# Patient Record
Sex: Male | Born: 1935
Health system: Southern US, Community
[De-identification: ages and names within clinical notes are randomized; demographics above are authoritative.]

## PROBLEM LIST (undated history)

## (undated) DIAGNOSIS — R413 Other amnesia: Secondary | ICD-10-CM

## (undated) DIAGNOSIS — I442 Atrioventricular block, complete: Secondary | ICD-10-CM

## (undated) DIAGNOSIS — J342 Deviated nasal septum: Secondary | ICD-10-CM

## (undated) DIAGNOSIS — Z952 Presence of prosthetic heart valve: Secondary | ICD-10-CM

## (undated) DIAGNOSIS — I350 Nonrheumatic aortic (valve) stenosis: Secondary | ICD-10-CM

## (undated) DIAGNOSIS — Z950 Presence of cardiac pacemaker: Secondary | ICD-10-CM

## (undated) DIAGNOSIS — E049 Nontoxic goiter, unspecified: Secondary | ICD-10-CM

## (undated) DIAGNOSIS — M1990 Unspecified osteoarthritis, unspecified site: Secondary | ICD-10-CM

## (undated) DIAGNOSIS — I4819 Other persistent atrial fibrillation: Secondary | ICD-10-CM

## (undated) DIAGNOSIS — E7800 Pure hypercholesterolemia, unspecified: Secondary | ICD-10-CM

## (undated) DIAGNOSIS — R251 Tremor, unspecified: Secondary | ICD-10-CM

## (undated) HISTORY — PX: TONSILLECTOMY: SUR1361

## (undated) HISTORY — DX: Deviated nasal septum: J34.2

## (undated) HISTORY — DX: Other persistent atrial fibrillation: I48.19

## (undated) HISTORY — DX: Nontoxic goiter, unspecified: E04.9

## (undated) HISTORY — DX: Pure hypercholesterolemia, unspecified: E78.00

## (undated) HISTORY — PX: CATARACT EXTRACTION W/ INTRAOCULAR LENS  IMPLANT, BILATERAL: SHX1307

## (undated) HISTORY — DX: Nonrheumatic aortic (valve) stenosis: I35.0

## (undated) HISTORY — PX: WISDOM TOOTH EXTRACTION: SHX21

## (undated) HISTORY — DX: Tremor, unspecified: R25.1

## (undated) HISTORY — DX: Other amnesia: R41.3

---

## 2004-12-08 ENCOUNTER — Ambulatory Visit (HOSPITAL_COMMUNITY): Admission: RE | Admit: 2004-12-08 | Discharge: 2004-12-08 | Payer: Self-pay | Admitting: Gastroenterology

## 2004-12-10 ENCOUNTER — Ambulatory Visit (HOSPITAL_COMMUNITY): Admission: RE | Admit: 2004-12-10 | Discharge: 2004-12-10 | Payer: Self-pay | Admitting: Internal Medicine

## 2004-12-29 ENCOUNTER — Other Ambulatory Visit: Admission: RE | Admit: 2004-12-29 | Discharge: 2004-12-29 | Payer: Self-pay | Admitting: Interventional Radiology

## 2004-12-29 ENCOUNTER — Encounter: Admission: RE | Admit: 2004-12-29 | Discharge: 2004-12-29 | Payer: Self-pay | Admitting: Internal Medicine

## 2004-12-29 ENCOUNTER — Encounter (INDEPENDENT_AMBULATORY_CARE_PROVIDER_SITE_OTHER): Payer: Self-pay | Admitting: *Deleted

## 2006-01-10 ENCOUNTER — Encounter: Admission: RE | Admit: 2006-01-10 | Discharge: 2006-01-10 | Payer: Self-pay | Admitting: Internal Medicine

## 2007-09-20 ENCOUNTER — Encounter: Admission: RE | Admit: 2007-09-20 | Discharge: 2007-09-20 | Payer: Self-pay | Admitting: Internal Medicine

## 2009-03-12 ENCOUNTER — Encounter: Admission: RE | Admit: 2009-03-12 | Discharge: 2009-03-12 | Payer: Self-pay | Admitting: Internal Medicine

## 2009-10-24 ENCOUNTER — Encounter: Admission: RE | Admit: 2009-10-24 | Discharge: 2009-10-24 | Payer: Self-pay | Admitting: Internal Medicine

## 2009-11-11 ENCOUNTER — Other Ambulatory Visit: Admission: RE | Admit: 2009-11-11 | Discharge: 2009-11-11 | Payer: Self-pay | Admitting: Interventional Radiology

## 2009-11-11 ENCOUNTER — Encounter: Admission: RE | Admit: 2009-11-11 | Discharge: 2009-11-11 | Payer: Self-pay | Admitting: Endocrinology

## 2009-11-24 ENCOUNTER — Encounter: Admission: RE | Admit: 2009-11-24 | Discharge: 2009-11-24 | Payer: Self-pay | Admitting: Internal Medicine

## 2010-10-24 ENCOUNTER — Other Ambulatory Visit: Payer: Self-pay | Admitting: Internal Medicine

## 2010-10-24 DIAGNOSIS — E049 Nontoxic goiter, unspecified: Secondary | ICD-10-CM

## 2010-10-25 ENCOUNTER — Encounter: Payer: Self-pay | Admitting: Internal Medicine

## 2010-11-09 ENCOUNTER — Inpatient Hospital Stay: Admission: RE | Admit: 2010-11-09 | Payer: Self-pay | Source: Ambulatory Visit

## 2010-11-20 ENCOUNTER — Ambulatory Visit
Admission: RE | Admit: 2010-11-20 | Discharge: 2010-11-20 | Disposition: A | Discharge status: Home / Self Care | Payer: Medicare Other | Source: Ambulatory Visit | Attending: Internal Medicine | Admitting: Internal Medicine

## 2010-11-20 DIAGNOSIS — E049 Nontoxic goiter, unspecified: Secondary | ICD-10-CM

## 2011-02-19 NOTE — Op Note (Signed)
NAMEJUNAID, Jeffery Rogers               ACCOUNT NO.:  [id number]   MEDICAL RECORD NO.:  [id number]          PATIENT TYPE:  AMB   LOCATION:  ENDO                         FACILITY:  [facility]   PHYSICIAN:  Petra Kuba, M.D.    DATE OF BIRTH:  [birth year]   DATE OF PROCEDURE:  12/08/2004  DATE OF DISCHARGE:                                 OPERATIVE REPORT   PROCEDURE PERFORMED:  Colonoscopy.   INDICATIONS:  Screening.   Consent was signed after risks, benefits, methods and options were  thoroughly discussed by both my nurse and me prior to any sedation.   MEDICINES USED:  Demerol 60, Versed 6.   PROCEDURE:  Rectal inspection is pertinent for external hemorrhoids, small.  Digital exam was negative.  The video pediatric adjustable colonoscope was  inserted, easily advanced around the colon to the cecum despite a long,  loopy tortuous colon.  We did roll him on his back and some abdominal  pressure was given.  No abnormalities were seen on insertion.  The cecum was  identified by the appendiceal orifice and the ileocecal valve.  No  abnormalities were seen on insertion.  The scope was slowly withdrawn.  The  prep was fairly adequate.  With washing and suctioning, adequate  visualization was obtained. When we felt back around the loop, we did try to  re-advance around the curves to decrease the chance of missing things but on  slow withdrawal back to the rectum, no abnormalities were seen, specifically  no polyps, tumors, masses, or diverticula.  Anorectal pull through on  retroflexion confirmed some small hemorrhoids.  The scope was straightened  and re-advanced a short ways up the left side of the colon.  Air was  suctioned.  The scope was removed.  The patient tolerated the procedure  well.  There was no obvious immediate complications.   ENDOSCOPIC DIAGNOSES:  1.  Internal and external hemorrhoids.  2.  Long, loopy, tortuous colon.  3.  Otherwise within normal limits to the cecum.   PLAN:  Consider repeat screening in five to 10 years.  Might even consider  upper endoscopy or colonoscopy if widely available and approved by insurance  at that time based on the tortuosity.  Happy to see back p.r.n. otherwise  return to the care of Dr. Renne Crigler.  Further recommendations to include yearly  rectal's and guaiac's.      MEM/MEDQ  D:  12/08/2004  T:  12/08/2004  Job:  [id number]   cc:   Soyla Murphy. Renne Crigler, M.D.  [address]  [place]  [place] [address]  Fax: [phone]

## 2011-06-14 ENCOUNTER — Ambulatory Visit: Payer: Medicare Other | Admitting: Family Medicine

## 2011-10-27 DIAGNOSIS — Z79899 Other long term (current) drug therapy: Secondary | ICD-10-CM | POA: Diagnosis not present

## 2011-11-17 DIAGNOSIS — E049 Nontoxic goiter, unspecified: Secondary | ICD-10-CM | POA: Diagnosis not present

## 2011-12-16 DIAGNOSIS — H2510 Age-related nuclear cataract, unspecified eye: Secondary | ICD-10-CM | POA: Diagnosis not present

## 2011-12-16 DIAGNOSIS — Z961 Presence of intraocular lens: Secondary | ICD-10-CM | POA: Diagnosis not present

## 2011-12-16 DIAGNOSIS — H538 Other visual disturbances: Secondary | ICD-10-CM | POA: Diagnosis not present

## 2011-12-20 DIAGNOSIS — H26499 Other secondary cataract, unspecified eye: Secondary | ICD-10-CM | POA: Diagnosis not present

## 2011-12-27 DIAGNOSIS — Z79899 Other long term (current) drug therapy: Secondary | ICD-10-CM | POA: Diagnosis not present

## 2011-12-27 DIAGNOSIS — E039 Hypothyroidism, unspecified: Secondary | ICD-10-CM | POA: Diagnosis not present

## 2011-12-27 DIAGNOSIS — E7800 Pure hypercholesterolemia, unspecified: Secondary | ICD-10-CM | POA: Diagnosis not present

## 2011-12-27 DIAGNOSIS — Z125 Encounter for screening for malignant neoplasm of prostate: Secondary | ICD-10-CM | POA: Diagnosis not present

## 2012-01-11 DIAGNOSIS — E049 Nontoxic goiter, unspecified: Secondary | ICD-10-CM | POA: Diagnosis not present

## 2012-01-11 DIAGNOSIS — I719 Aortic aneurysm of unspecified site, without rupture: Secondary | ICD-10-CM | POA: Diagnosis not present

## 2012-01-11 DIAGNOSIS — Z0000 Encounter for general adult medical examination without abnormal findings: Secondary | ICD-10-CM | POA: Diagnosis not present

## 2012-01-11 DIAGNOSIS — H6120 Impacted cerumen, unspecified ear: Secondary | ICD-10-CM | POA: Diagnosis not present

## 2012-01-18 ENCOUNTER — Other Ambulatory Visit: Payer: Self-pay | Admitting: Internal Medicine

## 2012-01-18 DIAGNOSIS — I719 Aortic aneurysm of unspecified site, without rupture: Secondary | ICD-10-CM

## 2012-01-20 DIAGNOSIS — I359 Nonrheumatic aortic valve disorder, unspecified: Secondary | ICD-10-CM | POA: Diagnosis not present

## 2012-01-25 ENCOUNTER — Ambulatory Visit
Admission: RE | Admit: 2012-01-25 | Discharge: 2012-01-25 | Disposition: A | Discharge status: Home / Self Care | Payer: Medicare Other | Source: Ambulatory Visit | Attending: Internal Medicine | Admitting: Internal Medicine

## 2012-01-25 DIAGNOSIS — N3289 Other specified disorders of bladder: Secondary | ICD-10-CM | POA: Diagnosis not present

## 2012-01-25 DIAGNOSIS — I719 Aortic aneurysm of unspecified site, without rupture: Secondary | ICD-10-CM

## 2012-02-01 DIAGNOSIS — I359 Nonrheumatic aortic valve disorder, unspecified: Secondary | ICD-10-CM | POA: Diagnosis not present

## 2012-05-29 ENCOUNTER — Other Ambulatory Visit: Payer: Self-pay | Admitting: Internal Medicine

## 2012-05-29 DIAGNOSIS — M79609 Pain in unspecified limb: Secondary | ICD-10-CM | POA: Diagnosis not present

## 2012-05-29 DIAGNOSIS — M79669 Pain in unspecified lower leg: Secondary | ICD-10-CM

## 2012-05-30 ENCOUNTER — Ambulatory Visit
Admission: RE | Admit: 2012-05-30 | Discharge: 2012-05-30 | Disposition: A | Discharge status: Home / Self Care | Payer: Medicare Other | Source: Ambulatory Visit | Attending: Internal Medicine | Admitting: Internal Medicine

## 2012-05-30 DIAGNOSIS — M79669 Pain in unspecified lower leg: Secondary | ICD-10-CM

## 2012-05-30 DIAGNOSIS — M79609 Pain in unspecified limb: Secondary | ICD-10-CM | POA: Diagnosis not present

## 2012-05-30 DIAGNOSIS — M7989 Other specified soft tissue disorders: Secondary | ICD-10-CM | POA: Diagnosis not present

## 2012-05-31 DIAGNOSIS — M545 Low back pain: Secondary | ICD-10-CM | POA: Diagnosis not present

## 2012-06-17 DIAGNOSIS — M5137 Other intervertebral disc degeneration, lumbosacral region: Secondary | ICD-10-CM | POA: Diagnosis not present

## 2012-06-19 DIAGNOSIS — M545 Low back pain: Secondary | ICD-10-CM | POA: Diagnosis not present

## 2012-06-19 DIAGNOSIS — S7000XA Contusion of unspecified hip, initial encounter: Secondary | ICD-10-CM | POA: Diagnosis not present

## 2012-06-26 DIAGNOSIS — IMO0002 Reserved for concepts with insufficient information to code with codable children: Secondary | ICD-10-CM | POA: Diagnosis not present

## 2012-06-26 DIAGNOSIS — M5126 Other intervertebral disc displacement, lumbar region: Secondary | ICD-10-CM | POA: Diagnosis not present

## 2012-07-19 DIAGNOSIS — H04129 Dry eye syndrome of unspecified lacrimal gland: Secondary | ICD-10-CM | POA: Diagnosis not present

## 2012-07-19 DIAGNOSIS — H35379 Puckering of macula, unspecified eye: Secondary | ICD-10-CM | POA: Diagnosis not present

## 2012-07-19 DIAGNOSIS — Z961 Presence of intraocular lens: Secondary | ICD-10-CM | POA: Diagnosis not present

## 2012-07-19 DIAGNOSIS — H26499 Other secondary cataract, unspecified eye: Secondary | ICD-10-CM | POA: Diagnosis not present

## 2012-07-31 DIAGNOSIS — M5137 Other intervertebral disc degeneration, lumbosacral region: Secondary | ICD-10-CM | POA: Diagnosis not present

## 2012-07-31 DIAGNOSIS — M545 Low back pain: Secondary | ICD-10-CM | POA: Diagnosis not present

## 2012-08-03 DIAGNOSIS — M545 Low back pain: Secondary | ICD-10-CM | POA: Diagnosis not present

## 2012-08-03 DIAGNOSIS — M5137 Other intervertebral disc degeneration, lumbosacral region: Secondary | ICD-10-CM | POA: Diagnosis not present

## 2012-08-14 DIAGNOSIS — M5137 Other intervertebral disc degeneration, lumbosacral region: Secondary | ICD-10-CM | POA: Diagnosis not present

## 2012-08-14 DIAGNOSIS — M545 Low back pain: Secondary | ICD-10-CM | POA: Diagnosis not present

## 2012-08-17 DIAGNOSIS — M5137 Other intervertebral disc degeneration, lumbosacral region: Secondary | ICD-10-CM | POA: Diagnosis not present

## 2012-08-17 DIAGNOSIS — M545 Low back pain: Secondary | ICD-10-CM | POA: Diagnosis not present

## 2012-08-28 DIAGNOSIS — Z23 Encounter for immunization: Secondary | ICD-10-CM | POA: Diagnosis not present

## 2012-12-21 ENCOUNTER — Other Ambulatory Visit: Payer: Self-pay | Admitting: Endocrinology

## 2012-12-21 DIAGNOSIS — E049 Nontoxic goiter, unspecified: Secondary | ICD-10-CM | POA: Diagnosis not present

## 2012-12-22 ENCOUNTER — Ambulatory Visit
Admission: RE | Admit: 2012-12-22 | Discharge: 2012-12-22 | Disposition: A | Discharge status: Home / Self Care | Payer: Medicare Other | Source: Ambulatory Visit | Attending: Endocrinology | Admitting: Endocrinology

## 2012-12-22 DIAGNOSIS — E042 Nontoxic multinodular goiter: Secondary | ICD-10-CM | POA: Diagnosis not present

## 2012-12-22 DIAGNOSIS — E049 Nontoxic goiter, unspecified: Secondary | ICD-10-CM

## 2013-01-11 DIAGNOSIS — E049 Nontoxic goiter, unspecified: Secondary | ICD-10-CM | POA: Diagnosis not present

## 2013-01-16 DIAGNOSIS — E7800 Pure hypercholesterolemia, unspecified: Secondary | ICD-10-CM | POA: Diagnosis not present

## 2013-01-16 DIAGNOSIS — Z125 Encounter for screening for malignant neoplasm of prostate: Secondary | ICD-10-CM | POA: Diagnosis not present

## 2013-01-16 DIAGNOSIS — Z7982 Long term (current) use of aspirin: Secondary | ICD-10-CM | POA: Diagnosis not present

## 2013-01-16 DIAGNOSIS — I359 Nonrheumatic aortic valve disorder, unspecified: Secondary | ICD-10-CM | POA: Diagnosis not present

## 2013-01-16 DIAGNOSIS — H6120 Impacted cerumen, unspecified ear: Secondary | ICD-10-CM | POA: Diagnosis not present

## 2013-01-16 DIAGNOSIS — Z1212 Encounter for screening for malignant neoplasm of rectum: Secondary | ICD-10-CM | POA: Diagnosis not present

## 2013-01-16 DIAGNOSIS — Z23 Encounter for immunization: Secondary | ICD-10-CM | POA: Diagnosis not present

## 2013-01-16 DIAGNOSIS — Z923 Personal history of irradiation: Secondary | ICD-10-CM | POA: Diagnosis not present

## 2013-01-16 DIAGNOSIS — E049 Nontoxic goiter, unspecified: Secondary | ICD-10-CM | POA: Diagnosis not present

## 2013-02-15 ENCOUNTER — Ambulatory Visit: Payer: Medicare Other | Admitting: Cardiovascular Disease

## 2013-02-22 ENCOUNTER — Ambulatory Visit: Payer: Medicare Other | Admitting: Cardiovascular Disease

## 2013-03-29 ENCOUNTER — Encounter: Payer: Self-pay | Admitting: Cardiovascular Disease

## 2013-03-29 ENCOUNTER — Encounter: Payer: Self-pay | Admitting: *Deleted

## 2013-03-29 ENCOUNTER — Ambulatory Visit (INDEPENDENT_AMBULATORY_CARE_PROVIDER_SITE_OTHER): Payer: Medicare Other | Admitting: Cardiovascular Disease

## 2013-03-29 VITALS — BP 140/80 | HR 55 | Wt 186.0 lb

## 2013-03-29 DIAGNOSIS — J342 Deviated nasal septum: Secondary | ICD-10-CM | POA: Insufficient documentation

## 2013-03-29 DIAGNOSIS — R011 Cardiac murmur, unspecified: Secondary | ICD-10-CM

## 2013-03-29 DIAGNOSIS — I351 Nonrheumatic aortic (valve) insufficiency: Secondary | ICD-10-CM | POA: Insufficient documentation

## 2013-03-29 DIAGNOSIS — E7800 Pure hypercholesterolemia, unspecified: Secondary | ICD-10-CM

## 2013-03-29 DIAGNOSIS — I440 Atrioventricular block, first degree: Secondary | ICD-10-CM | POA: Insufficient documentation

## 2013-03-29 DIAGNOSIS — R0683 Snoring: Secondary | ICD-10-CM | POA: Insufficient documentation

## 2013-03-29 DIAGNOSIS — E049 Nontoxic goiter, unspecified: Secondary | ICD-10-CM | POA: Insufficient documentation

## 2013-03-29 DIAGNOSIS — H6120 Impacted cerumen, unspecified ear: Secondary | ICD-10-CM | POA: Insufficient documentation

## 2013-03-29 NOTE — Assessment & Plan Note (Signed)
Cholesterol is at goal.  Continue current dose of statin and diet Rx.  No myalgias or side effects.  F/U  LFT's in 6 months. No results found for this basename: LDLCALC   Labs with GMA;s

## 2013-03-29 NOTE — Patient Instructions (Addendum)
Your physician recommends that you schedule a follow-up appointment in: 6 MONTHS WITH DR [facility] Your physician recommends that you continue on your current medications as directed. Please refer to the Current Medication list given to you today.  Your physician has requested that you have an echocardiogram. Echocardiography is a painless test that uses sound waves to create images of your heart. It provides your doctor with information about the size and shape of your heart and how well your heart's chambers and valves are working. This procedure takes approximately one hour. There are no restrictions for this procedure.

## 2013-03-29 NOTE — Assessment & Plan Note (Signed)
Appears to have mild to moderate AS on exam F/U echo No need for SBE

## 2013-03-29 NOTE — Progress Notes (Signed)
Patient ID: Jeffery Rogers, male   DOB: [birth year], 79 y.o.   MRN: [id number] 79 yo referred by Dr Renne Crigler for murmur.  Distant history of murmur and GMAls notes indicate history of AR.  He has not had echo in a while. He is active at [employer] [employer].  Runs relatively bradycardic.  He has a heart rate monitor and can get his HR over 130 with exercise. No chest pain dyspnea syncope or palpitations.  No fever or stigmata of SBE.  On exam today he had a mild to moderate AS murmur AR not heard Discussed diagnosis and natural history with him and his son who is visiting from [place].  Needs f/u echo  ROS: Denies fever, malais, weight loss, blurry vision, decreased visual acuity, cough, sputum, SOB, hemoptysis, pleuritic pain, palpitaitons, heartburn, abdominal pain, melena, lower extremity edema, claudication, or rash.  All other systems reviewed and negative   General: Affect appropriate Healthy:  appears stated age HEENT: normal Neck supple with no adenopathy JVP normal no bruits no thyromegaly Lungs clear with no wheezing and good diaphragmatic motion Heart:  S1/S2 preserved AS  murmur,rub, gallop or click PMI normal Abdomen: benighn, BS positve, no tenderness, no AAA no bruit.  No HSM or HJR Distal pulses intact with no bruits No edema Neuro non-focal Skin warm and dry No muscular weakness  Medications Current Outpatient Prescriptions  Medication Sig Dispense Refill  . aspirin 81 MG tablet Take 81 mg by mouth daily.      Marland Kitchen atorvastatin (LIPITOR) 40 MG tablet Take 1 tablet by mouth daily.      . cetirizine (ZYRTEC) 10 MG tablet Take 10 mg by mouth as needed for allergies.       No current facility-administered medications for this visit.    Allergies Review of patient's allergies indicates no known allergies.  Family History: Family History  Problem Relation Age of Onset  . Stroke    . Irregular heart beat      Social History: History   Social History  . Marital Status: Married    Spouse  Name: N/A    Number of Children: N/A  . Years of Education: N/A   Occupational History  . Not on file.   Social History Main Topics  . Smoking status: Not on file  . Smokeless tobacco: Not on file  . Alcohol Use: Not on file  . Drug Use: Not on file  . Sexually Active: Not on file   Other Topics Concern  . Not on file   Social History Narrative  . No narrative on file    Electrocardiogram: SB rate 46 LAD PR 296   Assessment and Plan

## 2013-03-29 NOTE — Assessment & Plan Note (Signed)
Relative bradycardia with long PR Asymptomatic Patient wears HR monitor when he works out/ walks and no chronotropic incompetence  F/U ECG yearly and avoid AV nodal blocking drugs

## 2013-04-10 ENCOUNTER — Ambulatory Visit (HOSPITAL_COMMUNITY): Payer: Medicare Other | Attending: Cardiology | Admitting: Radiology

## 2013-04-10 DIAGNOSIS — I059 Rheumatic mitral valve disease, unspecified: Secondary | ICD-10-CM | POA: Diagnosis not present

## 2013-04-10 DIAGNOSIS — R011 Cardiac murmur, unspecified: Secondary | ICD-10-CM | POA: Diagnosis not present

## 2013-04-10 DIAGNOSIS — I440 Atrioventricular block, first degree: Secondary | ICD-10-CM | POA: Insufficient documentation

## 2013-04-10 DIAGNOSIS — I379 Nonrheumatic pulmonary valve disorder, unspecified: Secondary | ICD-10-CM | POA: Insufficient documentation

## 2013-04-10 DIAGNOSIS — E785 Hyperlipidemia, unspecified: Secondary | ICD-10-CM | POA: Insufficient documentation

## 2013-04-10 DIAGNOSIS — I079 Rheumatic tricuspid valve disease, unspecified: Secondary | ICD-10-CM | POA: Diagnosis not present

## 2013-04-10 NOTE — Progress Notes (Signed)
Echocardiogram performed.  

## 2013-07-16 DIAGNOSIS — E7800 Pure hypercholesterolemia, unspecified: Secondary | ICD-10-CM | POA: Diagnosis not present

## 2013-07-19 DIAGNOSIS — E7800 Pure hypercholesterolemia, unspecified: Secondary | ICD-10-CM | POA: Diagnosis not present

## 2013-07-20 DIAGNOSIS — Z23 Encounter for immunization: Secondary | ICD-10-CM | POA: Diagnosis not present

## 2013-09-06 ENCOUNTER — Ambulatory Visit (INDEPENDENT_AMBULATORY_CARE_PROVIDER_SITE_OTHER): Payer: Medicare Other | Admitting: Physician Assistant

## 2013-09-06 ENCOUNTER — Ambulatory Visit: Payer: Medicare Other | Admitting: Physician Assistant

## 2013-09-06 ENCOUNTER — Encounter: Payer: Self-pay | Admitting: Physician Assistant

## 2013-09-06 VITALS — BP 132/78 | HR 57 | Ht 71.0 in | Wt 185.0 lb

## 2013-09-06 DIAGNOSIS — I059 Rheumatic mitral valve disease, unspecified: Secondary | ICD-10-CM

## 2013-09-06 DIAGNOSIS — I359 Nonrheumatic aortic valve disorder, unspecified: Secondary | ICD-10-CM

## 2013-09-06 DIAGNOSIS — I440 Atrioventricular block, first degree: Secondary | ICD-10-CM | POA: Diagnosis not present

## 2013-09-06 DIAGNOSIS — E7800 Pure hypercholesterolemia, unspecified: Secondary | ICD-10-CM

## 2013-09-06 DIAGNOSIS — I351 Nonrheumatic aortic (valve) insufficiency: Secondary | ICD-10-CM

## 2013-09-06 NOTE — Progress Notes (Signed)
[address] [place], [place]  [address] Phone: [phone] Fax:  [phone]  Date:  09/06/2013   ID:  Jeffery Rogers, DOB 1932-04-26, MRN [id number]  PCP:  Londell Moh, MD  Cardiologist:  Dr. Charlton Haws     History of Present Illness: Jeffery Rogers is a 79 y.o. male with a hx of 1st degree AVB, HL and mild valvular heart disease.  Evaluated by Dr. Charlton Haws 03/2013.  Echo (04/2013):  Mild LVH, EF 55-60%, Gr 1 DD, mild AS (mean 21 mmHg), mild AI, MAC, mild MR, mild LAE, mild RVE, mod RAE, PASP 39.    He continues to do very well.  The patient denies chest pain, shortness of breath, syncope, orthopnea, PND or significant pedal edema. He works out at the [employer] per week.  His energy level is good.   Recent Labs: No results found for requested labs within last 365 days.  Wt Readings from Last 3 Encounters:  09/06/13 185 lb (83.915 kg)  03/29/13 186 lb (84.369 kg)     Past Medical History  Diagnosis Date  . Hypercholesteremia   . Aortic insufficiency     Echo (04/2013):  Mild LVH, EF 55-60%, Gr 1 DD, mild AS (mean 21 mmHg), mild AI, MAC, mild MR, mild LAE, mild RVE, mod RAE, PASP 39  . Goiter   . Nasal septal deviation   . Cerumen impaction   . Snoring   . Aortic stenosis     Current Outpatient Prescriptions  Medication Sig Dispense Refill  . aspirin 81 MG tablet Take 81 mg by mouth daily.      Marland Kitchen atorvastatin (LIPITOR) 40 MG tablet Take 1 tablet by mouth daily.      . cetirizine (ZYRTEC) 10 MG tablet Take 10 mg by mouth as needed for allergies.      Marland Kitchen neomycin-polymyxin-hydrocortisone (CORTISPORIN) 3.5-10000-1 otic suspension Ear drops       No current facility-administered medications for this visit.    Allergies:   Review of patient's allergies indicates no known allergies.   Social History:  The patient  reports that he has quit smoking. He does not have any smokeless tobacco history on file.   Family History:  The patient's family history  includes Irregular heart beat in an other family member; Stroke in an other family member.   ROS:  Please see the history of present illness.      All other systems reviewed and negative.   PHYSICAL EXAM: VS:  BP 132/78  Pulse 57  Ht 5\' 11"  (1.803 m)  Wt 185 lb (83.915 kg)  BMI 25.81 kg/m2 Well nourished, well developed, in no acute distress HEENT: normal Neck: no JVD Cardiac:  normal S1, S2; RRR; 2/6 systolic murmur at RUSB and along LLSB Lungs:  clear to auscultation bilaterally, no wheezing, rhonchi or rales Abd: soft, nontender, no hepatomegaly Ext: no edema Skin: warm and dry Neuro:  CNs 2-12 intact, no focal abnormalities noted  EKG:  Sinus bradycardia, HR 57, LAD, first degree AV block (PR 260 ms), no change from prior tracings     ASSESSMENT AND PLAN:  1. Aortic Valve Disease:  Mild AI and mild AS by recent echo.  He is asymptomatic.  Plan f/u echo in 04/2014. 2. Hyperlipidemia:  Managed by PCP.  3. 1st Degree AV Block:  Stable.  No symptoms to suggest chronotropic incompetence.  4. Disposition:  F/u with Dr. Charlton Haws after echo in 04/2014.  Signed,  Tereso Newcomer, PA-C  09/06/2013 8:46 AM

## 2013-09-06 NOTE — Patient Instructions (Signed)
Your physician has requested that you have an echocardiogram TO BE DONE 04/2014. Echocardiography is a painless test that uses sound waves to create images of your heart. It provides your doctor with information about the size and shape of your heart and how well your heart's chambers and valves are working. This procedure takes approximately one hour. There are no restrictions for this procedure.  Your physician wants you to follow-up in: WITH DR Eden Emms IN 05/2014. You will receive a reminder letter in the mail two months in advance. If you don't receive a letter, please call our office to schedule the follow-up appointment.

## 2013-11-08 DIAGNOSIS — H6120 Impacted cerumen, unspecified ear: Secondary | ICD-10-CM | POA: Diagnosis not present

## 2013-11-08 DIAGNOSIS — H60399 Other infective otitis externa, unspecified ear: Secondary | ICD-10-CM | POA: Diagnosis not present

## 2013-11-10 IMAGING — US US AORTA
1 series · 14 of 25 positions shown · non-contrast
Comparison: None.

CLINICAL DATA: Possible abdominal aortic aneurysm on physical
exam.

ULTRASOUND OF ABDOMINAL AORTA
TECHNIQUE: Ultrasound examination of the abdominal aorta was
performed to evaluate for abdominal aortic aneurysm.

[Series 1: us aorta · 0.33mm/px · 14 of 29 slices shown]
[im 1/29]
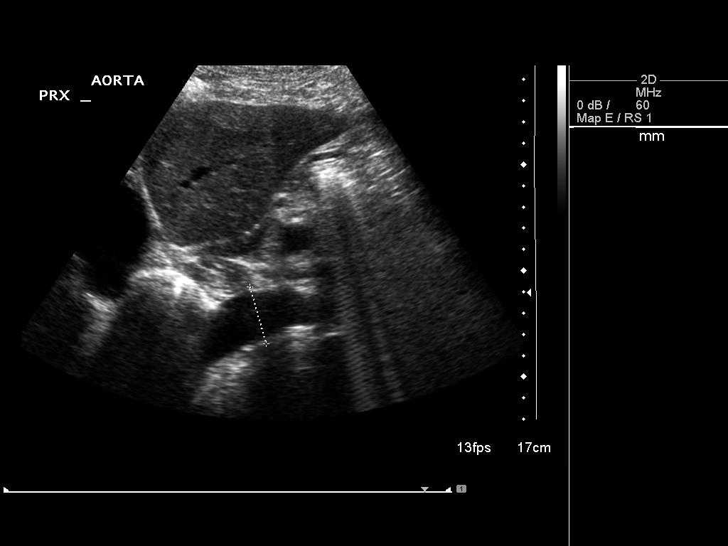
[im 3/29]
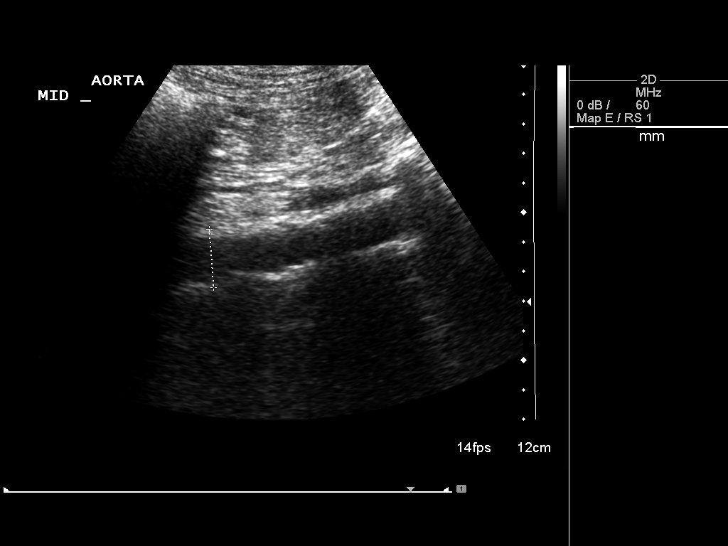
[im 5/29]
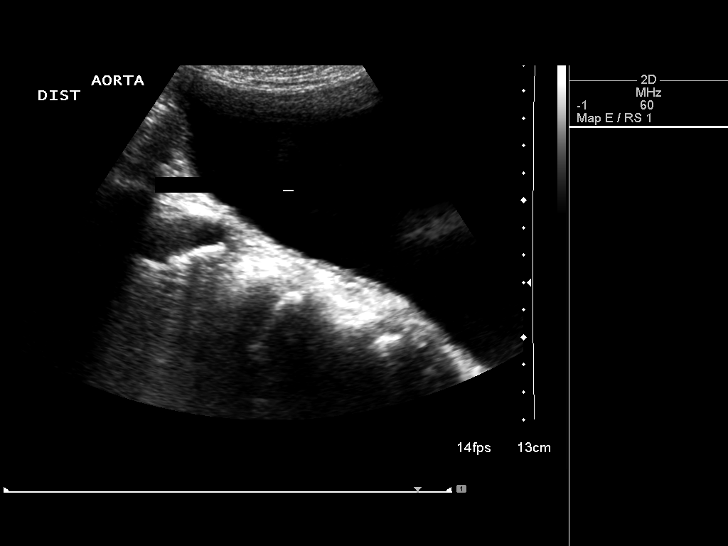
[im 8/29]
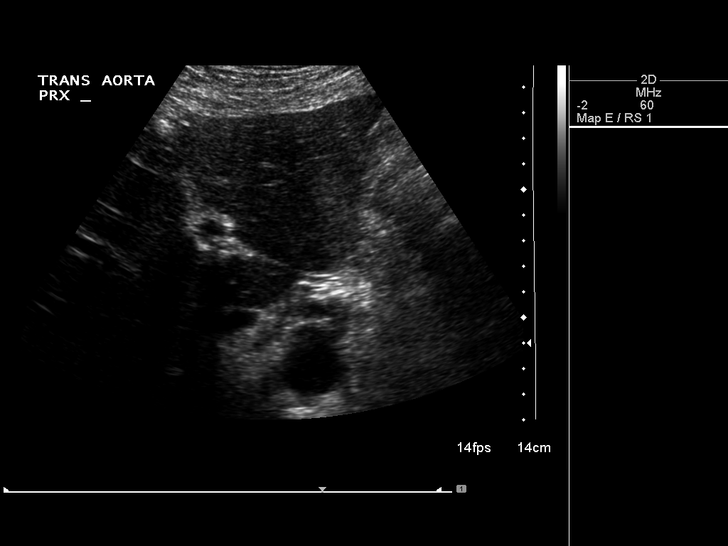
[im 10/29]
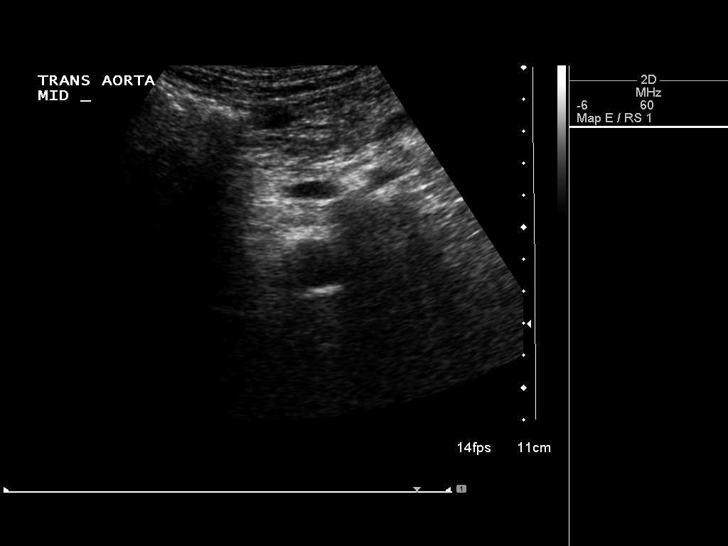
[im 11/29]
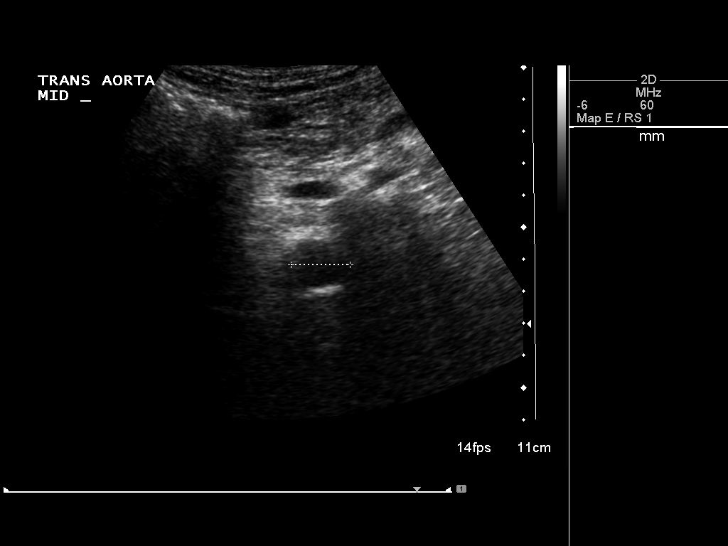
[im 13/29]
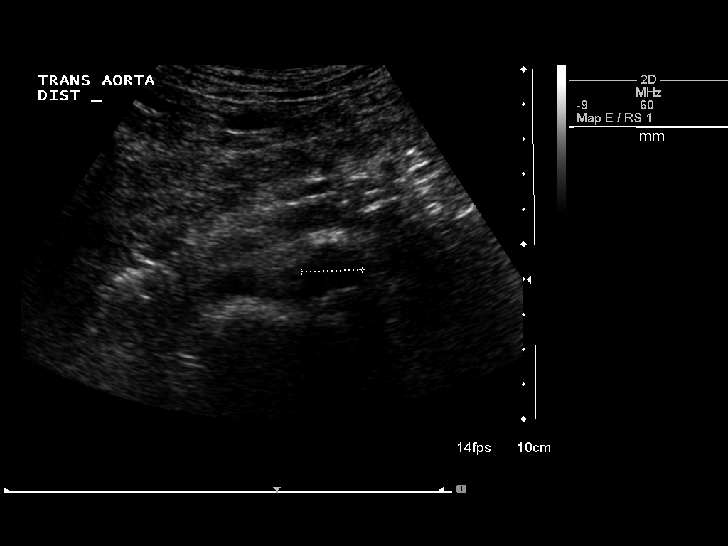
[im 16/29]
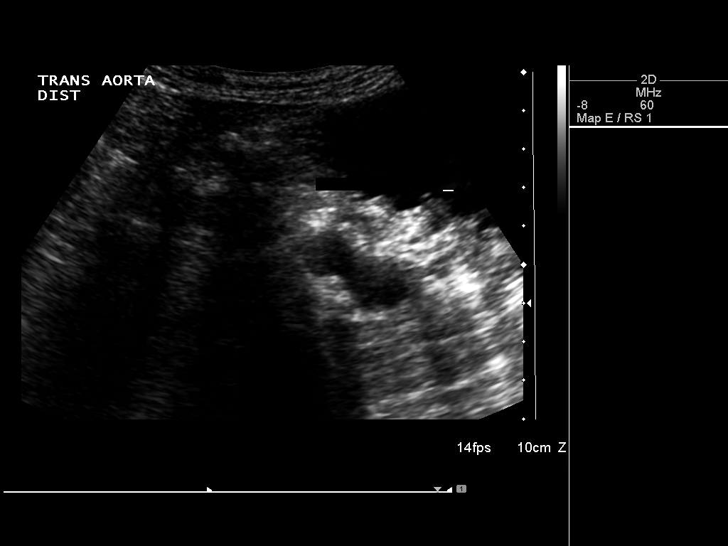
[im 18/29]
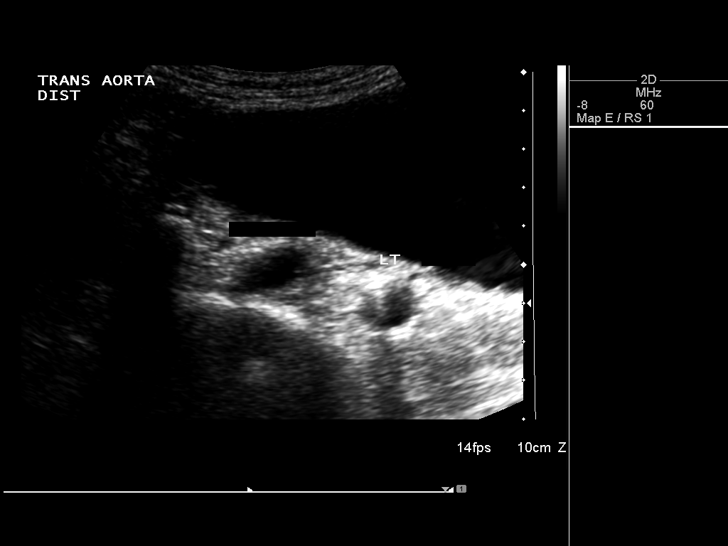
[im 19/29]
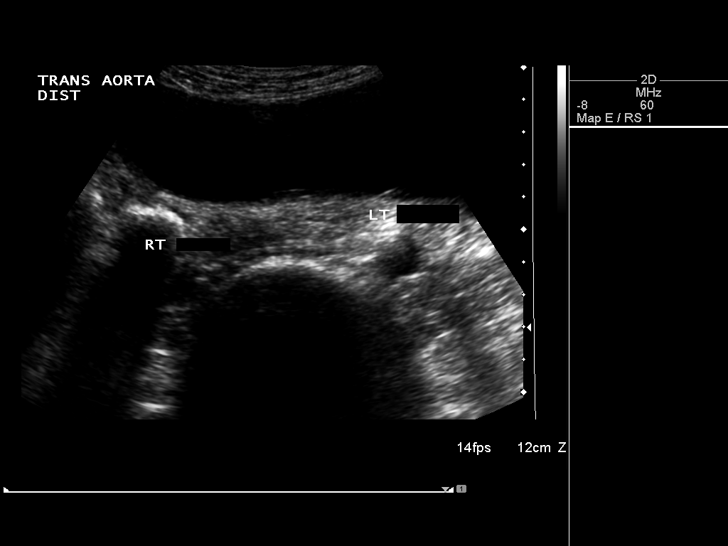
[im 22/29]
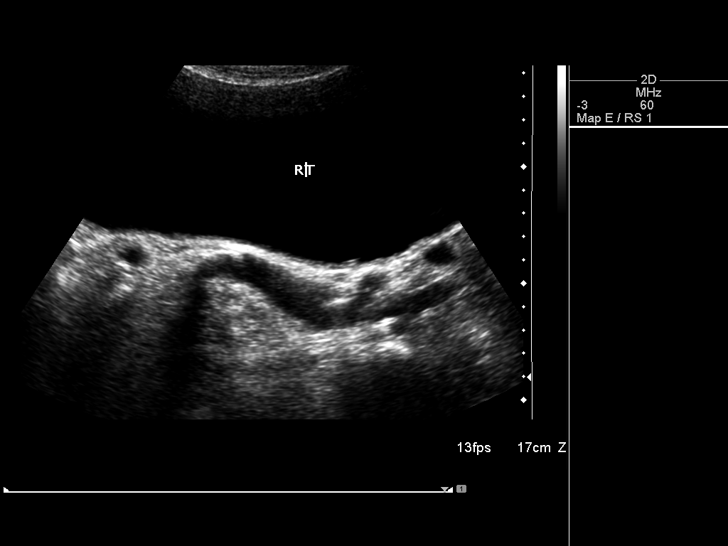
[im 24/29]
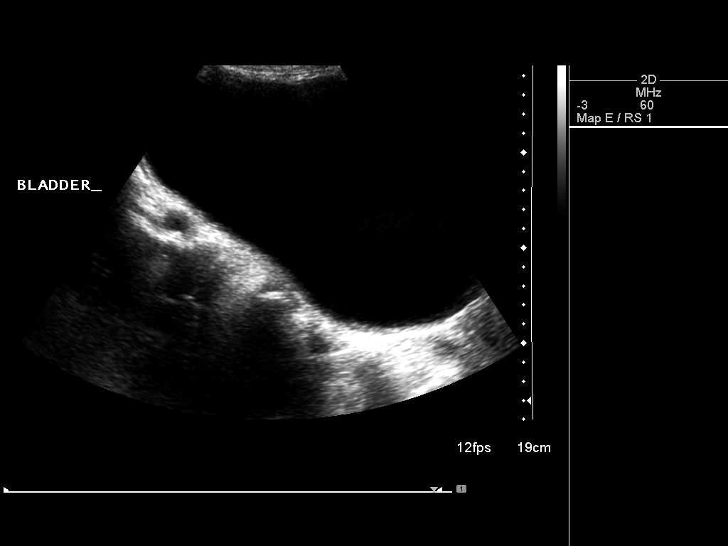
[im 26/29]
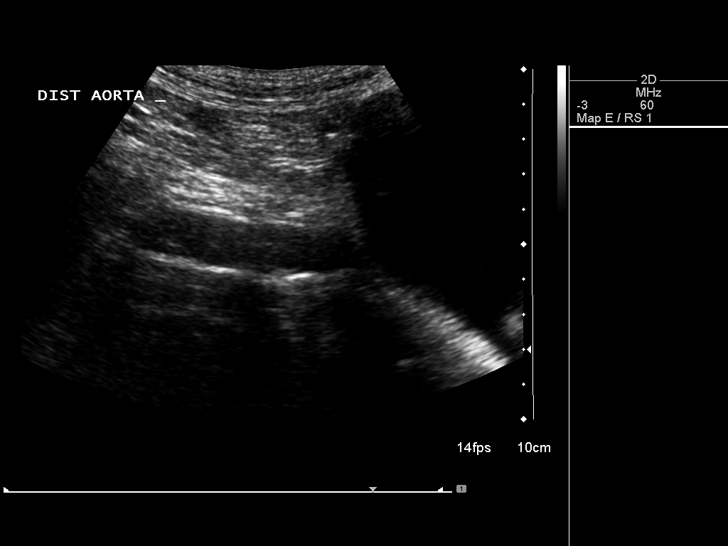
[im 29/29]
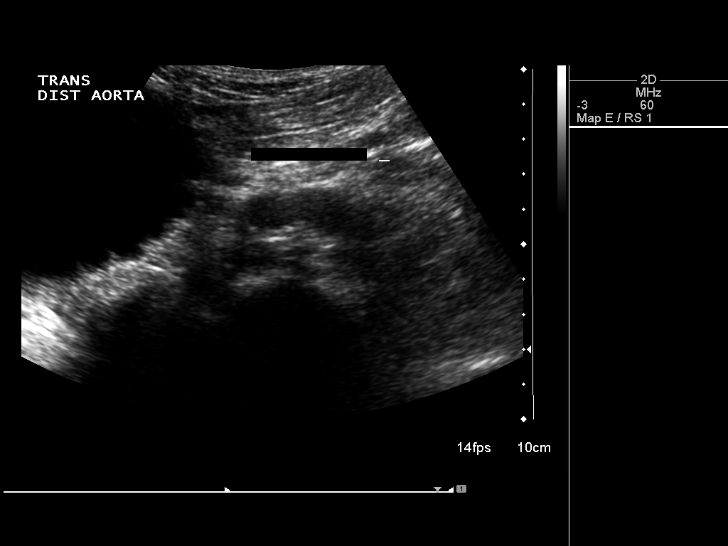

[14 of 25 positions shown; findings below may reference images not displayed]

Abdominal Aorta:  No aneurysm identified.  The proximal aorta
measures 2.8 cm and tapers distally.

      Maximum AP diameter:  2.8 cm.
      Maximum TRV diameter:  2.6 cm.

The common iliac arteries are normal in caliber.

Incidental note is made of bladder distention, moderate to marked
in severity.
IMPRESSION: 1.  No evidence of abdominal aortic aneurysm.
2.  Bladder distention.  The patient denies feeling of fullness.
Correlate with any symptoms of bladder outlet obstruction or
neurogenic bladder.

## 2014-01-30 DIAGNOSIS — I359 Nonrheumatic aortic valve disorder, unspecified: Secondary | ICD-10-CM | POA: Diagnosis not present

## 2014-01-30 DIAGNOSIS — E7800 Pure hypercholesterolemia, unspecified: Secondary | ICD-10-CM | POA: Diagnosis not present

## 2014-01-30 DIAGNOSIS — J309 Allergic rhinitis, unspecified: Secondary | ICD-10-CM | POA: Diagnosis not present

## 2014-01-30 DIAGNOSIS — Z125 Encounter for screening for malignant neoplasm of prostate: Secondary | ICD-10-CM | POA: Diagnosis not present

## 2014-01-30 DIAGNOSIS — E049 Nontoxic goiter, unspecified: Secondary | ICD-10-CM | POA: Diagnosis not present

## 2014-01-30 DIAGNOSIS — Z7982 Long term (current) use of aspirin: Secondary | ICD-10-CM | POA: Diagnosis not present

## 2014-03-14 DIAGNOSIS — H6120 Impacted cerumen, unspecified ear: Secondary | ICD-10-CM | POA: Diagnosis not present

## 2014-03-16 IMAGING — US US EXTREM LOW VENOUS*L*
1 series · 14 of 24 positions shown · non-contrast
Comparison: 11/24/2009.

CLINICAL DATA: Left lower extremity pain and swelling.

LEFT LOWER EXTREMITY VENOUS DUPLEX ULTRASOUND
TECHNIQUE: Gray-scale sonography with graded compression, as well
as color Doppler and duplex ultrasound, were performed to evaluate
the deep venous system of the lower extremity from the level of the
common femoral vein through the popliteal and proximal calf veins.
Spectral Doppler was utilized to evaluate flow at rest and with
distal augmentation maneuvers.

[Series 1: us extrem low venous*left* · 14 of 34 slices shown]
[im 1/34]
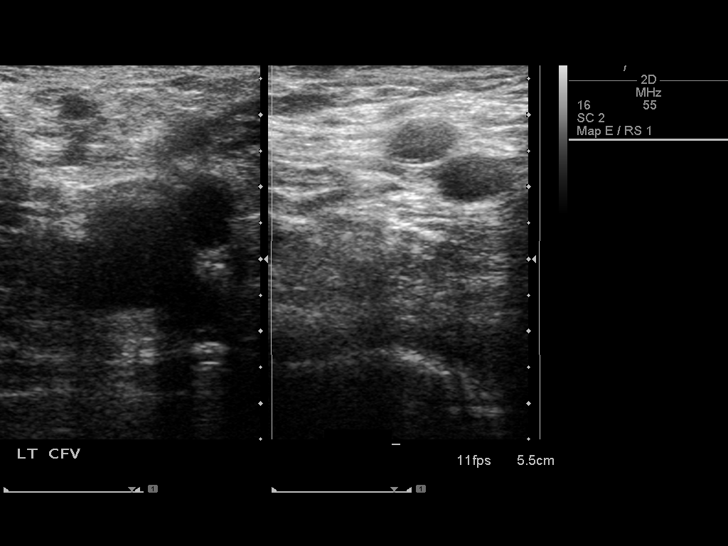
[im 3/34]
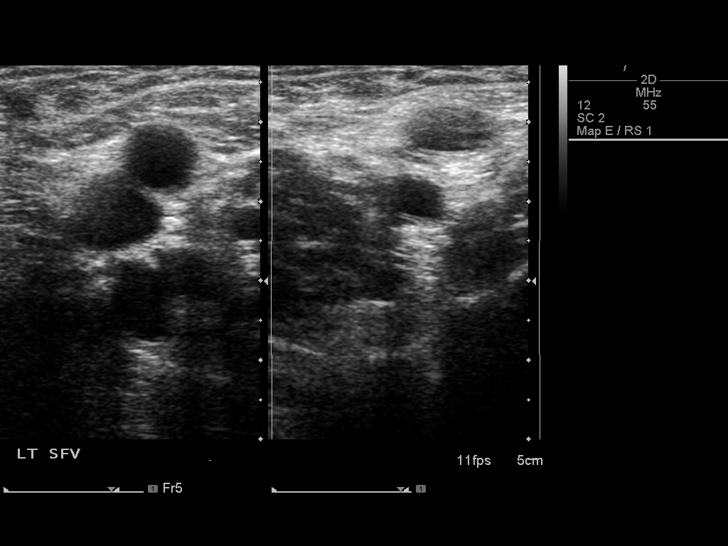
[im 6/34]
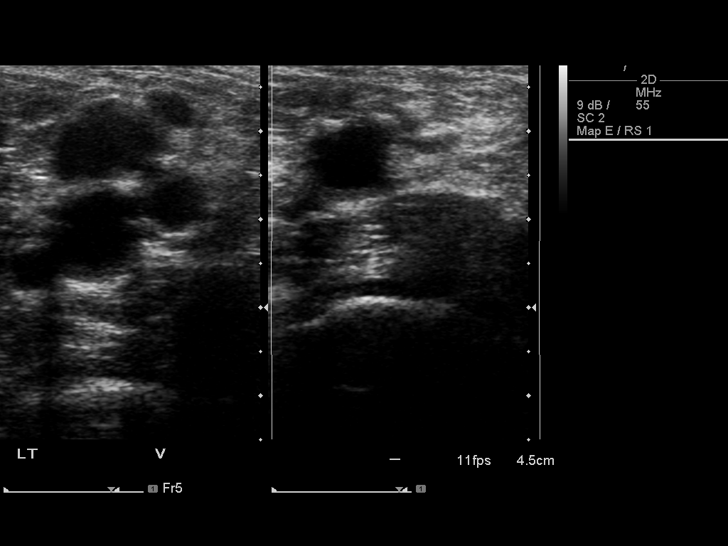
[im 9/34]
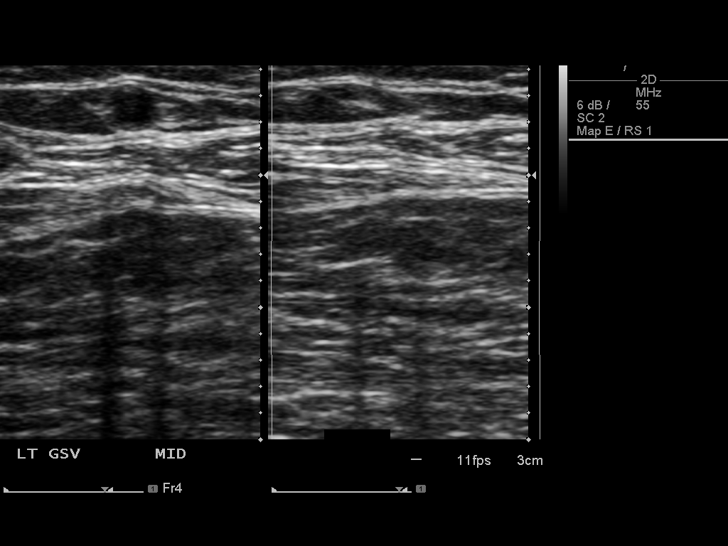
[im 11/34]
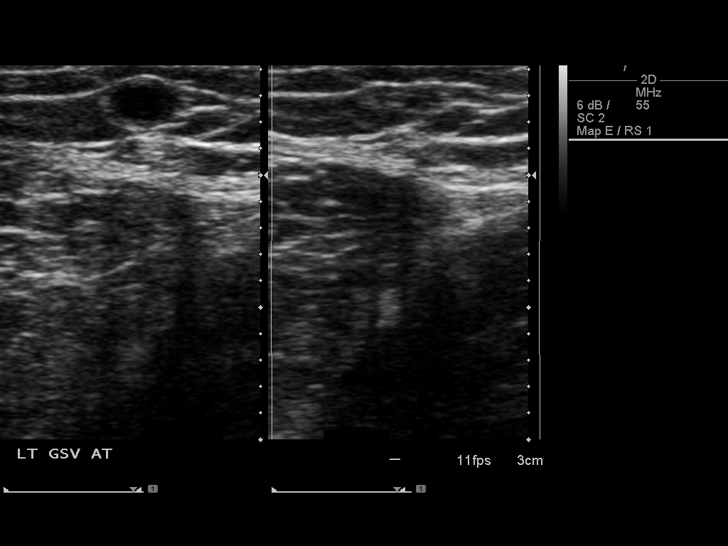
[im 13/34]
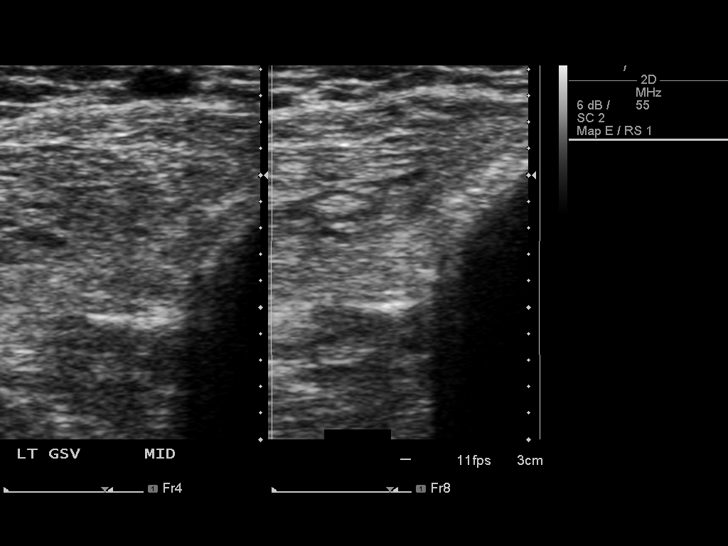
[im 16/34]
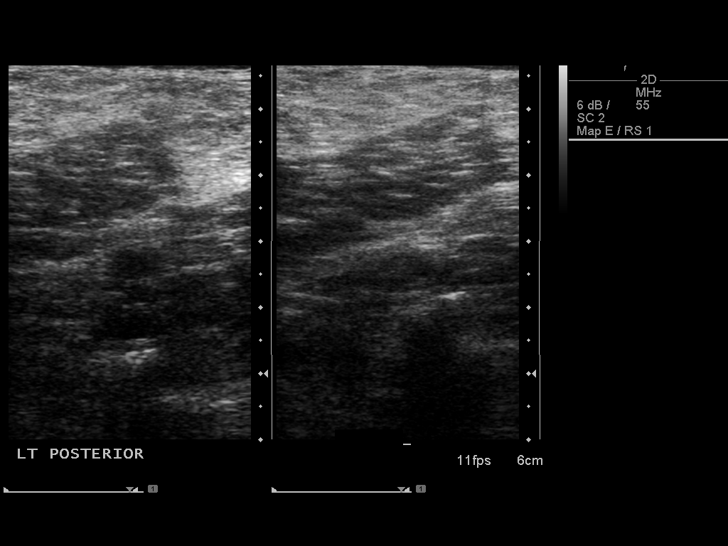
[im 18/34]
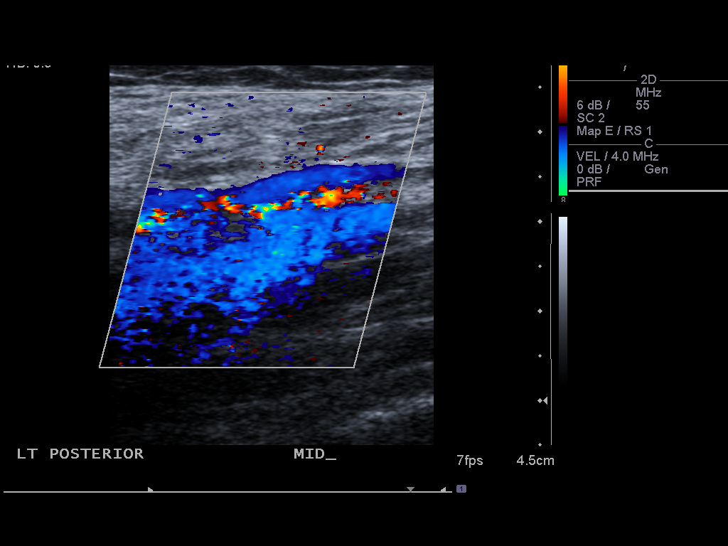
[im 21/34]
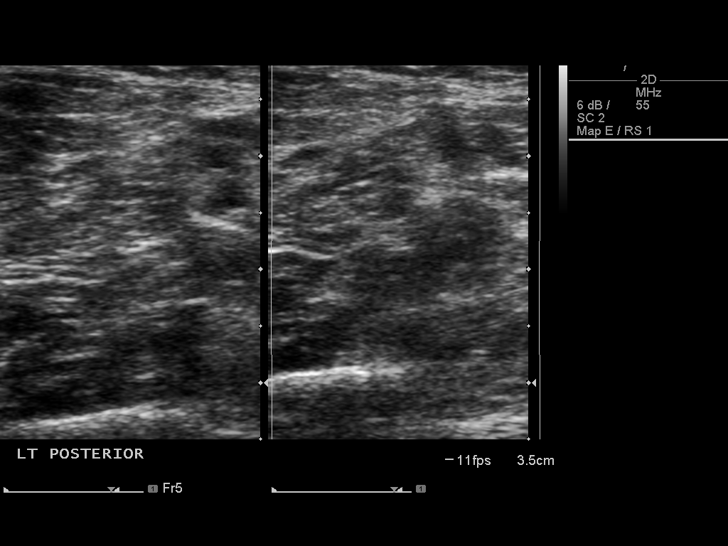
[im 23/34]
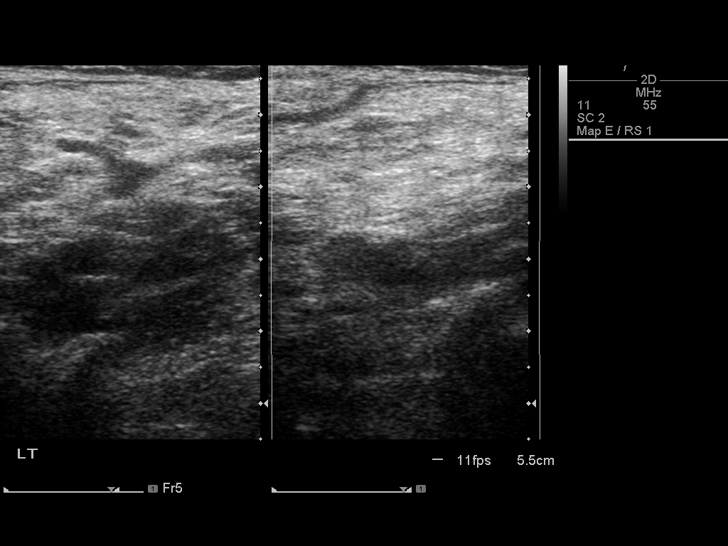
[im 26/34]
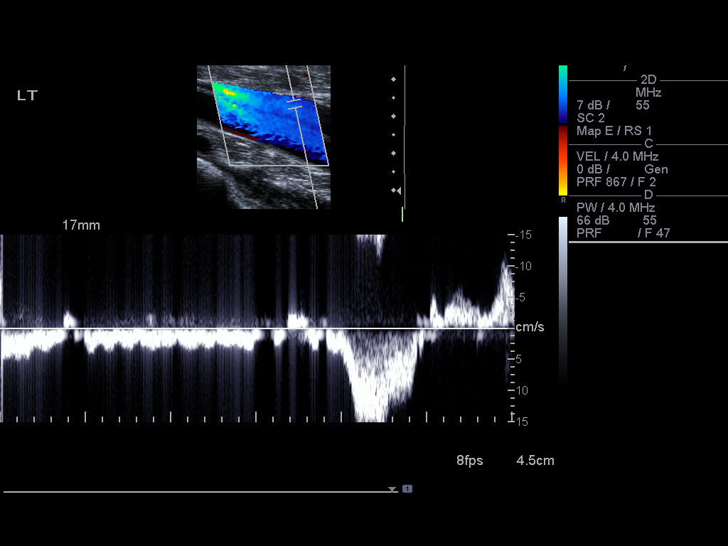
[im 28/34]
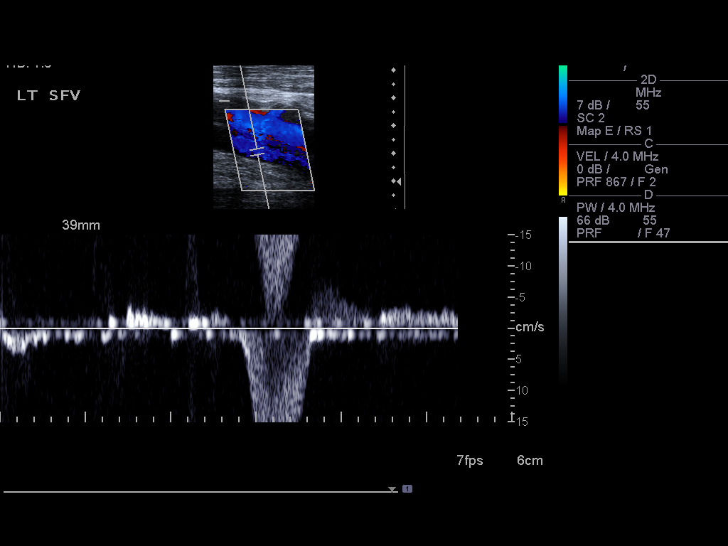
[im 31/34]
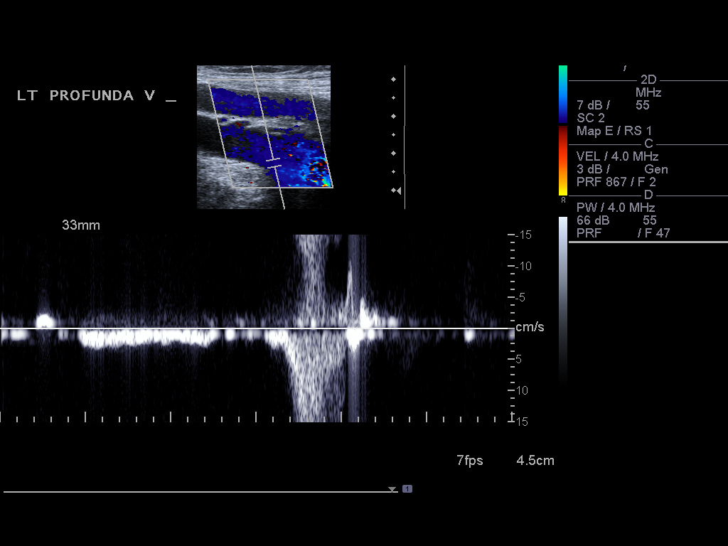
[im 34/34]
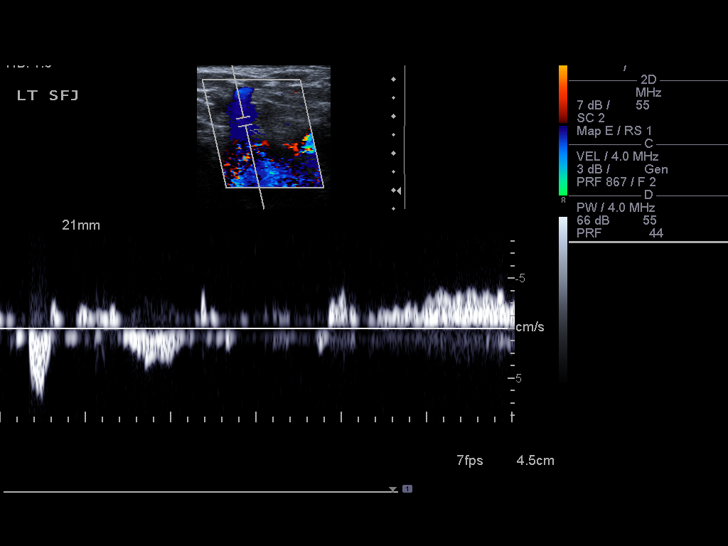

[14 of 24 positions shown; findings below may reference images not displayed]

FINDINGS: There is patent flow in the common femoral, profunda
femoral, superficial femoral and popliteal veins.  These vessels
were completely compressible and demonstrate increased flow with
augmentation.

No findings for superficial thrombophlebitis.
IMPRESSION: Negative venous Doppler examination for deep venous thrombosis.

## 2014-04-01 DIAGNOSIS — D235 Other benign neoplasm of skin of trunk: Secondary | ICD-10-CM | POA: Diagnosis not present

## 2014-04-01 DIAGNOSIS — L820 Inflamed seborrheic keratosis: Secondary | ICD-10-CM | POA: Diagnosis not present

## 2014-04-01 DIAGNOSIS — L570 Actinic keratosis: Secondary | ICD-10-CM | POA: Diagnosis not present

## 2014-04-11 DIAGNOSIS — H04129 Dry eye syndrome of unspecified lacrimal gland: Secondary | ICD-10-CM | POA: Diagnosis not present

## 2014-04-11 DIAGNOSIS — H35379 Puckering of macula, unspecified eye: Secondary | ICD-10-CM | POA: Diagnosis not present

## 2014-04-11 DIAGNOSIS — H35039 Hypertensive retinopathy, unspecified eye: Secondary | ICD-10-CM | POA: Diagnosis not present

## 2014-04-11 DIAGNOSIS — Z961 Presence of intraocular lens: Secondary | ICD-10-CM | POA: Diagnosis not present

## 2014-04-22 ENCOUNTER — Ambulatory Visit (HOSPITAL_COMMUNITY): Payer: Medicare Other | Attending: Cardiovascular Disease | Admitting: Radiology

## 2014-04-22 ENCOUNTER — Telehealth: Payer: Self-pay | Admitting: *Deleted

## 2014-04-22 ENCOUNTER — Encounter: Payer: Self-pay | Admitting: Physician Assistant

## 2014-04-22 DIAGNOSIS — I440 Atrioventricular block, first degree: Secondary | ICD-10-CM | POA: Diagnosis not present

## 2014-04-22 DIAGNOSIS — I379 Nonrheumatic pulmonary valve disorder, unspecified: Secondary | ICD-10-CM | POA: Diagnosis not present

## 2014-04-22 DIAGNOSIS — I079 Rheumatic tricuspid valve disease, unspecified: Secondary | ICD-10-CM | POA: Insufficient documentation

## 2014-04-22 DIAGNOSIS — I517 Cardiomegaly: Secondary | ICD-10-CM | POA: Insufficient documentation

## 2014-04-22 DIAGNOSIS — E785 Hyperlipidemia, unspecified: Secondary | ICD-10-CM | POA: Insufficient documentation

## 2014-04-22 DIAGNOSIS — I359 Nonrheumatic aortic valve disorder, unspecified: Secondary | ICD-10-CM | POA: Diagnosis not present

## 2014-04-22 DIAGNOSIS — I059 Rheumatic mitral valve disease, unspecified: Secondary | ICD-10-CM | POA: Insufficient documentation

## 2014-04-22 DIAGNOSIS — I351 Nonrheumatic aortic (valve) insufficiency: Secondary | ICD-10-CM

## 2014-04-22 NOTE — Progress Notes (Signed)
Echocardiogram performed.  

## 2014-04-22 NOTE — Telephone Encounter (Signed)
lvm with pt's wife who asked if I would cb tomorrow and give the results to the pt, she said he wants to hear them. I said no problem and I w/cb tomorrow.

## 2014-04-23 NOTE — Telephone Encounter (Signed)
lmptcb for echo results 

## 2014-06-03 DIAGNOSIS — H6120 Impacted cerumen, unspecified ear: Secondary | ICD-10-CM | POA: Diagnosis not present

## 2014-06-03 DIAGNOSIS — H9190 Unspecified hearing loss, unspecified ear: Secondary | ICD-10-CM | POA: Diagnosis not present

## 2014-06-06 DIAGNOSIS — H26499 Other secondary cataract, unspecified eye: Secondary | ICD-10-CM | POA: Diagnosis not present

## 2014-07-16 DIAGNOSIS — H6123 Impacted cerumen, bilateral: Secondary | ICD-10-CM | POA: Diagnosis not present

## 2014-07-30 DIAGNOSIS — Z0000 Encounter for general adult medical examination without abnormal findings: Secondary | ICD-10-CM | POA: Diagnosis not present

## 2014-07-30 DIAGNOSIS — E049 Nontoxic goiter, unspecified: Secondary | ICD-10-CM | POA: Diagnosis not present

## 2014-07-30 DIAGNOSIS — E780 Pure hypercholesterolemia: Secondary | ICD-10-CM | POA: Diagnosis not present

## 2014-07-30 DIAGNOSIS — Z125 Encounter for screening for malignant neoplasm of prostate: Secondary | ICD-10-CM | POA: Diagnosis not present

## 2014-08-01 DIAGNOSIS — E780 Pure hypercholesterolemia: Secondary | ICD-10-CM | POA: Diagnosis not present

## 2014-08-01 DIAGNOSIS — J309 Allergic rhinitis, unspecified: Secondary | ICD-10-CM | POA: Diagnosis not present

## 2014-08-01 DIAGNOSIS — Z23 Encounter for immunization: Secondary | ICD-10-CM | POA: Diagnosis not present

## 2014-08-23 DIAGNOSIS — H6123 Impacted cerumen, bilateral: Secondary | ICD-10-CM | POA: Diagnosis not present

## 2014-10-08 IMAGING — US US SOFT TISSUE HEAD/NECK
1 series · 14 of 25 positions shown · non-contrast
Comparison: 11/20/2010

CLINICAL DATA: Multi nodular goiter.

THYROID ULTRASOUND
TECHNIQUE: Ultrasound examination of the thyroid gland and adjacent
soft tissues was performed.

[Series 1: us soft tissue head/neck · 0.09mm/px · 14 of 86 slices shown]
[im 1/86]
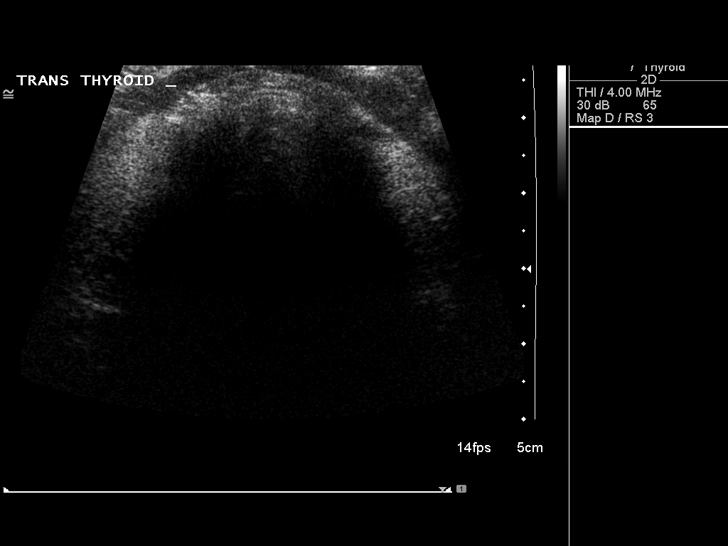
[im 8/86]
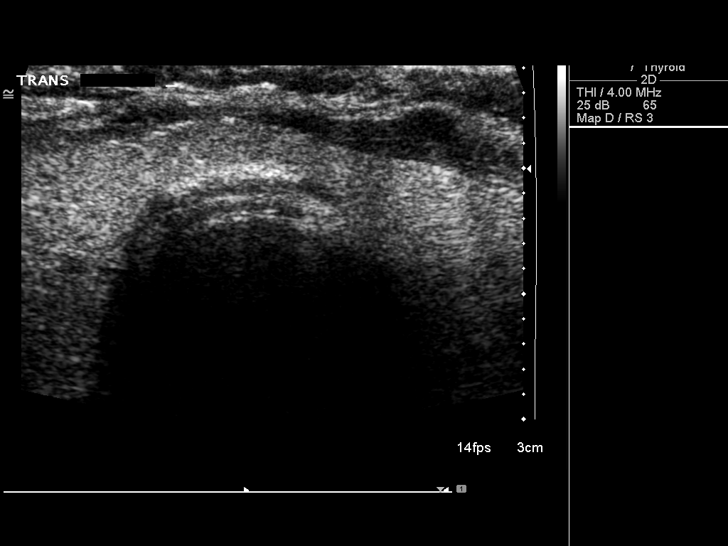
[im 15/86]
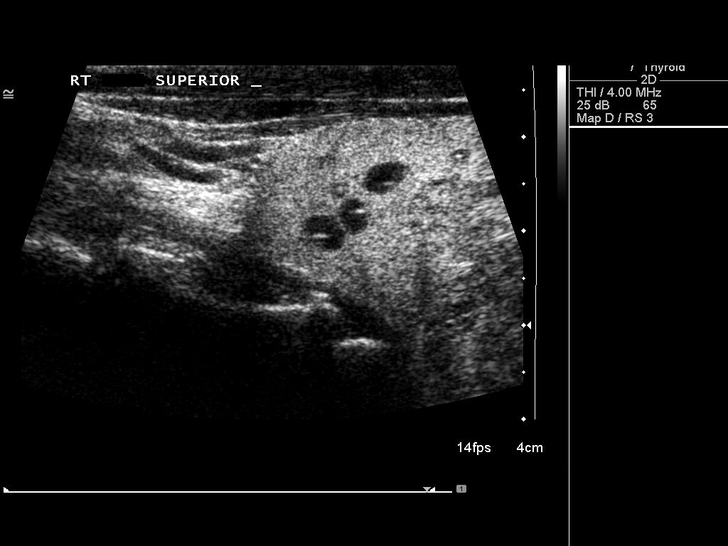
[im 22/86]
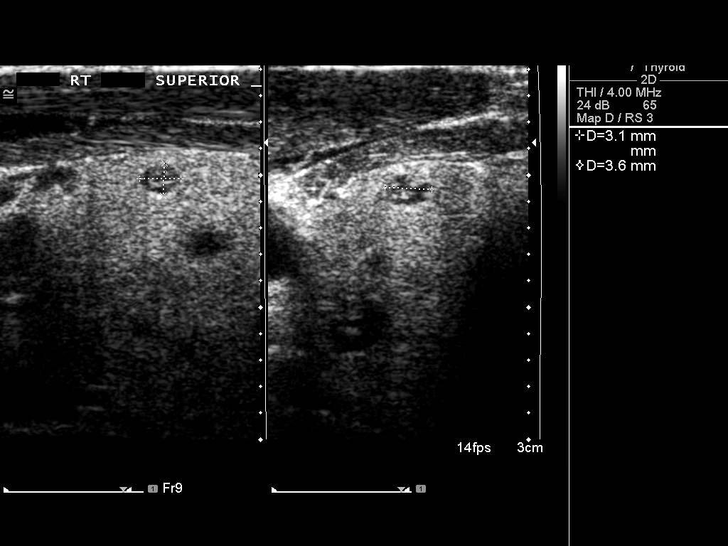
[im 29/86]
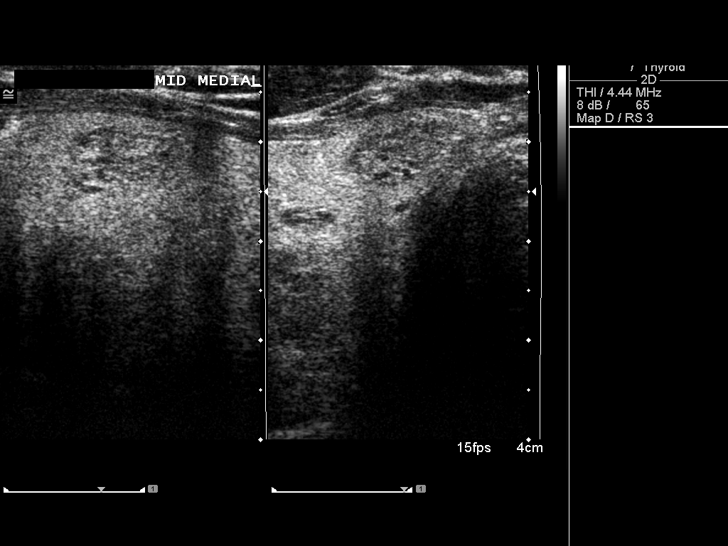
[im 32/86]
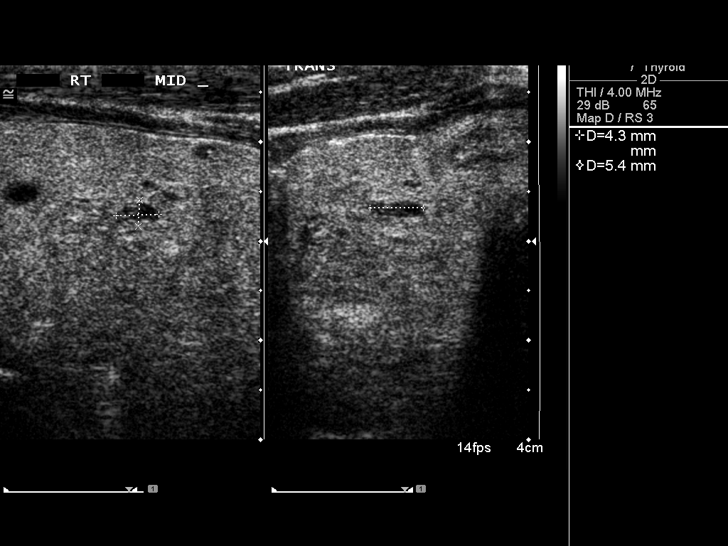
[im 39/86]
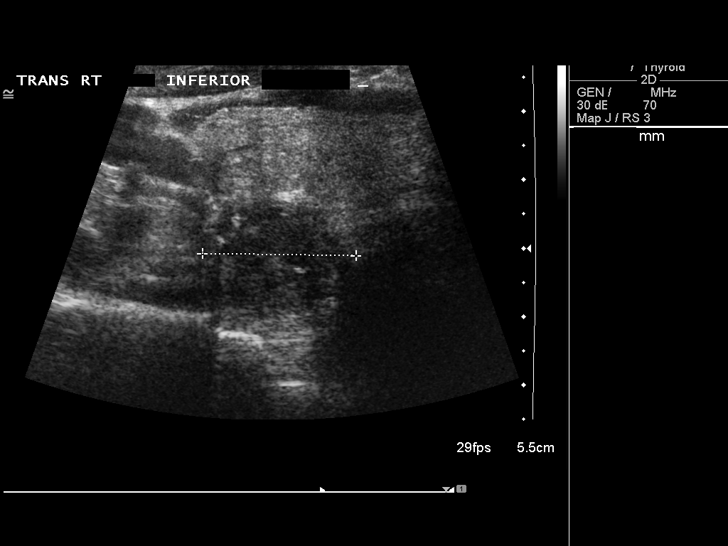
[im 47/86]
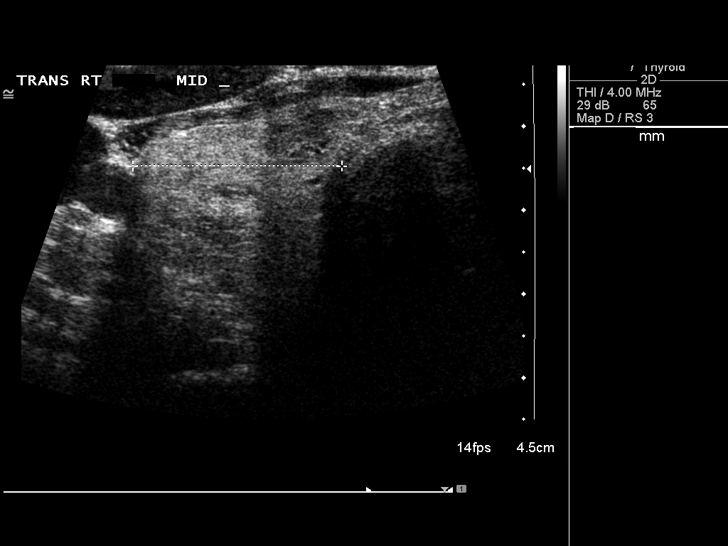
[im 54/86]
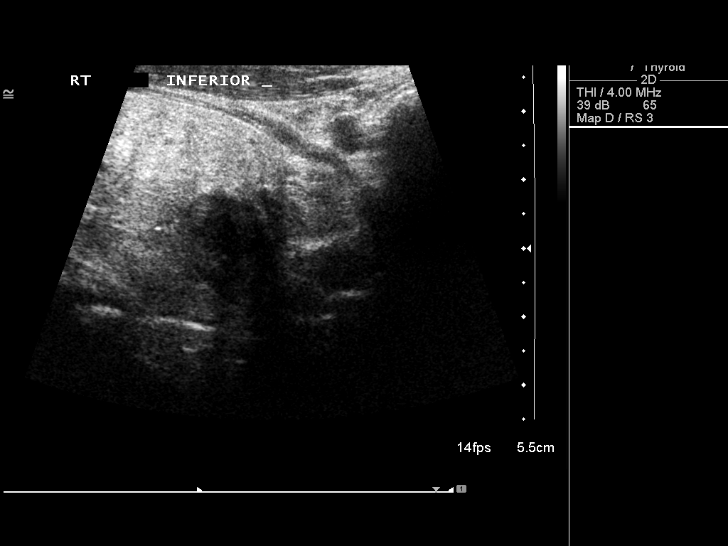
[im 57/86]
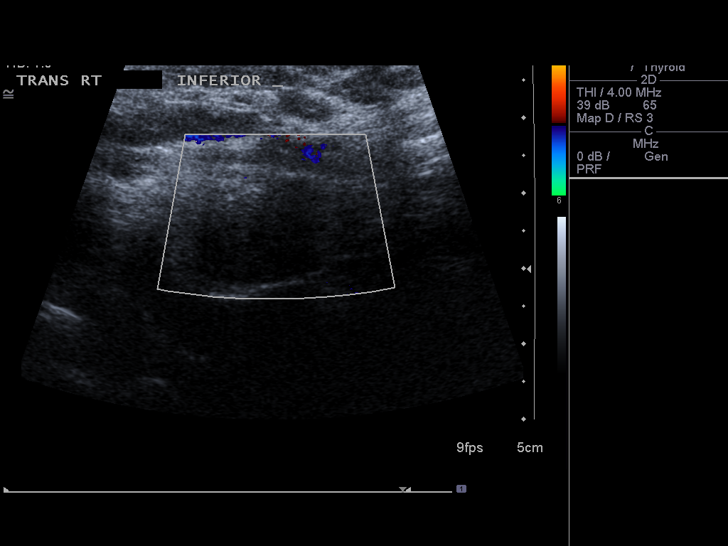
[im 64/86]
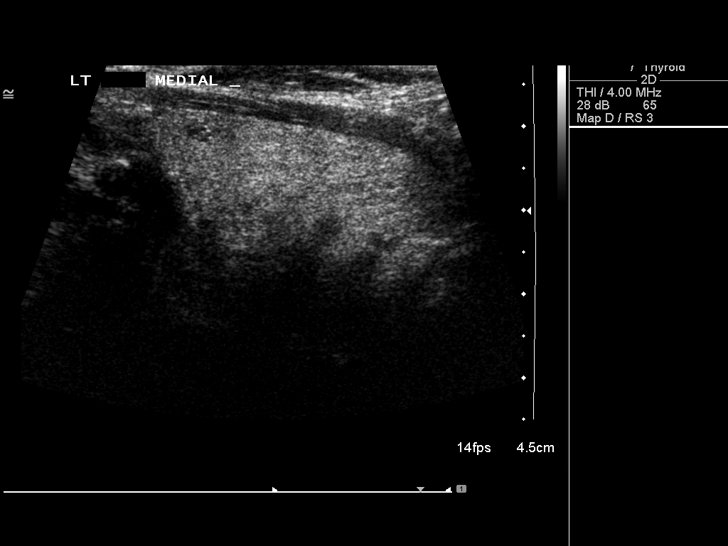
[im 71/86]
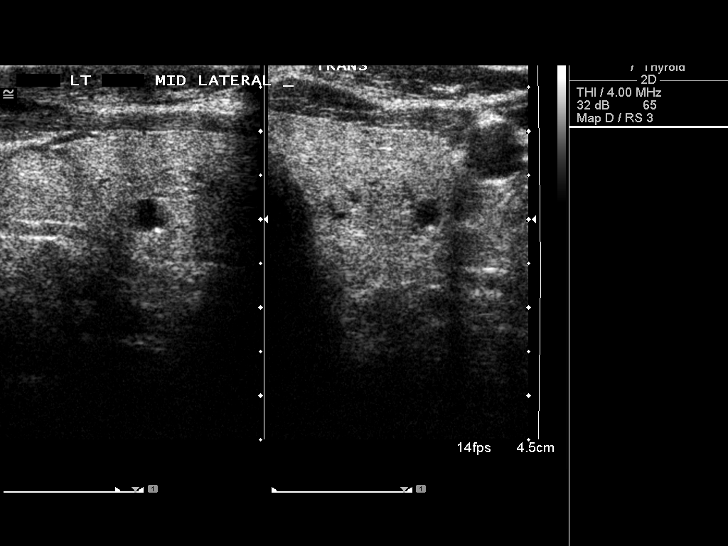
[im 78/86]
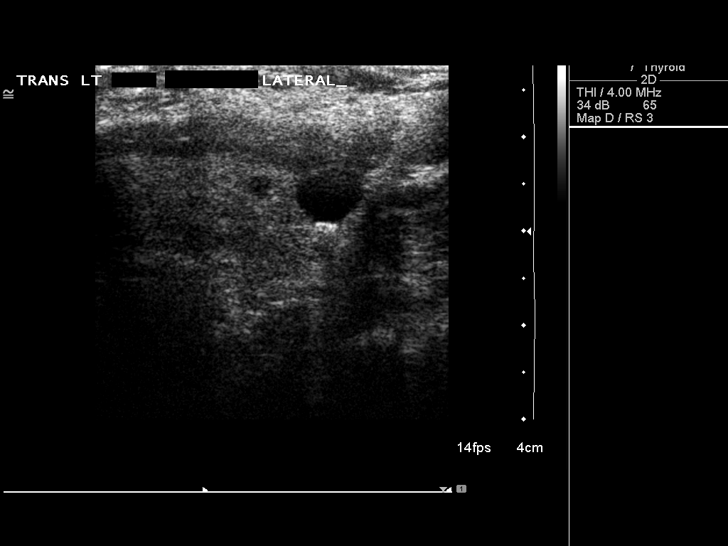
[im 86/86]
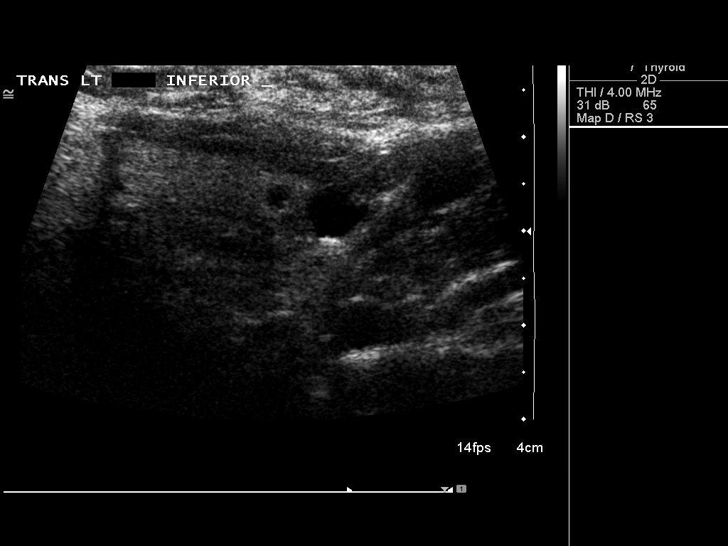

[14 of 25 positions shown; findings below may reference images not displayed]

FINDINGS: Right thyroid lobe:  5.4 x 2.8 x 2.5 cm.
Left thyroid lobe:  4.8 x 2.2 x 1.8 cm.
Isthmus:  0.7 cm.

Focal nodules:  There are numerous cysts throughout the gland
including multiple colloid cysts.  The solid nodules in the right
lobe are unchanged in size and echogenicity.  No new nodules.

Lymphadenopathy:  None visualized.
IMPRESSION: Stable multi nodular goiter.  No new lesions.

## 2014-10-21 DIAGNOSIS — H6123 Impacted cerumen, bilateral: Secondary | ICD-10-CM | POA: Diagnosis not present

## 2014-11-02 DIAGNOSIS — H6121 Impacted cerumen, right ear: Secondary | ICD-10-CM | POA: Diagnosis not present

## 2014-12-05 DIAGNOSIS — H524 Presbyopia: Secondary | ICD-10-CM | POA: Diagnosis not present

## 2014-12-05 DIAGNOSIS — H35372 Puckering of macula, left eye: Secondary | ICD-10-CM | POA: Diagnosis not present

## 2014-12-05 DIAGNOSIS — Z961 Presence of intraocular lens: Secondary | ICD-10-CM | POA: Diagnosis not present

## 2014-12-05 DIAGNOSIS — H35033 Hypertensive retinopathy, bilateral: Secondary | ICD-10-CM | POA: Diagnosis not present

## 2015-01-29 DIAGNOSIS — Z0000 Encounter for general adult medical examination without abnormal findings: Secondary | ICD-10-CM | POA: Diagnosis not present

## 2015-01-29 DIAGNOSIS — E780 Pure hypercholesterolemia: Secondary | ICD-10-CM | POA: Diagnosis not present

## 2015-01-29 DIAGNOSIS — Z7982 Long term (current) use of aspirin: Secondary | ICD-10-CM | POA: Diagnosis not present

## 2015-01-31 DIAGNOSIS — H6123 Impacted cerumen, bilateral: Secondary | ICD-10-CM | POA: Diagnosis not present

## 2015-02-03 DIAGNOSIS — E780 Pure hypercholesterolemia: Secondary | ICD-10-CM | POA: Diagnosis not present

## 2015-02-03 DIAGNOSIS — J309 Allergic rhinitis, unspecified: Secondary | ICD-10-CM | POA: Diagnosis not present

## 2015-02-03 DIAGNOSIS — I351 Nonrheumatic aortic (valve) insufficiency: Secondary | ICD-10-CM | POA: Diagnosis not present

## 2015-02-03 DIAGNOSIS — Z1212 Encounter for screening for malignant neoplasm of rectum: Secondary | ICD-10-CM | POA: Diagnosis not present

## 2015-02-03 DIAGNOSIS — N401 Enlarged prostate with lower urinary tract symptoms: Secondary | ICD-10-CM | POA: Diagnosis not present

## 2015-02-06 DIAGNOSIS — K5900 Constipation, unspecified: Secondary | ICD-10-CM | POA: Diagnosis not present

## 2015-02-06 DIAGNOSIS — R011 Cardiac murmur, unspecified: Secondary | ICD-10-CM | POA: Diagnosis not present

## 2015-02-06 DIAGNOSIS — Z1211 Encounter for screening for malignant neoplasm of colon: Secondary | ICD-10-CM | POA: Diagnosis not present

## 2015-02-06 DIAGNOSIS — E780 Pure hypercholesterolemia: Secondary | ICD-10-CM | POA: Diagnosis not present

## 2015-04-02 DIAGNOSIS — Z1283 Encounter for screening for malignant neoplasm of skin: Secondary | ICD-10-CM | POA: Diagnosis not present

## 2015-04-02 DIAGNOSIS — L570 Actinic keratosis: Secondary | ICD-10-CM | POA: Diagnosis not present

## 2015-04-02 DIAGNOSIS — X32XXXD Exposure to sunlight, subsequent encounter: Secondary | ICD-10-CM | POA: Diagnosis not present

## 2015-05-22 DIAGNOSIS — L570 Actinic keratosis: Secondary | ICD-10-CM | POA: Diagnosis not present

## 2015-05-22 DIAGNOSIS — L820 Inflamed seborrheic keratosis: Secondary | ICD-10-CM | POA: Diagnosis not present

## 2015-05-22 DIAGNOSIS — X32XXXD Exposure to sunlight, subsequent encounter: Secondary | ICD-10-CM | POA: Diagnosis not present

## 2015-06-16 DIAGNOSIS — H6123 Impacted cerumen, bilateral: Secondary | ICD-10-CM | POA: Diagnosis not present

## 2015-06-16 DIAGNOSIS — Z23 Encounter for immunization: Secondary | ICD-10-CM | POA: Diagnosis not present

## 2015-07-09 DIAGNOSIS — H6123 Impacted cerumen, bilateral: Secondary | ICD-10-CM | POA: Diagnosis not present

## 2015-07-09 DIAGNOSIS — H903 Sensorineural hearing loss, bilateral: Secondary | ICD-10-CM | POA: Diagnosis not present

## 2016-01-23 DIAGNOSIS — L570 Actinic keratosis: Secondary | ICD-10-CM | POA: Diagnosis not present

## 2016-01-23 DIAGNOSIS — X32XXXD Exposure to sunlight, subsequent encounter: Secondary | ICD-10-CM | POA: Diagnosis not present

## 2016-02-03 DIAGNOSIS — Z7982 Long term (current) use of aspirin: Secondary | ICD-10-CM | POA: Diagnosis not present

## 2016-02-03 DIAGNOSIS — E7800 Pure hypercholesterolemia, unspecified: Secondary | ICD-10-CM | POA: Diagnosis not present

## 2016-02-03 DIAGNOSIS — Z125 Encounter for screening for malignant neoplasm of prostate: Secondary | ICD-10-CM | POA: Diagnosis not present

## 2016-02-04 DIAGNOSIS — H6123 Impacted cerumen, bilateral: Secondary | ICD-10-CM | POA: Diagnosis not present

## 2016-02-10 ENCOUNTER — Inpatient Hospital Stay (HOSPITAL_COMMUNITY)
Admission: AD | Admit: 2016-02-10 | Discharge: 2016-02-12 | DRG: 244 | Disposition: A | Discharge status: Home / Self Care | Payer: Medicare Other | Source: Ambulatory Visit | Attending: Internal Medicine | Admitting: Internal Medicine

## 2016-02-10 ENCOUNTER — Inpatient Hospital Stay (HOSPITAL_COMMUNITY): Payer: Medicare Other

## 2016-02-10 ENCOUNTER — Encounter (HOSPITAL_COMMUNITY): Payer: Self-pay | Admitting: General Practice

## 2016-02-10 ENCOUNTER — Encounter: Payer: Self-pay | Admitting: Internal Medicine

## 2016-02-10 DIAGNOSIS — R001 Bradycardia, unspecified: Secondary | ICD-10-CM | POA: Diagnosis present

## 2016-02-10 DIAGNOSIS — Z79899 Other long term (current) drug therapy: Secondary | ICD-10-CM | POA: Diagnosis not present

## 2016-02-10 DIAGNOSIS — Z7982 Long term (current) use of aspirin: Secondary | ICD-10-CM | POA: Diagnosis not present

## 2016-02-10 DIAGNOSIS — R0602 Shortness of breath: Secondary | ICD-10-CM | POA: Diagnosis not present

## 2016-02-10 DIAGNOSIS — Z87891 Personal history of nicotine dependence: Secondary | ICD-10-CM | POA: Diagnosis not present

## 2016-02-10 DIAGNOSIS — I442 Atrioventricular block, complete: Secondary | ICD-10-CM | POA: Diagnosis not present

## 2016-02-10 DIAGNOSIS — E785 Hyperlipidemia, unspecified: Secondary | ICD-10-CM | POA: Diagnosis present

## 2016-02-10 DIAGNOSIS — R0600 Dyspnea, unspecified: Secondary | ICD-10-CM

## 2016-02-10 DIAGNOSIS — Z950 Presence of cardiac pacemaker: Secondary | ICD-10-CM | POA: Diagnosis not present

## 2016-02-10 DIAGNOSIS — Z0000 Encounter for general adult medical examination without abnormal findings: Secondary | ICD-10-CM | POA: Diagnosis not present

## 2016-02-10 DIAGNOSIS — E7800 Pure hypercholesterolemia, unspecified: Secondary | ICD-10-CM | POA: Diagnosis not present

## 2016-02-10 DIAGNOSIS — Z8679 Personal history of other diseases of the circulatory system: Secondary | ICD-10-CM | POA: Diagnosis not present

## 2016-02-10 HISTORY — DX: Atrioventricular block, complete: I44.2

## 2016-02-10 HISTORY — DX: Unspecified osteoarthritis, unspecified site: M19.90

## 2016-02-10 LAB — CBC
HCT: 38 % — ABNORMAL LOW (ref 39.0–52.0)
Hemoglobin: 12.1 g/dL — ABNORMAL LOW (ref 13.0–17.0)
MCH: 29.5 pg (ref 26.0–34.0)
MCHC: 31.8 g/dL (ref 30.0–36.0)
MCV: 92.7 fL (ref 78.0–100.0)
PLATELETS: 179 10*3/uL (ref 150–400)
RBC: 4.1 MIL/uL — ABNORMAL LOW (ref 4.22–5.81)
RDW: 13.3 % (ref 11.5–15.5)
WBC: 7.7 10*3/uL (ref 4.0–10.5)

## 2016-02-10 LAB — BASIC METABOLIC PANEL
Anion gap: 10 (ref 5–15)
BUN: 20 mg/dL (ref 6–20)
CALCIUM: 9.3 mg/dL (ref 8.9–10.3)
CO2: 24 mmol/L (ref 22–32)
CREATININE: 1.02 mg/dL (ref 0.61–1.24)
Chloride: 104 mmol/L (ref 101–111)
GFR calc non Af Amer: >60 mL/min (ref >60)
Glucose, Bld: 110 mg/dL — ABNORMAL HIGH (ref 65–99)
Potassium: 4.5 mmol/L (ref 3.5–5.1)
SODIUM: 138 mmol/L (ref 135–145)

## 2016-02-10 LAB — ECHOCARDIOGRAM COMPLETE
Height: 71 in
WEIGHTICAEL: 2806.4 [oz_av]

## 2016-02-10 LAB — TSH: TSH: 1.466 u[IU]/mL (ref 0.350–4.500)

## 2016-02-10 MED ORDER — ATORVASTATIN CALCIUM 40 MG PO TABS
40.0000 mg | ORAL_TABLET | Freq: Every day | ORAL | Status: DC
  Administered 2016-02-10 – 2016-02-12 (×2): 40 mg via ORAL
  Filled 2016-02-10 (×2): qty 1

## 2016-02-10 MED ORDER — SODIUM CHLORIDE 0.9% FLUSH: 3.0000 mL | Freq: Two times a day (BID) | INTRAVENOUS | Status: DC

## 2016-02-10 MED ORDER — CHLORHEXIDINE GLUCONATE 4 % EX LIQD
60.0000 mL | Freq: Once | CUTANEOUS | Status: AC
  Administered 2016-02-10: 4 via TOPICAL
  Filled 2016-02-10: qty 60

## 2016-02-10 MED ORDER — NITROGLYCERIN 0.4 MG SL SUBL: 0.4000 mg | SUBLINGUAL_TABLET | SUBLINGUAL | Status: DC | PRN

## 2016-02-10 MED ORDER — ATORVASTATIN CALCIUM 40 MG PO TABS: 2200.0000 mg | ORAL_TABLET | Freq: Every day | ORAL | Status: DC

## 2016-02-10 MED ORDER — ACETAMINOPHEN 325 MG PO TABS
650.0000 mg | ORAL_TABLET | ORAL | Status: DC | PRN
  Administered 2016-02-12: 650 mg via ORAL
  Filled 2016-02-10: qty 2

## 2016-02-10 MED ORDER — SODIUM CHLORIDE 0.9 % IR SOLN
80.0000 mg | Status: AC
  Administered 2016-02-11: 80 mg

## 2016-02-10 MED ORDER — SODIUM CHLORIDE 0.9% FLUSH: 3.0000 mL | INTRAVENOUS | Status: DC | PRN

## 2016-02-10 MED ORDER — SODIUM CHLORIDE 0.9 % IV SOLN
INTRAVENOUS | Status: DC
  Administered 2016-02-11: 06:00:00 via INTRAVENOUS

## 2016-02-10 MED ORDER — SODIUM CHLORIDE 0.9 % IV SOLN: 250.0000 mL | INTRAVENOUS | Status: DC

## 2016-02-10 MED ORDER — CHLORHEXIDINE GLUCONATE 4 % EX LIQD
60.0000 mL | Freq: Once | CUTANEOUS | Status: AC
  Administered 2016-02-11: 4 via TOPICAL
  Filled 2016-02-10: qty 60

## 2016-02-10 MED ORDER — ONDANSETRON HCL 4 MG/2ML IJ SOLN: 4.0000 mg | Freq: Four times a day (QID) | INTRAMUSCULAR | Status: DC | PRN

## 2016-02-10 MED ORDER — CEFAZOLIN SODIUM-DEXTROSE 2-4 GM/100ML-% IV SOLN
2.0000 g | INTRAVENOUS | Status: AC
  Administered 2016-02-11: 2 g via INTRAVENOUS

## 2016-02-10 NOTE — H&P (Signed)
H&P    Patient ID: Jeffery Rogers MRN: [id number], DOB/AGE: [birth year] 82 y.o.  Admit date: 02/10/2016 Date of Admit: 02/10/2016  Primary Physician: Jeffery Pel, MD Primary Cardiologist: None  Reason for Admit: CHB  HPI: Jeffery Rogers is a 82 y.o. male went to his PMD office today (Dr. Shelia Rogers) for a routine previously scheduled visit.  He was noted to be in CHB without active symptoms.  Dr. Shelia Rogers called [facility] and spoke with Dr. Lovena Rogers (DOD today) who reviewed his EKG and recommended he be directly admitted and planned for PPM implant in the morning.  The patient feels well, states he was going to the doctor for routine eval, having no specific symptoms.  He denies any kind of CP, palpitations, no dizziness, near syncope or syncope.  After further questioning he has noted in the last 2 weeks or so getting unusually winded when going up the stairs or doing his yard work.  He is very active, going to the gym 3 days week on average doing cardio with either stationary bike or cycling outside.  He has been able to do these activities, though again unusually out of breath being his only symptom.  PMHx is very minimal.  Only HLD and seasonal allergies/hayfever, occasional trouble with cerumen impaction.  He takes daily low dose ASA for preventative purposes only, he has no known hx of CAD/MI, or strokes, no known PVD.  Past Medical History  Diagnosis Date  . Hypercholesteremia   . Aortic insufficiency     Echo (04/2013):  Mild LVH, EF 55-60%, Gr 1 DD, mild AS (mean 21 mmHg), mild AI, MAC, mild MR, mild LAE, mild RVE, mod RAE, PASP 39  . Goiter   . Nasal septal deviation   . Cerumen impaction   . Snoring   . Aortic stenosis   . Hx of echocardiogram     Echo (04/2014): Mild LVH, EF 55-60%, no RWMA, mild AS (mean gradient 24 mm Hg), mild AI, mild to mod MS (mean 5 mm Hg), mod to severe LAE, mild RAE, PASP 36 mm Hg     Surgical History: No past surgical history on file.    Prescriptions prior to admission  Medication Sig Dispense Refill Last Dose  . aspirin 81 MG tablet Take 81 mg by mouth daily.   Taking  . atorvastatin (LIPITOR) 40 MG tablet Take 1 tablet by mouth daily.   Taking  . cetirizine (ZYRTEC) 10 MG tablet Take 10 mg by mouth as needed for allergies.   Taking  . neomycin-polymyxin-hydrocortisone (CORTISPORIN) 3.5-10000-1 otic suspension Ear drops   Taking    Inpatient Medications:   Allergies: No Known Allergies  Social History   Social History  . Marital Status: Married    Spouse Name: N/A  . Number of Children: N/A  . Years of Education: N/A   Occupational History  . Not on file.   Social History Main Topics  . Smoking status: Former Research scientist (life sciences)  . Smokeless tobacco: Not on file     Comment: Quit in 1978  . Alcohol Use: Not on file  . Drug Use: Not on file  . Sexual Activity: Not on file   Other Topics Concern  . Not on file   Social History Narrative  . No narrative on file     Family History  Problem Relation Age of Onset  . Stroke    . Irregular heart beat       Review of Systems: All  other systems reviewed and are otherwise negative except as noted above.  Physical Exam: Filed Vitals:   02/10/16 1303  BP: 168/85  Temp: 98.1 F (36.7 C)  TempSrc: Oral  Resp: 16  Height: 5\' 11"  (1.803 m)  Weight: 175 lb 6.4 oz (79.561 kg)  SpO2: 99%    GEN- The patient is well appearing, in NAD, alert and oriented x 3 today.   HEENT: normocephalic, atraumatic; sclera clear, conjunctiva pink; hearing intact; oropharynx clear; neck supple, no JVP Lymph- no cervical lymphadenopathy Lungs- Clear to ausculation bilaterally, normal work of breathing.  No wheezes, rales, rhonchi Heart- Regular rate and rhythm, bradycardic, 2/6 SM, no rubs or gallops, PMI not laterally displaced GI- soft, non-tender, non-distended, bowel sounds present Extremities- no clubbing, cyanosis, or edema MS- no significant deformity or atrophy Skin-  warm and dry, no rash or lesion Psych- euthymic mood, full affect Neuro- no gross deficits observed     EKG: CHB, narrow complex escape rhythm, 45bpm TELEMETRY: CHB, 40's    Assessment and Plan:  1. CHB    Minimally symptomatic with exertion, completely asymptomatic at rest    BP stable    Jeffery Rogers check labs, echo (he has 2/6 SM on exam)    He is not on any rate limiting or nodal blocking agents at home    No recent illness or changes to his routine therapy He is scheduled for PPM implant with Dr. Lovena Rogers tomorrow, pending his labs/echo    Signed, Jeffery Standard, PA-C 02/10/2016 1:29 PM    I have seen and examined this patient with Jeffery Rogers.  Agree with above, note added to reflect my findings.  On exam, regular rhythm, bradycardic, 2/6 systolic murmur at the base.  Presented to his PCP today in heart block.  Has narrow QRS escape rhythm today.  Plan for dual chamber pacemaker tomorrow.  Risks and benefits explained.  Risks include bleeding, infection, tamponade, pneumothorax.  Patient understands these risks and has agreed to the procedure.    Jeffery Rogers M. Jeffery Paino MD 02/10/2016 5:07 PM

## 2016-02-10 NOTE — Progress Notes (Signed)
  Echocardiogram 2D Echocardiogram has been performed.  Jeffery Rogers 02/10/2016, 4:49 PM

## 2016-02-11 ENCOUNTER — Ambulatory Visit (HOSPITAL_COMMUNITY): Admit: 2016-02-11 | Payer: Self-pay | Admitting: Internal Medicine

## 2016-02-11 ENCOUNTER — Encounter (HOSPITAL_COMMUNITY)
Admission: AD | Disposition: A | Discharge status: Home / Self Care | Payer: Self-pay | Source: Ambulatory Visit | Attending: Internal Medicine

## 2016-02-11 ENCOUNTER — Encounter (HOSPITAL_COMMUNITY): Payer: Self-pay | Admitting: Internal Medicine

## 2016-02-11 HISTORY — PX: EP IMPLANTABLE DEVICE: SHX172B

## 2016-02-11 LAB — SURGICAL PCR SCREEN
MRSA, PCR: NEGATIVE
Staphylococcus aureus: NEGATIVE

## 2016-02-11 SURGERY — PACEMAKER IMPLANT

## 2016-02-11 MED ORDER — ONDANSETRON HCL 4 MG/2ML IJ SOLN
4.0000 mg | Freq: Four times a day (QID) | INTRAMUSCULAR | Status: DC | PRN
Start: 2016-02-11 — End: 2016-02-12

## 2016-02-11 MED ORDER — ACETAMINOPHEN 325 MG PO TABS: 325.0000 mg | ORAL_TABLET | ORAL | Status: DC | PRN

## 2016-02-11 MED ORDER — LIDOCAINE HCL (PF) 1 % IJ SOLN
INTRAMUSCULAR | Status: DC | PRN
  Administered 2016-02-11: 31 mL

## 2016-02-11 MED ORDER — CEFAZOLIN SODIUM 1-5 GM-% IV SOLN
1.0000 g | Freq: Four times a day (QID) | INTRAVENOUS | Status: AC
  Administered 2016-02-11 – 2016-02-12 (×3): 1 g via INTRAVENOUS
  Filled 2016-02-11 (×3): qty 50

## 2016-02-11 MED ORDER — FENTANYL CITRATE (PF) 100 MCG/2ML IJ SOLN
INTRAMUSCULAR | Status: DC | PRN
  Administered 2016-02-11 (×3): 12.5 ug via INTRAVENOUS

## 2016-02-11 MED ORDER — MIDAZOLAM HCL 5 MG/5ML IJ SOLN
INTRAMUSCULAR | Status: AC
  Filled 2016-02-11: qty 5

## 2016-02-11 MED ORDER — CEFAZOLIN SODIUM-DEXTROSE 2-4 GM/100ML-% IV SOLN
INTRAVENOUS | Status: AC
  Filled 2016-02-11: qty 100

## 2016-02-11 MED ORDER — LIDOCAINE HCL (PF) 1 % IJ SOLN
INTRAMUSCULAR | Status: AC
  Filled 2016-02-11: qty 30

## 2016-02-11 MED ORDER — SODIUM CHLORIDE 0.9 % IR SOLN
Status: AC
  Filled 2016-02-11: qty 2

## 2016-02-11 MED ORDER — MIDAZOLAM HCL 5 MG/5ML IJ SOLN
INTRAMUSCULAR | Status: DC | PRN
  Administered 2016-02-11 (×3): 1 mg via INTRAVENOUS

## 2016-02-11 MED ORDER — FENTANYL CITRATE (PF) 100 MCG/2ML IJ SOLN
INTRAMUSCULAR | Status: AC
  Filled 2016-02-11: qty 2

## 2016-02-11 SURGICAL SUPPLY — 9 items
CABLE SURGICAL S-101-97-12 (CABLE) ×2 IMPLANT
INGEVITY MRI 7741-52CM (Lead) ×3 IMPLANT
INGEVITY MRI 7742-59CM (Lead) ×3 IMPLANT
LEAD PACING INGEVITY MRI 52CM (Lead) IMPLANT
LEAD PACING INGEVITY MRI 59CM (Lead) IMPLANT
PACEMAKER ACCOLADE DR-EL (Pacemaker) ×2 IMPLANT
PAD DEFIB LIFELINK (PAD) ×2 IMPLANT
SHEATH CLASSIC 7F (SHEATH) ×4 IMPLANT
TRAY PACEMAKER INSERTION (PACKS) ×2 IMPLANT

## 2016-02-11 NOTE — Discharge Instructions (Signed)
° ° °  Supplemental Discharge Instructions for  Pacemaker/Defibrillator Patients  Activity No heavy lifting or vigorous activity with your left/right arm for 6 to 8 weeks.  Do not raise your left/right arm above your head for one week.  Gradually raise your affected arm as drawn below.              02/15/16                    02/16/16                     02/17/16                   02/18/16 __  NO DRIVING for   1 week  ; you may begin driving on  [id number]   .  WOUND CARE - Keep the wound area clean and dry.  Do not get this area wet for one week. No showers for one week; you may shower on  02/18/16   . - The tape/steri-strips on your wound will fall off; do not pull them off.  No bandage is needed on the site.  DO  NOT apply any creams, oils, or ointments to the wound area. - If you notice any drainage or discharge from the wound, any swelling or bruising at the site, or you develop a fever > 101? F after you are discharged home, call the office at once.  Special Instructions - You are still able to use cellular telephones; use the ear opposite the side where you have your pacemaker/defibrillator.  Avoid carrying your cellular phone near your device. - When traveling through airports, show security personnel your identification card to avoid being screened in the metal detectors.  Ask the security personnel to use the hand wand. - Avoid arc welding equipment, MRI testing (magnetic resonance imaging), TENS units (transcutaneous nerve stimulators).  Call the office for questions about other devices. - Avoid electrical appliances that are in poor condition or are not properly grounded. - Microwave ovens are safe to be near or to operate.  Additional information for defibrillator patients should your device go off: - If your device goes off ONCE and you feel fine afterward, notify the device clinic nurses. - If your device goes off ONCE and you do not feel well afterward, call 911. - If your device  goes off TWICE, call 911. - If your device goes off THREE times in one day, call 911.  DO NOT DRIVE YOURSELF OR A FAMILY MEMBER WITH A DEFIBRILLATOR TO THE HOSPITAL--CALL 911.

## 2016-02-11 NOTE — Progress Notes (Signed)
SUBJECTIVE: The patient is doing well today.  At this time, he denies chest pain, shortness of breath, or any new concerns.  Marland Kitchen atorvastatin  40 mg Oral Daily  .  ceFAZolin (ANCEF) IV  2 g Intravenous On Call  . gentamicin irrigation  80 mg Irrigation On Call  . sodium chloride flush  3 mL Intravenous Q12H   . sodium chloride 50 mL/hr at 02/11/16 0544  . sodium chloride      OBJECTIVE: Physical Exam: Filed Vitals:   02/10/16 1303 02/10/16 2200 02/11/16 0450  BP: 168/85 137/86 159/62  Pulse:  53 43  Temp: 98.1 F (36.7 C) 98.2 F (36.8 C) 98.1 F (36.7 C)  TempSrc: Oral Oral Oral  Resp: 16 18 16   Height: 5\' 11"  (1.803 m)    Weight: 175 lb 6.4 oz (79.561 kg)    SpO2: 99% 96% 97%   No intake or output data in the 24 hours ending 02/11/16 0755  Telemetry reveals remains in CHB, 40's V rate  GEN- The patient is well appearing, alert and oriented x 3 today.   Head- normocephalic, atraumatic Eyes-  Sclera clear, conjunctiva pink Ears- hearing intact Oropharynx- clear Neck- supple, no JVP Lungs- Clear to ausculation bilaterally, normal work of breathing Heart- Regular rate and rhythm, bradycardic, 2/6 SM, no rubs or gallops GI- soft, NT, ND Extremities- no clubbing, cyanosis, or edema Skin- no rash or lesion Psych- euthymic mood, full affect Neuro- no gross deficits appreciated  LABS: Basic Metabolic Panel:  Recent Labs  02/10/16 1408  NA 138  K 4.5  CL 104  CO2 24  GLUCOSE 110*  BUN 20  CREATININE 1.02  CALCIUM 9.3   CBC:  Recent Labs  02/10/16 1408  WBC 7.7  HGB 12.1*  HCT 38.0*  MCV 92.7  PLT 179   Thyroid Function Tests:  Recent Labs  02/10/16 1408  TSH 1.466   02/10/16: Echocardiogram Study Conclusions - Left ventricle: The cavity size was normal. There was mild  concentric hypertrophy. Systolic function was vigorous. The  estimated ejection fraction was in the range of 65% to 70%. Wall  motion was normal; there were no regional  wall motion  abnormalities. The study is not technically sufficient to allow  evaluation of LV diastolic function. - Aortic valve: Trileaflet; severely thickened, severely calcified  leaflets. Valve mobility was restricted. There was moderate  stenosis. There was mild regurgitation. Mean gradient (S): 34 mm  Hg. Peak gradient (S): 69 mm Hg. Valve area (VTI): 1.05 cm^2.  Valve area (Vmax): 0.95 cm^2. Valve area (Vmean): 0.91 cm^2. - Aortic root: The aortic root was normal in size. - Mitral valve: Calcified annulus. Moderately thickened, moderately  calcified leaflets . Valve area by pressure half-time: 2.5 cm^2.  Valve area by continuity equation (using LVOT flow): 1.39 cm^2. - Left atrium: The atrium was mildly dilated. - Right ventricle: The cavity size was normal. Wall thickness was  normal. Systolic function was normal. - Tricuspid valve: There was mild regurgitation. - Pulmonic valve: There was trivial regurgitation. - Pulmonary arteries: Systolic pressure was mildly increased. PA  peak pressure: 36 mm Hg (S). - Inferior vena cava: The vessel was normal in size. - Pericardium, extracardiac: There was no pericardial effusion.  ASSESSMENT AND PLAN:  Active Problems:   Complete heart block ([place])  1. CHB  Minimally symptomatic with exertion, completely asymptomatic at rest  BP stable  He is not on any rate limiting or nodal blocking agents at home  Labs stable    No reversible causes identified  No recent illness or changes to his routine therapy    He is scheduled for PPM implant with Dr. Lovena Le this morning.  Dr. Lovena Le has discussed risks/benefits, alternative with the patient, his wife and daughter at bedside, and the patient is agreeable to proceed.  2. VHD     Mod AS        Tommye Standard, PA-C 02/11/2016 7:55 AM  EP Attending  Patient seen and examined. Agree with above. The patient has complete heart block with a slow VR. He has modest aortic valve  disease and appears to have conduction system disease as a consequence. He is on no reversible agents. I have discussed the risks/benefits/goals/expectations of PPM insertion with the patient and his family and they wish to proceed.  Mikle Bosworth.D.

## 2016-02-11 NOTE — Interval H&P Note (Signed)
History and Physical Interval Note:  02/11/2016 9:05 AM  Jeffery Rogers  has presented today for surgery, with the diagnosis of hb  The various methods of treatment have been discussed with the patient and family. After consideration of risks, benefits and other options for treatment, the patient has consented to  Procedure(s): Pacemaker Implant (N/A) as a surgical intervention .  The patient's history has been reviewed, patient examined, no change in status, stable for surgery.  I have reviewed the patient's chart and labs.  Questions were answered to the patient's satisfaction.     Cristopher Peru

## 2016-02-11 NOTE — Discharge Summary (Signed)
ELECTROPHYSIOLOGY PROCEDURE DISCHARGE SUMMARY    Patient ID: Jeffery Rogers,  MRN: [id number], DOB/AGE: 1932-08-09 82 y.o.  Admit date: 02/10/2016 Discharge date: 02/12/16  Primary Care Physician: Horatio Pel, MD Primary Cardiologist/Electrophysiologist: New Dr. Lovena Le  Primary Discharge Diagnosis:  1. CHB     s/p PPM implant  Secondary Discharge Diagnosis:  1. HLD  No Known Allergies   Procedures This Admission:  1.  Implantation of a BSci dual chamber PPM on 02/11/16 by Dr Lovena Le.  The patient received a [employer] Sci MRI compatible (serial number [id number]) PPM with  [employer] Sci MRI compatible (serial number [id number]) right atrial lead and a [employer] Sci MRI compatible(serial number [id number]) right ventricular lead. There were no immediate post procedure complications. 2.  CXR on 02/12/16 demonstrated no pneumothorax status post device implantation.   Brief HPI: Jeffery Rogers is a 82 y.o. male went to his PMD office today (Dr. Shelia Media) for a routine previously scheduled visit. He was noted to be in CHB Dr. Shelia Media called [facility] and spoke with Dr. Lovena Le (DOD today) who reviewed his EKG and recommended he be directly admitted and planned for PPM implant.  Hospital Course:  The patient was admitted stated that feels well, states he was going to the doctor for routine eval, having no specific symptoms. He denies any kind of CP, palpitations, no dizziness, near syncope or syncope. After further questioning he has noted in the last 2 weeks or so getting unusually winded when going up the stairs or doing his yard work. He is very active, going to the gym 3 days week on average doing cardio with either stationary bike or cycling outside. He has been able to do these activities, though again unusually out of breath being his only symptom. He had an echo that showed: Study Conclusions - Left ventricle: The cavity size was normal. There was mild  concentric hypertrophy.  Systolic function was vigorous. The  estimated ejection fraction was in the range of 65% to 70%. Wall  motion was normal; there were no regional wall motion  abnormalities. The study is not technically sufficient to allow  evaluation of LV diastolic function. - Aortic valve: Trileaflet; severely thickened, severely calcified  leaflets. Valve mobility was restricted. There was moderate  stenosis. There was mild regurgitation. Mean gradient (S): 34 mm  Hg. Peak gradient (S): 69 mm Hg. Valve area (VTI): 1.05 cm^2.  Valve area (Vmax): 0.95 cm^2. Valve area (Vmean): 0.91 cm^2. - Aortic root: The aortic root was normal in size. - Mitral valve: Calcified annulus. Moderately thickened, moderately  calcified leaflets . Valve area by pressure half-time: 2.5 cm^2.  Valve area by continuity equation (using LVOT flow): 1.39 cm^2. - Left atrium: The atrium was mildly dilated. - Right ventricle: The cavity size was normal. Wall thickness was  normal. Systolic function was normal. - Tricuspid valve: There was mild regurgitation. - Pulmonic valve: There was trivial regurgitation. - Pulmonary arteries: Systolic pressure was mildly increased. PA  peak pressure: 36 mm Hg (S). - Inferior vena cava: The vessel was normal in size. - Pericardium, extracardiac: There was no pericardial effusion.  No reversible causes were identified, and he underwent implantation of a PPM with details as outlined above. He was monitored on telemetry overnight which demonstrated SR with V pacing, occ AVpacing.  Left chest was without hematoma or ecchymosis.  The device was interrogated and found to be functioning normally.  He was noted by the device to have 2  episodes of very brief (few seconds only) of AFlutter, this was discussed with the patient, will monitor for any further via his pacer, if he has any more significant amounts/duration of AF will need to discuss anticoagulation.  His BP has been higher then what he  reports as his usual.  He has never been found to be hypertensive and is asked to monitor his BP daily at home keeping a log and bring it to his f/u visit.   He assures [place] he has a cuff at home and will be able to do this.  CXR was obtained and demonstrated no pneumothorax status post device implantation.  Wound care, arm mobility, and restrictions were reviewed with the patient.  The patient was examined and considered stable for discharge to home.    Physical Exam: Filed Vitals:   02/11/16 1300 02/11/16 1400 02/11/16 1500 02/11/16 2111  BP: 147/83 144/79 152/83 165/88  Pulse: 60 57 51 63  Temp:  98 F (36.7 C)  98.7 F (37.1 C)  TempSrc:  Oral  Oral  Resp: 15 19 17 18   Height:      Weight:      SpO2: 96% 94% 96% 97%    GEN- The patient is well appearing, in NAD, alert and oriented x 3 today.   HEENT: normocephalic, atraumatic; sclera clear, conjunctiva pink; hearing intact; oropharynx clear; neck supple, no JVP Lungs- Clear to ausculation bilaterally, normal work of breathing.  No wheezes, rales, rhonchi Heart- Regular rate and rhythm,2/6 SM, no rubs or gallops, PMI not laterally displaced GI- soft, non-tender, non-distended, bowel sounds present, no hepatosplenomegaly Extremities- no clubbing, cyanosis, or edema MS- no significant deformity or atrophy Skin- warm and dry, no rash or lesion, left chest without hematoma/ecchymosis Psych- euthymic mood, full affect Neuro- no gross deficits   Labs:   Lab Results  Component Value Date   WBC 7.7 02/10/2016   HGB 12.1* 02/10/2016   HCT 38.0* 02/10/2016   MCV 92.7 02/10/2016   PLT 179 02/10/2016     Recent Labs Lab 02/10/16 1408  NA 138  K 4.5  CL 104  CO2 24  BUN 20  CREATININE 1.02  CALCIUM 9.3  GLUCOSE 110*    Discharge Medications:    Medication List    TAKE these medications        aspirin 81 MG tablet  Take 81 mg by mouth daily.     atorvastatin 40 MG tablet  Commonly known as:  LIPITOR  Take 1 tablet  by mouth daily.     cetirizine 10 MG tablet  Commonly known as:  ZYRTEC  Take 10 mg by mouth as needed for allergies.     diphenhydrAMINE 25 mg capsule  Commonly known as:  BENADRYL  Take 25 mg by mouth every 6 (six) hours as needed for allergies.     oxymetazoline 0.05 % nasal spray  Commonly known as:  AFRIN  Place 1 spray into both nostrils 2 (two) times daily as needed for congestion.        Disposition:  Home Discharge Instructions    Diet - low sodium heart healthy    Complete by:  As directed      Increase activity slowly    Complete by:  As directed           Follow-up Information    Follow up with [facility] On 02/23/2016.   Specialty:  Cardiology   Why:  3:00PM, wound check  Contact information:   [address] [place] [phone]      Follow up with Cristopher Peru, MD On 05/18/2016.   Specialty:  Cardiology   Why:  12:00PM   Contact information:   [address] [address] [phone]       Duration of Discharge Encounter: Greater than 30 minutes including physician time.  Venetia Night, PA-C 02/12/2016 11:54 AM  EP Attending  Patient seen and examined. Agree with above. Ready for discharge home. Usual followup.  Mikle Bosworth.D.

## 2016-02-11 NOTE — H&P (View-Only) (Signed)
SUBJECTIVE: The patient is doing well today.  At this time, he denies chest pain, shortness of breath, or any new concerns.  Marland Kitchen atorvastatin  40 mg Oral Daily  .  ceFAZolin (ANCEF) IV  2 g Intravenous On Call  . gentamicin irrigation  80 mg Irrigation On Call  . sodium chloride flush  3 mL Intravenous Q12H   . sodium chloride 50 mL/hr at 02/11/16 0544  . sodium chloride      OBJECTIVE: Physical Exam: Filed Vitals:   02/10/16 1303 02/10/16 2200 02/11/16 0450  BP: 168/85 137/86 159/62  Pulse:  53 43  Temp: 98.1 F (36.7 C) 98.2 F (36.8 C) 98.1 F (36.7 C)  TempSrc: Oral Oral Oral  Resp: 16 18 16   Height: 5\' 11"  (1.803 m)    Weight: 175 lb 6.4 oz (79.561 kg)    SpO2: 99% 96% 97%   No intake or output data in the 24 hours ending 02/11/16 0755  Telemetry reveals remains in CHB, 40's V rate  GEN- The patient is well appearing, alert and oriented x 3 today.   Head- normocephalic, atraumatic Eyes-  Sclera clear, conjunctiva pink Ears- hearing intact Oropharynx- clear Neck- supple, no JVP Lungs- Clear to ausculation bilaterally, normal work of breathing Heart- Regular rate and rhythm, bradycardic, 2/6 SM, no rubs or gallops GI- soft, NT, ND Extremities- no clubbing, cyanosis, or edema Skin- no rash or lesion Psych- euthymic mood, full affect Neuro- no gross deficits appreciated  LABS: Basic Metabolic Panel:  Recent Labs  02/10/16 1408  NA 138  K 4.5  CL 104  CO2 24  GLUCOSE 110*  BUN 20  CREATININE 1.02  CALCIUM 9.3   CBC:  Recent Labs  02/10/16 1408  WBC 7.7  HGB 12.1*  HCT 38.0*  MCV 92.7  PLT 179   Thyroid Function Tests:  Recent Labs  02/10/16 1408  TSH 1.466   02/10/16: Echocardiogram Study Conclusions - Left ventricle: The cavity size was normal. There was mild  concentric hypertrophy. Systolic function was vigorous. The  estimated ejection fraction was in the range of 65% to 70%. Wall  motion was normal; there were no regional  wall motion  abnormalities. The study is not technically sufficient to allow  evaluation of LV diastolic function. - Aortic valve: Trileaflet; severely thickened, severely calcified  leaflets. Valve mobility was restricted. There was moderate  stenosis. There was mild regurgitation. Mean gradient (S): 34 mm  Hg. Peak gradient (S): 69 mm Hg. Valve area (VTI): 1.05 cm^2.  Valve area (Vmax): 0.95 cm^2. Valve area (Vmean): 0.91 cm^2. - Aortic root: The aortic root was normal in size. - Mitral valve: Calcified annulus. Moderately thickened, moderately  calcified leaflets . Valve area by pressure half-time: 2.5 cm^2.  Valve area by continuity equation (using LVOT flow): 1.39 cm^2. - Left atrium: The atrium was mildly dilated. - Right ventricle: The cavity size was normal. Wall thickness was  normal. Systolic function was normal. - Tricuspid valve: There was mild regurgitation. - Pulmonic valve: There was trivial regurgitation. - Pulmonary arteries: Systolic pressure was mildly increased. PA  peak pressure: 36 mm Hg (S). - Inferior vena cava: The vessel was normal in size. - Pericardium, extracardiac: There was no pericardial effusion.  ASSESSMENT AND PLAN:  Active Problems:   Complete heart block ([place])  1. CHB  Minimally symptomatic with exertion, completely asymptomatic at rest  BP stable  He is not on any rate limiting or nodal blocking agents at home  Labs stable    No reversible causes identified  No recent illness or changes to his routine therapy    He is scheduled for PPM implant with Dr. Lovena Le this morning.  Dr. Lovena Le has discussed risks/benefits, alternative with the patient, his wife and daughter at bedside, and the patient is agreeable to proceed.  2. VHD     Mod AS        Tommye Standard, PA-C 02/11/2016 7:55 AM  EP Attending  Patient seen and examined. Agree with above. The patient has complete heart block with a slow VR. He has modest aortic valve  disease and appears to have conduction system disease as a consequence. He is on no reversible agents. I have discussed the risks/benefits/goals/expectations of PPM insertion with the patient and his family and they wish to proceed.  Mikle Bosworth.D.

## 2016-02-12 ENCOUNTER — Inpatient Hospital Stay (HOSPITAL_COMMUNITY): Payer: Medicare Other

## 2016-02-12 NOTE — Plan of Care (Signed)
Problem: Education: Goal: Knowledge of [facility] Education information/materials will improve Outcome: Completed/Met Date Met:  02/12/16 Pt educated on safety measures, current/new medications, and pain scale. Pt verbalized understanding. Reviewed care plan with pt and family.

## 2016-02-12 NOTE — Progress Notes (Signed)
Report received via Shelba Flake RN in patient's room using SBAR format, reviewed VS, tests, meds, labs and patient's general condition, assumed care of patient.

## 2016-02-14 ENCOUNTER — Encounter: Payer: Self-pay | Admitting: Internal Medicine

## 2016-02-18 ENCOUNTER — Telehealth: Payer: Self-pay | Admitting: *Deleted

## 2016-02-18 NOTE — Telephone Encounter (Signed)
Spoke with patient's wife. Offering to reschedule wound check from 5/22 to 5/24 per GT request (to discuss [place] due to new AT/AF). Rescheduled appt. I gave wife our direct # incase Mr. Baack needs to call back. She is agreeable. Wife not made aware of findings on PPM remote monitoring.

## 2016-02-23 ENCOUNTER — Ambulatory Visit: Payer: Medicare Other

## 2016-02-25 ENCOUNTER — Encounter: Payer: Self-pay | Admitting: Internal Medicine

## 2016-02-25 ENCOUNTER — Ambulatory Visit (INDEPENDENT_AMBULATORY_CARE_PROVIDER_SITE_OTHER): Payer: Medicare Other | Admitting: *Deleted

## 2016-02-25 DIAGNOSIS — Z950 Presence of cardiac pacemaker: Secondary | ICD-10-CM

## 2016-02-25 DIAGNOSIS — I480 Paroxysmal atrial fibrillation: Secondary | ICD-10-CM | POA: Diagnosis not present

## 2016-02-25 LAB — CUP PACEART INCLINIC DEVICE CHECK
Brady Statistic RA Percent Paced: 5 %
Brady Statistic RV Percent Paced: 100 %
Implantable Lead Implant Date: 20170510
Implantable Lead Implant Date: 20170510
Implantable Lead Location: 753859
Lead Channel Impedance Value: 736 Ohm
Lead Channel Sensing Intrinsic Amplitude: 14.5 mV
Lead Channel Setting Pacing Amplitude: 1.2 V
Lead Channel Setting Pacing Amplitude: 3.5 V
Lead Channel Setting Pacing Pulse Width: 0.4 ms
MDC IDC LEAD LOCATION: 753860
MDC IDC LEAD SERIAL: 729262
MDC IDC LEAD SERIAL: 766721
MDC IDC MSMT LEADCHNL RA SENSING INTR AMPL: 1.7 mV
MDC IDC MSMT LEADCHNL RV IMPEDANCE VALUE: 745 Ohm
MDC IDC MSMT LEADCHNL RV PACING THRESHOLD AMPLITUDE: 0.6 V
MDC IDC MSMT LEADCHNL RV PACING THRESHOLD PULSEWIDTH: 0.4 ms
MDC IDC SESS DTM: 20170524040000
MDC IDC SET LEADCHNL RV SENSING SENSITIVITY: 2.5 mV
Pulse Gen Serial Number: 748273

## 2016-02-25 MED ORDER — APIXABAN 5 MG PO TABS: 5.0000 mg | ORAL_TABLET | Freq: Two times a day (BID) | ORAL | Status: DC

## 2016-02-25 NOTE — Progress Notes (Signed)
Wound check appointment. Steri-strips removed. Wound without redness or edema. Incision edges approximated, wound well healed. Normal device function. Thresholds, sensing, and impedances consistent with implant measurements. Device programmed at 3.5V/auto capture programmed on for extra safety margin until 3 month visit. Histogram distribution appropriate for patient and level of activity/ atrial fibrillation. 70% AT/AF since 02/12/16- Dr. Lovena Le previously aware through remote transmission. He discussed atrial fibrillation, risk of stroke and oral anticoagulation options with the patient and his daughter. They have elected to start eliquis 5mg  BID at this time. 4 weeks of samples given, Rx sent to [facility] electronically. Greater than 1 hour was spent with this patient in total. No high ventricular rates noted. Patient educated about wound care, arm mobility, lifting restrictions. Patient and daughter concerned about upcoming travels and monitoring- I have advised them to leave their monitor at home since they will be away < 2 weeks at a time. ROV with GT 05/18/16.

## 2016-05-11 DIAGNOSIS — H35031 Hypertensive retinopathy, right eye: Secondary | ICD-10-CM | POA: Diagnosis not present

## 2016-05-11 DIAGNOSIS — H02833 Dermatochalasis of right eye, unspecified eyelid: Secondary | ICD-10-CM | POA: Diagnosis not present

## 2016-05-11 DIAGNOSIS — Z961 Presence of intraocular lens: Secondary | ICD-10-CM | POA: Diagnosis not present

## 2016-05-11 DIAGNOSIS — H35032 Hypertensive retinopathy, left eye: Secondary | ICD-10-CM | POA: Diagnosis not present

## 2016-05-18 ENCOUNTER — Encounter: Payer: Self-pay | Admitting: Internal Medicine

## 2016-05-18 ENCOUNTER — Ambulatory Visit (INDEPENDENT_AMBULATORY_CARE_PROVIDER_SITE_OTHER): Payer: Medicare Other | Admitting: Internal Medicine

## 2016-05-18 VITALS — BP 136/82 | HR 70 | Ht 71.0 in | Wt 178.0 lb

## 2016-05-18 DIAGNOSIS — I440 Atrioventricular block, first degree: Secondary | ICD-10-CM

## 2016-05-18 DIAGNOSIS — I442 Atrioventricular block, complete: Secondary | ICD-10-CM | POA: Diagnosis not present

## 2016-05-18 DIAGNOSIS — I480 Paroxysmal atrial fibrillation: Secondary | ICD-10-CM | POA: Diagnosis not present

## 2016-05-18 MED ORDER — ATORVASTATIN CALCIUM 40 MG PO TABS: 40.0000 mg | ORAL_TABLET | Freq: Every day | ORAL | 3 refills | Status: DC

## 2016-05-18 MED ORDER — APIXABAN 5 MG PO TABS: 5.0000 mg | ORAL_TABLET | Freq: Two times a day (BID) | ORAL | 5 refills | Status: DC

## 2016-05-18 NOTE — Progress Notes (Signed)
HPI Jeffery Rogers returns today for ongoing PPM followup. He is a very active 82 yo man with complete heart block and atrial fib, discovered on presentation to the hospital 3 months ago. He underwent PPM insertion and initiation of anti-coagulation. He has done well in the interim except that he gets fatigued and dizzy when he exerts himself strenuously. He denies palpitations, chest pain or sob. No syncope.  Not on File   Current Outpatient Prescriptions  Medication Sig Dispense Refill  . acetaminophen (TYLENOL) 325 MG tablet Take 325 mg by mouth every 6 (six) hours as needed for headache.    Marland Kitchen apixaban (ELIQUIS) 5 MG TABS tablet Take 1 tablet (5 mg total) by mouth 2 (two) times daily. 60 tablet 5  . atorvastatin (LIPITOR) 40 MG tablet Take 1 tablet by mouth daily.    . cetirizine (ZYRTEC) 10 MG tablet Take 10 mg by mouth as needed for allergies.    . diphenhydrAMINE (BENADRYL) 25 mg capsule Take 25 mg by mouth every 6 (six) hours as needed for allergies.    Marland Kitchen oxymetazoline (AFRIN) 0.05 % nasal spray Place 1 spray into both nostrils 2 (two) times daily as needed for congestion.     No current facility-administered medications for this visit.      Past Medical History:  Diagnosis Date  . Aortic insufficiency    Echo (04/2013):  Mild LVH, EF 55-60%, Gr 1 DD, mild AS (mean 21 mmHg), mild AI, MAC, mild MR, mild LAE, mild RVE, mod RAE, PASP 39  . Aortic stenosis   . Arthritis    "right arm/shoulder" (02/10/2016)  . Cerumen impaction   . Complete heart block (Home) 02/10/2016  . Goiter   . Hay fever   . Headache    "related to sinus pressure" (02/10/2016)  . Heart murmur   . Hx of echocardiogram    Echo (04/2014): Mild LVH, EF 55-60%, no RWMA, mild AS (mean gradient 24 mm Hg), mild AI, mild to mod MS (mean 5 mm Hg), mod to severe LAE, mild RAE, PASP 36 mm Hg  . Hypercholesteremia   . Nasal septal deviation   . Skin cancer of scalp    "check and have them burned off prn q 6 months"  (02/10/2016)  . Snoring     ROS:   All systems reviewed and negative except as noted in the HPI.   Past Surgical History:  Procedure Laterality Date  . CATARACT EXTRACTION W/ INTRAOCULAR LENS  IMPLANT, BILATERAL Bilateral ~ 2000  . EP IMPLANTABLE DEVICE N/A 02/11/2016   Procedure: Pacemaker Implant;  Surgeon: Sausedo Lance, MD;  Location: [facility];  Service: Cardiovascular;  Laterality: N/A;  . TONSILLECTOMY  1930s  . WISDOM TOOTH EXTRACTION  1990s     Family History  Problem Relation Age of Onset  . Stroke    . Irregular heart beat       Social History   Social History  . Marital status: Married    Spouse name: N/A  . Number of children: N/A  . Years of education: N/A   Occupational History  . Not on file.   Social History Main Topics  . Smoking status: Former Smoker    Packs/day: 1.00    Years: 16.00    Types: Cigarettes  . Smokeless tobacco: Never Used     Comment: Quit smoking cigarettes in 1971  . Alcohol use 1.2 oz/week    1 Cans of beer, 1 Shots  of liquor per week  . Drug use: No  . Sexual activity: Yes   Other Topics Concern  . Not on file   Social History Narrative  . No narrative on file     BP 136/82   Pulse 70   Ht 5\' 11"  (1.803 m)   Wt 178 lb (80.7 kg)   BMI 24.83 kg/m   Physical Exam:  Well appearing elderly man, NAD HEENT: Unremarkable Neck:  6 cm JVD, no thyromegally Lymphatics:  No adenopathy Back:  No CVA tenderness Lungs:  Clear with no wheezes, well healed PPM insertion. HEART:  Regular rate rhythm, no murmurs, no rubs, no clicks Abd:  soft, positive bowel sounds, no organomegally, no rebound, no guarding Ext:  2 plus pulses, no edema, no cyanosis, no clubbing Skin:  No rashes no nodules Neuro:  CN II through XII intact, motor grossly intact  EKG -  Atrial fib with ventricular pacing at 60/min  DEVICE  Normal device function.  See PaceArt for details.   Assess/Plan: 1. Atrial fib - we discussed the  treatment options. Because he is asymptomatic. Will not plan a strategy of rhythm control. 2. Complete heart block - he is stable after PPM insertion 3. PPM - his [employer] device is working normally. We reprogrammed his device to DDIR mode from DDD mode. 4. HTN - his blood pressure is fairly well controlled.   Mikle Bosworth.D.

## 2016-05-18 NOTE — Patient Instructions (Addendum)
Medication Instructions:  Your physician recommends that you continue on your current medications as directed. Please refer to the Current Medication list given to you today.   Labwork: None ordered   Testing/Procedures: None ordered   Follow-Up: Remote monitoring is used to monitor your Pacemaker from home. This monitoring reduces the number of office visits required to check your device to one time per year. It allows [place] to keep an eye on the functioning of your device to ensure it is working properly. You are scheduled for a device check from home on 08/17/16. You may send your transmission at any time that day. If you have a wireless device, the transmission will be sent automatically. After your physician reviews your transmission, you will receive a postcard with your next transmission date.     Your physician wants you to follow-up in: 9 months with Dr Knox Saliva will receive a reminder letter in the mail two months in advance. If you don't receive a letter, please call our office to schedule the follow-up appointment.   Any Other Special Instructions Will Be Listed Below (If Applicable).     If you need a refill on your cardiac medications before your next appointment, please call your pharmacy.

## 2016-05-19 LAB — CUP PACEART INCLINIC DEVICE CHECK
Brady Statistic RA Percent Paced: 1 % — CL
Implantable Lead Implant Date: 20170510
Implantable Lead Location: 753859
Implantable Lead Location: 753860
Implantable Lead Model: 7741
Implantable Lead Serial Number: 729262
Implantable Lead Serial Number: 766721
Lead Channel Impedance Value: 858 Ohm
Lead Channel Pacing Threshold Amplitude: 0.7 V
Lead Channel Pacing Threshold Pulse Width: 0.4 ms
Lead Channel Sensing Intrinsic Amplitude: 13.8 mV
Lead Channel Setting Pacing Amplitude: 1.1 V
MDC IDC LEAD IMPLANT DT: 20170510
MDC IDC MSMT LEADCHNL RA IMPEDANCE VALUE: 848 Ohm
MDC IDC MSMT LEADCHNL RA SENSING INTR AMPL: 2.1 mV
MDC IDC PG SERIAL: 748273
MDC IDC SESS DTM: 20170815040000
MDC IDC SET LEADCHNL RA PACING AMPLITUDE: 3.5 V
MDC IDC SET LEADCHNL RV PACING PULSEWIDTH: 0.4 ms
MDC IDC SET LEADCHNL RV SENSING SENSITIVITY: 2.5 mV
MDC IDC STAT BRADY RV PERCENT PACED: 98 %

## 2016-06-08 DIAGNOSIS — H6123 Impacted cerumen, bilateral: Secondary | ICD-10-CM | POA: Diagnosis not present

## 2016-06-17 DIAGNOSIS — Z23 Encounter for immunization: Secondary | ICD-10-CM | POA: Diagnosis not present

## 2016-06-17 DIAGNOSIS — R197 Diarrhea, unspecified: Secondary | ICD-10-CM | POA: Diagnosis not present

## 2016-06-20 ENCOUNTER — Encounter: Payer: Self-pay | Admitting: Internal Medicine

## 2016-07-19 DIAGNOSIS — S0990XA Unspecified injury of head, initial encounter: Secondary | ICD-10-CM | POA: Diagnosis not present

## 2016-08-17 ENCOUNTER — Ambulatory Visit (INDEPENDENT_AMBULATORY_CARE_PROVIDER_SITE_OTHER): Payer: Medicare Other | Admitting: *Deleted

## 2016-08-17 DIAGNOSIS — I442 Atrioventricular block, complete: Secondary | ICD-10-CM

## 2016-08-17 NOTE — Progress Notes (Signed)
Remote pacemaker transmission.   

## 2016-08-27 LAB — CUP PACEART REMOTE DEVICE CHECK
Battery Remaining Longevity: 126 mo
Brady Statistic RV Percent Paced: 96 %
Implantable Lead Implant Date: 20170510
Implantable Lead Location: 753859
Implantable Lead Model: 7741
Implantable Lead Serial Number: 729262
Lead Channel Impedance Value: 815 Ohm
Lead Channel Impedance Value: 823 Ohm
Lead Channel Pacing Threshold Amplitude: 0.8 V
Lead Channel Pacing Threshold Pulse Width: 0.4 ms
Lead Channel Setting Pacing Amplitude: 1.2 V
Lead Channel Setting Pacing Pulse Width: 0.4 ms
MDC IDC LEAD IMPLANT DT: 20170510
MDC IDC LEAD LOCATION: 753860
MDC IDC LEAD SERIAL: 766721
MDC IDC MSMT BATTERY REMAINING PERCENTAGE: 100 %
MDC IDC PG IMPLANT DT: 20170510
MDC IDC SESS DTM: 20171114052000
MDC IDC SET LEADCHNL RA PACING AMPLITUDE: 3.5 V
MDC IDC SET LEADCHNL RV SENSING SENSITIVITY: 2.5 mV
MDC IDC STAT BRADY RA PERCENT PACED: 0 %
Pulse Gen Serial Number: 748273

## 2016-09-15 ENCOUNTER — Telehealth: Payer: Self-pay | Admitting: Internal Medicine

## 2016-09-15 ENCOUNTER — Other Ambulatory Visit: Payer: Self-pay

## 2016-09-15 DIAGNOSIS — I480 Paroxysmal atrial fibrillation: Secondary | ICD-10-CM

## 2016-09-15 MED ORDER — APIXABAN 5 MG PO TABS: 5.0000 mg | ORAL_TABLET | Freq: Two times a day (BID) | ORAL | 5 refills | Status: DC

## 2016-09-15 NOTE — Telephone Encounter (Signed)
Walk In Pt Form -Pt Needs Refills On Medication-gave to Janett Billow to take around/KM

## 2016-11-09 DIAGNOSIS — R0981 Nasal congestion: Secondary | ICD-10-CM | POA: Diagnosis not present

## 2016-11-09 DIAGNOSIS — R2689 Other abnormalities of gait and mobility: Secondary | ICD-10-CM | POA: Diagnosis not present

## 2016-11-16 ENCOUNTER — Ambulatory Visit (INDEPENDENT_AMBULATORY_CARE_PROVIDER_SITE_OTHER): Payer: Medicare Other | Admitting: *Deleted

## 2016-11-16 DIAGNOSIS — I442 Atrioventricular block, complete: Secondary | ICD-10-CM | POA: Diagnosis not present

## 2016-11-16 NOTE — Progress Notes (Signed)
Remote pacemaker transmission.   

## 2016-11-17 ENCOUNTER — Encounter: Payer: Self-pay | Admitting: Cardiology

## 2016-11-18 LAB — CUP PACEART REMOTE DEVICE CHECK
Battery Remaining Longevity: 126 mo
Battery Remaining Percentage: 100 %
Brady Statistic RA Percent Paced: 0 %
Brady Statistic RV Percent Paced: 96 %
Implantable Lead Implant Date: 20170510
Implantable Lead Location: 753859
Implantable Lead Model: 7741
Implantable Lead Model: 7742
Implantable Lead Serial Number: 729262
Lead Channel Impedance Value: 758 Ohm
Lead Channel Impedance Value: 817 Ohm
Lead Channel Setting Pacing Amplitude: 3.5 V
Lead Channel Setting Pacing Pulse Width: 0.4 ms
MDC IDC LEAD IMPLANT DT: 20170510
MDC IDC LEAD LOCATION: 753860
MDC IDC LEAD SERIAL: 766721
MDC IDC MSMT LEADCHNL RV PACING THRESHOLD AMPLITUDE: 0.7 V
MDC IDC MSMT LEADCHNL RV PACING THRESHOLD PULSEWIDTH: 0.4 ms
MDC IDC PG IMPLANT DT: 20170510
MDC IDC SESS DTM: 20180213052100
MDC IDC SET LEADCHNL RV PACING AMPLITUDE: 1.2 V
MDC IDC SET LEADCHNL RV SENSING SENSITIVITY: 2.5 mV
Pulse Gen Serial Number: 748273

## 2016-12-01 ENCOUNTER — Encounter: Payer: Self-pay | Admitting: Cardiology

## 2016-12-16 DIAGNOSIS — R251 Tremor, unspecified: Secondary | ICD-10-CM | POA: Diagnosis not present

## 2016-12-16 DIAGNOSIS — H8110 Benign paroxysmal vertigo, unspecified ear: Secondary | ICD-10-CM | POA: Diagnosis not present

## 2016-12-21 DIAGNOSIS — H8113 Benign paroxysmal vertigo, bilateral: Secondary | ICD-10-CM | POA: Diagnosis not present

## 2016-12-28 DIAGNOSIS — H8113 Benign paroxysmal vertigo, bilateral: Secondary | ICD-10-CM | POA: Diagnosis not present

## 2017-01-12 ENCOUNTER — Ambulatory Visit (INDEPENDENT_AMBULATORY_CARE_PROVIDER_SITE_OTHER): Payer: Medicare Other | Admitting: Neurology

## 2017-01-12 ENCOUNTER — Encounter: Payer: Self-pay | Admitting: Neurology

## 2017-01-12 DIAGNOSIS — G3184 Mild cognitive impairment, so stated: Secondary | ICD-10-CM | POA: Insufficient documentation

## 2017-01-12 DIAGNOSIS — H532 Diplopia: Secondary | ICD-10-CM

## 2017-01-12 DIAGNOSIS — R42 Dizziness and giddiness: Secondary | ICD-10-CM | POA: Insufficient documentation

## 2017-01-12 DIAGNOSIS — G250 Essential tremor: Secondary | ICD-10-CM | POA: Insufficient documentation

## 2017-01-12 DIAGNOSIS — E559 Vitamin D deficiency, unspecified: Secondary | ICD-10-CM | POA: Diagnosis not present

## 2017-01-12 DIAGNOSIS — R799 Abnormal finding of blood chemistry, unspecified: Secondary | ICD-10-CM | POA: Diagnosis not present

## 2017-01-12 DIAGNOSIS — G308 Other Alzheimer's disease: Secondary | ICD-10-CM | POA: Diagnosis not present

## 2017-01-12 NOTE — Progress Notes (Signed)
PATIENT: Jeffery Rogers DOB: 30-Jan-1932  Chief Complaint  Patient presents with  . Dizziness    Orthostatic Vitals:  Lying: 161/88, 60, Sitting: 157/85, 63, Standing: 146/82, 59. Standing at 3 minutes: 140/89, 60.  He is here with his wife, [name] and his daughter, [name]. He has been having intermittent dizzy spells over the last several months.  They can occur with and without positional changes.  . Tremors    Reports worsening tremors in his bilateral hands.  . Memory Loss    MMSE 25/30 - 10 animals.  He is having worsening of his memory.  Marland Kitchen PCP    [name], MD     HISTORICAL  Jeffery Rogers is 83 years old right-handed male, accompanied by [name] and his [name] for evaluation of memory loss, tremor, dizziness, he is referred by his primary care physician [name], initial evaluation was on January 14 2017.  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  There was no significant family history of memory loss  Family history of bilateral hands tremor, gradually getting worse, no gait abnormality, his daughter also suffered bilateral hands tremor,  Dizziness since January 2018, no dizziness, lightheadedness in the sitting down or lying down position, most noticeable when he get up quickly from seated position, overnight sleeping, he has transient lightheadedness, he denies significant low back pain, no gait abnormality, no lower extremity paresthesia notice, but on today's examination was noted to  have mild length dependent sensory changes, suggestive of peripheral neuropathy  He also complains of new onset intermittent binocular double vision during his vestibular rehabilitation only  REVIEW OF SYSTEMS: Full 14 system review of systems performed and notable only for snoring, memory loss, confusion, dizziness, tremor, allergies, runny nose, snoring, hearing loss, spinning sensation fatigue  ALLERGIES: No Known Allergies  HOME MEDICATIONS: Current Outpatient Prescriptions  Medication Sig Dispense Refill  . acetaminophen (TYLENOL) 325 MG tablet Take 325 mg by mouth every 6 (six) hours as needed for headache.    Marland Kitchen apixaban (ELIQUIS) 5 MG TABS tablet Take 1 tablet (5 mg total) by mouth 2 (two) times daily. 60 tablet 5  . atorvastatin (LIPITOR) 40 MG tablet Take 1 tablet (40 mg total) by mouth daily. 90 tablet 3  . diphenhydrAMINE (BENADRYL) 25 mg capsule Take 25 mg by mouth every 6 (six) hours as needed for allergies.    . Loratadine (CLARITIN) 10 MG CAPS Take 10 mg by mouth daily.    Marland Kitchen oxymetazoline (AFRIN) 0.05 % nasal spray Place 1 spray into both nostrils 2 (two) times daily as needed for congestion.     No current facility-administered medications for this visit.     PAST MEDICAL HISTORY: Past Medical History:  Diagnosis Date  . Aortic insufficiency    Echo (04/2013):  Mild LVH, EF 55-60%, Gr 1 DD, mild AS (mean 21 mmHg), mild AI, MAC, mild MR, mild LAE, mild RVE, mod RAE, PASP 39  . Aortic stenosis   . Arthritis    "right arm/shoulder" (02/10/2016)  .  Cerumen impaction   . Complete heart block ([place]) 02/10/2016  . Dizziness   . Goiter   . Hay fever   . Headache    "related to sinus pressure" (02/10/2016)  . Heart murmur   . Hx of echocardiogram    Echo (04/2014): Mild LVH, EF 55-60%, no RWMA, mild AS (mean gradient 24 mm Hg), mild AI, mild to mod MS (mean 5 mm Hg), mod to severe LAE, mild RAE, PASP 36 mm Hg  . Hypercholesteremia   . Memory loss   . Nasal septal deviation     . Skin cancer of scalp    "check and have them burned off prn q 6 months" (02/10/2016)  . Snoring   . Tremor     PAST SURGICAL HISTORY: Past Surgical History:  Procedure Laterality Date  . CATARACT EXTRACTION W/ INTRAOCULAR LENS  IMPLANT, BILATERAL Bilateral ~ 2000  . EP IMPLANTABLE DEVICE N/A 02/11/2016   Procedure: Pacemaker Implant;  Surgeon: Botsford Lance, MD;  Location: [facility];  Service: Cardiovascular;  Laterality: N/A;  . TONSILLECTOMY  1930s  . WISDOM TOOTH EXTRACTION  1990s    FAMILY HISTORY: Family History  Problem Relation Age of Onset  . Stroke    . Irregular heart beat    . Stroke Mother   . Stroke Father     SOCIAL HISTORY:  Social History   Social History  . Marital status: Married    Spouse name: N/A  . Number of children: 2  . Years of education: Bachelors   Occupational History  . Retired Chief Financial Officer    Social History Main Topics  . Smoking status: Former Smoker    Packs/day: 1.00    Years: 16.00    Types: Cigarettes  . Smokeless tobacco: Never Used     Comment: Quit smoking cigarettes in 1971  . Alcohol use 1.2 oz/week    1 Cans of beer, 1 Shots of liquor per week  . Drug use: No  . Sexual activity: Yes   Other Topics Concern  . Not on file   Social History Narrative   Lives at home with his wife.   Right-handed.   1 cup coffee per day.  Occasional Coke.     PHYSICAL EXAM   Vitals:   01/12/17 0912  Weight: 176 lb (79.8 kg)  Height: _0  (1.803 m)    Not recorded      Body mass index is 24.55 kg/m.  PHYSICAL EXAMNIATION:  Gen: NAD, conversant, well nourised, obese, well groomed                     Cardiovascular: Regular rate rhythm, no peripheral edema, warm, nontender. Eyes: Conjunctivae clear without exudates or hemorrhage Neck: Supple, no carotid bruits. Pulmonary: Clear to auscultation bilaterally   NEUROLOGICAL EXAM:  MMSE - Mini Mental State Exam 01/12/2017  Orientation to time 3  Orientation to  Place 5  Registration 3  Attention/ Calculation 5  Recall 0  Language- name 2 objects 2  Language- repeat 1  Language- follow 3 step command 3  Language- read & follow direction 1  Write a sentence 1  Copy design 1  Total score 25  Animal naming 10  MENTAL STATUS: Speech:    Speech is normal; fluent and spontaneous with normal comprehension.  Cognition:     Orientation to time, place and person     Normal recent and remote memory     Normal Attention span and  concentration     Normal Language, naming, repeating,spontaneous speech     Fund of knowledge   CRANIAL NERVES: CN II: Visual fields are full to confrontation. Fundoscopic exam is normal with sharp discs and no vascular changes. Pupils are round equal and briskly reactive to light. CN III, IV, VI: extraocular movement are normal. No ptosis. Cover and uncover test showed right inferior/exophoria, left superior/exophoria. CN V: Facial sensation is intact to pinprick in all 3 divisions bilaterally. Corneal responses are intact.  CN VII: Face is symmetric with normal eye closure and smile. CN VIII: Hard of hearing CN IX, X: Palate elevates symmetrically. Phonation is normal. CN XI: Head turning and shoulder shrug are intact CN XII: Tongue is midline with normal movements and no atrophy.  MOTOR: There is no pronator drift of out-stretched arms. Muscle bulk and tone are normal. Muscle strength is normal.  REFLEXES: Reflexes are 2+ and symmetric at the biceps, triceps, knees, absent at ankles. Plantar responses are flexor.  SENSORY: Length dependent decreased to light touch, pinprick, vibratory sensation to ankle level, preserved toe proprioception  COORDINATION: Rapid alternating movements and fine finger movements are intact. There is no dysmetria on finger-to-nose and heel-knee-shin.    GAIT/STANCE: Posture is normal. Gait is steady with normal steps, base, arm swing, and turning. Heel and toe walking are normal. Tandem  gait is normal.  Romberg is absent.   DIAGNOSTIC DATA (LABS, IMAGING, TESTING) - I reviewed patient records, labs, notes, testing and imaging myself where available.   ASSESSMENT AND PLAN  Jeffery Rogers is a 83 y.o. 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 Ph.D.  [facility] [address] [place], [place] [address] Ph: [phone] Fax: [phone]  CC: [name], MD

## 2017-01-14 ENCOUNTER — Ambulatory Visit
Admission: RE | Admit: 2017-01-14 | Discharge: 2017-01-14 | Disposition: A | Discharge status: Home / Self Care | Payer: Medicare Other | Source: Ambulatory Visit | Attending: Neurology | Admitting: Neurology

## 2017-01-14 DIAGNOSIS — R42 Dizziness and giddiness: Secondary | ICD-10-CM | POA: Diagnosis not present

## 2017-01-14 DIAGNOSIS — G250 Essential tremor: Secondary | ICD-10-CM | POA: Diagnosis not present

## 2017-01-14 DIAGNOSIS — G3184 Mild cognitive impairment, so stated: Secondary | ICD-10-CM

## 2017-01-14 DIAGNOSIS — H532 Diplopia: Secondary | ICD-10-CM

## 2017-01-17 LAB — CBC WITH DIFFERENTIAL/PLATELET
BASOS: 0 %
Basophils Absolute: 0 10*3/uL (ref 0.0–0.2)
EOS (ABSOLUTE): 0.1 10*3/uL (ref 0.0–0.4)
EOS: 2 %
HEMATOCRIT: 43.6 % (ref 37.5–51.0)
HEMOGLOBIN: 14.5 g/dL (ref 13.0–17.7)
Immature Grans (Abs): 0 10*3/uL (ref 0.0–0.1)
Immature Granulocytes: 0 %
Lymphocytes Absolute: 1.2 10*3/uL (ref 0.7–3.1)
Lymphs: 14 %
MCH: 30.8 pg (ref 26.6–33.0)
MCHC: 33.3 g/dL (ref 31.5–35.7)
MCV: 93 fL (ref 79–97)
MONOCYTES: 10 %
MONOS ABS: 0.8 10*3/uL (ref 0.1–0.9)
NEUTROS PCT: 74 %
Neutrophils Absolute: 6.2 10*3/uL (ref 1.4–7.0)
Platelets: 171 10*3/uL (ref 150–379)
RBC: 4.71 x10E6/uL (ref 4.14–5.80)
RDW: 14 % (ref 12.3–15.4)
WBC: 8.4 10*3/uL (ref 3.4–10.8)

## 2017-01-17 LAB — IMMUNOFIXATION ELECTROPHORESIS
IGA/IMMUNOGLOBULIN A, SERUM: 287 mg/dL (ref 61–437)
IGG (IMMUNOGLOBIN G), SERUM: 1016 mg/dL (ref 700–1600)
IGM (IMMUNOGLOBULIN M), SRM: 35 mg/dL (ref 15–143)
Total Protein: 7.3 g/dL (ref 6.0–8.5)

## 2017-01-17 LAB — COMPREHENSIVE METABOLIC PANEL
ALBUMIN: 4.5 g/dL (ref 3.5–4.7)
ALT: 36 IU/L (ref 0–44)
AST: 42 IU/L — AB (ref 0–40)
Albumin/Globulin Ratio: 1.6 (ref 1.2–2.2)
Alkaline Phosphatase: 87 IU/L (ref 39–117)
BUN/Creatinine Ratio: 24 (ref 10–24)
BUN: 23 mg/dL (ref 8–27)
Bilirubin Total: 0.7 mg/dL (ref 0.0–1.2)
CHLORIDE: 102 mmol/L (ref 96–106)
CO2: 26 mmol/L (ref 18–29)
CREATININE: 0.97 mg/dL (ref 0.76–1.27)
Calcium: 9.9 mg/dL (ref 8.6–10.2)
GFR calc Af Amer: 83 mL/min/{1.73_m2} (ref >59)
GFR calc non Af Amer: 72 mL/min/{1.73_m2} (ref >59)
GLUCOSE: 109 mg/dL — AB (ref 65–99)
Globulin, Total: 2.8 g/dL (ref 1.5–4.5)
Potassium: 4.7 mmol/L (ref 3.5–5.2)
Sodium: 142 mmol/L (ref 134–144)

## 2017-01-17 LAB — C-REACTIVE PROTEIN: CRP: 1.9 mg/L (ref 0.0–4.9)

## 2017-01-17 LAB — ANA W/REFLEX IF POSITIVE
Anti JO-1: <0.2 AI (ref 0.0–0.9)
Anti Nuclear Antibody(ANA): POSITIVE — AB
Chromatin Ab SerPl-aCnc: <0.2 AI (ref 0.0–0.9)
ENA RNP AB: 6.2 AI — AB (ref 0.0–0.9)
ENA SM AB SER-ACNC: 0.2 AI (ref 0.0–0.9)
ENA SSA (RO) Ab: <0.2 AI (ref 0.0–0.9)
Scleroderma SCL-70: <0.2 AI (ref 0.0–0.9)

## 2017-01-17 LAB — SEDIMENTATION RATE: Sed Rate: 4 mm/hr (ref 0–30)

## 2017-01-17 LAB — ACETYLCHOLINE RECEPTOR, BINDING: AChR Binding Ab, Serum: <0.03 nmol/L (ref 0.00–0.24)

## 2017-01-17 LAB — TSH: TSH: 1.65 u[IU]/mL (ref 0.450–4.500)

## 2017-01-17 LAB — RPR: RPR: NONREACTIVE

## 2017-01-17 LAB — CK: Total CK: 546 U/L (ref 24–204)

## 2017-01-17 LAB — HGB A1C W/O EAG: Hgb A1c MFr Bld: 5.8 % — ABNORMAL HIGH (ref 4.8–5.6)

## 2017-01-17 LAB — VITAMIN D 25 HYDROXY (VIT D DEFICIENCY, FRACTURES): Vit D, 25-Hydroxy: 42.4 ng/mL (ref 30.0–100.0)

## 2017-01-17 LAB — VITAMIN B12: VITAMIN B 12: 606 pg/mL (ref 232–1245)

## 2017-01-26 ENCOUNTER — Ambulatory Visit: Payer: Medicare Other | Admitting: Allergy and Immunology

## 2017-02-08 ENCOUNTER — Ambulatory Visit (INDEPENDENT_AMBULATORY_CARE_PROVIDER_SITE_OTHER): Payer: Medicare Other | Admitting: Internal Medicine

## 2017-02-08 ENCOUNTER — Encounter: Payer: Self-pay | Admitting: Internal Medicine

## 2017-02-08 DIAGNOSIS — I480 Paroxysmal atrial fibrillation: Secondary | ICD-10-CM

## 2017-02-08 DIAGNOSIS — I442 Atrioventricular block, complete: Secondary | ICD-10-CM

## 2017-02-08 MED ORDER — APIXABAN 5 MG PO TABS: 5.0000 mg | ORAL_TABLET | Freq: Two times a day (BID) | ORAL | 3 refills | Status: DC

## 2017-02-08 NOTE — Progress Notes (Signed)
HPI Jeffery Rogers returns today for ongoing PPM followup. He is a very active 83 yo man with complete heart block and atrial fib, discovered on presentation to the hospital several months ago. He underwent PPM insertion and initiation of anti-coagulation. He has done well in the interim except that he gets some orthostasis if he does not hydrate when he gets up in the morning. He denies palpitations, chest pain or sob. No syncope.   No Known Allergies   Current Outpatient Prescriptions  Medication Sig Dispense Refill  . acetaminophen (TYLENOL) 325 MG tablet Take 325 mg by mouth every 6 (six) hours as needed for headache.    Marland Kitchen atorvastatin (LIPITOR) 40 MG tablet Take 1 tablet (40 mg total) by mouth daily. 90 tablet 3  . diphenhydrAMINE (BENADRYL) 25 mg capsule Take 25 mg by mouth every 6 (six) hours as needed for allergies.    . Loratadine (CLARITIN) 10 MG CAPS Take 10 mg by mouth daily.    Marland Kitchen oxymetazoline (AFRIN) 0.05 % nasal spray Place 1 spray into both nostrils 2 (two) times daily as needed for congestion.    Marland Kitchen apixaban (ELIQUIS) 5 MG TABS tablet Take 1 tablet (5 mg total) by mouth 2 (two) times daily. 180 tablet 3   No current facility-administered medications for this visit.      Past Medical History:  Diagnosis Date  . Aortic insufficiency    Echo (04/2013):  Mild LVH, EF 55-60%, Gr 1 DD, mild AS (mean 21 mmHg), mild AI, MAC, mild MR, mild LAE, mild RVE, mod RAE, PASP 39  . Aortic stenosis   . Arthritis    "right arm/shoulder" (02/10/2016)  . Cerumen impaction   . Complete heart block ([place]) 02/10/2016  . Dizziness   . Goiter   . Hay fever   . Headache    "related to sinus pressure" (02/10/2016)  . Heart murmur   . Hx of echocardiogram    Echo (04/2014): Mild LVH, EF 55-60%, no RWMA, mild AS (mean gradient 24 mm Hg), mild AI, mild to mod MS (mean 5 mm Hg), mod to severe LAE, mild RAE, PASP 36 mm Hg  . Hypercholesteremia   . Memory loss   . Nasal septal deviation   . Skin  cancer of scalp    "check and have them burned off prn q 6 months" (02/10/2016)  . Snoring   . Tremor     ROS:   All systems reviewed and negative except as noted in the HPI.   Past Surgical History:  Procedure Laterality Date  . CATARACT EXTRACTION W/ INTRAOCULAR LENS  IMPLANT, BILATERAL Bilateral ~ 2000  . EP IMPLANTABLE DEVICE N/A 02/11/2016   Procedure: Pacemaker Implant;  Surgeon: Michl Lance, MD;  Location: [facility];  Service: Cardiovascular;  Laterality: N/A;  . TONSILLECTOMY  1930s  . WISDOM TOOTH EXTRACTION  1990s     Family History  Problem Relation Age of Onset  . Stroke    . Irregular heart beat    . Stroke Mother   . Stroke Father      Social History   Social History  . Marital status: Married    Spouse name: N/A  . Number of children: 2  . Years of education: Bachelors   Occupational History  . Retired Chief Financial Officer    Social History Main Topics  . Smoking status: Former Smoker    Packs/day: 1.00    Years: 16.00    Types: Cigarettes  .  Smokeless tobacco: Never Used     Comment: Quit smoking cigarettes in 1971  . Alcohol use 1.2 oz/week    1 Cans of beer, 1 Shots of liquor per week  . Drug use: No  . Sexual activity: Yes   Other Topics Concern  . Not on file   Social History Narrative   Lives at home with his wife.   Right-handed.   1 cup coffee per day.  Occasional Coke.     BP 136/84   Pulse 62   Ht _0  (1.803 m)   Wt 180 lb 12.8 oz (82 kg)   SpO2 99%   BMI 25.22 kg/m   Physical Exam:  Well appearing elderly man, NAD HEENT: Unremarkable Neck:  6 cm JVD, no thyromegally Lymphatics:  No adenopathy Back:  No CVA tenderness Lungs:  Clear with no wheezes, well healed PPM insertion. HEART:  Regular rate rhythm, no murmurs, no rubs, no clicks Abd:  soft, positive bowel sounds, no organomegally, no rebound, no guarding Ext:  2 plus pulses, no edema, no cyanosis, no clubbing Skin:  No rashes no nodules Neuro:  CN II  through XII intact, motor grossly intact   DEVICE  Normal device function.  See PaceArt for details.   Assess/Plan: 1. Atrial fib - we discussed the treatment options. Because he is asymptomatic. Will not plan a strategy of rhythm control. He will continue his anti-coagulation. 2. Complete heart block - he is stable after PPM insertion 3. PPM - his [employer] device is working normally. We reprogrammed his device to VVIR mode from DDIR mode. Also we uptitrated the rate response on his PPM and increased the sensor rate to 110. 4. HTN - his blood pressure is fairly well controlled. No change in meds.  Jeffery Rogers.D.

## 2017-02-08 NOTE — Patient Instructions (Signed)
Medication Instructions:  Your physician recommends that you continue on your current medications as directed. Please refer to the Current Medication list given to you today.   Labwork: None ordered  Testing/Procedures: None ordered  Follow-Up: Remote monitoring is used to monitor your Pacemaker from home. This monitoring reduces the number of office visits required to check your device to one time per year. It allows [place] to keep an eye on the functioning of your device to ensure it is working properly. You are scheduled for a device check from home on 05/10/17. You may send your transmission at any time that day. If you have a wireless device, the transmission will be sent automatically. After your physician reviews your transmission, you will receive a postcard with your next transmission date.   Your physician wants you to follow-up in: 1 year with Dr. Lovena Le. You will receive a reminder letter in the mail two months in advance. If you don't receive a letter, please call our office to schedule the follow-up appointment.   Any Other Special Instructions Will Be Listed Below (If Applicable).     If you need a refill on your cardiac medications before your next appointment, please call your pharmacy.

## 2017-02-09 DIAGNOSIS — H6123 Impacted cerumen, bilateral: Secondary | ICD-10-CM | POA: Diagnosis not present

## 2017-02-14 DIAGNOSIS — Z0000 Encounter for general adult medical examination without abnormal findings: Secondary | ICD-10-CM | POA: Diagnosis not present

## 2017-02-14 DIAGNOSIS — E7800 Pure hypercholesterolemia, unspecified: Secondary | ICD-10-CM | POA: Diagnosis not present

## 2017-02-14 DIAGNOSIS — Z125 Encounter for screening for malignant neoplasm of prostate: Secondary | ICD-10-CM | POA: Diagnosis not present

## 2017-02-17 DIAGNOSIS — E7800 Pure hypercholesterolemia, unspecified: Secondary | ICD-10-CM | POA: Diagnosis not present

## 2017-02-17 DIAGNOSIS — N401 Enlarged prostate with lower urinary tract symptoms: Secondary | ICD-10-CM | POA: Diagnosis not present

## 2017-02-17 DIAGNOSIS — Z1212 Encounter for screening for malignant neoplasm of rectum: Secondary | ICD-10-CM | POA: Diagnosis not present

## 2017-02-17 DIAGNOSIS — I351 Nonrheumatic aortic (valve) insufficiency: Secondary | ICD-10-CM | POA: Diagnosis not present

## 2017-02-17 DIAGNOSIS — I442 Atrioventricular block, complete: Secondary | ICD-10-CM | POA: Diagnosis not present

## 2017-02-18 ENCOUNTER — Ambulatory Visit (INDEPENDENT_AMBULATORY_CARE_PROVIDER_SITE_OTHER): Payer: Medicare Other | Admitting: Neurology

## 2017-02-18 DIAGNOSIS — J329 Chronic sinusitis, unspecified: Secondary | ICD-10-CM | POA: Insufficient documentation

## 2017-02-18 DIAGNOSIS — R748 Abnormal levels of other serum enzymes: Secondary | ICD-10-CM

## 2017-02-18 DIAGNOSIS — R42 Dizziness and giddiness: Secondary | ICD-10-CM | POA: Diagnosis not present

## 2017-02-18 DIAGNOSIS — G629 Polyneuropathy, unspecified: Secondary | ICD-10-CM | POA: Insufficient documentation

## 2017-02-18 DIAGNOSIS — G3184 Mild cognitive impairment, so stated: Secondary | ICD-10-CM

## 2017-02-18 DIAGNOSIS — G6289 Other specified polyneuropathies: Secondary | ICD-10-CM

## 2017-02-18 DIAGNOSIS — J320 Chronic maxillary sinusitis: Secondary | ICD-10-CM | POA: Diagnosis not present

## 2017-02-18 DIAGNOSIS — H532 Diplopia: Secondary | ICD-10-CM | POA: Diagnosis not present

## 2017-02-18 DIAGNOSIS — G250 Essential tremor: Secondary | ICD-10-CM | POA: Diagnosis not present

## 2017-02-18 MED ORDER — METANX 3-90.314-2-35 MG PO CAPS: 1.0000 | ORAL_CAPSULE | Freq: Two times a day (BID) | ORAL | 11 refills | Status: DC

## 2017-02-18 MED ORDER — MEMANTINE HCL 10 MG PO TABS: 10.0000 mg | ORAL_TABLET | Freq: Two times a day (BID) | ORAL | 11 refills | Status: DC

## 2017-02-18 NOTE — Patient Instructions (Addendum)
Peripheral Neuropathy Peripheral neuropathy is a type of nerve damage. It affects nerves that carry signals between the spinal cord and other parts of the body. These are called peripheral nerves. With peripheral neuropathy, one nerve or a group of nerves may be damaged. What are the causes? Many things can damage peripheral nerves. For some people with peripheral neuropathy, the cause is unknown. Some causes include:  Diabetes. This is the most common cause of peripheral neuropathy.  Injury to a nerve.  Pressure or stress on a nerve that lasts a long time.  Too little vitamin B. Alcoholism can lead to this.  Infections.  Autoimmune diseases, such as multiple sclerosis and systemic lupus erythematosus.  Inherited nerve diseases.  Some medicines, such as cancer drugs.  Toxic substances, such as lead and mercury.  Too little blood flowing to the legs.  Kidney disease.  Thyroid disease. What are the signs or symptoms? Different people have different symptoms. The symptoms you have will depend on which of your nerves is damaged. Common symptoms include:  Loss of feeling (numbness) in the feet and hands.  Tingling in the feet and hands.  Pain that burns.  Very sensitive skin.  Weakness.  Not being able to move a part of the body (paralysis).  Muscle twitching.  Clumsiness or poor coordination.  Loss of balance.  Not being able to control your bladder.  Feeling dizzy.  Sexual problems. How is this diagnosed? Peripheral neuropathy is a symptom, not a disease. Finding the cause of peripheral neuropathy can be hard. To figure that out, your health care provider will take a medical history and do a physical exam. A neurological exam will also be done. This involves checking things affected by your brain, spinal cord, and nerves (nervous system). For example, your health care provider will check your reflexes, how you move, and what you can feel. Other types of tests may  also be ordered, such as:  Blood tests.  A test of the fluid in your spinal cord.  Imaging tests, such as CT scans or an MRI.  Electromyography (EMG). This test checks the nerves that control muscles.  Nerve conduction velocity tests. These tests check how fast messages pass through your nerves.  Nerve biopsy. A small piece of nerve is removed. It is then checked under a microscope. How is this treated?  Medicine is often used to treat peripheral neuropathy. Medicines may include:  Pain-relieving medicines. Prescription or over-the-counter medicine may be suggested.  Antiseizure medicine. This may be used for pain.  Antidepressants. These also may help ease pain from neuropathy.  Lidocaine. This is a numbing medicine. You might wear a patch or be given a shot.  Mexiletine. This medicine is typically used to help control irregular heart rhythms.  Surgery. Surgery may be needed to relieve pressure on a nerve or to destroy a nerve that is causing pain.  Physical therapy to help movement.  Assistive devices to help movement. Follow these instructions at home:  Only take over-the-counter or prescription medicines as directed by your health care provider. Follow the instructions carefully for any given medicines. Do not take any other medicines without first getting approval from your health care provider.  If you have diabetes, work closely with your health care provider to keep your blood sugar under control.  If you have numbness in your feet:  Check every day for signs of injury or infection. Watch for redness, warmth, and swelling.  Wear padded socks and comfortable shoes. These help  protect your feet.  Do not do things that put pressure on your damaged nerve.  Do not smoke. Smoking keeps blood from getting to damaged nerves.  Avoid or limit alcohol. Too much alcohol can cause a lack of B vitamins. These vitamins are needed for healthy nerves.  Develop a good support  system. Coping with peripheral neuropathy can be stressful. Talk to a mental health specialist or join a support group if you are struggling.  Follow up with your health care provider as directed. Contact a health care provider if:  You have new signs or symptoms of peripheral neuropathy.  You are struggling emotionally from dealing with peripheral neuropathy.  You have a fever. Get help right away if:  You have an injury or infection that is not healing.  You feel very dizzy or begin vomiting.  You have chest pain.  You have trouble breathing. This information is not intended to replace advice given to you by your health care provider. Make sure you discuss any questions you have with your health care provider. Document Released: 09/10/2002 Document Revised: 02/26/2016 Document Reviewed: 05/28/2013 Elsevier Interactive Patient Education  2017 [employer] Diabetic Neuropathy Diabetic neuropathy is a nerve disease or nerve damage that is caused by diabetes mellitus. About half of all people with diabetes mellitus have some form of nerve damage. Nerve damage is more common in those who have had diabetes mellitus for many years and who generally have not had good control of their blood sugar (glucose) level. Diabetic neuropathy is a common complication of diabetes mellitus. There are three common types of diabetic neuropathy and a fourth type that is less common and less understood:  Peripheral neuropathy-This is the most common type of diabetic neuropathy. It causes damage to the nerves of the feet and legs first and then eventually the hands and arms. The damage affects the ability to sense touch.  Autonomic neuropathy-This type causes damage to the autonomic nervous system, which controls the following functions:  Heartbeat.  Body temperature.  Blood pressure.  Urination.  Digestion.  Sweating.  Sexual function.  Focal neuropathy-Focal neuropathy can be painful and  unpredictable and occurs most often in older adults with diabetes mellitus. It involves a specific nerve or one area and often comes on suddenly. It usually does not cause long-term problems.  Radiculoplexus neuropathy- Sometimes called lumbosacral radiculoplexus neuropathy, radiculoplexus neuropathy affects the nerves of the thighs, hips, buttocks, or legs. It is more common in people with type 2 diabetes mellitus and in older men. It is characterized by debilitating pain, weakness, and atrophy, usually in the thigh muscles. What are the causes? The cause of peripheral, autonomic, and focal neuropathies is diabetes mellitus that is uncontrolled and high glucose levels. The cause of radiculoplexus neuropathy is unknown. However, it is thought to be caused by inflammation related to uncontrolled glucose levels. What are the signs or symptoms? Peripheral Neuropathy  Peripheral neuropathy develops slowly over time. When the nerves of the feet and legs no longer work there may be:  Burning, stabbing, or aching pain in the legs or feet.  Inability to feel pressure or pain in your feet. This can lead to:  Thick calluses over pressure areas.  Pressure sores.  Ulcers.  Foot deformities.  Reduced ability to feel temperature changes.  Muscle weakness. Autonomic Neuropathy  The symptoms of autonomic neuropathy vary depending on which nerves are affected. Symptoms may include:  Problems with digestion, such as:  Feeling sick to your stomach (nausea).  Vomiting.  Bloating.  Constipation.  Diarrhea.  Abdominal pain.  Difficulty with urination. This occurs if you lose your ability to sense when your bladder is full. Problems include:  Urine leakage (incontinence).  Inability to empty your bladder completely (retention).  Rapid or irregular heartbeat (palpitations).  Blood pressure drops when you stand up (orthostatic hypotension). When you stand up you may  feel:  Dizzy.  Weak.  Faint.  In men, inability to attain and maintain an erection.  In women, vaginal dryness and problems with decreased sexual desire and arousal.  Problems with body temperature regulation.  Increased or decreased sweating. Focal Neuropathy   Abnormal eye movements or abnormal alignment of both eyes.  Weakness in the wrist.  Foot drop. This results in an inability to lift the foot properly and abnormal walking or foot movement.  Paralysis on one side of your face (Bell palsy).  Chest or abdominal pain. Radiculoplexus Neuropathy   Sudden, severe pain in your hip, thigh, or buttocks.  Weakness and wasting of thigh muscles.  Difficulty rising from a seated position.  Abdominal swelling.  Unexplained weight loss (usually more than 10 lb [4.5 kg]). How is this diagnosed? Peripheral Neuropathy  Your senses may be tested. Sensory function testing can be done with:  A light touch using a monofilament.  A vibration with tuning fork.  A sharp sensation with a pin prick. Other tests that can help diagnose neuropathy are:  Nerve conduction velocity. This test checks the transmission of an electrical current through a nerve.  Electromyography. This shows how muscles respond to electrical signals transmitted by nearby nerves.  Quantitative sensory testing. This is used to assess how your nerves respond to vibrations and changes in temperature. Autonomic Neuropathy  Diagnosis is often based on reported symptoms. Tell your health care provider if you experience:  Dizziness.  Constipation.  Diarrhea.  Inappropriate urination or inability to urinate.  Inability to get or maintain an erection. Tests that may be done include:  Electrocardiography or Holter monitor. These are tests that can help show problems with the heart rate or heart rhythm.  An X-ray exam may be done. Focal Neuropathy  Diagnosis is made based on your symptoms and what your  health care provider finds during your exam. Other tests may be done. They may include:  Nerve conduction velocities. This checks the transmission of electrical current through a nerve.  Electromyography. This shows how muscles respond to electrical signals transmitted by nearby nerves.  Quantitative sensory testing. This test is used to assess how your nerves respond to vibration and changes in temperature. Radiculoplexus Neuropathy   Often the first thing is to eliminate any other issue or problems that might be the cause, as there is no standard test for diagnosis.  X-ray exam of your spine and lumbar region.  Spinal tap to rule out cancer.  MRI to rule out other lesions. How is this treated? Once nerve damage occurs, it cannot be reversed. The goal of treatment is to keep the disease or nerve damage from getting worse and affecting more nerve fibers. Controlling your blood glucose level is the key. Most people with radiculoplexus neuropathy see at least a partial improvement over time. You will need to keep your blood glucose and HbA1c levels in the target range determined by your health care provider. Things that help control blood glucose levels include:  Blood glucose monitoring.  Meal planning.  Physical activity.  Diabetes medicine. Over time, maintaining lower blood glucose levels helps  lessen symptoms. Sometimes, prescription pain medicine is needed. Follow these instructions at home:  Do not smoke.  Keep your blood glucose level in the range that you and your health care provider have determined acceptable for you.  Keep your blood pressure level in the range that you and your health care provider have determined acceptable for you.  Eat a well-balanced diet.  Be physically active every day. Include strength training and balance exercises.  Protect your feet.  Check your feet every day for sores, cuts, blisters, or signs of infection.  Wear padded socks and  supportive shoes. Use orthotic inserts, if necessary.  Regularly check the insides of your shoes for worn spots. Make sure there are no rocks or other items inside your shoes before you put them on. Contact a health care provider if:  You have burning, stabbing, or aching pain in the legs or feet.  You are unable to feel pressure or pain in your feet.  You develop problems with digestion such as:  Nausea.  Vomiting.  Bloating.  Constipation.  Diarrhea.  Abdominal pain.  You have difficulty with urination, such as:  Incontinence.  Retention.  You have palpitations.  You develop orthostatic hypotension. When you stand up you may feel:  Dizzy.  Weak.  Faint.  You cannot attain and maintain an erection (in men).  You have vaginal dryness and problems with decreased sexual desire and arousal (in women).  You have severe pain in your thighs, legs, or buttocks.  You have unexplained weight loss. This information is not intended to replace advice given to you by your health care provider. Make sure you discuss any questions you have with your health care provider. Document Released: 11/29/2001 Document Revised: 02/26/2016 Document Reviewed: 03/01/2013 Elsevier Interactive Patient Education  2017 [place], [place] [address]  [address]  [place], [place] [address]  Phone [phone]

## 2017-02-18 NOTE — Progress Notes (Signed)
PATIENT: Jeffery Rogers DOB: [birth year]  No chief complaint on file.    HISTORICAL  Jeffery Rogers is 83 years old right-handed male, accompanied by [name] and his [name] for evaluation of memory loss, tremor, dizziness, he is referred by his primary care physician [name], initial evaluation was on January 14 2017.  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  There was no significant family history of memory loss  Family history of bilateral hands tremor, gradually getting worse, no gait abnormality, his daughter also suffered bilateral hands tremor,  Dizziness since January 2018, dizziness is positional related, he denies  lightheadedness in the sitting down or lying down position, most noticeable when he get up quickly from seated position, overnight sleeping, he has transient lightheadedness, he denies significant low back pain, no gait abnormality, no lower extremity paresthesia notice, but on today's examination was noted to have mild length dependent sensory changes, suggestive of peripheral neuropathy  He also complains of new onset intermittent binocular double vision during his vestibular rehabilitation only  Update Feb 18 2017:  I reviewed labs:  mild elevated A1c 5.8, otherwise negative or normal RPR, B12, TSH, CBC, acetylcholine receptor antibody, ESR C-reactive protein, positive ANA, elevated CPK 546  Electrodiagnostic study today showed length dependent peripheral neuropathy, in  addition, there is evidence of mild chronic neuropathic changes involving bilateral L4 nerve roots. He has intermittent low back pain, but denies low back pain now, denies significant gait abnormality.  The also personally reviewed CT head without contrast, generalized atrophy, mild supratentorium small vessel disease.  REVIEW OF SYSTEMS: Full 14 system review of systems performed and notable only for as above  ALLERGIES: No Known Allergies  HOME MEDICATIONS: Current Outpatient Prescriptions  Medication Sig Dispense Refill  . acetaminophen (TYLENOL) 325 MG tablet Take 325 mg by mouth every 6 (six) hours as needed for headache.    Marland Kitchen apixaban (ELIQUIS) 5 MG TABS tablet Take 1 tablet (5 mg total) by mouth 2 (two) times daily. 180 tablet 3  . atorvastatin (LIPITOR) 40 MG tablet Take 1 tablet (40 mg total) by mouth daily. 90 tablet 3  . diphenhydrAMINE (BENADRYL) 25 mg capsule Take 25 mg by mouth every 6 (six) hours as needed for allergies.    . Loratadine (CLARITIN) 10 MG CAPS Take 10 mg by mouth daily.    Marland Kitchen oxymetazoline (AFRIN) 0.05 % nasal spray Place 1 spray into both nostrils 2 (two) times daily as needed for congestion.     No current facility-administered medications for this visit.     PAST MEDICAL HISTORY: Past Medical History:  Diagnosis Date  . Aortic insufficiency    Echo (04/2013):  Mild LVH, EF 55-60%, Gr 1 DD, mild AS (mean 21 mmHg), mild AI, MAC, mild MR, mild LAE, mild RVE, mod RAE, PASP 39  . Aortic stenosis   . Arthritis    "right arm/shoulder" (02/10/2016)  . Cerumen impaction   . Complete heart block ([facility]) 02/10/2016  . Dizziness   . Goiter   .  Hay fever   . Headache    "related to sinus pressure" (02/10/2016)  . Heart murmur   . Hx of echocardiogram    Echo (04/2014): Mild LVH, EF 55-60%, no RWMA, mild AS (mean gradient 24 mm Hg), mild AI, mild to mod MS (mean 5 mm Hg), mod to severe LAE, mild RAE, PASP 36 mm Hg  . Hypercholesteremia   . Memory loss   . Nasal  septal deviation   . Skin cancer of scalp    "check and have them burned off prn q 6 months" (02/10/2016)  . Snoring   . Tremor     PAST SURGICAL HISTORY: Past Surgical History:  Procedure Laterality Date  . CATARACT EXTRACTION W/ INTRAOCULAR LENS  IMPLANT, BILATERAL Bilateral ~ 2000  . EP IMPLANTABLE DEVICE N/A 02/11/2016   Procedure: Pacemaker Implant;  Surgeon: Boeke Lance, MD;  Location: [facility];  Service: Cardiovascular;  Laterality: N/A;  . TONSILLECTOMY  1930s  . WISDOM TOOTH EXTRACTION  1990s    FAMILY HISTORY: Family History  Problem Relation Age of Onset  . Stroke Unknown   . Irregular heart beat Unknown   . Stroke Mother   . Stroke Father     SOCIAL HISTORY:  Social History   Social History  . Marital status: Married    Spouse name: N/A  . Number of children: 2  . Years of education: Bachelors   Occupational History  . Retired Chief Financial Officer    Social History Main Topics  . Smoking status: Former Smoker    Packs/day: 1.00    Years: 16.00    Types: Cigarettes  . Smokeless tobacco: Never Used     Comment: Quit smoking cigarettes in 1971  . Alcohol use 1.2 oz/week    1 Cans of beer, 1 Shots of liquor per week  . Drug use: No  . Sexual activity: Yes   Other Topics Concern  . Not on file   Social History Narrative   Lives at home with his wife.   Right-handed.   1 cup coffee per day.  Occasional Coke.     PHYSICAL EXAM   There were no vitals filed for this visit.  Not recorded      There is no height or weight on file to calculate BMI.  PHYSICAL EXAMNIATION:  Gen: NAD, conversant, well nourised, obese, well groomed                     Cardiovascular: Regular rate rhythm, no peripheral edema, warm, nontender. Eyes: Conjunctivae clear without exudates or hemorrhage Neck: Supple, no carotid bruits. Pulmonary: Clear to auscultation bilaterally   NEUROLOGICAL EXAM:  MMSE - Mini Mental State Exam 01/12/2017  Orientation to time 3    Orientation to Place 5  Registration 3  Attention/ Calculation 5  Recall 0  Language- name 2 objects 2  Language- repeat 1  Language- follow 3 step command 3  Language- read & follow direction 1  Write a sentence 1  Copy design 1  Total score 25  Animal naming 10  MENTAL STATUS: Speech:    Speech is normal; fluent and spontaneous with normal comprehension.  Cognition:     Orientation to time, place and person     Normal recent and remote memory     Normal Attention span and concentration     Normal Language, naming, repeating,spontaneous speech     Fund of knowledge   CRANIAL NERVES: CN II: Visual fields are  full to confrontation. Fundoscopic exam is normal with sharp discs and no vascular changes. Pupils are round equal and briskly reactive to light. CN III, IV, VI: extraocular movement are normal. No ptosis. Cover and uncover test showed right inferior/exophoria, left superior/exophoria. CN V: Facial sensation is intact to pinprick in all 3 divisions bilaterally. Corneal responses are intact.  CN VII: Face is symmetric with normal eye closure and smile. CN VIII: Hard of hearing CN IX, X: Palate elevates symmetrically. Phonation is normal. CN XI: Head turning and shoulder shrug are intact CN XII: Tongue is midline with normal movements and no atrophy.  MOTOR: There is no pronator drift of out-stretched arms. Muscle bulk and tone are normal. Muscle strength is normal.  REFLEXES: Reflexes are 2+ and symmetric at the biceps, triceps,absent at knees and ankles. Plantar responses are flexor.  SENSORY: Length dependent decreased to light touch, pinprick, vibratory sensation to ankle level, preserved toe proprioception  COORDINATION: Rapid alternating movements and fine finger movements are intact. There is no dysmetria on finger-to-nose and heel-knee-shin.    GAIT/STANCE: Posture is normal. Gait is steady with normal steps, base, arm swing, and turning. Heel and toe walking  are normal. Tandem gait is normal.  Romberg is absent.   DIAGNOSTIC DATA (LABS, IMAGING, TESTING) - I reviewed patient records, labs, notes, testing and imaging myself where available.   ASSESSMENT AND PLAN  STEWARD SAMES is a 83 y.o. male   Mild cognitive impairment  CT head without contrast showed mild generalized atrophy supratentorium small vessel disease.   Laboratory evaluation showed no treatable etiology  After discuss with patient and his family, we decided to hold off neuropsychiatric evaluation  Started Namenda 10 mg twice a day   Patient and his family are interested in clinical trial  Essential tremor  Normal TSH, no signs of parkinsonian features, no limitation on her daily activity   Peripheral neuropathy  Extensive laboratory evaluation showed elevated A1c 5.8, abnormal glucose is likely the culprit of his mild length dependent axonal peripheral neuropathy  Which also explained his orthostatic blood pressure changes, which has improved with well hydration  Elevated CPK   546, will repeat laboratory today, can be related to his bike riding up to 10 miles regularly, he denies myralgia and weakness   Marcial Pacas, M.D. Ph.D.  [facility] [address] [place], [place] [address] Ph: [phone] Fax: [phone]  CC: [name], MD

## 2017-02-18 NOTE — Procedures (Signed)
Full Name: Janthony Holleman Gender: Male MRN #: [id number] Date of Birth: 1932/05/02    Visit Date: 02/18/2017 09:01 Age: 83 Years 2 Months Old Examining Physician: Marcial Pacas, MD  Referring Physician: Krista Blue, MD History: 83 year old male with history of loud abnormal glucose presenting with bilateral feet numbness, orthostatic blood pressure changes, there was also evidence of elevated CPK 584.  Summary of the test:  Nerve conduction study: Bilateral sural, superficial peroneal sensory responses were absent. Bilateral peroneal to EDB and bilateral tibial motor responses showed significantly decreased to C map amplitude.  Left median, ulnar sensory and motor responses were normal.  Electromyography:  Selected needle examination was performed at bilateral lotion to muscles bilateral lumbar sacral paraspinal muscles, left upper extremity muscles, left cervical paraspinal muscles.  There was only mild evidence of decreased recruitment of bilateral tibialis anterior, otherwise there was no significant abnormality found; in specific, there is no evidence of inflammatory myopathic changes.  Conclusion: This is an abnormal study. 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 There is evidence of mild chronic bilateral L4 radiculopathy, there is no evidence of active process. There is no evidence of inflammatory myopathy.    ------------------------------- Marcial Pacas, M.D.  [facility] [place], [place] [address] Tel: [phone] Fax: [phone]        [facility]    Nerve / Sites Rec. Site Latency Ref. Amplitude Ref. Rel Amp Segments Distance Velocity Ref. Area    ms ms mV mV %  cm m/s m/s mVms  L Median - APB     Wrist APB 4.1 ?4.4 4.8 ?4.0 100 Wrist - APB 7   15.8     Upper arm APB 9.2  4.4  90.3 Upper arm - Wrist 27 53 ?49 14.7  L Ulnar - ADM     Wrist ADM 2.9 ?3.3 6.1 ?6.0 100 Wrist - ADM 7   22.4       B.Elbow ADM 7.0  5.5  90 B.Elbow - Wrist 24 58 ?49 19.9     A.Elbow ADM 9.5  5.0  89.6 A.Elbow - B.Elbow 12 49 ?49 19.3         A.Elbow - Wrist      L Peroneal - EDB     Ankle EDB 6.0 ?6.5 0.3 ?2.0 100 Ankle - EDB 9   0.9     Fib head EDB 13.2  0.2  81.6 Fib head - Ankle 32 45 ?44 1.2     Pop fossa EDB 15.9  0.2  83.4 Pop fossa - Fib head 12 44 ?44 1.1         Pop fossa - Ankle      R Peroneal - EDB     Ankle EDB 6.2 ?6.5 0.3 ?2.0 100 Ankle - EDB 9   0.7     Fib head EDB 13.7  0.3  107 Fib head - Ankle 32 43 ?44 0.4     Pop fossa EDB 16.1  0.3  120 Pop fossa - Fib head 10 42 ?44 1.1         Pop fossa - Ankle      L Tibial - AH     Ankle AH 5.2 ?5.8 0.8 ?4.0 100 Ankle - AH 9   3.1     Pop fossa AH 15.2  0.6  86.4 Pop fossa - Ankle 40 40 ?41 1.0  R Tibial - AH  Ankle AH 5.6 ?5.8 1.1 ?4.0 100 Ankle - AH 9   3.0     Pop fossa AH 15.2  0.7  63.5 Pop fossa - Ankle 40 42 ?41 2.0                 SNC    Nerve / Sites Rec. Site Peak Lat Ref.  Amp Ref. Segments Distance    ms ms V V  cm  L Sural - Ankle (Calf)     Calf Ankle NR ?4.40 NR ?6 Calf - Ankle 14  R Sural - Ankle (Calf)     Calf Ankle NR ?4.40 NR ?6 Calf - Ankle 14  L Superficial peroneal - Ankle     Lat leg Ankle NR ?4.40 NR ?6 Lat leg - Ankle 14  R Superficial peroneal - Ankle     Lat leg Ankle NR ?4.40 NR ?6 Lat leg - Ankle 14  L Median - Orthodromic (Dig II, Mid palm)     Dig II Wrist 3.07 ?3.40 9 ?10 Dig II - Wrist 13  L Ulnar - Orthodromic, (Dig V, Mid palm)     Dig V Wrist 2.92 ?3.10 4 ?5 Dig V - Wrist 9                 F  Wave    Nerve F Lat Ref.   ms ms  L Tibial - AH 59.6 ?56.0  R Tibial - AH NR ?56.0  L Ulnar - ADM 27.0 ?32.0           EMG full       EMG Summary Table    Spontaneous MUAP Recruitment  Muscle IA Fib PSW Fasc Other Amp Dur. Poly Pattern  R. Tibialis anterior Normal None None None _______ Normal Normal Normal Normal  R. Gastrocnemius (Medial head) Normal None None None _______ Normal  Normal Normal Normal  R. Vastus lateralis Normal None None None _______ Normal Normal Normal Normal  L. Tibialis anterior Normal None None None _______ Normal Normal Normal Normal  L. Gastrocnemius (Medial head) Normal None None None _______ Normal Normal Normal Normal  L. Vastus lateralis Normal None None None _______ Normal Normal Normal Normal  L. Biceps brachii Normal None None None _______ Normal Normal Normal Normal  L. Deltoid Normal None None None _______ Normal Normal Normal Normal  L. Triceps brachii Normal None None None _______ Normal Normal Normal Normal  L. Cervical paraspinals Normal None None None _______ Normal Normal Normal Normal  L. Lumbar paraspinals (low) Normal None None None _______ Normal Normal Normal Normal  L. Lumbar paraspinals (mid) Normal None None None _______ Normal Normal Normal Normal  R. Lumbar paraspinals (low) Normal None None None _______ Normal Normal Normal Normal  R. Lumbar paraspinals (mid) Normal None None None _______ Normal Normal Normal Normal

## 2017-02-19 ENCOUNTER — Telehealth: Payer: Self-pay | Admitting: Neurology

## 2017-02-19 NOTE — Telephone Encounter (Signed)
I was called about an elevated CK-MB level of 14. The patient has had a known elevation of CK enzymes since 4/11, The troponin I level is still pending. The patient is quite physically active, riding a bike frequently, I will check the labs later today to see what the troponin I level is.

## 2017-02-20 LAB — ALDOLASE: ALDOLASE: 7.7 U/L (ref 3.3–10.3)

## 2017-02-20 LAB — CK TOTAL AND CKMB (NOT AT ARMC)
CK MB INDEX: 14.5 ng/mL — AB (ref 0.0–10.4)
CK TOTAL: 406 U/L — AB (ref 24–204)

## 2017-02-20 LAB — TROPONIN I: TROPONIN I: 0.03 ng/mL (ref 0.00–0.04)

## 2017-02-20 LAB — LACTATE DEHYDROGENASE: LDH: 253 IU/L — ABNORMAL HIGH (ref 121–224)

## 2017-02-22 ENCOUNTER — Telehealth: Payer: Self-pay | Admitting: Neurology

## 2017-02-22 NOTE — Telephone Encounter (Signed)
Pt wife said that she reached out to [facility]: Johna Sheriff MD [phone] and if the concern re: pt eye could be faxed over they will get him in. Please fax to [phone]. Please call pt wife on cell# if there are questions for her [phone]

## 2017-02-22 NOTE — Telephone Encounter (Signed)
Records faxed and confirmed to Dr. Payton Emerald office.  Returned call to patient's wife to let her know the request is complete.

## 2017-02-25 ENCOUNTER — Telehealth: Payer: Self-pay | Admitting: Neurology

## 2017-02-25 DIAGNOSIS — S5002XA Contusion of left elbow, initial encounter: Secondary | ICD-10-CM | POA: Diagnosis not present

## 2017-02-25 NOTE — Telephone Encounter (Signed)
I have called his wife, laboratory evaluation continue showed mild elevated CK,  There was also mild elevated CK-MB, but negative troponin, patient denies chest pain, likely he had a baseline hyperckemia, does not indicate underlying muscle disease.

## 2017-03-30 DIAGNOSIS — J310 Chronic rhinitis: Secondary | ICD-10-CM | POA: Insufficient documentation

## 2017-03-30 DIAGNOSIS — R42 Dizziness and giddiness: Secondary | ICD-10-CM | POA: Diagnosis not present

## 2017-03-30 DIAGNOSIS — J320 Chronic maxillary sinusitis: Secondary | ICD-10-CM | POA: Diagnosis not present

## 2017-04-18 ENCOUNTER — Encounter: Payer: Self-pay | Admitting: Neurology

## 2017-04-19 NOTE — Telephone Encounter (Signed)
Pt was first seen in April for cognitive impairment, dizziness and neuropathy. MMSE: 25/30. Pt's NCV/EMG in May showed mild polyneuropathy and chronic bilateral L4 radiculopathy. Pt was started on Namenda. Labs w/ mild elevated CK-MB, but negative troponin. He has a follow-up appt scheduled 05/11/17.

## 2017-05-10 ENCOUNTER — Telehealth: Payer: Self-pay | Admitting: Cardiology

## 2017-05-10 ENCOUNTER — Ambulatory Visit (INDEPENDENT_AMBULATORY_CARE_PROVIDER_SITE_OTHER): Payer: Medicare Other | Admitting: *Deleted

## 2017-05-10 DIAGNOSIS — I442 Atrioventricular block, complete: Secondary | ICD-10-CM | POA: Diagnosis not present

## 2017-05-10 NOTE — Telephone Encounter (Signed)
Confirmed remote transmission w/ pt wife.   

## 2017-05-10 NOTE — Progress Notes (Signed)
Remote pacemaker transmission.   

## 2017-05-11 ENCOUNTER — Telehealth: Payer: Self-pay | Admitting: Neurology

## 2017-05-11 ENCOUNTER — Encounter: Payer: Self-pay | Admitting: Cardiology

## 2017-05-11 ENCOUNTER — Encounter: Payer: Self-pay | Admitting: Neurology

## 2017-05-11 ENCOUNTER — Ambulatory Visit (INDEPENDENT_AMBULATORY_CARE_PROVIDER_SITE_OTHER): Payer: Medicare Other | Admitting: Neurology

## 2017-05-11 VITALS — BP 151/91 | HR 65 | Ht 71.0 in | Wt 178.5 lb

## 2017-05-11 DIAGNOSIS — R42 Dizziness and giddiness: Secondary | ICD-10-CM

## 2017-05-11 DIAGNOSIS — G250 Essential tremor: Secondary | ICD-10-CM

## 2017-05-11 DIAGNOSIS — G3184 Mild cognitive impairment, so stated: Secondary | ICD-10-CM | POA: Diagnosis not present

## 2017-05-11 MED ORDER — DONEPEZIL HCL 10 MG PO TABS: 10.0000 mg | ORAL_TABLET | Freq: Every day | ORAL | 11 refills | Status: DC

## 2017-05-11 MED ORDER — METANX 3-90.314-2-35 MG PO CAPS: 1.0000 | ORAL_CAPSULE | Freq: Two times a day (BID) | ORAL | 4 refills | Status: DC

## 2017-05-11 MED ORDER — MEMANTINE HCL 10 MG PO TABS: 10.0000 mg | ORAL_TABLET | Freq: Two times a day (BID) | ORAL | 4 refills | Status: DC

## 2017-05-11 NOTE — Progress Notes (Signed)
PATIENT: Jeffery Rogers DOB: 06/28/1932  Chief Complaint  Patient presents with  . Follow-up  . MCI  . Tremors    continuiing sx, asking about ST vs LT memory     HISTORICAL  Jeffery Rogers is 83 years old right-handed male, accompanied by [name] and [name] for evaluation of memory loss, tremor, dizziness, he is referred by his primary care physician Jeffery Rogers, initial evaluation was on January 14 2017.  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  There was no significant family history of memory loss  Family history of bilateral hands tremor, gradually getting worse, no gait abnormality, his daughter also suffered bilateral hands tremor,  Dizziness since January 2018, no dizziness, lightheadedness in the sitting down or lying down position, most noticeable when he get up quickly from seated position, overnight sleeping, he has transient lightheadedness, he denies significant low back pain, no gait abnormality, no lower extremity paresthesia notice, but on today's examination was noted to have mild length dependent sensory changes, suggestive of peripheral neuropathy  He also complains of new onset intermittent binocular double vision during his vestibular rehabilitation only  UPDATE May 11 2017: Laboratory evaluations showed mild elevated A1c 5.8, otherwise negative or normal RPR, B12, TSH, CBC, acetylcholine receptor antibody, ESR C-reactive protein, positive ANA, elevated CPK  546  Electrodiagnostic study today showed length dependent peripheral neuropathy, in addition, there is evidence of mild chronic neuropathic changes involving bilateral L4 nerve roots. He has intermittent low back pain, but denies low back pain now, denies significant gait abnormality.  The also personally reviewed CT head without contrast, generalized atrophy, mild supratentorium small vessel disease.  He continues to complain short term memroy loss, wife reported agitation occasionally.     REVIEW OF SYSTEMS: Full 14 system review of systems performed and notable only for as above ALLERGIES: No Known Allergies  HOME MEDICATIONS: Current Outpatient Prescriptions  Medication Sig Dispense Refill  . acetaminophen (TYLENOL) 325 MG tablet Take 325 mg by mouth every 6 (six) hours as needed for headache.    Marland Kitchen apixaban (ELIQUIS) 5 MG TABS tablet Take 1 tablet (5 mg total) by mouth 2 (two) times daily. 180 tablet 3  . atorvastatin (LIPITOR) 40 MG tablet Take 1 tablet (40 mg total) by mouth daily. 90 tablet 3  . diphenhydrAMINE (BENADRYL) 25 mg capsule Take 25 mg by mouth every 6 (six) hours as needed for allergies.    Marland Kitchen L-Methylfolate-Algae-B12-B6 (METANX) 3-90.314-2-35 MG CAPS Take 1 capsule by mouth 2 (two) times daily. 60 capsule 11  . Loratadine (CLARITIN) 10 MG CAPS Take 10 mg by mouth daily.    . memantine (NAMENDA) 10 MG tablet Take 1 tablet (10 mg total) by mouth 2 (two) times daily. 60 tablet 11  . oxymetazoline (AFRIN) 0.05 % nasal spray Place 1 spray into both nostrils 2 (two) times daily as needed for congestion.     No current facility-administered medications for this visit.     PAST MEDICAL HISTORY: Past Medical History:  Diagnosis Date  .  Aortic insufficiency    Echo (04/2013):  Mild LVH, EF 55-60%, Gr 1 DD, mild AS (mean 21 mmHg), mild AI, MAC, mild MR, mild LAE, mild RVE, mod RAE, PASP 39  . Aortic stenosis   . Arthritis    "right arm/shoulder" (02/10/2016)  . Cerumen  impaction   . Complete heart block ([place]) 02/10/2016  . Dizziness   . Goiter   . Hay fever   . Headache    "related to sinus pressure" (02/10/2016)  . Heart murmur   . Hx of echocardiogram    Echo (04/2014): Mild LVH, EF 55-60%, no RWMA, mild AS (mean gradient 24 mm Hg), mild AI, mild to mod MS (mean 5 mm Hg), mod to severe LAE, mild RAE, PASP 36 mm Hg  . Hypercholesteremia   . Memory loss   . Nasal septal deviation   . Skin cancer of scalp    "check and have them burned off prn q 6 months" (02/10/2016)  . Snoring   . Tremor     PAST SURGICAL HISTORY: Past Surgical History:  Procedure Laterality Date  . CATARACT EXTRACTION W/ INTRAOCULAR LENS  IMPLANT, BILATERAL Bilateral ~ 2000  . EP IMPLANTABLE DEVICE N/A 02/11/2016   Procedure: Pacemaker Implant;  Surgeon: Lafave Lance, MD;  Location: [place] [facility];  Service: Cardiovascular;  Laterality: N/A;  . TONSILLECTOMY  1930s  . WISDOM TOOTH EXTRACTION  1990s    FAMILY HISTORY: Family History  Problem Relation Age of Onset  . Stroke Unknown   . Irregular heart beat Unknown   . Stroke Mother   . Stroke Father     SOCIAL HISTORY:  Social History   Social History  . Marital status: Married    Spouse name: N/A  . Number of children: 2  . Years of education: Bachelors   Occupational History  . Retired Chief Financial Officer    Social History Main Topics  . Smoking status: Former Smoker    Packs/day: 1.00    Years: 16.00    Types: Cigarettes  . Smokeless tobacco: Never Used     Comment: Quit smoking cigarettes in 1971  . Alcohol use 1.2 oz/week    1 Cans of beer, 1 Shots of liquor per week  . Drug use: No  . Sexual activity: Yes   Other Topics Concern  . Not on file   Social History Narrative   Lives at home with his wife.   Right-handed.   1 cup coffee per day.  Occasional Coke.     PHYSICAL EXAM   Vitals:   05/11/17 0819  BP: (!) 151/91  Pulse: 65  Weight: 178 lb 8 oz (81 kg)  Height: _0  (1.803 m)    Not  recorded      Body mass index is 24.9 kg/m.  PHYSICAL EXAMNIATION:  Gen: NAD, conversant, well nourised, obese, well groomed                     Cardiovascular: Regular rate rhythm, no peripheral edema, warm, nontender. Eyes: Conjunctivae clear without exudates or hemorrhage Neck: Supple, no carotid bruits. Pulmonary: Clear to auscultation bilaterally   NEUROLOGICAL EXAM:  MMSE - Mini Mental State Exam 05/11/2017 01/12/2017  Orientation to time 4 3  Orientation to Place 5 5  Registration 3 3  Attention/ Calculation 5 5  Recall 0 0  Language- name 2 objects 2 2  Language- repeat 1 1  Language- follow 3 step command 3 3  Language- read &  follow direction 1 1  Write a sentence 1 1  Copy design 1 1  Total score 26 25  Animal naming 5, clock drawing is normal  MENTAL STATUS: Speech:    Speech is normal; fluent and spontaneous with normal comprehension.  Cognition:     Orientation to time, place and person     Normal recent and remote memory     Normal Attention span and concentration     Normal Language, naming, repeating,spontaneous speech     Fund of knowledge   CRANIAL NERVES: CN II: Visual fields are full to confrontation. Fundoscopic exam is normal with sharp discs and no vascular changes. Pupils are round equal and briskly reactive to light. CN III, IV, VI: extraocular movement are normal. No ptosis. Cover and uncover test showed right inferior/exophoria, left superior/exophoria. CN V: Facial sensation is intact to pinprick in all 3 divisions bilaterally. Corneal responses are intact.  CN VII: Face is symmetric with normal eye closure and smile. CN VIII: Hard of hearing CN IX, X: Palate elevates symmetrically. Phonation is normal. CN XI: Head turning and shoulder shrug are intact CN XII: Tongue is midline with normal movements and no atrophy.  MOTOR: There is no pronator drift of out-stretched arms. Muscle bulk and tone are normal. Muscle strength is  normal.  REFLEXES: Reflexes are 2+ and symmetric at the biceps, triceps, knees, absent at ankles. Plantar responses are flexor.  SENSORY: Length dependent decreased to light touch, pinprick, vibratory sensation to ankle level, preserved toe proprioception  COORDINATION: Rapid alternating movements and fine finger movements are intact. There is no dysmetria on finger-to-nose and heel-knee-shin.    GAIT/STANCE: Posture is normal. Gait is steady with normal steps, base, arm swing, and turning. Heel and toe walking are normal. Tandem gait is normal.  Romberg is absent.   DIAGNOSTIC DATA (LABS, IMAGING, TESTING) - I reviewed patient records, labs, notes, testing and imaging myself where available.   ASSESSMENT AND PLAN  Jeffery Rogers is a 82 y.o. male    Mild cognitive impairment  Consistent with central nervous system degenerative disorder, most likely Alzheimer's disease.  Laboratory evaluation showed no treatable etiology  Namenda 10 mg twice a day, Aricept 10 mg daily Essential tremor Orthostatic dizziness  Improved with hydration  EMG nerve conduction study showed mild axonal peripheral neuropathy, mild elevated A1c 5.8       Marcial Pacas, M.D. Ph.D.  [facility] [address] [place], [place] [address] Ph: [phone] Fax: [phone]  CC: Jeffery Pretty, MD

## 2017-05-11 NOTE — Telephone Encounter (Signed)
Called and spoke with wife. She stated she noticed the provider section was not filled out on form for Jeffery Rogers. I advised she would need to bring form back in to have Dr Krista Blue sign. She will bring form back next week to have Dr Krista Blue. She states it is not urgent for patient to start medication.

## 2017-05-11 NOTE — Telephone Encounter (Signed)
Pt wife calling re: the form she was given this morning for pt and she states on there it is requesting the DEA# and NPI # Pt wife is asking for a call back with this information to complete the form

## 2017-05-15 DIAGNOSIS — R42 Dizziness and giddiness: Secondary | ICD-10-CM | POA: Insufficient documentation

## 2017-05-17 ENCOUNTER — Ambulatory Visit: Payer: Medicare Other | Admitting: Neurology

## 2017-05-17 LAB — CUP PACEART REMOTE DEVICE CHECK
Battery Remaining Longevity: 174 mo
Brady Statistic RV Percent Paced: 99 %
Date Time Interrogation Session: 20180807155400
Implantable Lead Implant Date: 20170510
Implantable Lead Location: 753859
Implantable Lead Location: 753860
Implantable Lead Model: 7742
Implantable Lead Serial Number: 729262
Implantable Pulse Generator Implant Date: 20170510
Lead Channel Pacing Threshold Amplitude: 0.8 V
Lead Channel Setting Pacing Amplitude: 1.3 V
Lead Channel Setting Pacing Pulse Width: 0.4 ms
MDC IDC LEAD IMPLANT DT: 20170510
MDC IDC LEAD SERIAL: 766721
MDC IDC MSMT BATTERY REMAINING PERCENTAGE: 100 %
MDC IDC MSMT LEADCHNL RA IMPEDANCE VALUE: 837 Ohm
MDC IDC MSMT LEADCHNL RV IMPEDANCE VALUE: 736 Ohm
MDC IDC MSMT LEADCHNL RV PACING THRESHOLD PULSEWIDTH: 0.4 ms
MDC IDC SET LEADCHNL RV SENSING SENSITIVITY: 2.5 mV
MDC IDC STAT BRADY RA PERCENT PACED: 0 %
Pulse Gen Serial Number: 748273

## 2017-05-19 DIAGNOSIS — Z961 Presence of intraocular lens: Secondary | ICD-10-CM | POA: Diagnosis not present

## 2017-05-19 DIAGNOSIS — D3132 Benign neoplasm of left choroid: Secondary | ICD-10-CM | POA: Diagnosis not present

## 2017-05-19 DIAGNOSIS — H4913 Fourth [trochlear] nerve palsy, bilateral: Secondary | ICD-10-CM | POA: Diagnosis not present

## 2017-05-19 DIAGNOSIS — H35033 Hypertensive retinopathy, bilateral: Secondary | ICD-10-CM | POA: Diagnosis not present

## 2017-05-24 ENCOUNTER — Other Ambulatory Visit: Payer: Self-pay | Admitting: Internal Medicine

## 2017-05-24 MED ORDER — ATORVASTATIN CALCIUM 40 MG PO TABS: 40.0000 mg | ORAL_TABLET | Freq: Every day | ORAL | 2 refills | Status: DC

## 2017-05-27 ENCOUNTER — Telehealth: Payer: Self-pay | Admitting: Neurology

## 2017-05-27 NOTE — Telephone Encounter (Signed)
Patients wife called office to notify patient will be using a new pharmacy as [facility].  FYI

## 2017-05-30 NOTE — Telephone Encounter (Signed)
Pharmacy updated in chart

## 2017-05-31 ENCOUNTER — Telehealth: Payer: Self-pay | Admitting: Neurology

## 2017-05-31 MED ORDER — METANX 3-90.314-2-35 MG PO CAPS: 1.0000 | ORAL_CAPSULE | Freq: Two times a day (BID) | ORAL | 4 refills | Status: DC

## 2017-05-31 MED ORDER — MEMANTINE HCL 10 MG PO TABS
10.0000 mg | ORAL_TABLET | Freq: Two times a day (BID) | ORAL | 4 refills | Status: DC
Start: 2017-05-31 — End: 2018-05-15

## 2017-05-31 MED ORDER — DONEPEZIL HCL 10 MG PO TABS: 10.0000 mg | ORAL_TABLET | Freq: Every day | ORAL | 4 refills | Status: DC

## 2017-05-31 NOTE — Telephone Encounter (Signed)
Prescriptions sent to the requested pharmacy

## 2017-05-31 NOTE — Telephone Encounter (Signed)
[name] request refills for donepezil (ARICEPT) 10 MG tablet, memantine (NAMENDA) 10 MG tablet and L-Methylfolate-Algae-B12-B6 (METANX) 3-90.314-2-35 MG CAPS sent to [place].

## 2017-06-29 ENCOUNTER — Telehealth: Payer: Self-pay | Admitting: Neurology

## 2017-06-29 ENCOUNTER — Other Ambulatory Visit: Payer: Self-pay | Admitting: *Deleted

## 2017-06-29 MED ORDER — QUETIAPINE FUMARATE 25 MG PO TABS: 25.0000 mg | ORAL_TABLET | Freq: Every day | ORAL | 5 refills | Status: DC

## 2017-06-29 MED ORDER — ALPRAZOLAM 0.5 MG PO TABS: ORAL_TABLET | ORAL | 5 refills | Status: DC

## 2017-06-29 NOTE — Telephone Encounter (Signed)
Dr. Krista Blue spoke to patient's wife, Jeffery Rogers.  After discussion, she has provided prescription for Xanax 0.5 mg, 1-2 daily prn and Seroquel 25mg , one tablet qhs.  Prescriptions have been sent to [facility] per patient's request.

## 2017-06-29 NOTE — Addendum Note (Signed)
Addended by: Desmond Lope on: 06/29/2017 05:43 PM   Modules accepted: Orders

## 2017-06-29 NOTE — Telephone Encounter (Signed)
Patient's wife calling stating the patient has had a change in his behavior for a couple days. He is very angry and aggressive. Is there something he can take to calm him down? Please call and advise. The patient uses [facility].

## 2017-06-30 ENCOUNTER — Other Ambulatory Visit: Payer: Self-pay | Admitting: *Deleted

## 2017-06-30 MED ORDER — QUETIAPINE FUMARATE 25 MG PO TABS
25.0000 mg | ORAL_TABLET | Freq: Every day | ORAL | 5 refills | Status: DC
Start: 2017-06-30 — End: 2017-10-10

## 2017-06-30 NOTE — Telephone Encounter (Signed)
Patients wife called office stating she is in process of picking up RX's from [facility], but would like these 2 medications to be sent to [place].  FYI

## 2017-06-30 NOTE — Telephone Encounter (Signed)
Spoke to patient's wife - she would like Seroquel sent to [facility].  She would like to keep Xanax at [facility] until she sees how often he uses it.

## 2017-07-11 ENCOUNTER — Telehealth: Payer: Self-pay | Admitting: Neurology

## 2017-07-11 NOTE — Telephone Encounter (Signed)
Information from chart: Mild cognitive impairment - Consistent with central nervous system degenerative disorder, most likely Alzheimer's disease.  His wife feels he is starting to be unsafe when driving.  She plans to speak with him concerning his diagnosis.  She feels he will be open and understanding of her suggestions not to drive.  She will call us back with any concerns or difficulty with the situation.

## 2017-07-11 NOTE — Telephone Encounter (Signed)
Patient's wife is calling to discuss patient's diagnosis and also the patient's driving.

## 2017-07-26 DIAGNOSIS — Z23 Encounter for immunization: Secondary | ICD-10-CM | POA: Diagnosis not present

## 2017-08-09 ENCOUNTER — Ambulatory Visit (INDEPENDENT_AMBULATORY_CARE_PROVIDER_SITE_OTHER): Payer: Medicare Other | Admitting: *Deleted

## 2017-08-09 DIAGNOSIS — I442 Atrioventricular block, complete: Secondary | ICD-10-CM | POA: Diagnosis not present

## 2017-08-09 NOTE — Progress Notes (Signed)
Remote pacemaker transmission.   

## 2017-08-10 DIAGNOSIS — H6123 Impacted cerumen, bilateral: Secondary | ICD-10-CM | POA: Diagnosis not present

## 2017-08-11 ENCOUNTER — Encounter: Payer: Self-pay | Admitting: Cardiology

## 2017-08-19 LAB — CUP PACEART REMOTE DEVICE CHECK
Battery Remaining Percentage: 100 %
Implantable Lead Implant Date: 20170510
Implantable Lead Location: 753859
Implantable Lead Model: 7742
Implantable Lead Serial Number: 729262
Implantable Pulse Generator Implant Date: 20170510
Lead Channel Impedance Value: 731 Ohm
Lead Channel Impedance Value: 818 Ohm
Lead Channel Pacing Threshold Pulse Width: 0.4 ms
Lead Channel Setting Pacing Amplitude: 1.2 V
Lead Channel Setting Sensing Sensitivity: 2.5 mV
MDC IDC LEAD IMPLANT DT: 20170510
MDC IDC LEAD LOCATION: 753860
MDC IDC LEAD SERIAL: 766721
MDC IDC MSMT BATTERY REMAINING LONGEVITY: 180 mo
MDC IDC MSMT LEADCHNL RV PACING THRESHOLD AMPLITUDE: 0.8 V
MDC IDC SESS DTM: 20181106053400
MDC IDC SET LEADCHNL RV PACING PULSEWIDTH: 0.4 ms
MDC IDC STAT BRADY RA PERCENT PACED: 0 %
MDC IDC STAT BRADY RV PERCENT PACED: 99 %
Pulse Gen Serial Number: 748273

## 2017-10-07 ENCOUNTER — Telehealth: Payer: Self-pay | Admitting: Neurology

## 2017-10-07 NOTE — Telephone Encounter (Signed)
[name] called said he is going thru periods of dispondency, angry, little interest beyond himself, pity for himself. She is wanting to know if there is a medication that will help with the dispondency. She is aware the clinic closes at noon today. Pharmacy: [facility] (insurance has not changed)

## 2017-10-07 NOTE — Telephone Encounter (Signed)
I was able to talk with his wife, patient traveled to [place] over the holiday season, there was 2 episode of emotional outburst, he felt useless, upset,  But overall, he is doing well, sleeps well with Seroquel, taking Xanax as needed,  We decided to keep him on current medications, Sharyn Lull, please check on him again on October 10, 2017, go over the medication list with his wife,  If he still has a lot of emotional outburst, the options are higher dose of Seroquel, 25 mg 1 at night, half tablet in the morning.  If needed, may move up his appointment

## 2017-10-10 MED ORDER — QUETIAPINE FUMARATE 25 MG PO TABS: ORAL_TABLET | ORAL | 0 refills | Status: DC

## 2017-10-10 NOTE — Addendum Note (Signed)
Addended by: Noberto Retort C on: 10/10/2017 04:59 PM   Modules accepted: Orders

## 2017-10-10 NOTE — Telephone Encounter (Signed)
Spoke to patient's wife - he is still having the same symptoms reported below.  His medication list was verified and is correct in Epic.  Per Dr. Rhea Belton instruction, she is agreeable to increase his Seroquel 25mg  to 0.5 tablet in am and 1 tablet at bedtime.  She would like a one month supply sent to [facility] until his maintenance dose is determined.  Upon his next refill, she may want this prescription sent to [place].  She will call and let us know.

## 2017-11-08 ENCOUNTER — Ambulatory Visit (INDEPENDENT_AMBULATORY_CARE_PROVIDER_SITE_OTHER): Payer: Medicare Other | Admitting: *Deleted

## 2017-11-08 DIAGNOSIS — I442 Atrioventricular block, complete: Secondary | ICD-10-CM

## 2017-11-08 NOTE — Progress Notes (Signed)
Remote pacemaker transmission.   

## 2017-11-09 ENCOUNTER — Encounter: Payer: Self-pay | Admitting: Cardiology

## 2017-11-14 ENCOUNTER — Encounter: Payer: Self-pay | Admitting: Neurology

## 2017-11-14 ENCOUNTER — Ambulatory Visit (INDEPENDENT_AMBULATORY_CARE_PROVIDER_SITE_OTHER): Payer: Medicare Other | Admitting: Neurology

## 2017-11-14 VITALS — BP 169/88 | HR 60 | Ht 71.0 in | Wt 179.5 lb

## 2017-11-14 DIAGNOSIS — G3184 Mild cognitive impairment, so stated: Secondary | ICD-10-CM | POA: Diagnosis not present

## 2017-11-14 MED ORDER — QUETIAPINE FUMARATE 25 MG PO TABS: ORAL_TABLET | ORAL | 4 refills | Status: DC

## 2017-11-14 NOTE — Progress Notes (Signed)
PATIENT: Jeffery Rogers DOB: 06-07-1932  Chief Complaint  Patient presents with  . Mild Cognitive Impairment    MMSE 25/30 - 9 animals.  He is here with his wife, Jeffery Rogers and son, Jeffery, Rogers. His memory has continued to be on a gradual decline.  Symptoms are more pronounced in unfamiliar environments.    . Tremors    His bilateral hand tremors are worse.  He is having more difficulty writing and drinking a cup of coffee.    . Gait Anormality    His wife says he has continued with a shuffling gait, some days better than others.  He is still able to ride his bike weekly.     HISTORICAL  Jeffery Rogers is 84 years old right-handed male, accompanied by [name] and [name] for evaluation of memory loss, tremor, dizziness, he is referred by his primary care physician Jeffery Rogers, initial evaluation was on January 14 2017.  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  There was no significant family history of memory loss  Family history of bilateral hands tremor, gradually getting worse, no gait abnormality, his daughter also suffered bilateral hands tremor,  Dizziness since January 2018, no dizziness, lightheadedness in the sitting down or lying down position, most noticeable when he get up quickly from seated position, overnight sleeping, he has transient lightheadedness, he denies significant low back pain, no gait abnormality, no lower extremity paresthesia notice, but on today's examination was noted to  have mild length dependent sensory changes, suggestive of peripheral neuropathy  He also complains of new onset intermittent binocular double vision during his vestibular rehabilitation only  UPDATE May 11 2017: Laboratory evaluations showed mild elevated A1c 5.8, otherwise negative or normal RPR, B12, TSH, CBC, acetylcholine receptor antibody, ESR C-reactive protein, positive ANA, elevated CPK 546  Electrodiagnostic study today showed length dependent peripheral neuropathy, in addition, there is evidence of mild chronic neuropathic changes involving bilateral L4 nerve roots. He has intermittent low back pain, but denies low back pain now, denies significant gait abnormality.  The also personally reviewed CT head without contrast, generalized atrophy, mild supratentorium small vessel disease.  He continues to complain short term memroy loss, wife reported agitation occasionally.   UPDATE Nov 14 2017: He is accompanied by his family at today's clinic visit, he is overall doing very well, status 25 out of 30, continue to exercise regularly, mild increased bilateral hands tremor.  REVIEW OF SYSTEMS: Full 14 system review of systems performed and notable only for as above ALLERGIES: No Known Allergies  HOME MEDICATIONS: Current Outpatient Medications  Medication Sig Dispense Refill  . acetaminophen (TYLENOL) 325 MG tablet Take 325 mg by mouth every 6 (six) hours as needed for headache.    . ALPRAZolam (XANAX) 0.5 MG tablet Take 1-2 tablets daily as needed for agitation. 60 tablet 5  . apixaban (ELIQUIS) 5 MG TABS tablet Take 1 tablet (5 mg total) by mouth 2 (two) times daily. 180 tablet 3  . atorvastatin (LIPITOR) 40 MG tablet Take  1 tablet (40 mg total) by mouth daily. 90 tablet 2  . diphenhydrAMINE (BENADRYL) 25 mg capsule Take 25 mg by mouth every 6 (six) hours as needed for allergies.    Marland Kitchen donepezil (ARICEPT) 10 MG tablet Take 1 tablet (10 mg total) by mouth at bedtime. 90 tablet 4  .  L-Methylfolate-Algae-B12-B6 (METANX) 3-90.314-2-35 MG CAPS Take 1 capsule by mouth 2 (two) times daily. 180 capsule 4  . Loratadine (CLARITIN) 10 MG CAPS Take 10 mg by mouth daily.    . memantine (NAMENDA) 10 MG tablet Take 1 tablet (10 mg total) by mouth 2 (two) times daily. 180 tablet 4  . oxymetazoline (AFRIN) 0.05 % nasal spray Place 1 spray into both nostrils 2 (two) times daily as needed for congestion.    . QUEtiapine (SEROQUEL) 25 MG tablet Take 0.5 tablet in morning and 1 tablet at bedtime. 45 tablet 0   No current facility-administered medications for this visit.     PAST MEDICAL HISTORY: Past Medical History:  Diagnosis Date  . Aortic insufficiency    Echo (04/2013):  Mild LVH, EF 55-60%, Gr 1 DD, mild AS (mean 21 mmHg), mild AI, MAC, mild MR, mild LAE, mild RVE, mod RAE, PASP 39  . Aortic stenosis   . Arthritis    "right arm/shoulder" (02/10/2016)  . Cerumen impaction   . Complete heart block ([place]) 02/10/2016  . Dizziness   . Goiter   . Hay fever   . Headache    "related to sinus pressure" (02/10/2016)  . Heart murmur   . Hx of echocardiogram    Echo (04/2014): Mild LVH, EF 55-60%, no RWMA, mild AS (mean gradient 24 mm Hg), mild AI, mild to mod MS (mean 5 mm Hg), mod to severe LAE, mild RAE, PASP 36 mm Hg  . Hypercholesteremia   . Memory loss   . Nasal septal deviation   . Skin cancer of scalp    "check and have them burned off prn q 6 months" (02/10/2016)  . Snoring   . Tremor     PAST SURGICAL HISTORY: Past Surgical History:  Procedure Laterality Date  . CATARACT EXTRACTION W/ INTRAOCULAR LENS  IMPLANT, BILATERAL Bilateral ~ 2000  . EP IMPLANTABLE DEVICE N/A 02/11/2016   Procedure: Pacemaker Implant;  Surgeon: Hoffmeister Lance, MD;  Location: [place] [facility];  Service: Cardiovascular;  Laterality: N/A;  . TONSILLECTOMY  1930s  . WISDOM TOOTH EXTRACTION  1990s    FAMILY HISTORY: Family History  Problem Relation Age of Onset  . Stroke Unknown   . Irregular heart  beat Unknown   . Stroke Mother   . Stroke Father     SOCIAL HISTORY:  Social History   Socioeconomic History  . Marital status: Married    Spouse name: Not on file  . Number of children: 2  . Years of education: Bachelors  . Highest education level: Not on file  Social Needs  . Financial resource strain: Not on file  . Food insecurity - worry: Not on file  . Food insecurity - inability: Not on file  . Transportation needs - medical: Not on file  . Transportation needs - non-medical: Not on file  Occupational History  . Occupation: Retired Chief Financial Officer  Tobacco Use  . Smoking status: Former Smoker    Packs/day: 1.00    Years: 16.00    Pack years: 16.00    Types: Cigarettes  . Smokeless tobacco: Never Used  . Tobacco comment: Quit smoking cigarettes in  1971  Substance and Sexual Activity  . Alcohol use: Yes    Alcohol/week: 1.2 oz    Types: 1 Cans of beer, 1 Shots of liquor per week  . Drug use: No  . Sexual activity: Yes  Other Topics Concern  . Not on file  Social History Narrative   Lives at home with his wife.   Right-handed.   1 cup coffee per day.  Occasional Coke.     PHYSICAL EXAM   Vitals:   11/14/17 1503  BP: (!) 169/88  Pulse: 60  Weight: 179 lb 8 oz (81.4 kg)  Height: _0  (1.803 m)    Not recorded      Body mass index is 25.04 kg/m.  PHYSICAL EXAMNIATION:  Gen: NAD, conversant, well nourised, obese, well groomed                     Cardiovascular: Regular rate rhythm, no peripheral edema, warm, nontender. Eyes: Conjunctivae clear without exudates or hemorrhage Neck: Supple, no carotid bruits. Pulmonary: Clear to auscultation bilaterally   NEUROLOGICAL EXAM:  MMSE - Mini Mental State Exam 11/14/2017 05/11/2017 01/12/2017  Orientation to time _1 Orientation to Place _2 Registration _3 Attention/ Calculation _4 Recall 0 0 0  Language- name 2 objects _5 Language- repeat _6 Language- follow 3 step command _7 Language- read & follow direction _8 Write a sentence _9 Copy design _10 Total score _11 Animal naming 5, clock drawing is normal  MENTAL STATUS: Speech:    Speech is normal; fluent and spontaneous with normal comprehension.  Cognition:     Orientation to time, place and person     Normal recent and remote memory     Normal Attention span and concentration     Normal Language, naming, repeating,spontaneous speech     Fund of knowledge   CRANIAL NERVES: CN II: Visual fields are full to confrontation. Fundoscopic exam is normal with sharp discs and no vascular changes. Pupils are round equal and briskly reactive to light. CN III, IV, VI: extraocular movement are normal. No ptosis. Cover and uncover test showed right inferior/exophoria, left superior/exophoria. CN V: Facial sensation is intact to pinprick in all 3 divisions bilaterally. Corneal responses are intact.  CN VII: Face is symmetric with normal eye closure and smile. CN VIII: Hard of hearing CN IX, X: Palate elevates symmetrically. Phonation is normal. CN XI: Head turning and shoulder shrug are intact CN XII: Tongue is midline with normal movements and no atrophy.  MOTOR: There is no pronator drift of out-stretched arms. Muscle bulk and tone are normal. Muscle strength is normal.  REFLEXES: Reflexes are 2+ and symmetric at the biceps, triceps, knees, absent at ankles. Plantar responses are flexor.  SENSORY: Length dependent decreased to light touch, pinprick, vibratory sensation to ankle level, preserved toe proprioception  COORDINATION: Rapid alternating movements and fine finger movements are intact. There is no dysmetria on finger-to-nose and heel-knee-shin.    GAIT/STANCE: Posture is normal. Gait is steady with normal steps, base, arm swing, and turning. Heel and toe walking are normal. Tandem gait is normal.  Romberg is absent.   DIAGNOSTIC DATA (LABS, IMAGING, TESTING) - I reviewed patient  records, labs, notes, testing and imaging myself where available.   ASSESSMENT AND PLAN  Avrohom T  Hack is a 84 y.o. male    Mild cognitive impairment  Consistent with central nervous system degenerative disorder, most likely Alzheimer's disease.  Laboratory evaluation showed no treatable etiology  Namenda 10 mg twice a day, Aricept 10 mg daily Essential tremor Orthostatic dizziness  Improved with hydration  EMG nerve conduction study showed mild axonal peripheral neuropathy, mild elevated A1c 5.8 Sinus symptoms:  Evidence of chronic Benadryl use, sinus symptoms, CT showed retention of right maxillary sinus, I have advised him continue tapering off Benadryl use, to avoid the long-term side effects, and central nervous system side effects.  If needed, may be reevaluated by ENT physician        Marcial Pacas, M.D. Ph.D.  [facility] [address], [place], [place] [address] Ph: [phone] Fax: [phone]  CC: Jeffery Pretty, MD

## 2017-11-17 ENCOUNTER — Encounter: Payer: Self-pay | Admitting: *Deleted

## 2017-11-17 ENCOUNTER — Telehealth: Payer: Self-pay | Admitting: Neurology

## 2017-11-17 MED ORDER — QUETIAPINE FUMARATE 25 MG PO TABS: 25.0000 mg | ORAL_TABLET | Freq: Every day | ORAL | 3 refills | Status: DC

## 2017-11-17 NOTE — Telephone Encounter (Signed)
Pt wife(on DPR) calling to inform that pt does not need QUEtiapine (SEROQUEL) 25 MG tablet in the morning.  Pt only needs to take this at night according to pt wife.  Please call

## 2017-11-17 NOTE — Telephone Encounter (Addendum)
Spoke to patient's wife - states her husband is only taking quetiapine 25mg , one tablet at bedtime.  He does not need the morning half dose.  They use the [facility] for all his prescriptions and this company pre-packages his daily medications for the month.  She is requesting a new prescription be sent to them for quetiapine 25mg , one tablet at bedtime so she does not have extra pills leftover.  New rx sent.

## 2017-11-17 NOTE — Addendum Note (Signed)
Addended by: Noberto Retort C on: 11/17/2017 02:19 PM   Modules accepted: Orders

## 2017-11-22 NOTE — Telephone Encounter (Signed)
Carrie from [facility] has called re: pt's:QUEtiapine (SEROQUEL) 25 MG tablet she is asking for a call back at [phone] option 3

## 2017-11-22 NOTE — Telephone Encounter (Signed)
[facility] wanted to confirm his Seroquel dosage of 25mg  at bedtime.

## 2017-11-25 DIAGNOSIS — J320 Chronic maxillary sinusitis: Secondary | ICD-10-CM | POA: Diagnosis not present

## 2017-11-25 DIAGNOSIS — Z974 Presence of external hearing-aid: Secondary | ICD-10-CM | POA: Diagnosis not present

## 2017-11-25 DIAGNOSIS — H6123 Impacted cerumen, bilateral: Secondary | ICD-10-CM | POA: Insufficient documentation

## 2017-11-25 DIAGNOSIS — Z87891 Personal history of nicotine dependence: Secondary | ICD-10-CM | POA: Diagnosis not present

## 2017-11-25 DIAGNOSIS — H9113 Presbycusis, bilateral: Secondary | ICD-10-CM | POA: Diagnosis not present

## 2017-11-25 DIAGNOSIS — Z7289 Other problems related to lifestyle: Secondary | ICD-10-CM | POA: Diagnosis not present

## 2017-11-25 DIAGNOSIS — J300 Vasomotor rhinitis: Secondary | ICD-10-CM | POA: Insufficient documentation

## 2017-12-05 LAB — CUP PACEART REMOTE DEVICE CHECK
Battery Remaining Percentage: 100 %
Brady Statistic RA Percent Paced: 0 %
Implantable Lead Implant Date: 20170510
Implantable Lead Location: 753859
Implantable Lead Model: 7742
Implantable Lead Serial Number: 766721
Implantable Pulse Generator Implant Date: 20170510
Lead Channel Impedance Value: 723 Ohm
Lead Channel Impedance Value: 785 Ohm
Lead Channel Pacing Threshold Pulse Width: 0.4 ms
Lead Channel Setting Pacing Amplitude: 1.3 V
Lead Channel Setting Sensing Sensitivity: 2.5 mV
MDC IDC LEAD IMPLANT DT: 20170510
MDC IDC LEAD LOCATION: 753860
MDC IDC LEAD SERIAL: 729262
MDC IDC MSMT BATTERY REMAINING LONGEVITY: 174 mo
MDC IDC MSMT LEADCHNL RV PACING THRESHOLD AMPLITUDE: 0.7 V
MDC IDC SESS DTM: 20190205052000
MDC IDC SET LEADCHNL RV PACING PULSEWIDTH: 0.4 ms
MDC IDC STAT BRADY RV PERCENT PACED: 100 %
Pulse Gen Serial Number: 748273

## 2017-12-19 ENCOUNTER — Ambulatory Visit (INDEPENDENT_AMBULATORY_CARE_PROVIDER_SITE_OTHER): Payer: Medicare Other | Admitting: Sports Medicine

## 2017-12-19 ENCOUNTER — Encounter: Payer: Self-pay | Admitting: Sports Medicine

## 2017-12-19 VITALS — BP 130/90 | Ht 71.0 in | Wt 178.0 lb

## 2017-12-19 DIAGNOSIS — M67912 Unspecified disorder of synovium and tendon, left shoulder: Secondary | ICD-10-CM | POA: Diagnosis not present

## 2017-12-19 MED ORDER — METHYLPREDNISOLONE ACETATE 40 MG/ML IJ SUSP
40.0000 mg | Freq: Once | INTRAMUSCULAR | Status: AC
  Administered 2017-12-19: 40 mg via INTRA_ARTICULAR

## 2017-12-19 NOTE — Progress Notes (Signed)
complication  Subjective   Patient ID: Jeffery Rogers    DOB: 05-Sep-1932, 84 y.o. male   MRN: [id number]  CC: "Left shoulder pain"  HPI: Jeffery Rogers is a 84 y.o. male who presents to clinic today for the following:  Left shoulder pain: Progressive left shoulder pain for the last 4 months.  Patient denies any history of trauma or falls.  He has no prior history of similar pain.  He states his right shoulder is unaffected.  Pain is mainly on lateral surface of upper arm and worse with movement especially lifting objects and while sleeping on his left side.  He has not tried any medications to alleviate his symptoms.  Denies any prior surgeries to his left shoulder.  He denies fevers or chills, loss of motor function, sensory loss.  ROS: see HPI for pertinent.  [place]: Essential tremor, heart block, HTN.  Surgical history pacemaker.  Family history stroke.  Smoking status reviewed. Medications reviewed.  Objective   BP 130/90   Ht 5\' 11"  (1.803 m)   Wt 178 lb (80.7 kg)   BMI 24.83 kg/m  Vitals and nursing note reviewed.  General: well nourished, well developed, NAD with non-toxic appearance HEENT: normocephalic, atraumatic, moist mucous membranes Neck: supple, non-tender without lymphadenopathy Cardiovascular: regular rate and rhythm without murmurs, rubs, or gallops Lungs: clear to auscultation bilaterally with normal work of breathing Abdomen: soft, non-tender, non-distended, normoactive bowel sounds Skin: warm, dry, no rashes or lesions, cap refill < 2 seconds MSK: No deformities, mild atrophy of left shoulder particularly with supra and infraspinatus, active and passive range of motion intact, mild weakness with external weakness, neurovascular intact, positive empty can test  Procedure Note: Right Subacromial Injection Patient was given informed consent, signed copy in the chart. Appropriate time out was taken. Rogers prepped and draped in usual sterile fashion. 1 cc of  methylprednisolone 40 mg/ml plus  2 cc of 1% Xylocaine without epinephrine was injected into the subacromial space using a posterior approach. The patient tolerated the procedure well. There were no complications. Post procedure instructions were given.  Assessment & Plan   Tendinopathy of left rotator cuff Chronic.  Likely rotator cuff tendinopathy given weakness to infraspinatus and pain with abduction.  There is some notable atrophy on exam.  No prior history of steroid injections. - Subacromial injection of left shoulder with methylprednisolone - Given prescription for PT and at home exercises - RTC on an as-needed basis  No orders of the defined types were placed in this encounter.  Meds ordered this encounter  Medications  . methylPREDNISolone acetate (DEPO-MEDROL) injection 40 mg    Harriet Butte, DO [place], PGY-2 12/19/2017, 12:17 PM   Patient seen and evaluated with the resident. I agree with the above plan of care.Subacromial cortisone injection was administered without complication. Patient will follow-up for ongoing or recalcitrant issues.

## 2017-12-19 NOTE — Assessment & Plan Note (Signed)
Chronic.  Likely rotator cuff tendinopathy given weakness to infraspinatus and pain with abduction.  There is some notable atrophy on exam.  No prior history of steroid injections. - Subacromial injection of left shoulder with methylprednisolone - Given prescription for PT and at home exercises - RTC on an as-needed basis

## 2018-01-02 ENCOUNTER — Telehealth: Payer: Self-pay | Admitting: *Deleted

## 2018-01-02 NOTE — Telephone Encounter (Signed)
PA for Seroquel approved by [facility] plan [phone]).  Pt [id number].

## 2018-02-07 ENCOUNTER — Encounter: Payer: Medicare Other | Admitting: *Deleted

## 2018-02-07 ENCOUNTER — Telehealth: Payer: Self-pay | Admitting: Cardiology

## 2018-02-07 NOTE — Telephone Encounter (Signed)
LMOVM reminding pt to send remote transmission.   

## 2018-02-08 DIAGNOSIS — H6123 Impacted cerumen, bilateral: Secondary | ICD-10-CM | POA: Diagnosis not present

## 2018-02-09 ENCOUNTER — Encounter: Payer: Self-pay | Admitting: Cardiology

## 2018-02-16 DIAGNOSIS — Z125 Encounter for screening for malignant neoplasm of prostate: Secondary | ICD-10-CM | POA: Diagnosis not present

## 2018-02-16 DIAGNOSIS — E7800 Pure hypercholesterolemia, unspecified: Secondary | ICD-10-CM | POA: Diagnosis not present

## 2018-02-16 DIAGNOSIS — J342 Deviated nasal septum: Secondary | ICD-10-CM | POA: Diagnosis not present

## 2018-02-21 DIAGNOSIS — E049 Nontoxic goiter, unspecified: Secondary | ICD-10-CM | POA: Diagnosis not present

## 2018-02-21 DIAGNOSIS — Z0000 Encounter for general adult medical examination without abnormal findings: Secondary | ICD-10-CM | POA: Diagnosis not present

## 2018-02-21 DIAGNOSIS — R454 Irritability and anger: Secondary | ICD-10-CM | POA: Diagnosis not present

## 2018-02-21 DIAGNOSIS — I351 Nonrheumatic aortic (valve) insufficiency: Secondary | ICD-10-CM | POA: Diagnosis not present

## 2018-02-21 DIAGNOSIS — I440 Atrioventricular block, first degree: Secondary | ICD-10-CM | POA: Diagnosis not present

## 2018-02-21 DIAGNOSIS — I480 Paroxysmal atrial fibrillation: Secondary | ICD-10-CM | POA: Diagnosis not present

## 2018-02-21 DIAGNOSIS — E7800 Pure hypercholesterolemia, unspecified: Secondary | ICD-10-CM | POA: Diagnosis not present

## 2018-02-21 DIAGNOSIS — Z7901 Long term (current) use of anticoagulants: Secondary | ICD-10-CM | POA: Diagnosis not present

## 2018-02-21 DIAGNOSIS — G629 Polyneuropathy, unspecified: Secondary | ICD-10-CM | POA: Diagnosis not present

## 2018-02-21 DIAGNOSIS — Z7982 Long term (current) use of aspirin: Secondary | ICD-10-CM | POA: Diagnosis not present

## 2018-02-21 DIAGNOSIS — I442 Atrioventricular block, complete: Secondary | ICD-10-CM | POA: Diagnosis not present

## 2018-02-21 DIAGNOSIS — R251 Tremor, unspecified: Secondary | ICD-10-CM | POA: Diagnosis not present

## 2018-03-15 ENCOUNTER — Ambulatory Visit (INDEPENDENT_AMBULATORY_CARE_PROVIDER_SITE_OTHER): Payer: Medicare Other | Admitting: Internal Medicine

## 2018-03-15 ENCOUNTER — Encounter: Payer: Self-pay | Admitting: Internal Medicine

## 2018-03-15 VITALS — BP 158/80 | HR 64 | Ht 71.0 in | Wt 180.0 lb

## 2018-03-15 DIAGNOSIS — I442 Atrioventricular block, complete: Secondary | ICD-10-CM | POA: Diagnosis not present

## 2018-03-15 DIAGNOSIS — I480 Paroxysmal atrial fibrillation: Secondary | ICD-10-CM | POA: Diagnosis not present

## 2018-03-15 DIAGNOSIS — I350 Nonrheumatic aortic (valve) stenosis: Secondary | ICD-10-CM | POA: Diagnosis not present

## 2018-03-15 DIAGNOSIS — Z950 Presence of cardiac pacemaker: Secondary | ICD-10-CM

## 2018-03-15 NOTE — Progress Notes (Addendum)
HPI Mr. Schulenburg returns today for followup of his atrial fib and CHB, s/p PPM, and aortic stenosis. He is a very active and vigorous elderly man who developed symptomatic heart block and underwent PPM insertion a couple of years ago. No syncope, sob or chest pain. He exercises regularly. No limit. He feels well.  No Known Allergies   Current Outpatient Medications  Medication Sig Dispense Refill  . acetaminophen (TYLENOL) 325 MG tablet Take 325 mg by mouth every 6 (six) hours as needed for headache.    . ALPRAZolam (XANAX) 0.5 MG tablet Take 1-2 tablets daily as needed for agitation. 60 tablet 5  . apixaban (ELIQUIS) 5 MG TABS tablet Take 1 tablet (5 mg total) by mouth 2 (two) times daily. 180 tablet 3  . atorvastatin (LIPITOR) 40 MG tablet Take 1 tablet (40 mg total) by mouth daily. 90 tablet 2  . Cholecalciferol (VITAMIN D-3) 1000 units CAPS Take 1,000 Units by mouth daily. Take once at 7 am, once again at 7 pm    . donepezil (ARICEPT) 10 MG tablet Take 1 tablet (10 mg total) by mouth at bedtime. 90 tablet 4  . ipratropium (ATROVENT) 0.06 % nasal spray 1-2 puffs ea side up to 4 times daily for chronic runny nose.    Marland Kitchen L-Methylfolate-Algae-B12-B6 (METANX) 3-90.314-2-35 MG CAPS Take 1 capsule by mouth 2 (two) times daily. 180 capsule 4  . Loratadine (CLARITIN) 10 MG CAPS Take 10 mg by mouth daily.    . memantine (NAMENDA) 10 MG tablet Take 1 tablet (10 mg total) by mouth 2 (two) times daily. 180 tablet 4  . QUEtiapine (SEROQUEL) 25 MG tablet Take 1 tablet (25 mg total) by mouth at bedtime. 90 tablet 3  . vitamin C (ASCORBIC ACID) 500 MG tablet Take 500 mg by mouth daily.     No current facility-administered medications for this visit.      Past Medical History:  Diagnosis Date  . Aortic insufficiency    Echo (04/2013):  Mild LVH, EF 55-60%, Gr 1 DD, mild AS (mean 21 mmHg), mild AI, MAC, mild MR, mild LAE, mild RVE, mod RAE, PASP 39  . Aortic stenosis   . Arthritis    "right  arm/shoulder" (02/10/2016)  . Cerumen impaction   . Complete heart block ([place]) 02/10/2016  . Dizziness   . Goiter   . Hay fever   . Headache    "related to sinus pressure" (02/10/2016)  . Heart murmur   . Hx of echocardiogram    Echo (04/2014): Mild LVH, EF 55-60%, no RWMA, mild AS (mean gradient 24 mm Hg), mild AI, mild to mod MS (mean 5 mm Hg), mod to severe LAE, mild RAE, PASP 36 mm Hg  . Hypercholesteremia   . Memory loss   . Nasal septal deviation   . Skin cancer of scalp    "check and have them burned off prn q 6 months" (02/10/2016)  . Snoring   . Tremor     ROS:   All systems reviewed and negative except as noted in the HPI.   Past Surgical History:  Procedure Laterality Date  . CATARACT EXTRACTION W/ INTRAOCULAR LENS  IMPLANT, BILATERAL Bilateral ~ 2000  . EP IMPLANTABLE DEVICE N/A 02/11/2016   Procedure: Pacemaker Implant;  Surgeon: More Lance, MD;  Location: [place] [facility];  Service: Cardiovascular;  Laterality: N/A;  . TONSILLECTOMY  1930s  . [place]  History  Problem Relation Age of Onset  . Stroke Unknown   . Irregular heart beat Unknown   . Stroke Mother   . Stroke Father      Social History   Socioeconomic History  . Marital status: Married    Spouse name: Not on file  . Number of children: 2  . Years of education: Bachelors  . Highest education level: Not on file  Occupational History  . Occupation: Retired Glass blower/designer  . Financial resource strain: Not on file  . Food insecurity:    Worry: Not on file    Inability: Not on file  . Transportation needs:    Medical: Not on file    Non-medical: Not on file  Tobacco Use  . Smoking status: Former Smoker    Packs/day: 1.00    Years: 16.00    Pack years: 16.00    Types: Cigarettes  . Smokeless tobacco: Never Used  . Tobacco comment: Quit smoking cigarettes in 1971  Substance and Sexual Activity  . Alcohol use: Yes    Alcohol/week: 1.2 oz     Types: 1 Cans of beer, 1 Shots of liquor per week  . Drug use: No  . Sexual activity: Yes  Lifestyle  . Physical activity:    Days per week: Not on file    Minutes per session: Not on file  . Stress: Not on file  Relationships  . Social connections:    Talks on phone: Not on file    Gets together: Not on file    Attends religious service: Not on file    Active member of club or organization: Not on file    Attends meetings of clubs or organizations: Not on file    Relationship status: Not on file  . Intimate partner violence:    Fear of current or ex partner: Not on file    Emotionally abused: Not on file    Physically abused: Not on file    Forced sexual activity: Not on file  Other Topics Concern  . Not on file  Social History Narrative   Lives at home with his wife.   Right-handed.   1 cup coffee per day.  Occasional Coke.     BP (!) 158/80   Pulse 64   Ht 5' 11"  (1.803 m)   Wt 180 lb (81.6 kg)   BMI 25.10 kg/m   Physical Exam:  Well appearing NAD HEENT: Unremarkable Neck:  No JVD, no thyromegally Lymphatics:  No adenopathy Back:  No CVA tenderness Lungs:  Clear with no wheezes HEART:  Regular rate rhythm, 2/6 high pitched murmur with reduced A2 Abd:  soft, positive bowel sounds, no organomegally, no rebound, no guarding Ext:  2 plus pulses, no edema, no cyanosis, no clubbing Skin:  No rashes no nodules Neuro:  CN II through XII intact, motor grossly intact  EKG - atrial fib with ventricular pacing  DEVICE  Normal device function.  See PaceArt for details.   Assess/Plan: 1. CHB - he is asymptomatic s/p PPM insertion. 2. Atrial fib - his rate is well controlled and he does not have any palpitations.  3. Aortic stenosis - 2 years ago his mean gradient was 34 and I am concerned that it has worsened. We will repeat his 2D echo. He would be a candidate for TAVR but he currently does not have enough symptoms to warrant replacement. 4. PPM - his [employer] DDD  PM is working normally. Will  recheck in several months.  Mikle Bosworth.D.

## 2018-03-15 NOTE — Patient Instructions (Addendum)
Medication Instructions:  Your physician recommends that you continue on your current medications as directed. Please refer to the Current Medication list given to you today.  Labwork: None ordered.  Testing/Procedures: Your physician has requested that you have an echocardiogram. Echocardiography is a painless test that uses sound waves to create images of your heart. It provides your doctor with information about the size and shape of your heart and how well your heart's chambers and valves are working. This procedure takes approximately one hour. There are no restrictions for this procedure.  Please schedule for ECHO  Follow-Up: Your physician wants you to follow-up in: 3-4 months with Dr.Taylor.      Remote monitoring is used to monitor your Pacemaker from home. This monitoring reduces the number of office visits required to check your device to one time per year. It allows [place] to keep an eye on the functioning of your device to ensure it is working properly. You are scheduled for a device check from home on 06/14/2018. You may send your transmission at any time that day. If you have a wireless device, the transmission will be sent automatically. After your physician reviews your transmission, you will receive a postcard with your next transmission date.  Any Other Special Instructions Will Be Listed Below (If Applicable).  If you need a refill on your cardiac medications before your next appointment, please call your pharmacy.

## 2018-03-24 ENCOUNTER — Ambulatory Visit (HOSPITAL_COMMUNITY): Payer: Medicare Other | Attending: Cardiovascular Disease

## 2018-03-24 ENCOUNTER — Other Ambulatory Visit: Payer: Self-pay

## 2018-03-24 DIAGNOSIS — I350 Nonrheumatic aortic (valve) stenosis: Secondary | ICD-10-CM

## 2018-03-24 DIAGNOSIS — Z950 Presence of cardiac pacemaker: Secondary | ICD-10-CM | POA: Diagnosis not present

## 2018-03-24 DIAGNOSIS — E7800 Pure hypercholesterolemia, unspecified: Secondary | ICD-10-CM | POA: Insufficient documentation

## 2018-03-24 DIAGNOSIS — I77810 Thoracic aortic ectasia: Secondary | ICD-10-CM | POA: Insufficient documentation

## 2018-03-24 DIAGNOSIS — I083 Combined rheumatic disorders of mitral, aortic and tricuspid valves: Secondary | ICD-10-CM | POA: Diagnosis not present

## 2018-03-27 ENCOUNTER — Telehealth: Payer: Self-pay

## 2018-03-27 ENCOUNTER — Encounter: Payer: Self-pay | Admitting: Internal Medicine

## 2018-03-27 NOTE — Telephone Encounter (Signed)
Left message with details of echo-requested family notify this nurse if they would like an appt with Dr. Lovena Le to discuss.

## 2018-04-04 DIAGNOSIS — I351 Nonrheumatic aortic (valve) insufficiency: Secondary | ICD-10-CM | POA: Diagnosis not present

## 2018-04-04 DIAGNOSIS — R454 Irritability and anger: Secondary | ICD-10-CM | POA: Diagnosis not present

## 2018-04-10 LAB — CUP PACEART INCLINIC DEVICE CHECK
Date Time Interrogation Session: 20190612040000
Implantable Lead Implant Date: 20170510
Implantable Lead Location: 753859
Implantable Lead Location: 753860
Implantable Lead Model: 7741
Implantable Lead Serial Number: 729262
Implantable Lead Serial Number: 766721
Implantable Pulse Generator Implant Date: 20170510
Lead Channel Impedance Value: 804 Ohm
Lead Channel Setting Pacing Amplitude: 1.3 V
Lead Channel Setting Pacing Pulse Width: 0.4 ms
Lead Channel Setting Sensing Sensitivity: 2.5 mV
MDC IDC LEAD IMPLANT DT: 20170510
MDC IDC MSMT LEADCHNL RV IMPEDANCE VALUE: 711 Ohm
MDC IDC PG SERIAL: 748273

## 2018-04-12 NOTE — Progress Notes (Signed)
 Valve Clinic Consult Note   Chief Complaint  Patient presents with  . New Patient (Initial Visit)    Severe AS, TAVR   History of Present Illness: 84 yo male with history of atrial fibrillation, aortic stenosis, complete heart block s/p PPM, hyperlipidemia who is referred today as a new patient for further evaluation of his aortic stenosis. His aortic stenosis has been moderate and followed closely by Dr. Taylor over the past several years. He had complete heart block and a permanent pacemaker was implanted. Echo June 2019 with normal LV systolic function, LVEF=60-65%. There was severe aortic stenosis with thickened and calcified leaflets, mean gradient 45 mmHg, peak gradient 68 mmHg, AVA 1.08 cm2, CVI 0.31. There was mild aortic valve insufficiency.   He has no chest pain, dyspnea, LE edema. He has some dizziness but no syncope. He sees a dentist on a six month basis. He lives with his wife. He is a retired engineer. He had cycled several days per week up until the last year. His son Yamato is a large animal vet and is here along with the patient's wife today.   Primary Care Physician: Pharr, Walter, MD Primary Cardiologist: Gregg Taylor Referring Cardiologist: Gregg Taylor  Past Medical History:  Diagnosis Date  . Aortic insufficiency    Echo (04/2013):  Mild LVH, EF 55-60%, Gr 1 DD, mild AS (mean 21 mmHg), mild AI, MAC, mild MR, mild LAE, mild RVE, mod RAE, PASP 39  . Aortic stenosis   . Arthritis    "right arm/shoulder" (02/10/2016)  . Cerumen impaction   . Complete heart block (HCC) 02/10/2016  . Dizziness   . Goiter   . Hay fever   . Headache    "related to sinus pressure" (02/10/2016)  . Heart murmur   . Hx of echocardiogram    Echo (04/2014): Mild LVH, EF 55-60%, no RWMA, mild AS (mean gradient 24 mm Hg), mild AI, mild to mod MS (mean 5 mm Hg), mod to severe LAE, mild RAE, PASP 36 mm Hg  . Hypercholesteremia   . Memory loss   . Nasal septal deviation   . Persistent atrial  fibrillation (HCC)   . Skin cancer of scalp    "check and have them burned off prn q 6 months" (02/10/2016)  . Snoring   . Tremor     Past Surgical History:  Procedure Laterality Date  . CATARACT EXTRACTION W/ INTRAOCULAR LENS  IMPLANT, BILATERAL Bilateral ~ 2000  . EP IMPLANTABLE DEVICE N/A 02/11/2016   Procedure: Pacemaker Implant;  Surgeon: Gregg W Taylor, MD;  Location: [facility];  Service: Cardiovascular;  Laterality: N/A;  . TONSILLECTOMY  1930s  . WISDOM TOOTH EXTRACTION  1990s    Current Outpatient Medications  Medication Sig Dispense Refill  . acetaminophen (TYLENOL) 325 MG tablet Take 325 mg by mouth every 6 (six) hours as needed for headache.    . apixaban (ELIQUIS) 5 MG TABS tablet Take 1 tablet (5 mg total) by mouth 2 (two) times daily. 180 tablet 3  . atorvastatin (LIPITOR) 40 MG tablet Take 1 tablet (40 mg total) by mouth daily. 90 tablet 2  . Cholecalciferol (VITAMIN D-3) 1000 units CAPS Take 1,000 Units by mouth daily. Take once at 7 am, once again at 7 pm    . donepezil (ARICEPT) 10 MG tablet Take 1 tablet (10 mg total) by mouth at bedtime. 90 tablet 4  . ipratropium (ATROVENT) 0.06 % nasal spray 1-2 puffs ea side up to   4 times daily for chronic runny nose.    . L-Methylfolate-Algae-B12-B6 (METANX) 3-90.314-2-35 MG CAPS Take 1 capsule by mouth 2 (two) times daily. 180 capsule 4  . memantine (NAMENDA) 10 MG tablet Take 1 tablet (10 mg total) by mouth 2 (two) times daily. 180 tablet 4  . QUEtiapine (SEROQUEL) 25 MG tablet Take 1 tablet (25 mg total) by mouth at bedtime. 90 tablet 3  . sertraline (ZOLOFT) 50 MG tablet Take 50 mg by mouth as directed.    . vitamin C (ASCORBIC ACID) 500 MG tablet Take 500 mg by mouth daily.     No current facility-administered medications for this visit.     No Known Allergies  Social History   Socioeconomic History  . Marital status: Married    Spouse name: Not on file  . Number of children: 2  . Years of education:  Bachelors  . Highest education level: Not on file  Occupational History  . Occupation: Retired Engineer  Social Needs  . Financial resource strain: Not on file  . Food insecurity:    Worry: Not on file    Inability: Not on file  . Transportation needs:    Medical: Not on file    Non-medical: Not on file  Tobacco Use  . Smoking status: Former Smoker    Packs/day: 1.00    Years: 16.00    Pack years: 16.00    Types: Cigarettes  . Smokeless tobacco: Never Used  . Tobacco comment: Quit smoking cigarettes in 1971  Substance and Sexual Activity  . Alcohol use: Never    Alcohol/week: 1.2 oz    Types: 1 Cans of beer, 1 Shots of liquor per week    Frequency: Never  . Drug use: No  . Sexual activity: Yes  Lifestyle  . Physical activity:    Days per week: Not on file    Minutes per session: Not on file  . Stress: Not on file  Relationships  . Social connections:    Talks on phone: Not on file    Gets together: Not on file    Attends religious service: Not on file    Active member of club or organization: Not on file    Attends meetings of clubs or organizations: Not on file    Relationship status: Not on file  . Intimate partner violence:    Fear of current or ex partner: Not on file    Emotionally abused: Not on file    Physically abused: Not on file    Forced sexual activity: Not on file  Other Topics Concern  . Not on file  Social History Narrative   Lives at home with his wife.   Right-handed.   1 cup coffee per day.  Occasional Coke.    Family History  Problem Relation Age of Onset  . Stroke Unknown   . Irregular heart beat Unknown   . Stroke Mother   . Stroke Father   . CAD Father        MI    Review of Systems:  As stated in the HPI and otherwise negative.   BP (!) 166/88   Pulse 72   Ht 5' 11" (1.803 m)   Wt 179 lb 4 oz (81.3 kg)   BMI 25.00 kg/m   Physical Examination: General: Well developed, well nourished, NAD  HEENT: OP clear, mucus membranes  moist  SKIN: warm, dry. No rashes. Neuro: No focal deficits  Musculoskeletal: Muscle strength 5/5 all ext    Psychiatric: Mood and affect normal  Neck: No JVD, no carotid bruits, no thyromegaly, no lymphadenopathy.  Lungs:Clear bilaterally, no wheezes, rhonci, crackles Cardiovascular: Regular rate and rhythm. Loud, harsh late peaking systolic murmur.  Abdomen:Soft. Bowel sounds present. Non-tender.  Extremities: No lower extremity edema. Pulses are 2 + in the bilateral DP/PT.  Echo 03/24/18: - Left ventricle: The cavity size was normal. Wall thickness was   increased in a pattern of mild LVH. Systolic function was normal.   The estimated ejection fraction was in the range of 60% to 65%.   Wall motion was normal; there were no regional wall motion   abnormalities. The study is not technically sufficient to allow   evaluation of LV diastolic function. Doppler parameters are   consistent with high ventricular filling pressure. - Aortic valve: Valve mobility was restricted. There was severe   stenosis. There was mild to moderate regurgitation. Peak velocity   (S): 416 cm/s. Mean gradient (S): 45 mm Hg. Regurgitation   pressure half-time: 585 ms. - Aorta: Ascending aortic diameter: 42 mm (S). - Ascending aorta: The ascending aorta was mildly dilated. - Mitral valve: Severely calcified annulus. Transvalvular velocity   was within the normal range. There was no evidence for stenosis.   There was mild regurgitation. - Left atrium: The atrium was severely dilated. - Right ventricle: The cavity size was normal. Wall thickness was   normal. Systolic function was normal. - Right atrium: The atrium was moderately dilated. - Atrial septum: No defect or patent foramen ovale was identified. - Tricuspid valve: There was moderate regurgitation. - Pulmonary arteries: Systolic pressure was moderately increased.   PA peak pressure: 51 mm Hg  (S).  ------------------------------------------------------------------- Labs, prior tests, procedures, and surgery: Pacemaker.  ------------------------------------------------------------------- Study data:   Study status:  Routine.  Procedure:  The patient reported no pain pre or post test. Transthoracic echocardiography. Image quality was adequate.  Study completion:  There were no complications.          Echocardiography.  M-mode, complete 2D, spectral Doppler, and color Doppler.  Birthdate:  Patient birthdate: 06/20/1932.  Age:  Patient is 84 yr old.  Sex:  Gender: male.    BMI: 25.1 kg/m^2.  Blood pressure:     158/80  Patient status:  Outpatient.  Study date:  Study date: 03/24/2018. Study time: 11:49 AM.  Location:  [facility]  -------------------------------------------------------------------  ------------------------------------------------------------------- Left ventricle:  The cavity size was normal. Wall thickness was increased in a pattern of mild LVH. Systolic function was normal. The estimated ejection fraction was in the range of 60% to 65%. Wall motion was normal; there were no regional wall motion abnormalities. The study is not technically sufficient to allow evaluation of LV diastolic function. Doppler parameters are consistent with high ventricular filling pressure.  ------------------------------------------------------------------- Aortic valve:   Trileaflet; severely thickened, severely calcified leaflets. Valve mobility was restricted.  Doppler:   There was severe stenosis.   There was mild to moderate regurgitation.    VTI ratio of LVOT to aortic valve: 0.34. Valve area (VTI): 1.08 cm^2. Indexed valve area (VTI): 0.53 cm^2/m^2. Peak velocity ratio of LVOT to aortic valve: 0.36. Valve area (Vmax): 1.14 cm^2. Indexed valve area (Vmax): 0.56 cm^2/m^2. Mean velocity ratio of LVOT to aortic valve: 0.31. Valve area (Vmean): 0.97 cm^2. Indexed  valve area (Vmean): 0.48 cm^2/m^2.    Mean gradient (S): 45 mm Hg. Peak gradient (S): 68 mm Hg.  ------------------------------------------------------------------- Aorta:  Aortic root: The aortic root was   normal in size. Ascending aorta: The ascending aorta was mildly dilated.  ------------------------------------------------------------------- Mitral valve:   Severely calcified annulus. Mobility was not restricted.  Doppler:  Transvalvular velocity was within the normal range. There was no evidence for stenosis. There was mild regurgitation.    Peak gradient (D): 13 mm Hg.  ------------------------------------------------------------------- Left atrium:  The atrium was severely dilated.  ------------------------------------------------------------------- Atrial septum:  No defect or patent foramen ovale was identified.   ------------------------------------------------------------------- Right ventricle:  The cavity size was normal. Wall thickness was normal. Systolic function was normal.  ------------------------------------------------------------------- Pulmonic valve:    Doppler:  Transvalvular velocity was within the normal range. There was no evidence for stenosis.  ------------------------------------------------------------------- Tricuspid valve:   Structurally normal valve.    Doppler: Transvalvular velocity was within the normal range. There was moderate regurgitation.  ------------------------------------------------------------------- Pulmonary artery:   The main pulmonary artery was normal-sized. Systolic pressure was moderately increased.  ------------------------------------------------------------------- Right atrium:  The atrium was moderately dilated.  ------------------------------------------------------------------- Pericardium:  There was no pericardial  effusion.  ------------------------------------------------------------------- Systemic veins: Inferior vena cava: The vessel was dilated. The respirophasic diameter changes were blunted (< 50%), consistent with elevated central venous pressure.  ------------------------------------------------------------------- Measurements   Left ventricle                            Value          Reference  LV ID, ED, PLAX chordal                   45.8  mm       43 - 52  LV ID, ES, PLAX chordal                   26    mm       23 - 38  LV fx shortening, PLAX chordal            43    %        >=29  LV PW thickness, ED                       11.4  mm       ---------  IVS/LV PW ratio, ED                       0.96           <=1.3  Stroke volume, 2D                         96    ml       ---------  Stroke volume/bsa, 2D                     47    ml/m^2   ---------  LV e&', lateral                            7.79  cm/s     ---------  LV E/e&', lateral                          23.11          ---------  LV e&', medial                               6.47  cm/s     ---------  LV E/e&', medial                           27.82          ---------  LV e&', average                            7.13  cm/s     ---------  LV E/e&', average                          25.25          ---------    Ventricular septum                        Value          Reference  IVS thickness, ED                         11    mm       ---------    LVOT                                      Value          Reference  LVOT ID, S                                20    mm       ---------  LVOT area                                 3.14  cm^2     ---------  LVOT ID                                   20    mm       ---------  LVOT peak velocity, S                     151   cm/s     ---------  LVOT mean velocity, S                     99.4  cm/s     ---------  LVOT VTI, S                               30.6  cm       ---------  LVOT peak gradient,  S                     9     mm Hg    ---------  Stroke volume (SV), LVOT DP               96.1  ml       ---------  Stroke index (SV/bsa), LVOT DP            47.4  ml/m^2   ---------      Aortic valve                              Value          Reference  Aortic valve peak velocity, S             416   cm/s     ---------  Aortic valve mean velocity, S             322   cm/s     ---------  Aortic valve VTI, S                       88.7  cm       ---------  Aortic mean gradient, S                   45    mm Hg    ---------  Aortic peak gradient, S                   68    mm Hg    ---------  VTI ratio, LVOT/AV                        0.34           ---------  Aortic valve area, VTI                    1.08  cm^2     ---------  Aortic valve area/bsa, VTI                0.53  cm^2/m^2 ---------  Velocity ratio, peak, LVOT/AV             0.36           ---------  Aortic valve area, peak velocity          1.14  cm^2     ---------  Aortic valve area/bsa, peak               0.56  cm^2/m^2 ---------  velocity  Velocity ratio, mean, LVOT/AV             0.31           ---------  Aortic valve area, mean velocity          0.97  cm^2     ---------  Aortic valve area/bsa, mean               0.48  cm^2/m^2 ---------  velocity  Aortic regurg pressure half-time          585   ms       ---------    Aorta                                     Value          Reference  Aortic root ID, ED                        35    mm       ---------  Ascending aorta ID, A-P, S                42    mm       ---------      Left atrium                               Value          Reference  LA ID, A-P, ES                            49    mm       ---------  LA ID/bsa, A-P                    (H)     2.41  cm/m^2   <=2.2  LA volume, S                              106   ml       ---------  LA volume/bsa, S                          52.2  ml/m^2   ---------  LA volume, ES, 1-p A4C                    81    ml       ---------  LA  volume/bsa, ES, 1-p A4C                39.9  ml/m^2   ---------  LA volume, ES, 1-p A2C                    117   ml       ---------  LA volume/bsa, ES, 1-p A2C                57.6  ml/m^2   ---------    Mitral valve                              Value          Reference  Mitral E-wave peak velocity               180   cm/s     ---------  Mitral deceleration time          (H)     278   ms       150 - 230  Mitral peak gradient, D                   13    mm Hg    ---------    Pulmonary arteries                        Value          Reference  PA pressure, S, DP                (H)     51    mm Hg    <=30    Tricuspid valve                           Value          Reference  Tricuspid regurg peak velocity            300     cm/s     ---------  Tricuspid peak RV-RA gradient             36    mm Hg    ---------    Systemic veins                            Value          Reference  Estimated CVP                             15    mm Hg    ---------    Right ventricle                           Value          Reference  RV pressure, S, DP                (H)     51    mm Hg    <=30  RV s&', lateral, S                         16.7  cm/s     ---------   EKG:  EKG is ordered today. The ekg ordered today demonstrates Atrial fibrillation, vent pacing  Recent Labs: No results found for requested labs within last 8760 hours.   Lipid Panel No results found for: CHOL, TRIG, HDL, CHOLHDL, VLDL, LDLCALC, LDLDIRECT   Wt Readings from Last 3 Encounters:  04/13/18 179 lb 4 oz (81.3 kg)  03/15/18 180 lb (81.6 kg)  12/19/17 178 lb (80.7 kg)     Other studies Reviewed: Additional studies/ records that were reviewed today include: Echo images, office notes, EKG.  Review of the above records demonstrates: Severe AS   Assessment and Plan:   1. Severe aortic valve stenosis: He has severe aortic valve stenosis. He is having dizziness but no syncope. I have personally reviewed the echo images. The aortic  valve is thickened and calcified with limited leaflet mobility. I think he would benefit from AVR. Given advanced age, he is not a good candidate for conventional AVR by surgical approach. I think he may be a good candidate for TAVR.   STS Risk Score:  Procedure: Isolated AVR  Risk of Mortality: 2.943%  Renal Failure: 2.687%  Permanent Stroke: 2.135%  Prolonged Ventilation: 7.937%  DSW Infection: 0.076%  Reoperation: 4.954%  Morbidity or Mortality: 13.575%  Short Length of Stay: 30.328%  Long Length of Stay: 6.158%   I have reviewed the natural history of aortic stenosis with the patient and their family members  who are present today. We have discussed the limitations of medical therapy and the poor prognosis associated with symptomatic aortic stenosis. We have reviewed potential treatment options, including palliative medical therapy, conventional surgical aortic valve replacement, and transcatheter aortic valve replacement. We discussed treatment options in the context of the patient's specific comorbid medical conditions.   He would like to proceed with planning for TAVR. I will arrange a right and left heart catheterization at [facility] 05/03/18 at 8:30am. Risks and benefits of the cath procedure and the TAVR procedure are reviewed with the patient. After the cath, he will have a cardiac CT, CTA of the chest/abdomen and pelvis, PFTs, carotid dopplers, PT assessment and will then be referred to   see one of the CT surgeons on our TAVR team. Pre-cath labs today.    Current medicines are reviewed at length with the patient today.  The patient does not have concerns regarding medicines.  The following changes have been made:  no change  Labs/ tests ordered today include:   Orders Placed This Encounter  Procedures  . CBC  . Basic Metabolic Panel (BMET)  . EKG 12-Lead  . EKG 12-Lead     Disposition:   FU with the TAVR team as planned.    Signed, Morayo Leven, MD 04/13/2018 10:47  AM    [facility] [address], [place], [place]  [address] Phone: [phone]; Fax: [phone]   

## 2018-04-12 NOTE — H&P (View-Only) (Signed)
[facility] Clinic Consult Note   Chief Complaint  Patient presents with  . New Patient (Initial Visit)    Severe AS, TAVR   History of Present Illness: 84 yo male with history of atrial fibrillation, aortic stenosis, complete heart block s/p PPM, hyperlipidemia who is referred today as a new patient for further evaluation of his aortic stenosis. His aortic stenosis has been moderate and followed closely by Dr. Lovena Le over the past several years. He had complete heart block and a permanent pacemaker was implanted. Echo June 2019 with normal LV systolic function, RDEY=81-44%. There was severe aortic stenosis with thickened and calcified leaflets, mean gradient 45 mmHg, peak gradient 68 mmHg, AVA 1.08 cm2, CVI 0.31. There was mild aortic valve insufficiency.   He has no chest pain, dyspnea, LE edema. He has some dizziness but no syncope. He sees a Pharmacist, community on a six month basis. He lives with his wife. He is a retired Chief Financial Officer. He had cycled several days per week up until the last year. His son Jurell is a Retail banker and is here along with the patient's wife today.   Primary Care Physician: [name], MD Primary Cardiologist: Cristopher Peru Referring Cardiologist: Cristopher Peru  Past Medical History:  Diagnosis Date  . Aortic insufficiency    Echo (04/2013):  Mild LVH, EF 55-60%, Gr 1 DD, mild AS (mean 21 mmHg), mild AI, MAC, mild MR, mild LAE, mild RVE, mod RAE, PASP 39  . Aortic stenosis   . Arthritis    "right arm/shoulder" (02/10/2016)  . Cerumen impaction   . Complete heart block ([place]) 02/10/2016  . Dizziness   . Goiter   . Hay fever   . Headache    "related to sinus pressure" (02/10/2016)  . Heart murmur   . Hx of echocardiogram    Echo (04/2014): Mild LVH, EF 55-60%, no RWMA, mild AS (mean gradient 24 mm Hg), mild AI, mild to mod MS (mean 5 mm Hg), mod to severe LAE, mild RAE, PASP 36 mm Hg  . Hypercholesteremia   . Memory loss   . Nasal septal deviation   . Persistent atrial  fibrillation ([place])   . Skin cancer of scalp    "check and have them burned off prn q 6 months" (02/10/2016)  . Snoring   . Tremor     Past Surgical History:  Procedure Laterality Date  . CATARACT EXTRACTION W/ INTRAOCULAR LENS  IMPLANT, BILATERAL Bilateral ~ 2000  . EP IMPLANTABLE DEVICE N/A 02/11/2016   Procedure: Pacemaker Implant;  Surgeon: Rackley Lance, MD;  Location: [facility];  Service: Cardiovascular;  Laterality: N/A;  . TONSILLECTOMY  1930s  . WISDOM TOOTH EXTRACTION  1990s    Current Outpatient Medications  Medication Sig Dispense Refill  . acetaminophen (TYLENOL) 325 MG tablet Take 325 mg by mouth every 6 (six) hours as needed for headache.    Marland Kitchen apixaban (ELIQUIS) 5 MG TABS tablet Take 1 tablet (5 mg total) by mouth 2 (two) times daily. 180 tablet 3  . atorvastatin (LIPITOR) 40 MG tablet Take 1 tablet (40 mg total) by mouth daily. 90 tablet 2  . Cholecalciferol (VITAMIN D-3) 1000 units CAPS Take 1,000 Units by mouth daily. Take once at 7 am, once again at 7 pm    . donepezil (ARICEPT) 10 MG tablet Take 1 tablet (10 mg total) by mouth at bedtime. 90 tablet 4  . ipratropium (ATROVENT) 0.06 % nasal spray 1-2 puffs ea side up to  4 times daily for chronic runny nose.    Marland Kitchen L-Methylfolate-Algae-B12-B6 (METANX) 3-90.314-2-35 MG CAPS Take 1 capsule by mouth 2 (two) times daily. 180 capsule 4  . memantine (NAMENDA) 10 MG tablet Take 1 tablet (10 mg total) by mouth 2 (two) times daily. 180 tablet 4  . QUEtiapine (SEROQUEL) 25 MG tablet Take 1 tablet (25 mg total) by mouth at bedtime. 90 tablet 3  . sertraline (ZOLOFT) 50 MG tablet Take 50 mg by mouth as directed.    . vitamin C (ASCORBIC ACID) 500 MG tablet Take 500 mg by mouth daily.     No current facility-administered medications for this visit.     No Known Allergies  Social History   Socioeconomic History  . Marital status: Married    Spouse name: Not on file  . Number of children: 2  . Years of education:  Bachelors  . Highest education level: Not on file  Occupational History  . Occupation: Retired Glass blower/designer  . Financial resource strain: Not on file  . Food insecurity:    Worry: Not on file    Inability: Not on file  . Transportation needs:    Medical: Not on file    Non-medical: Not on file  Tobacco Use  . Smoking status: Former Smoker    Packs/day: 1.00    Years: 16.00    Pack years: 16.00    Types: Cigarettes  . Smokeless tobacco: Never Used  . Tobacco comment: Quit smoking cigarettes in 1971  Substance and Sexual Activity  . Alcohol use: Never    Alcohol/week: 1.2 oz    Types: 1 Cans of beer, 1 Shots of liquor per week    Frequency: Never  . Drug use: No  . Sexual activity: Yes  Lifestyle  . Physical activity:    Days per week: Not on file    Minutes per session: Not on file  . Stress: Not on file  Relationships  . Social connections:    Talks on phone: Not on file    Gets together: Not on file    Attends religious service: Not on file    Active member of club or organization: Not on file    Attends meetings of clubs or organizations: Not on file    Relationship status: Not on file  . Intimate partner violence:    Fear of current or ex partner: Not on file    Emotionally abused: Not on file    Physically abused: Not on file    Forced sexual activity: Not on file  Other Topics Concern  . Not on file  Social History Narrative   Lives at home with his wife.   Right-handed.   1 cup coffee per day.  Occasional Coke.    Family History  Problem Relation Age of Onset  . Stroke Unknown   . Irregular heart beat Unknown   . Stroke Mother   . Stroke Father   . CAD Father        MI    Review of Systems:  As stated in the HPI and otherwise negative.   BP (!) 166/88   Pulse 72   Ht _0  (1.803 m)   Wt 179 lb 4 oz (81.3 kg)   BMI 25.00 kg/m   Physical Examination: General: Well developed, well nourished, NAD  HEENT: OP clear, mucus membranes  moist  SKIN: warm, dry. No rashes. Neuro: No focal deficits  Musculoskeletal: Muscle strength 5/5 all ext  Psychiatric: Mood and affect normal  Neck: No JVD, no carotid bruits, no thyromegaly, no lymphadenopathy.  Lungs:Clear bilaterally, no wheezes, rhonci, crackles Cardiovascular: Regular rate and rhythm. Loud, harsh late peaking systolic murmur.  Abdomen:Soft. Bowel sounds present. Non-tender.  Extremities: No lower extremity edema. Pulses are 2 + in the bilateral DP/PT.  Echo 03/24/18: - Left ventricle: The cavity size was normal. Wall thickness was   increased in a pattern of mild LVH. Systolic function was normal.   The estimated ejection fraction was in the range of 60% to 65%.   Wall motion was normal; there were no regional wall motion   abnormalities. The study is not technically sufficient to allow   evaluation of LV diastolic function. Doppler parameters are   consistent with high ventricular filling pressure. - Aortic valve: Valve mobility was restricted. There was severe   stenosis. There was mild to moderate regurgitation. Peak velocity   (S): 416 cm/s. Mean gradient (S): 45 mm Hg. Regurgitation   pressure half-time: 585 ms. - Aorta: Ascending aortic diameter: 42 mm (S). - Ascending aorta: The ascending aorta was mildly dilated. - Mitral valve: Severely calcified annulus. Transvalvular velocity   was within the normal range. There was no evidence for stenosis.   There was mild regurgitation. - Left atrium: The atrium was severely dilated. - Right ventricle: The cavity size was normal. Wall thickness was   normal. Systolic function was normal. - Right atrium: The atrium was moderately dilated. - Atrial septum: No defect or patent foramen ovale was identified. - Tricuspid valve: There was moderate regurgitation. - Pulmonary arteries: Systolic pressure was moderately increased.   PA peak pressure: 51 mm Hg  (S).  ------------------------------------------------------------------- Labs, prior tests, procedures, and surgery: Pacemaker.  ------------------------------------------------------------------- Study data:   Study status:  Routine.  Procedure:  The patient reported no pain pre or post test. Transthoracic echocardiography. Image quality was adequate.  Study completion:  There were no complications.          Echocardiography.  M-mode, complete 2D, spectral Doppler, and color Doppler.  Birthdate:  Patient birthdate: 1932/05/11.  Age:  Patient is 84 yr old.  Sex:  Gender: male.    BMI: 25.1 kg/m^2.  Blood pressure:     158/80  Patient status:  Outpatient.  Study date:  Study date: 03/24/2018. Study time: 11:49 AM.  Location:  [facility]  -------------------------------------------------------------------  ------------------------------------------------------------------- Left ventricle:  The cavity size was normal. Wall thickness was increased in a pattern of mild LVH. Systolic function was normal. The estimated ejection fraction was in the range of 60% to 65%. Wall motion was normal; there were no regional wall motion abnormalities. The study is not technically sufficient to allow evaluation of LV diastolic function. Doppler parameters are consistent with high ventricular filling pressure.  ------------------------------------------------------------------- Aortic valve:   Trileaflet; severely thickened, severely calcified leaflets. Valve mobility was restricted.  Doppler:   There was severe stenosis.   There was mild to moderate regurgitation.    VTI ratio of LVOT to aortic valve: 0.34. Valve area (VTI): 1.08 cm^2. Indexed valve area (VTI): 0.53 cm^2/m^2. Peak velocity ratio of LVOT to aortic valve: 0.36. Valve area (Vmax): 1.14 cm^2. Indexed valve area (Vmax): 0.56 cm^2/m^2. Mean velocity ratio of LVOT to aortic valve: 0.31. Valve area (Vmean): 0.97 cm^2. Indexed  valve area (Vmean): 0.48 cm^2/m^2.    Mean gradient (S): 45 mm Hg. Peak gradient (S): 68 mm Hg.  ------------------------------------------------------------------- Aorta:  Aortic root: The aortic root was  normal in size. Ascending aorta: The ascending aorta was mildly dilated.  ------------------------------------------------------------------- Mitral valve:   Severely calcified annulus. Mobility was not restricted.  Doppler:  Transvalvular velocity was within the normal range. There was no evidence for stenosis. There was mild regurgitation.    Peak gradient (D): 13 mm Hg.  ------------------------------------------------------------------- Left atrium:  The atrium was severely dilated.  ------------------------------------------------------------------- Atrial septum:  No defect or patent foramen ovale was identified.   ------------------------------------------------------------------- Right ventricle:  The cavity size was normal. Wall thickness was normal. Systolic function was normal.  ------------------------------------------------------------------- Pulmonic valve:    Doppler:  Transvalvular velocity was within the normal range. There was no evidence for stenosis.  ------------------------------------------------------------------- Tricuspid valve:   Structurally normal valve.    Doppler: Transvalvular velocity was within the normal range. There was moderate regurgitation.  ------------------------------------------------------------------- Pulmonary artery:   The main pulmonary artery was normal-sized. Systolic pressure was moderately increased.  ------------------------------------------------------------------- Right atrium:  The atrium was moderately dilated.  ------------------------------------------------------------------- Pericardium:  There was no pericardial  effusion.  ------------------------------------------------------------------- Systemic veins: Inferior vena cava: The vessel was dilated. The respirophasic diameter changes were blunted (< 50%), consistent with elevated central venous pressure.  ------------------------------------------------------------------- Measurements   Left ventricle                            Value          Reference  LV ID, ED, PLAX chordal                   45.8  mm       43 - 52  LV ID, ES, PLAX chordal                   26    mm       23 - 38  LV fx shortening, PLAX chordal            43    %        >=29  LV PW thickness, ED                       11.4  mm       ---------  IVS/LV PW ratio, ED                       0.96           <=1.3  Stroke volume, 2D                         96    ml       ---------  Stroke volume/bsa, 2D                     47    ml/m^2   ---------  LV e&', lateral                            7.79  cm/s     ---------  LV E/e&', lateral                          23.11          ---------  LV e&', medial  6.47  cm/s     ---------  LV E/e&', medial                           27.82          ---------  LV e&', average                            7.13  cm/s     ---------  LV E/e&', average                          25.25          ---------    Ventricular septum                        Value          Reference  IVS thickness, ED                         11    mm       ---------    LVOT                                      Value          Reference  LVOT ID, S                                20    mm       ---------  LVOT area                                 3.14  cm^2     ---------  LVOT ID                                   20    mm       ---------  LVOT peak velocity, S                     151   cm/s     ---------  LVOT mean velocity, S                     99.4  cm/s     ---------  LVOT VTI, S                               30.6  cm       ---------  LVOT peak gradient,  S                     9     mm Hg    ---------  Stroke volume (SV), LVOT DP               96.1  ml       ---------  Stroke index (SV/bsa), LVOT DP            47.4  ml/m^2   ---------  Aortic valve                              Value          Reference  Aortic valve peak velocity, S             416   cm/s     ---------  Aortic valve mean velocity, S             322   cm/s     ---------  Aortic valve VTI, S                       88.7  cm       ---------  Aortic mean gradient, S                   45    mm Hg    ---------  Aortic peak gradient, S                   68    mm Hg    ---------  VTI ratio, LVOT/AV                        0.34           ---------  Aortic valve area, VTI                    1.08  cm^2     ---------  Aortic valve area/bsa, VTI                0.53  cm^2/m^2 ---------  Velocity ratio, peak, LVOT/AV             0.36           ---------  Aortic valve area, peak velocity          1.14  cm^2     ---------  Aortic valve area/bsa, peak               0.56  cm^2/m^2 ---------  velocity  Velocity ratio, mean, LVOT/AV             0.31           ---------  Aortic valve area, mean velocity          0.97  cm^2     ---------  Aortic valve area/bsa, mean               0.48  cm^2/m^2 ---------  velocity  Aortic regurg pressure half-time          585   ms       ---------    Aorta                                     Value          Reference  Aortic root ID, ED                        35    mm       ---------  Ascending aorta ID, A-P, S                42    mm       ---------  Left atrium                               Value          Reference  LA ID, A-P, ES                            49    mm       ---------  LA ID/bsa, A-P                    (H)     2.41  cm/m^2   <=2.2  LA volume, S                              106   ml       ---------  LA volume/bsa, S                          52.2  ml/m^2   ---------  LA volume, ES, 1-p A4C                    81    ml       ---------  LA  volume/bsa, ES, 1-p A4C                39.9  ml/m^2   ---------  LA volume, ES, 1-p A2C                    117   ml       ---------  LA volume/bsa, ES, 1-p A2C                57.6  ml/m^2   ---------    Mitral valve                              Value          Reference  Mitral E-wave peak velocity               180   cm/s     ---------  Mitral deceleration time          (H)     278   ms       150 - 230  Mitral peak gradient, D                   13    mm Hg    ---------    Pulmonary arteries                        Value          Reference  PA pressure, S, DP                (H)     51    mm Hg    <=30    Tricuspid valve                           Value          Reference  Tricuspid regurg peak velocity            300  cm/s     ---------  Tricuspid peak RV-RA gradient             36    mm Hg    ---------    Systemic veins                            Value          Reference  Estimated CVP                             15    mm Hg    ---------    Right ventricle                           Value          Reference  RV pressure, S, DP                (H)     51    mm Hg    <=30  RV s&', lateral, S                         16.7  cm/s     ---------   EKG:  EKG is ordered today. The ekg ordered today demonstrates Atrial fibrillation, vent pacing  Recent Labs: No results found for requested labs within last 8760 hours.   Lipid Panel No results found for: CHOL, TRIG, HDL, CHOLHDL, VLDL, LDLCALC, LDLDIRECT   Wt Readings from Last 3 Encounters:  04/13/18 179 lb 4 oz (81.3 kg)  03/15/18 180 lb (81.6 kg)  12/19/17 178 lb (80.7 kg)     Other studies Reviewed: Additional studies/ records that were reviewed today include: Echo images, office notes, EKG.  Review of the above records demonstrates: Severe AS   Assessment and Plan:   1. Severe aortic valve stenosis: He has severe aortic valve stenosis. He is having dizziness but no syncope. I have personally reviewed the echo images. The aortic  valve is thickened and calcified with limited leaflet mobility. I think he would benefit from AVR. Given advanced age, he is not a good candidate for conventional AVR by surgical approach. I think he may be a good candidate for TAVR.   STS Risk Score:  Procedure: Isolated AVR  Risk of Mortality: 2.943%  Renal Failure: 2.687%  Permanent Stroke: 2.135%  Prolonged Ventilation: 7.937%  DSW Infection: 0.076%  Reoperation: 4.954%  Morbidity or Mortality: 13.575%  Short Length of Stay: 30.328%  Long Length of Stay: 6.158%   I have reviewed the natural history of aortic stenosis with the patient and their family members  who are present today. We have discussed the limitations of medical therapy and the poor prognosis associated with symptomatic aortic stenosis. We have reviewed potential treatment options, including palliative medical therapy, conventional surgical aortic valve replacement, and transcatheter aortic valve replacement. We discussed treatment options in the context of the patient's specific comorbid medical conditions.   He would like to proceed with planning for TAVR. I will arrange a right and left heart catheterization at [facility] 05/03/18 at 8:30am. Risks and benefits of the cath procedure and the TAVR procedure are reviewed with the patient. After the cath, he will have a cardiac CT, CTA of the chest/abdomen and pelvis, PFTs, carotid dopplers, PT assessment and will then be referred to  see one of the CT surgeons on our TAVR team. Pre-cath labs today.    Current medicines are reviewed at length with the patient today.  The patient does not have concerns regarding medicines.  The following changes have been made:  no change  Labs/ tests ordered today include:   Orders Placed This Encounter  Procedures  . CBC  . Basic Metabolic Panel (BMET)  . EKG 12-Lead  . EKG 12-Lead     Disposition:   FU with the TAVR team as planned.    Signed, [name], MD 04/13/2018 10:47  AM    [facility] [place], [place], [place]  [address] Phone: [phone]; Fax: [phone]

## 2018-04-13 ENCOUNTER — Ambulatory Visit (INDEPENDENT_AMBULATORY_CARE_PROVIDER_SITE_OTHER): Payer: Medicare Other | Admitting: Cardiovascular Disease

## 2018-04-13 ENCOUNTER — Encounter: Payer: Self-pay | Admitting: Cardiovascular Disease

## 2018-04-13 VITALS — BP 166/88 | HR 72 | Ht 71.0 in | Wt 179.2 lb

## 2018-04-13 DIAGNOSIS — I350 Nonrheumatic aortic (valve) stenosis: Secondary | ICD-10-CM | POA: Diagnosis not present

## 2018-04-13 LAB — CBC
HEMOGLOBIN: 14.9 g/dL (ref 13.0–17.7)
Hematocrit: 44 % (ref 37.5–51.0)
MCH: 31.6 pg (ref 26.6–33.0)
MCHC: 33.9 g/dL (ref 31.5–35.7)
MCV: 93 fL (ref 79–97)
PLATELETS: 178 10*3/uL (ref 150–450)
RBC: 4.72 x10E6/uL (ref 4.14–5.80)
RDW: 13.5 % (ref 12.3–15.4)
WBC: 8 10*3/uL (ref 3.4–10.8)

## 2018-04-13 LAB — BASIC METABOLIC PANEL
BUN / CREAT RATIO: 13 (ref 10–24)
BUN: 14 mg/dL (ref 8–27)
CO2: 25 mmol/L (ref 20–29)
CREATININE: 1.07 mg/dL (ref 0.76–1.27)
Calcium: 9.8 mg/dL (ref 8.6–10.2)
Chloride: 99 mmol/L (ref 96–106)
GFR calc Af Amer: 73 mL/min/{1.73_m2} (ref >59)
GFR calc non Af Amer: 63 mL/min/{1.73_m2} (ref >59)
GLUCOSE: 73 mg/dL (ref 65–99)
Potassium: 4.4 mmol/L (ref 3.5–5.2)
SODIUM: 140 mmol/L (ref 134–144)

## 2018-04-13 NOTE — Patient Instructions (Addendum)
Medication Instructions:  Your physician recommends that you continue on your current medications as directed. Please refer to the Current Medication list given to you today. See instructions below regarding catheterization.  Labwork: Lab work to be done today--CBC and BMP  Testing/Procedures: Your physician has requested that you have a cardiac catheterization. Cardiac catheterization is used to diagnose and/or treat various heart conditions. Doctors may recommend this procedure for a number of different reasons. The most common reason is to evaluate chest pain. Chest pain can be a symptom of coronary artery disease (CAD), and cardiac catheterization can show whether plaque is narrowing or blocking your heart's arteries. This procedure is also used to evaluate the valves, as well as measure the blood flow and oxygen levels in different parts of your heart. For further information please visit [email or web address]. Please follow instruction sheet, as given.  Scheduled for July 31,2019  Follow-Up: To be arranged after catheterization.   Any Other Special Instructions Will Be Listed Below (If Applicable).     [facility] [address], [address] [place] [address] Dept: [id number] Loc: [phone]  MISCHA POLLARD  04/13/2018  You are scheduled for a Cardiac Catheterization on Wednesday, July 31 with Dr. [name].  1. Please arrive at the [facility] (Main Entrance A) at [facility]: [address] [place], [place] [address] at 6:30 AM (two hours before your procedure to ensure your preparation). Free valet parking service is available.   Special note: Every effort is made to have your procedure done on time. Please understand that emergencies sometimes delay scheduled procedures.  2. Diet:  No solid food after midnight prior to procedure. May have clear liquids until 5 AM day  of procedure.   3. Labs: Lab work done in office on July 11  4. Medication instructions in preparation for your procedure: Stop Eliquis after evening dose on Sunday July 28.     On the morning of your procedure, take Aspirin 81 mg and any morning medicines NOT listed above.  You may use sips of water.  5. Plan for one night stay--bring personal belongings. 6. Bring a current list of your medications and current insurance cards. 7. You MUST have a responsible person to drive you home. 8. Someone MUST be with you the first 24 hours after you arrive home or your discharge will be delayed. 9. Please wear clothes that are easy to get on and off and wear slip-on shoes.  Thank you for allowing [place] to care for you!   -- [facility]   If you need a refill on your cardiac medications before your next appointment, please call your pharmacy.

## 2018-04-30 ENCOUNTER — Other Ambulatory Visit: Payer: Self-pay | Admitting: Neurology

## 2018-05-01 ENCOUNTER — Other Ambulatory Visit: Payer: Self-pay

## 2018-05-01 ENCOUNTER — Telehealth: Payer: Self-pay | Admitting: *Deleted

## 2018-05-01 DIAGNOSIS — R42 Dizziness and giddiness: Secondary | ICD-10-CM

## 2018-05-01 DIAGNOSIS — I350 Nonrheumatic aortic (valve) stenosis: Secondary | ICD-10-CM

## 2018-05-01 NOTE — Telephone Encounter (Addendum)
Catheterization scheduled at [facility] for: Wednesday May 03, 2018 8:30 AM Verified arrival time and place: [place] Entrance A at: 6:30 AM  No solid food after midnight prior to cath, clear liquids until 5 AM day of procedure. Verified allergies in Epic Verified no diabetes medications.  Hold: Apixaban--last dose PM 04/30/18 until post procedure.  Except hold medications AM meds can be  taken pre-cath with sip of water including: ASA 81 mg  Confirmed  patient has responsible person to drive home post procedure and for 24 hours after you arrive home: yes  Discussed instructions with patient's wife, she verbalized understanding, thanked me for call.

## 2018-05-03 ENCOUNTER — Encounter (HOSPITAL_COMMUNITY)
Admission: RE | Disposition: A | Discharge status: Home / Self Care | Payer: Self-pay | Source: Ambulatory Visit | Attending: Cardiovascular Disease

## 2018-05-03 ENCOUNTER — Encounter (HOSPITAL_COMMUNITY): Payer: Self-pay | Admitting: *Deleted

## 2018-05-03 ENCOUNTER — Ambulatory Visit (HOSPITAL_COMMUNITY)
Admission: RE | Admit: 2018-05-03 | Discharge: 2018-05-03 | Disposition: A | Discharge status: Home / Self Care | Payer: Medicare Other | Source: Ambulatory Visit | Attending: Cardiovascular Disease | Admitting: Cardiovascular Disease

## 2018-05-03 ENCOUNTER — Other Ambulatory Visit: Payer: Self-pay

## 2018-05-03 DIAGNOSIS — Z9889 Other specified postprocedural states: Secondary | ICD-10-CM | POA: Diagnosis not present

## 2018-05-03 DIAGNOSIS — I2510 Atherosclerotic heart disease of native coronary artery without angina pectoris: Secondary | ICD-10-CM | POA: Diagnosis not present

## 2018-05-03 DIAGNOSIS — Z823 Family history of stroke: Secondary | ICD-10-CM | POA: Insufficient documentation

## 2018-05-03 DIAGNOSIS — Z87891 Personal history of nicotine dependence: Secondary | ICD-10-CM | POA: Diagnosis not present

## 2018-05-03 DIAGNOSIS — Z85828 Personal history of other malignant neoplasm of skin: Secondary | ICD-10-CM | POA: Insufficient documentation

## 2018-05-03 DIAGNOSIS — I442 Atrioventricular block, complete: Secondary | ICD-10-CM | POA: Insufficient documentation

## 2018-05-03 DIAGNOSIS — I509 Heart failure, unspecified: Secondary | ICD-10-CM | POA: Diagnosis not present

## 2018-05-03 DIAGNOSIS — I481 Persistent atrial fibrillation: Secondary | ICD-10-CM | POA: Diagnosis not present

## 2018-05-03 DIAGNOSIS — Z8249 Family history of ischemic heart disease and other diseases of the circulatory system: Secondary | ICD-10-CM | POA: Insufficient documentation

## 2018-05-03 DIAGNOSIS — Z79899 Other long term (current) drug therapy: Secondary | ICD-10-CM | POA: Diagnosis not present

## 2018-05-03 DIAGNOSIS — I2584 Coronary atherosclerosis due to calcified coronary lesion: Secondary | ICD-10-CM | POA: Diagnosis not present

## 2018-05-03 DIAGNOSIS — Z9842 Cataract extraction status, left eye: Secondary | ICD-10-CM | POA: Insufficient documentation

## 2018-05-03 DIAGNOSIS — M1990 Unspecified osteoarthritis, unspecified site: Secondary | ICD-10-CM | POA: Diagnosis not present

## 2018-05-03 DIAGNOSIS — Z7901 Long term (current) use of anticoagulants: Secondary | ICD-10-CM | POA: Diagnosis not present

## 2018-05-03 DIAGNOSIS — Z9841 Cataract extraction status, right eye: Secondary | ICD-10-CM | POA: Insufficient documentation

## 2018-05-03 DIAGNOSIS — E7800 Pure hypercholesterolemia, unspecified: Secondary | ICD-10-CM | POA: Diagnosis not present

## 2018-05-03 DIAGNOSIS — I350 Nonrheumatic aortic (valve) stenosis: Secondary | ICD-10-CM | POA: Diagnosis not present

## 2018-05-03 HISTORY — PX: RIGHT/LEFT HEART CATH AND CORONARY ANGIOGRAPHY: CATH118266

## 2018-05-03 LAB — POCT I-STAT 3, VENOUS BLOOD GAS (G3P V)
ACID-BASE DEFICIT: 1 mmol/L (ref 0.0–2.0)
Bicarbonate: 24.9 mmol/L (ref 20.0–28.0)
O2 SAT: 60 %
PH VEN: 7.347 (ref 7.250–7.430)
TCO2: 26 mmol/L (ref 22–32)
pCO2, Ven: 45.4 mmHg (ref 44.0–60.0)
pO2, Ven: 33 mmHg (ref 32.0–45.0)

## 2018-05-03 SURGERY — RIGHT/LEFT HEART CATH AND CORONARY ANGIOGRAPHY
Anesthesia: LOCAL

## 2018-05-03 MED ORDER — ACETAMINOPHEN 325 MG PO TABS: 650.0000 mg | ORAL_TABLET | ORAL | Status: DC | PRN

## 2018-05-03 MED ORDER — FENTANYL CITRATE (PF) 100 MCG/2ML IJ SOLN
INTRAMUSCULAR | Status: DC | PRN
  Administered 2018-05-03: 25 ug via INTRAVENOUS

## 2018-05-03 MED ORDER — SODIUM CHLORIDE 0.9 % IV SOLN: 250.0000 mL | INTRAVENOUS | Status: DC | PRN

## 2018-05-03 MED ORDER — HEPARIN (PORCINE) IN NACL 1000-0.9 UT/500ML-% IV SOLN
INTRAVENOUS | Status: AC
  Filled 2018-05-03: qty 1000

## 2018-05-03 MED ORDER — SODIUM CHLORIDE 0.9% FLUSH: 3.0000 mL | Freq: Two times a day (BID) | INTRAVENOUS | Status: DC

## 2018-05-03 MED ORDER — FENTANYL CITRATE (PF) 100 MCG/2ML IJ SOLN
INTRAMUSCULAR | Status: AC
  Filled 2018-05-03: qty 2

## 2018-05-03 MED ORDER — HEPARIN (PORCINE) IN NACL 2-0.9 UNITS/ML
INTRAMUSCULAR | Status: DC | PRN
  Administered 2018-05-03: 10 mL via INTRA_ARTERIAL

## 2018-05-03 MED ORDER — SODIUM CHLORIDE 0.9% FLUSH: 3.0000 mL | INTRAVENOUS | Status: DC | PRN

## 2018-05-03 MED ORDER — ASPIRIN 81 MG PO CHEW: 81.0000 mg | CHEWABLE_TABLET | ORAL | Status: DC

## 2018-05-03 MED ORDER — HEPARIN (PORCINE) IN NACL 1000-0.9 UT/500ML-% IV SOLN
INTRAVENOUS | Status: DC | PRN
  Administered 2018-05-03 (×2): 500 mL

## 2018-05-03 MED ORDER — SODIUM CHLORIDE 0.9 % IV SOLN
INTRAVENOUS | Status: AC
  Administered 2018-05-03: 08:00:00 via INTRAVENOUS

## 2018-05-03 MED ORDER — MIDAZOLAM HCL 2 MG/2ML IJ SOLN
INTRAMUSCULAR | Status: AC
  Filled 2018-05-03: qty 2

## 2018-05-03 MED ORDER — ONDANSETRON HCL 4 MG/2ML IJ SOLN: 4.0000 mg | Freq: Four times a day (QID) | INTRAMUSCULAR | Status: DC | PRN

## 2018-05-03 MED ORDER — LIDOCAINE HCL (PF) 1 % IJ SOLN
INTRAMUSCULAR | Status: DC | PRN
  Administered 2018-05-03 (×2): 2 mL

## 2018-05-03 MED ORDER — VERAPAMIL HCL 2.5 MG/ML IV SOLN
INTRAVENOUS | Status: AC
  Filled 2018-05-03: qty 2

## 2018-05-03 MED ORDER — MIDAZOLAM HCL 2 MG/2ML IJ SOLN
INTRAMUSCULAR | Status: DC | PRN
  Administered 2018-05-03: 1 mg via INTRAVENOUS

## 2018-05-03 MED ORDER — HEPARIN SODIUM (PORCINE) 1000 UNIT/ML IJ SOLN
INTRAMUSCULAR | Status: AC
  Filled 2018-05-03: qty 1

## 2018-05-03 MED ORDER — IOHEXOL 350 MG/ML SOLN
INTRAVENOUS | Status: DC | PRN
  Administered 2018-05-03: 65 mL via INTRA_ARTERIAL

## 2018-05-03 MED ORDER — SODIUM CHLORIDE 0.9 % IV SOLN: INTRAVENOUS | Status: AC

## 2018-05-03 MED ORDER — LIDOCAINE HCL (PF) 1 % IJ SOLN
INTRAMUSCULAR | Status: AC
  Filled 2018-05-03: qty 30

## 2018-05-03 MED ORDER — HEPARIN SODIUM (PORCINE) 1000 UNIT/ML IJ SOLN
INTRAMUSCULAR | Status: DC | PRN
  Administered 2018-05-03: 4000 [IU] via INTRAVENOUS

## 2018-05-03 SURGICAL SUPPLY — 16 items
CATH 5FR JL3.5 JR4 ANG PIG MP (CATHETERS) ×1 IMPLANT
CATH BALLN WEDGE 5F 110CM (CATHETERS) ×1 IMPLANT
CATH EXPO 5F MPA-1 (CATHETERS) ×1 IMPLANT
CATH INFINITI 5FR AL1 (CATHETERS) ×1 IMPLANT
DEVICE RAD COMP TR BAND LRG (VASCULAR PRODUCTS) ×1 IMPLANT
GLIDESHEATH SLEND SS 6F .021 (SHEATH) ×1 IMPLANT
GUIDEWIRE .025 260CM (WIRE) ×1 IMPLANT
GUIDEWIRE INQWIRE 1.5J.035X260 (WIRE) IMPLANT
INQWIRE 1.5J .035X260CM (WIRE) ×2
KIT HEART LEFT (KITS) ×2 IMPLANT
PACK CARDIAC CATHETERIZATION (CUSTOM PROCEDURE TRAY) ×2 IMPLANT
SHEATH GLIDE SLENDER 4/5FR (SHEATH) ×1 IMPLANT
TRANSDUCER W/STOPCOCK (MISCELLANEOUS) ×2 IMPLANT
TUBING CIL FLEX 10 FLL-RA (TUBING) ×2 IMPLANT
WIRE EMERALD 3MM-J .025X260CM (WIRE) ×1 IMPLANT
WIRE EMERALD ST .035X150CM (WIRE) ×1 IMPLANT

## 2018-05-03 NOTE — Discharge Instructions (Signed)
Restart Eliquis on Thursday 05/04/18 if no bleeding from right radial cath site.   Radial Site Care Refer to this sheet in the next few weeks. These instructions provide you with information about caring for yourself after your procedure. Your health care provider may also give you more specific instructions. Your treatment has been planned according to current medical practices, but problems sometimes occur. Call your health care provider if you have any problems or questions after your procedure. What can I expect after the procedure? After your procedure, it is typical to have the following:  Bruising at the radial site that usually fades within 1-2 weeks.  Blood collecting in the tissue (hematoma) that may be painful to the touch. It should usually decrease in size and tenderness within 1-2 weeks.  Follow these instructions at home:  Take medicines only as directed by your health care provider.  You may shower 24-48 hours after the procedure or as directed by your health care provider. Remove the bandage (dressing) and gently wash the site with plain soap and water. Pat the area dry with a clean towel. Do not rub the site, because this may cause bleeding.  Do not take baths, swim, or use a hot tub until your health care provider approves.  Check your insertion site every day for redness, swelling, or drainage.  Do not apply powder or lotion to the site.  Do not flex or bend the affected arm for 24 hours or as directed by your health care provider.  Do not push or pull heavy objects with the affected arm for 24 hours or as directed by your health care provider.  Do not lift over 10 lb (4.5 kg) for 5 days after your procedure or as directed by your health care provider.  Ask your health care provider when it is okay to: ? Return to work or school. ? Resume usual physical activities or sports. ? Resume sexual activity.  Do not drive home if you are discharged the same day as the  procedure. Have someone else drive you.  You may drive 24 hours after the procedure unless otherwise instructed by your health care provider.  Do not operate machinery or power tools for 24 hours after the procedure.  If your procedure was done as an outpatient procedure, which means that you went home the same day as your procedure, a responsible adult should be with you for the first 24 hours after you arrive home.  Keep all follow-up visits as directed by your health care provider. This is important. Contact a health care provider if:  You have a fever.  You have chills.  You have increased bleeding from the radial site. Hold pressure on the site. CALL 911 Get help right away if:  You have unusual pain at the radial site.  You have redness, warmth, or swelling at the radial site.  You have drainage (other than a small amount of blood on the dressing) from the radial site.  The radial site is bleeding, and the bleeding does not stop after 30 minutes of holding steady pressure on the site.  Your arm or hand becomes pale, cool, tingly, or numb. This information is not intended to replace advice given to you by your health care provider. Make sure you discuss any questions you have with your health care provider. Document Released: 10/23/2010 Document Revised: 02/26/2016 Document Reviewed: 04/08/2014 Elsevier Interactive Patient Education  2018 [employer].

## 2018-05-03 NOTE — Interval H&P Note (Signed)
History and Physical Interval Note:  05/03/2018 8:16 AM  Jeffery Rogers  has presented today for cardiac cath with the diagnosis of severe aortic stenosis  The various methods of treatment have been discussed with the patient and family. After consideration of risks, benefits and other options for treatment, the patient has consented to  Procedure(s): RIGHT/LEFT HEART CATH AND CORONARY ANGIOGRAPHY (N/A) as a surgical intervention .  The patient's history has been reviewed, patient examined, no change in status, stable for surgery.  I have reviewed the patient's chart and labs.  Questions were answered to the patient's satisfaction.    Cath Lab Visit (complete for each Cath Lab visit)  Clinical Evaluation Leading to the Procedure:   ACS: No.  Non-ACS:    Anginal Classification: CCS II  Anti-ischemic medical therapy: No Therapy  Non-Invasive Test Results: No non-invasive testing performed  Prior CABG: No previous CABG         [name]

## 2018-05-12 ENCOUNTER — Encounter: Payer: Self-pay | Admitting: Physician Assistant

## 2018-05-15 ENCOUNTER — Telehealth: Payer: Self-pay | Admitting: Neurology

## 2018-05-15 ENCOUNTER — Other Ambulatory Visit: Payer: Self-pay | Admitting: *Deleted

## 2018-05-15 MED ORDER — MEMANTINE HCL 10 MG PO TABS: 10.0000 mg | ORAL_TABLET | Freq: Two times a day (BID) | ORAL | 4 refills | Status: DC

## 2018-05-15 MED ORDER — DONEPEZIL HCL 10 MG PO TABS: 10.0000 mg | ORAL_TABLET | Freq: Every day | ORAL | 4 refills | Status: DC

## 2018-05-15 MED ORDER — METANX 3-90.314-2-35 MG PO CAPS: 1.0000 | ORAL_CAPSULE | Freq: Two times a day (BID) | ORAL | 4 refills | Status: DC

## 2018-05-15 NOTE — Telephone Encounter (Signed)
[name] [name]/DPR called to advise [facility] is awaiting approval from Dr Krista Blue for donepezil (ARICEPT) 10 MG tablet, L-Methylfolate-Algae-B12-B6 (METANX) 3-90.314-2-35 MG CAPS and memantine (NAMENDA) 10 MG tablet. She is also wanting a 90day supply on each.  Please call to advise

## 2018-05-15 NOTE — Telephone Encounter (Signed)
Requested refills sent to [facility] for 90-day prescriptions.

## 2018-05-16 ENCOUNTER — Ambulatory Visit (HOSPITAL_COMMUNITY)
Admission: RE | Admit: 2018-05-16 | Discharge: 2018-05-16 | Disposition: A | Discharge status: Home / Self Care | Payer: Medicare Other | Source: Ambulatory Visit | Attending: Cardiovascular Disease | Admitting: Cardiovascular Disease

## 2018-05-16 ENCOUNTER — Ambulatory Visit (HOSPITAL_BASED_OUTPATIENT_CLINIC_OR_DEPARTMENT_OTHER)
Admission: RE | Admit: 2018-05-16 | Discharge: 2018-05-16 | Disposition: A | Discharge status: Home / Self Care | Payer: Medicare Other | Source: Ambulatory Visit | Attending: Cardiovascular Disease | Admitting: Cardiovascular Disease

## 2018-05-16 ENCOUNTER — Ambulatory Visit: Payer: Medicare Other | Attending: Cardiovascular Disease | Admitting: Physical Therapy

## 2018-05-16 ENCOUNTER — Encounter: Payer: Self-pay | Admitting: Physical Therapy

## 2018-05-16 DIAGNOSIS — R42 Dizziness and giddiness: Secondary | ICD-10-CM

## 2018-05-16 DIAGNOSIS — I700 Atherosclerosis of aorta: Secondary | ICD-10-CM | POA: Diagnosis not present

## 2018-05-16 DIAGNOSIS — I517 Cardiomegaly: Secondary | ICD-10-CM | POA: Diagnosis not present

## 2018-05-16 DIAGNOSIS — I350 Nonrheumatic aortic (valve) stenosis: Secondary | ICD-10-CM

## 2018-05-16 DIAGNOSIS — N3289 Other specified disorders of bladder: Secondary | ICD-10-CM | POA: Diagnosis not present

## 2018-05-16 DIAGNOSIS — K8020 Calculus of gallbladder without cholecystitis without obstruction: Secondary | ICD-10-CM | POA: Insufficient documentation

## 2018-05-16 DIAGNOSIS — K769 Liver disease, unspecified: Secondary | ICD-10-CM | POA: Diagnosis not present

## 2018-05-16 DIAGNOSIS — R2689 Other abnormalities of gait and mobility: Secondary | ICD-10-CM

## 2018-05-16 DIAGNOSIS — I2510 Atherosclerotic heart disease of native coronary artery without angina pectoris: Secondary | ICD-10-CM | POA: Insufficient documentation

## 2018-05-16 DIAGNOSIS — R911 Solitary pulmonary nodule: Secondary | ICD-10-CM | POA: Insufficient documentation

## 2018-05-16 DIAGNOSIS — E041 Nontoxic single thyroid nodule: Secondary | ICD-10-CM | POA: Diagnosis not present

## 2018-05-16 LAB — PULMONARY FUNCTION TEST
DL/VA % PRED: 84 %
DL/VA: 3.93 ml/min/mmHg/L
DLCO unc % pred: 72 %
DLCO unc: 24.48 ml/min/mmHg
FEF 25-75 POST: 2.43 L/s
FEF 25-75 Pre: 1.74 L/sec
FEF2575-%Change-Post: 39 %
FEF2575-%PRED-POST: 131 %
FEF2575-%PRED-PRE: 94 %
FEV1-%Change-Post: 8 %
FEV1-%Pred-Post: 102 %
FEV1-%Pred-Pre: 95 %
FEV1-PRE: 2.67 L
FEV1-Post: 2.89 L
FEV1FVC-%CHANGE-POST: 3 %
FEV1FVC-%PRED-PRE: 100 %
FEV6-%Change-Post: 3 %
FEV6-%Pred-Post: 104 %
FEV6-%Pred-Pre: 100 %
FEV6-Post: 3.88 L
FEV6-Pre: 3.73 L
FEV6FVC-%Change-Post: 0 %
FEV6FVC-%Pred-Post: 106 %
FEV6FVC-%Pred-Pre: 107 %
FVC-%Change-Post: 4 %
FVC-%PRED-POST: 97 %
FVC-%PRED-PRE: 93 %
FVC-POST: 3.91 L
FVC-PRE: 3.74 L
POST FEV1/FVC RATIO: 74 %
PRE FEV1/FVC RATIO: 71 %
Post FEV6/FVC ratio: 99 %
Pre FEV6/FVC Ratio: 100 %
RV % pred: 119 %
RV: 3.34 L
TLC % PRED: 100 %
TLC: 7.28 L

## 2018-05-16 MED ORDER — IOPAMIDOL (ISOVUE-370) INJECTION 76%
100.0000 mL | Freq: Once | INTRAVENOUS | Status: AC | PRN
  Administered 2018-05-16: 100 mL via INTRAVENOUS

## 2018-05-16 MED ORDER — ALBUTEROL SULFATE (2.5 MG/3ML) 0.083% IN NEBU
2.5000 mg | INHALATION_SOLUTION | Freq: Once | RESPIRATORY_TRACT | Status: AC
  Administered 2018-05-16: 2.5 mg via RESPIRATORY_TRACT

## 2018-05-16 MED ORDER — IOPAMIDOL (ISOVUE-370) INJECTION 76%
INTRAVENOUS | Status: AC
  Filled 2018-05-16: qty 100

## 2018-05-16 NOTE — Progress Notes (Signed)
Bilateral carotid duplex completed - Preliminary results - 1% to 39% ICA stenosis bilaterally. Vertebral artery flow is antegrade bilaterally. [facility], RVS 05/16/2018 1:45 PM

## 2018-05-16 NOTE — Therapy (Signed)
[place], [place], [address] Phone: [phone]   Fax:  [phone]  Physical Therapy Evaluation  Patient Details  Name: Jeffery Rogers MRN: [id number] Date of Birth: 1932-04-23 Referring Provider: Burnell Blanks, MD   Encounter Date: 05/16/2018  PT End of Session - 05/16/18 1102    Visit Number  1    Authorization Type  TAVR    PT Start Time  1020    PT Stop Time  1050    PT Time Calculation (min)  30 min    Activity Tolerance  Patient tolerated treatment well    Behavior During Therapy  [facility] for tasks assessed/performed       Past Medical History:  Diagnosis Date  . Arthritis    "right arm/shoulder" (02/10/2016)  . Complete heart block ([place]) 02/10/2016   a. s/p Emergency planning/management officer  . Goiter   . Hypercholesteremia   . Memory loss   . Nasal septal deviation   . Persistent atrial fibrillation ([place])   . Severe aortic stenosis   . Tremor     Past Surgical History:  Procedure Laterality Date  . CATARACT EXTRACTION W/ INTRAOCULAR LENS  IMPLANT, BILATERAL Bilateral ~ 2000  . EP IMPLANTABLE DEVICE N/A 02/11/2016   Procedure: Pacemaker Implant;  Surgeon: Kroner Lance, MD;  Location: [place] [facility];  Service: Cardiovascular;  Laterality: N/A;  . RIGHT/LEFT HEART CATH AND CORONARY ANGIOGRAPHY N/A 05/03/2018   Procedure: RIGHT/LEFT HEART CATH AND CORONARY ANGIOGRAPHY;  Surgeon: Burnell Blanks, MD;  Location: [facility];  Service: Cardiovascular;  Laterality: N/A;  . TONSILLECTOMY  1930s  . WISDOM TOOTH EXTRACTION  1990s    There were no vitals filed for this visit.   Subjective Assessment - 05/16/18 1024    Subjective  I do not feel limited in my walking around the community. Lately feeling a little more winded on hills. I was always very active which I feel helped.     Currently in Pain?  No/denies         [facility] PT Assessment - 05/16/18 0001      Assessment    Medical Diagnosis  severe aortic stenosis    Referring Provider  Burnell Blanks, MD    Onset Date/Surgical Date  --   in last 2-3 mo feeling a little more winded with walking   Hand Dominance  Right      Precautions   Precautions  None      Restrictions   Weight Bearing Restrictions  No      Balance Screen   Has the patient fallen in the past 6 months  No      [place] residence    Living Arrangements  Spouse/significant other    Additional Comments  2 story home      Prior Function   Vocation  Retired      Associate Professor   Overall Cognitive Status  Within Functional Limits for tasks assessed      Sensation   Additional Comments  neuropathy bil feet/ankles      Posture/Postural Control   Posture Comments  increased kyphosis with forward head      ROM / Strength   AROM / PROM / Strength  AROM;Strength      AROM   Overall AROM Comments  WFL      Strength   Overall Strength Comments  gross 5/5  Strength Assessment Site  Hand    Right/Left hand  Right;Left    Right Hand Grip (lbs)  100    Left Hand Grip (lbs)  80      Ambulation/Gait   Gait Comments  WFL for step length, width, heel-toe and trunk rotation       [facility] Pre-Surgical Assessment - 05/16/18 0001    5 Meter Walk Test- trial 1  4 sec    5 Meter Walk Test- trial 2  5 sec.     5 Meter Walk Test- trial 3  4 sec.    5 meter walk test average  4.33 sec    Chair to Stand # of Repetitions  10    comment  modified tandem independently    6 Minute Walk- Baseline  yes    BP (mmHg)  141/84    HR (bpm)  60    02 Sat (%RA)  99 %    6 Minute Walk Post Test  yes    BP (mmHg)  184/90    HR (bpm)  75    02 Sat (%RA)  86 %    Modified Borg Scale for Dyspnea  1- Very mild shortness of breath    Perceived Rate of Exertion (Borg)  11- Fairly light    Aerobic Endurance Distance Walked  1195    Endurance additional comments  no rest breaks requested, no LOB, 12.65%  disability            Clinical Impression Statement: Pt is a 84 yo Male presenting to OP PT for evaluation prior to possible TAVR surgery due to severe aortic stenosis. Pt reports onset of mild SOB approximately 2-3 months ago. Pt presents with WFL ROM and strength, good balance and is at low fall risk 4 stage balance test. Pt ambulated a total of 1195 feet in 6 minute walk which is 12.65% disability compared to age-appropriate norm.  Based on the Short Physical Performance Battery, patient has a frailty rating of 7/12 with </= 5/12 considered frail.      Plan - 05/16/18 1424    PT Frequency  --   one-time TAVR evaluation   Consulted and Agree with Plan of Care  Patient;Family member/caregiver    Family Member Consulted  Spouse       Patient will benefit from skilled therapeutic intervention in order to improve the following deficits and impairments:     Visit Diagnosis: Other abnormalities of gait and mobility     Problem List Patient Active Problem List   Diagnosis Date Noted  . Severe aortic stenosis   . Tendinopathy of left rotator cuff 12/19/2017  . Dizziness 05/15/2017  . Elevated CPK 02/18/2017  . Sinusitis 02/18/2017  . Peripheral neuropathy 02/18/2017  . Chronic maxillary sinusitis 02/18/2017  . Mild cognitive impairment 01/12/2017  . Essential tremor 01/12/2017  . Orthostatic dizziness 01/12/2017  . Diplopia 01/12/2017  . Complete heart block ([place]) 02/10/2016  . First degree atrioventricular block 03/29/2013  . Murmur   . Hypercholesteremia   . Aortic insufficiency   . Goiter   . Nasal septal deviation   . Cerumen impaction   . Snoring     Johan Creveling C. Jessicca Stitzer PT, DPT 05/16/18 2:33 PM   [facility] [facility] [address] [place], [place], [address] Phone: [phone]   Fax:  [phone]  Name: Jeffery Rogers MRN: [id number] Date of Birth: 1932/03/24

## 2018-05-17 ENCOUNTER — Telehealth: Payer: Self-pay | Admitting: Cardiovascular Disease

## 2018-05-17 ENCOUNTER — Other Ambulatory Visit: Payer: Self-pay

## 2018-05-17 ENCOUNTER — Institutional Professional Consult (permissible substitution) (INDEPENDENT_AMBULATORY_CARE_PROVIDER_SITE_OTHER): Payer: Medicare Other | Admitting: Surgery

## 2018-05-17 ENCOUNTER — Encounter: Payer: Self-pay | Admitting: Surgery

## 2018-05-17 VITALS — BP 147/75 | HR 60 | Resp 16 | Ht 71.0 in | Wt 175.0 lb

## 2018-05-17 DIAGNOSIS — I350 Nonrheumatic aortic (valve) stenosis: Secondary | ICD-10-CM

## 2018-05-17 NOTE — Telephone Encounter (Signed)
New Message:     Diane from [facility] is calling to give a critical report on the patient.

## 2018-05-17 NOTE — Telephone Encounter (Signed)
I contacted [facility] in regards to called report on pt's CT results.  Per the tech the Radiologist wanted to make [place] aware of findings:  Markedly distended urinary bladder, presumably due to chronic bladder outlet obstruction by the enlarged prostate. No hydronephrosis. Please ensure clinically that there is no acute urinary retention.  The pt is scheduled for follow-up today with Dr Cyndia Bent for further TAVR evaluation. We will review scans at that time with patient.

## 2018-05-17 NOTE — Progress Notes (Signed)
Patient ID: Jeffery Rogers, male   DOB: 1932/05/04, 84 y.o.   MRN: [id number]  [facility] REPORT  Referring Provider is Susan Lance, MD PCP is [name], MD  Chief Complaint  Patient presents with  . Aortic Stenosis    TAVR eval..after ECHO 6/21, CATH 7/31, PFT/CTA C/A/P/HEART, PT EVAL, CAROTID DUPLEX 05/16/18    HPI:  The patient is an 84 year old gentleman with history of hyperlipidemia, persistent atrial fibrillation on Eliquis, complete heart block status post permanent pacemaker in 2017, and aortic stenosis that has been followed by Dr. Lovena Le.  He had an echo in May 2017 which showed a trileaflet aortic valve with severe thickening and calcification and restricted leaflet mobility with a mean gradient of 34 mmHg consistent with moderate aortic stenosis.  He had a follow-up echocardiogram on 03/24/2018 which showed progression to severe aortic stenosis with a mean gradient of 45 mmHg and a peak gradient of 68 mmHg.  Left ventricular ejection fraction was 60 to 65%.  He is here with his wife today.  He says that he is always been very active and used to ride his road bike 60 to 75 miles per week but quit about 3 years ago at his family's insistent due to some memory loss.  He says that he is not as active as he used to be a year ago but denies any shortness of breath or fatigue with his usual daily activity.  His wife said that he does complain of some shortness of breath when he exerts himself and has been tired towards the end of the day.  He denies any chest pain or pressure.  He has had no lower extremity edema.  He said that he has had some dizziness when standing but no syncope.  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 He has a son who is a Animal nutritionist.   Past Medical History:  Diagnosis Date  . Arthritis    "right arm/shoulder" (02/10/2016)  . Complete heart  block ([place]) 02/10/2016   a. s/p Emergency planning/management officer  . Goiter   . Hypercholesteremia   . Memory loss   . Nasal septal deviation   . Persistent atrial fibrillation ([place])   . Severe aortic stenosis   . Tremor     Past Surgical History:  Procedure Laterality Date  . CATARACT EXTRACTION W/ INTRAOCULAR LENS  IMPLANT, BILATERAL Bilateral ~ 2000  . EP IMPLANTABLE DEVICE N/A 02/11/2016   Procedure: Pacemaker Implant;  Surgeon: Cantera Lance, MD;  Location: [facility];  Service: Cardiovascular;  Laterality: N/A;  . RIGHT/LEFT HEART CATH AND CORONARY ANGIOGRAPHY N/A 05/03/2018   Procedure: RIGHT/LEFT HEART CATH AND CORONARY ANGIOGRAPHY;  Surgeon: Burnell Blanks, MD;  Location: [facility];  Service: Cardiovascular;  Laterality: N/A;  . TONSILLECTOMY  1930s  . WISDOM TOOTH EXTRACTION  1990s    Family History  Problem Relation Age of Onset  . Stroke Unknown   . Irregular heart beat Unknown   . Stroke Mother   . Stroke Father   . CAD Father        MI    Social History   Socioeconomic History  . Marital status: Married    Spouse name: Not on file  . Number of children: 2  . Years of education: Bachelors  . Highest education level: Not on file  Occupational History  . Occupation: Retired  Engineer  Social Needs  . Financial resource strain: Not on file  . Food insecurity:    Worry: Not on file    Inability: Not on file  . Transportation needs:    Medical: Not on file    Non-medical: Not on file  Tobacco Use  . Smoking status: Former Smoker    Packs/day: 1.00    Years: 16.00    Pack years: 16.00    Types: Cigarettes  . Smokeless tobacco: Never Used  . Tobacco comment: Quit smoking cigarettes in 1971  Substance and Sexual Activity  . Alcohol use: Never    Alcohol/week: 2.0 standard drinks    Types: 1 Cans of beer, 1 Shots of liquor per week    Frequency: Never  . Drug use: No  . Sexual activity: Yes  Lifestyle  . Physical activity:    Days per  week: Not on file    Minutes per session: Not on file  . Stress: Not on file  Relationships  . Social connections:    Talks on phone: Not on file    Gets together: Not on file    Attends religious service: Not on file    Active member of club or organization: Not on file    Attends meetings of clubs or organizations: Not on file    Relationship status: Not on file  . Intimate partner violence:    Fear of current or ex partner: Not on file    Emotionally abused: Not on file    Physically abused: Not on file    Forced sexual activity: Not on file  Other Topics Concern  . Not on file  Social History Narrative   Lives at home with his wife.   Right-handed.   1 cup coffee per day.  Occasional Coke.    Current Outpatient Medications  Medication Sig Dispense Refill  . apixaban (ELIQUIS) 5 MG TABS tablet Take 1 tablet (5 mg total) by mouth 2 (two) times daily. 180 tablet 3  . atorvastatin (LIPITOR) 40 MG tablet Take 1 tablet (40 mg total) by mouth daily. 90 tablet 2  . Cholecalciferol (VITAMIN D-3) 1000 units CAPS Take 1,000 Units by mouth daily.     Marland Kitchen donepezil (ARICEPT) 10 MG tablet Take 1 tablet (10 mg total) by mouth at bedtime. 90 tablet 4  . ipratropium (ATROVENT) 0.06 % nasal spray Place 1-2 sprays into both nostrils 4 (four) times daily as needed for rhinitis.    Marland Kitchen L-Methylfolate-Algae-B12-B6 (METANX) 3-90.314-2-35 MG CAPS Take 1 capsule by mouth 2 (two) times daily. 180 capsule 4  . memantine (NAMENDA) 10 MG tablet Take 1 tablet (10 mg total) by mouth 2 (two) times daily. 180 tablet 4  . QUEtiapine (SEROQUEL) 25 MG tablet Take 1 tablet (25 mg total) by mouth at bedtime. 90 tablet 3  . sertraline (ZOLOFT) 50 MG tablet Take 50 mg by mouth daily.     . vitamin C (ASCORBIC ACID) 500 MG tablet Take 500 mg by mouth daily.     No current facility-administered medications for this visit.     No Known Allergies    Review of Systems:   General:  normal appetite, decreased energy,  no weight gain, no weight loss, no fever  Cardiac:  no chest pain with exertion, no chest pain at rest, + SOB with exertion according to wife, no resting SOB, no PND, no orthopnea, no palpitations, + arrhythmia, + atrial fibrillation, no LE edema, + dizzy spells, no syncope  Respiratory:  Some exertional shortness of breath, no home oxygen, no productive cough, no dry cough, no bronchitis, no wheezing, no hemoptysis, no asthma, no pain with inspiration or cough, no sleep apnea, no CPAP at night  GI:   no difficulty swallowing, no reflux, no frequent heartburn, no hiatal hernia, no abdominal pain, no constipation, no diarrhea, no hematochezia, no hematemesis, no melena  GU:   no dysuria,  no frequency, no urinary tract infection, no hematuria, + enlarged prostate, no kidney stones, no kidney disease  Vascular:  no pain suggestive of claudication, no pain in feet, no leg cramps, no varicose veins, no DVT, no non-healing foot ulcer  Neuro:   no stroke, no TIA's, no seizures, no headaches, no temporary blindness one eye,  no slurred speech, + peripheral neuropathy, no chronic pain, no instability of gait, + memory/cognitive dysfunction  Musculoskeletal: no arthritis, no joint swelling, no myalgias, no difficulty walking, normal mobility   Skin:   no rash, no itching, no skin infections, no pressure sores or ulcerations  Psych:   no anxiety, no depression, no nervousness, no unusual recent stress  Eyes:   no blurry vision, no floaters, no recent vision changes, does not wear glasses or contacts  ENT:   + hearing loss, mp loose or painful teeth, mp dentures, last saw dentist every six months.  Hematologic:  no easy bruising, no abnormal bleeding, no clotting disorder, no frequent epistaxis  Endocrine:  no diabetes, does not check CBG's at home       Physical Exam:   Ht 5\' 11"  (1.803 m)   BMI 24.41 kg/m   General:  Elderly,  well-appearing  HEENT:  Unremarkable, NCAT, PERLA, EOMI, oropharynx clear,  teeth in good condition  Neck:   no JVD, no bruits, no adenopathy or thyromegaly  Chest:   clear to auscultation, symmetrical breath sounds, no wheezes, no rhonchi   CV:   RRR, grade III/VI crescendo/decrescendo murmur heard best at RSB,  no diastolic murmur  Abdomen:  soft, non-tender, no masses or organomegaly  Extremities:  warm, well-perfused, pulses palpable in feet, no LE edema  Rectal/GU  Deferred  Neuro:   Grossly non-focal and symmetrical throughout  Skin:   Clean and dry, no rashes, no breakdown   Diagnostic Tests:       [facility]                        [address], [place] [address]                            [phone]  ------------------------------------------------------------------- Echocardiography  Patient:    Franco, Duley MR #:       [id number] Study Date: 03/24/2018 Gender:     M Age:        88 Height:     180.3 cm Weight:     81.6 kg BSA:        2.03 m^2 Pt. Status: Room:   ATTENDING    Cristopher Peru, MD  ORDERING     Cristopher Peru, MD  [place], MD  SONOGRAPHER  Cindy Hazy, RDCS  PERFORMING   [facility], Outpatient  cc:  ------------------------------------------------------------------- LV EF: 60% -   65%  ------------------------------------------------------------------- Indications:  I35.0 Aortic Valve Stenosis.  ------------------------------------------------------------------- History:   PMH:  Acquired from the patient and from the patient&'s chart.  PMH:  Aortic insufficiency. Murmur.  Risk factors: Hypercholesterolemia.  ------------------------------------------------------------------- Study Conclusions  - Left ventricle: The cavity size was normal. Wall thickness was   increased in a pattern of mild LVH. Systolic function was normal.   The estimated ejection fraction was in the range of 60% to 65%.   Wall motion was normal; there were no regional  wall motion   abnormalities. The study is not technically sufficient to allow   evaluation of LV diastolic function. Doppler parameters are   consistent with high ventricular filling pressure. - Aortic valve: Valve mobility was restricted. There was severe   stenosis. There was mild to moderate regurgitation. Peak velocity   (S): 416 cm/s. Mean gradient (S): 45 mm Hg. Regurgitation   pressure half-time: 585 ms. - Aorta: Ascending aortic diameter: 42 mm (S). - Ascending aorta: The ascending aorta was mildly dilated. - Mitral valve: Severely calcified annulus. Transvalvular velocity   was within the normal range. There was no evidence for stenosis.   There was mild regurgitation. - Left atrium: The atrium was severely dilated. - Right ventricle: The cavity size was normal. Wall thickness was   normal. Systolic function was normal. - Right atrium: The atrium was moderately dilated. - Atrial septum: No defect or patent foramen ovale was identified. - Tricuspid valve: There was moderate regurgitation. - Pulmonary arteries: Systolic pressure was moderately increased.   PA peak pressure: 51 mm Hg (S).  ------------------------------------------------------------------- Labs, prior tests, procedures, and surgery: Pacemaker.  ------------------------------------------------------------------- Study data:   Study status:  Routine.  Procedure:  The patient reported no pain pre or post test. Transthoracic echocardiography. Image quality was adequate.  Study completion:  There were no complications.          Echocardiography.  M-mode, complete 2D, spectral Doppler, and color Doppler.  Birthdate:  Patient birthdate: 1932/05/24.  Age:  Patient is 84 yr old.  Sex:  Gender: male.    BMI: 25.1 kg/m^2.  Blood pressure:     158/80  Patient status:  Outpatient.  Study date:  Study date: 03/24/2018. Study time: 11:49 AM.  Location:  [facility]  -------------------------------------------------------------------  ------------------------------------------------------------------- Left ventricle:  The cavity size was normal. Wall thickness was increased in a pattern of mild LVH. Systolic function was normal. The estimated ejection fraction was in the range of 60% to 65%. Wall motion was normal; there were no regional wall motion abnormalities. The study is not technically sufficient to allow evaluation of LV diastolic function. Doppler parameters are consistent with high ventricular filling pressure.  ------------------------------------------------------------------- Aortic valve:   Trileaflet; severely thickened, severely calcified leaflets. Valve mobility was restricted.  Doppler:   There was severe stenosis.   There was mild to moderate regurgitation.    VTI ratio of LVOT to aortic valve: 0.34. Valve Rogers (VTI): 1.08 cm^2. Indexed valve Rogers (VTI): 0.53 cm^2/m^2. Peak velocity ratio of LVOT to aortic valve: 0.36. Valve Rogers (Vmax): 1.14 cm^2. Indexed valve Rogers (Vmax): 0.56 cm^2/m^2. Mean velocity ratio of LVOT to aortic valve: 0.31. Valve Rogers (Vmean): 0.97 cm^2. Indexed valve Rogers (Vmean): 0.48 cm^2/m^2.    Mean gradient (S): 45 mm Hg. Peak gradient (S): 68 mm Hg.  ------------------------------------------------------------------- Aorta:  Aortic root: The aortic root was normal in size. Ascending aorta: The ascending aorta was mildly dilated.  ------------------------------------------------------------------- Mitral valve:   Severely calcified annulus. Mobility was  not restricted.  Doppler:  Transvalvular velocity was within the normal range. There was no evidence for stenosis. There was mild regurgitation.    Peak gradient (D): 13 mm Hg.  ------------------------------------------------------------------- Left atrium:  The atrium was severely  dilated.  ------------------------------------------------------------------- Atrial septum:  No defect or patent foramen ovale was identified.   ------------------------------------------------------------------- Right ventricle:  The cavity size was normal. Wall thickness was normal. Systolic function was normal.  ------------------------------------------------------------------- Pulmonic valve:    Doppler:  Transvalvular velocity was within the normal range. There was no evidence for stenosis.  ------------------------------------------------------------------- Tricuspid valve:   Structurally normal valve.    Doppler: Transvalvular velocity was within the normal range. There was moderate regurgitation.  ------------------------------------------------------------------- Pulmonary artery:   The main pulmonary artery was normal-sized. Systolic pressure was moderately increased.  ------------------------------------------------------------------- Right atrium:  The atrium was moderately dilated.  ------------------------------------------------------------------- Pericardium:  There was no pericardial effusion.  ------------------------------------------------------------------- Systemic veins: Inferior vena cava: The vessel was dilated. The respirophasic diameter changes were blunted (< 50%), consistent with elevated central venous pressure.  ------------------------------------------------------------------- Measurements   Left ventricle                            Value          Reference  LV ID, ED, PLAX chordal                   45.8  mm       43 - 52  LV ID, ES, PLAX chordal                   26    mm       23 - 38  LV fx shortening, PLAX chordal            43    %        >=29  LV PW thickness, ED                       11.4  mm       ---------  IVS/LV PW ratio, ED                       0.96           <=1.3  Stroke volume, 2D                         96    ml        ---------  Stroke volume/bsa, 2D                     47    ml/m^2   ---------  LV e&', lateral                            7.79  cm/s     ---------  LV E/e&', lateral                          23.11          ---------  LV e&', medial                             6.47  cm/s     ---------  LV  E/e&', medial                           27.82          ---------  LV e&', average                            7.13  cm/s     ---------  LV E/e&', average                          25.25          ---------    Ventricular septum                        Value          Reference  IVS thickness, ED                         11    mm       ---------    LVOT                                      Value          Reference  LVOT ID, S                                20    mm       ---------  LVOT Rogers                                 3.14  cm^2     ---------  LVOT ID                                   20    mm       ---------  LVOT peak velocity, S                     151   cm/s     ---------  LVOT mean velocity, S                     99.4  cm/s     ---------  LVOT VTI, S                               30.6  cm       ---------  LVOT peak gradient, S                     9     mm Hg    ---------  Stroke volume (SV), LVOT DP               96.1  ml       ---------  Stroke index (SV/bsa), LVOT DP            47.4  ml/m^2   ---------    Aortic valve  Value          Reference  Aortic valve peak velocity, S             416   cm/s     ---------  Aortic valve mean velocity, S             322   cm/s     ---------  Aortic valve VTI, S                       88.7  cm       ---------  Aortic mean gradient, S                   45    mm Hg    ---------  Aortic peak gradient, S                   68    mm Hg    ---------  VTI ratio, LVOT/AV                        0.34           ---------  Aortic valve Rogers, VTI                    1.08  cm^2     ---------  Aortic valve Rogers/bsa, VTI                0.53   cm^2/m^2 ---------  Velocity ratio, peak, LVOT/AV             0.36           ---------  Aortic valve Rogers, peak velocity          1.14  cm^2     ---------  Aortic valve Rogers/bsa, peak               0.56  cm^2/m^2 ---------  velocity  Velocity ratio, mean, LVOT/AV             0.31           ---------  Aortic valve Rogers, mean velocity          0.97  cm^2     ---------  Aortic valve Rogers/bsa, mean               0.48  cm^2/m^2 ---------  velocity  Aortic regurg pressure half-time          585   ms       ---------    Aorta                                     Value          Reference  Aortic root ID, ED                        35    mm       ---------  Ascending aorta ID, A-P, S                42    mm       ---------    Left atrium  Value          Reference  LA ID, A-P, ES                            49    mm       ---------  LA ID/bsa, A-P                    (H)     2.41  cm/m^2   <=2.2  LA volume, S                              106   ml       ---------  LA volume/bsa, S                          52.2  ml/m^2   ---------  LA volume, ES, 1-p A4C                    81    ml       ---------  LA volume/bsa, ES, 1-p A4C                39.9  ml/m^2   ---------  LA volume, ES, 1-p A2C                    117   ml       ---------  LA volume/bsa, ES, 1-p A2C                57.6  ml/m^2   ---------    Mitral valve                              Value          Reference  Mitral E-wave peak velocity               180   cm/s     ---------  Mitral deceleration time          (H)     278   ms       150 - 230  Mitral peak gradient, D                   13    mm Hg    ---------    Pulmonary arteries                        Value          Reference  PA pressure, S, DP                (H)     51    mm Hg    <=30    Tricuspid valve                           Value          Reference  Tricuspid regurg peak velocity            300   cm/s     ---------  Tricuspid peak RV-RA gradient              36    mm Hg    ---------  Systemic veins                            Value          Reference  Estimated CVP                             15    mm Hg    ---------    Right ventricle                           Value          Reference  RV pressure, S, DP                (H)     51    mm Hg    <=30  RV s&', lateral, S                         16.7  cm/s     ---------  Legend: (L)  and  (H)  mark values outside specified reference range.  ------------------------------------------------------------------- Prepared and Electronically Authenticated by  Skeet Latch, MD 2019-06-21T17:03:44   Physicians   Panel Physicians Referring Physician Case Authorizing Physician  Burnell Blanks, MD (Primary)    Procedures   RIGHT/LEFT HEART CATH AND CORONARY ANGIOGRAPHY  Conclusion     Ost Ramus to Ramus lesion is 30% stenosed.  Prox Cx to Mid Cx lesion is 30% stenosed.  Ost LAD to Prox LAD lesion is 30% stenosed.  Prox LAD to Dist LAD lesion is 20% stenosed.  LV end diastolic pressure is normal.   1. Mild non-obstructive CAD 2. Severe aortic stenosis by echo criteria. By cath the peak to peak gradient across the valve is 7 mmHg and the mean gradient is 9.1 mmHg.   Will continue workup for TAVR.    Indications   Severe aortic stenosis [I35.0 (ICD-10-CM)]  Procedural Details/Technique   Technical Details Indication: 84 yo male with CHB s/p PPM, severe aortic stenosis here today for cardiac cath, workup for TAVR  Procedure: The risks, benefits, complications, treatment options, and expected outcomes were discussed with the patient. The patient and/or family concurred with the proposed plan, giving informed consent. The patient was brought to the cath lab after IV hydration was given. The patient was further sedated with Versed and Fentanyl. I changed out the IV catheter in the right antecubital vein for a 5 French sheath. Right heart cath performed with a balloon  tipped catheter and a multi-purpose catheter. Considerable difficulty manipulating the catheters through the venous system and out into the PA. No wedge pressure obtained. The right wrist was prepped and draped in a sterile fashion. 1% lidocaine was used for local anesthesia. Using the modified Seldinger access technique, a 5 French sheath was placed in the right radial artery. 3 mg Verapamil was given through the sheath. 4000 units IV heparin was given. Standard diagnostic catheters were used to perform selective coronary angiography. I crossed the aortic valve with an AL-1 catheter and a straight wire. LV pressures measured. The sheath was removed from the right radial artery and a Terumo hemostasis band was applied at the arteriotomy site on the right wrist.     Estimated blood loss <50 mL.  During this procedure the patient was administered the following to achieve and maintain moderate  conscious sedation: Versed 1 mg, Fentanyl 25 mcg, while the patient's heart rate, blood pressure, and oxygen saturation were continuously monitored. The period of conscious sedation was 60 minutes, of which I was present face-to-face 100% of this time.  Complications   Complications documented before study signed (05/03/2018 9:47 AM EDT)    RIGHT/LEFT HEART CATH AND CORONARY ANGIOGRAPHY   None Documented by Burnell Blanks, MD 05/03/2018 9:42 AM EDT  Time Range: Intraprocedure      Coronary Findings   Diagnostic  Dominance: Left  Left Anterior Descending  Vessel is large.  Ost LAD to Prox LAD lesion 30% stenosed  Ost LAD to Prox LAD lesion is 30% stenosed. The lesion is calcified.  Prox LAD to Dist LAD lesion 20% stenosed  Prox LAD to Dist LAD lesion is 20% stenosed.  Ramus Intermedius  Vessel is moderate in size.  Ost Ramus to Ramus lesion 30% stenosed  Ost Ramus to Ramus lesion is 30% stenosed.  Left Circumflex  Prox Cx to Mid Cx lesion 30% stenosed  Prox Cx to Mid Cx lesion is 30%  stenosed.  First Obtuse Marginal Branch  Vessel is large in size.  Right Coronary Artery  Vessel is moderate in size.  Intervention   No interventions have been documented.  Right Heart   Right Heart Pressures LV EDP is normal.  Coronary Diagrams   Diagnostic Diagram       Implants    No implant documentation for this case.  MERGE Images   Show images for CARDIAC CATHETERIZATION   Link to Procedure Log   Procedure Log    Hemo Data    Most Recent Value  Fick Cardiac Output 3.88 L/min  Fick Cardiac Output Index 1.93 (L/min)/BSA  Aortic Mean Gradient 9.05 mmHg  Aortic Peak Gradient 7 mmHg  Aortic Valve Rogers 2.04  Aortic Value Rogers Index 1.01 cm2/BSA  RA A Wave 4 mmHg  RA V Wave 6 mmHg  RA Mean 4 mmHg  RV Systolic Pressure 19 mmHg  RV Diastolic Pressure 2 mmHg  RV EDP 3 mmHg  PA Systolic Pressure 30 mmHg  PA Diastolic Pressure 11 mmHg  PA Mean 20 mmHg  AO Systolic Pressure 950 mmHg  AO Diastolic Pressure 62 mmHg  AO Mean 81 mmHg  LV Systolic Pressure 932 mmHg  LV Diastolic Pressure 5 mmHg  LV EDP 10 mmHg  AOp Systolic Pressure 671 mmHg  AOp Diastolic Pressure 70 mmHg  AOp Mean Pressure 92 mmHg  LVp Systolic Pressure 245 mmHg  LVp Diastolic Pressure 3 mmHg  LVp EDP Pressure 9 mmHg  QP/QS 1  TPVR Index 9.32 HRUI  TSVR Index 41.94 HRUI  TPVR/TSVR Ratio 0.22    CLINICAL DATA:  Severe symptomatic aortic stenosis. Pre-TAVR evaluation. Atrial fibrillation. Complete heart block status post permanent pacemaker placement.  EXAM: CT ANGIOGRAPHY CHEST, ABDOMEN AND PELVIS  TECHNIQUE: Multidetector CT imaging through the chest, abdomen and pelvis was performed using the standard protocol during bolus administration of intravenous contrast. Multiplanar reconstructed images and MIPs were obtained and reviewed to evaluate the vascular anatomy.  CONTRAST:  1108mL ISOVUE-370 IOPAMIDOL (ISOVUE-370) INJECTION 76%  COMPARISON:  02/12/2016 chest  radiograph.  FINDINGS: CTA CHEST FINDINGS  Cardiovascular: Mild cardiomegaly. No significant pericardial effusion/thickening. Left anterior descending and left circumflex coronary atherosclerosis. Severely thickened and coarsely calcified aortic valve. Two lead left subclavian pacemaker is noted with lead tips in the right atrium and right ventricular apex. Atherosclerotic nonaneurysmal thoracic aorta. Normal caliber pulmonary arteries. No central  pulmonary emboli.  Mediastinum/Nodes: Heterogeneous partially calcified 3.5 cm posterior right thyroid lobe nodule. Unremarkable esophagus. No pathologically enlarged axillary, mediastinal or hilar lymph nodes.  Lungs/Pleura: No pneumothorax. No pleural effusion. Subpleural right middle lobe solid pulmonary nodule with average diameter 5 mm associated with the minor fissure (series 15/image 54). No acute consolidative airspace disease, lung masses or additional significant pulmonary nodules.  Musculoskeletal: No aggressive appearing focal osseous lesions. Moderate thoracic spondylosis.  CTA ABDOMEN AND PELVIS FINDINGS  Hepatobiliary: Normal liver size. Hypodense 0.6 cm right liver dome lesion (series 14/image 81), for which no follow-up is required unless the patient has risk factors for liver malignancy. No additional liver lesions. Cholelithiasis. No biliary ductal dilatation.  Pancreas: Normal, with no mass or duct dilation.  Spleen: Normal size. No mass.  Adrenals/Urinary Tract: Normal adrenals. Simple 4.5 cm posterior lower right renal cyst. Simple 4.4 cm anterior interpolar left renal cyst. No hydronephrosis. Prominently distended urinary bladder (bladder dimensions 22.2 x 11.5 x 17.5 cm (volume = 2340 cm^3)).  Stomach/Bowel: Normal non-distended stomach. Normal caliber small bowel with no small bowel wall thickening. Normal appendix. Normal large bowel with no diverticulosis, large bowel wall thickening  or pericolonic fat stranding.  Vascular/Lymphatic: Atherosclerotic nonaneurysmal abdominal aorta. Patent renal veins. No pathologically enlarged lymph nodes in the abdomen or pelvis.  Reproductive: Mildly to moderately enlarged prostate.  Other: No pneumoperitoneum, ascites or focal fluid collection.  Musculoskeletal: No aggressive appearing focal osseous lesions. Marked lumbar spondylosis.  VASCULAR MEASUREMENTS PERTINENT TO TAVR:  AORTA:  Minimal Aortic Diameter-13.0 x 12.2 mm (infrarenal abdominal aorta on series 14/image 137)  Severity of Aortic Calcification-moderate to severe  RIGHT PELVIS:  Right Common Iliac Artery -  Minimal Diameter-10.5 x 9.9 mm  Tortuosity-moderate  Calcification-moderate  Right External Iliac Artery -  Minimal Diameter-7.9 x 7.5 mm  Tortuosity-moderate  Calcification-none  Right Common Femoral Artery -  Minimal Diameter-8.6 x 5.8 mm  Tortuosity-mild  Calcification-moderate  LEFT PELVIS:  Left Common Iliac Artery -  Minimal Diameter-11.2 x 8.8 mm  Tortuosity-moderate  Calcification-moderate  Left External Iliac Artery -  Minimal Diameter-8.1 x 7.7 mm  Tortuosity-mild  Calcification-none  Left Common Femoral Artery -  Minimal Diameter-9.1 x 7.4 mm  Tortuosity-mild  Calcification-moderate  Review of the MIP images confirms the above findings.  IMPRESSION: 1. Markedly distended urinary bladder, presumably due to chronic bladder outlet obstruction by the enlarged prostate. No hydronephrosis. Please ensure clinically that there is no acute urinary retention. 2. Vascular findings and measurements pertinent to potential TAVR procedure, as detailed above. 3. Severe thickening and calcification of the aortic valve, compatible with the reported clinical history of severe aortic stenosis. 4. Mild cardiomegaly. 5. Two-vessel coronary atherosclerosis. 6. Heterogeneous partially  calcified 3.5 cm posterior right thyroid lobe nodule. Thyroid ultrasound is indicated for further evaluation. This follows ACR consensus guidelines: Managing Incidental Thyroid Nodules Detected on Imaging: White Paper of the ACR Incidental Thyroid Findings Committee. J Am Coll Radiol 2015; 12:143-150. 7. Subpleural 5 mm right middle lobe solid pulmonary nodule. No follow-up needed if patient is low-risk. Non-contrast chest CT can be considered in 12 months if patient is high-risk. This recommendation follows the consensus statement: Guidelines for Management of Incidental Pulmonary Nodules Detected on CT Images:From the Fleischner Society 2017; published online before print (10.1148/radiol.[id number]). 8. Cholelithiasis. 9. Aortic Atherosclerosis (ICD10-I70.0).  These results will be called to the ordering clinician or representative by the Radiologist Assistant, and communication documented in the PACS or zVision Dashboard.   Electronically Signed  By: Ilona Sorrel M.D.   On: 05/16/2018 17:28   Impression:  The patient is an 84 year old gentleman with stage D, severe, symptomatic aortic stenosis with New York Heart Association class II symptoms of mild exertional shortness of breath and fatigue consistent with chronic diastolic congestive heart failure as well as occasional episodes of dizziness with standing.  The patient has mild dementia and memory loss and says that he feels fine but his wife reports that he has slowed down over the past year and is not near as active.  She said that he does complain of some exertional shortness of breath decreasing his activity level due to symptoms.  I have personally reviewed his echocardiogram, cardiac catheterization, and CTA studies. His echocardiogram shows a trileaflet aortic valve with severe thickening and calcification of the leaflets and restricted mobility.  The mean gradient has progressed to 45 mmHg consistent with severe aortic  stenosis.  There is mild to moderate aortic insufficiency.  Left ventricular ejection fraction is normal.  Cardiac catheterization shows mild nonobstructive coronary disease.  Right heart pressures were normal.  I agree that aortic valve replacement is indicated in this patient with severe aortic stenosis and mild to moderate aortic insufficiency who is becoming symptomatic.  I think transcatheter aortic valve replacement would be the best option given his advanced age and mild cognitive dysfunction.  His gated cardiac CTA has been done but not read.  His abdominal and pelvic CTA shows adequate pelvic vasculature to allow transfemoral insertion.  His chest CT scan also shows a multinodular thyroid goiter with some calcification.  He has a long history of this and has had multiple thyroid ultrasounds in the past without significant change.  There is also a 5 mm right middle lobe subpleural nodule.  He is a non-smoker and is at low risk for lung cancer and I do not think any further follow-up of this is indicated.  The patient and his wife were counseled at length regarding treatment alternatives for management of severe symptomatic aortic stenosis. The risks and benefits of surgical intervention has been discussed in detail. Long-term prognosis with medical therapy was discussed. Alternative approaches such as conventional surgical aortic valve replacement, transcatheter aortic valve replacement, and palliative medical therapy were compared and contrasted at length. This discussion was placed in the context of the patient's own specific clinical presentation and past medical history. All of their questions have been addressed.   Following the decision to proceed with transcatheter aortic valve replacement, a discussion was held regarding what types of management strategies would be attempted intraoperatively in the event of life-threatening complications, including whether or not the patient would be considered a  candidate for the use of cardiopulmonary bypass and/or conversion to open sternotomy for attempted surgical intervention.   The patient and his wife have been advised of a variety of complications that might develop including but not limited to risks of death, stroke, paravalvular leak, aortic dissection or other major vascular complications, aortic annulus rupture, device embolization, cardiac rupture or perforation, mitral regurgitation, acute myocardial infarction, arrhythmia, heart block or bradycardia requiring permanent pacemaker placement, congestive heart failure, respiratory failure, renal failure, pneumonia, infection, other late complications related to structural valve deterioration or migration, or other complications that might ultimately cause a temporary or permanent loss of functional independence or other long term morbidity. The patient and his wife provide full informed consent for the procedure as described and all questions were answered.      Plan:  I  will follow-up on his gated cardiac CTA as soon as it has been officially read.  We will plan to proceed with transfemoral transcatheter aortic valve replacement on 05/23/2018.   I spent 60 minutes performing this consultation and > 50% of this time was spent face to face counseling and coordinating the care of this patient's severe symptomatic aortic stenosis.  Gaye Pollack, MD 05/17/2018

## 2018-05-17 NOTE — H&P (View-Only) (Signed)
Patient ID: Jeffery Rogers, male   DOB: 1932-07-15, 84 y.o.   MRN: [id number]  [facility] REPORT  Referring Provider is Byington Lance, MD PCP is [name], MD  Chief Complaint  Patient presents with  . Aortic Stenosis    TAVR eval..after ECHO 6/21, CATH 7/31, PFT/CTA C/A/P/HEART, PT EVAL, CAROTID DUPLEX 05/16/18    HPI:  The patient is an 84 year old gentleman with history of hyperlipidemia, persistent atrial fibrillation on Eliquis, complete heart block status post permanent pacemaker in 2017, and aortic stenosis that has been followed by Dr. Lovena Le.  He had an echo in May 2017 which showed a trileaflet aortic valve with severe thickening and calcification and restricted leaflet mobility with a mean gradient of 34 mmHg consistent with moderate aortic stenosis.  He had a follow-up echocardiogram on 03/24/2018 which showed progression to severe aortic stenosis with a mean gradient of 45 mmHg and a peak gradient of 68 mmHg.  Left ventricular ejection fraction was 60 to 65%.  He is here with his wife today.  He says that he is always been very active and used to ride his road bike 60 to 75 miles per week but quit about 3 years ago at his family's insistent due to some memory loss.  He says that he is not as active as he used to be a year ago but denies any shortness of breath or fatigue with his usual daily activity.  His wife said that he does complain of some shortness of breath when he exerts himself and has been tired towards the end of the day.  He denies any chest pain or pressure.  He has had no lower extremity edema.  He said that he has had some dizziness when standing but no syncope.  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 He has a son who is a Animal nutritionist.   Past Medical History:  Diagnosis Date  . Arthritis    "right arm/shoulder" (02/10/2016)  . Complete heart  block ([place]) 02/10/2016   a. s/p Emergency planning/management officer  . Goiter   . Hypercholesteremia   . Memory loss   . Nasal septal deviation   . Persistent atrial fibrillation ([place])   . Severe aortic stenosis   . Tremor     Past Surgical History:  Procedure Laterality Date  . CATARACT EXTRACTION W/ INTRAOCULAR LENS  IMPLANT, BILATERAL Bilateral ~ 2000  . EP IMPLANTABLE DEVICE N/A 02/11/2016   Procedure: Pacemaker Implant;  Surgeon: Delpriore Lance, MD;  Location: [facility];  Service: Cardiovascular;  Laterality: N/A;  . RIGHT/LEFT HEART CATH AND CORONARY ANGIOGRAPHY N/A 05/03/2018   Procedure: RIGHT/LEFT HEART CATH AND CORONARY ANGIOGRAPHY;  Surgeon: Burnell Blanks, MD;  Location: [facility];  Service: Cardiovascular;  Laterality: N/A;  . TONSILLECTOMY  1930s  . WISDOM TOOTH EXTRACTION  1990s    Family History  Problem Relation Age of Onset  . Stroke Unknown   . Irregular heart beat Unknown   . Stroke Mother   . Stroke Father   . CAD Father        MI    Social History   Socioeconomic History  . Marital status: Married    Spouse name: Not on file  . Number of children: 2  . Years of education: Bachelors  . Highest education level: Not on file  Occupational History  . Occupation: Retired  Engineer  Social Needs  . Financial resource strain: Not on file  . Food insecurity:    Worry: Not on file    Inability: Not on file  . Transportation needs:    Medical: Not on file    Non-medical: Not on file  Tobacco Use  . Smoking status: Former Smoker    Packs/day: 1.00    Years: 16.00    Pack years: 16.00    Types: Cigarettes  . Smokeless tobacco: Never Used  . Tobacco comment: Quit smoking cigarettes in 1971  Substance and Sexual Activity  . Alcohol use: Never    Alcohol/week: 2.0 standard drinks    Types: 1 Cans of beer, 1 Shots of liquor per week    Frequency: Never  . Drug use: No  . Sexual activity: Yes  Lifestyle  . Physical activity:    Days per  week: Not on file    Minutes per session: Not on file  . Stress: Not on file  Relationships  . Social connections:    Talks on phone: Not on file    Gets together: Not on file    Attends religious service: Not on file    Active member of club or organization: Not on file    Attends meetings of clubs or organizations: Not on file    Relationship status: Not on file  . Intimate partner violence:    Fear of current or ex partner: Not on file    Emotionally abused: Not on file    Physically abused: Not on file    Forced sexual activity: Not on file  Other Topics Concern  . Not on file  Social History Narrative   Lives at home with his wife.   Right-handed.   1 cup coffee per day.  Occasional Coke.    Current Outpatient Medications  Medication Sig Dispense Refill  . apixaban (ELIQUIS) 5 MG TABS tablet Take 1 tablet (5 mg total) by mouth 2 (two) times daily. 180 tablet 3  . atorvastatin (LIPITOR) 40 MG tablet Take 1 tablet (40 mg total) by mouth daily. 90 tablet 2  . Cholecalciferol (VITAMIN D-3) 1000 units CAPS Take 1,000 Units by mouth daily.     Marland Kitchen donepezil (ARICEPT) 10 MG tablet Take 1 tablet (10 mg total) by mouth at bedtime. 90 tablet 4  . ipratropium (ATROVENT) 0.06 % nasal spray Place 1-2 sprays into both nostrils 4 (four) times daily as needed for rhinitis.    Marland Kitchen L-Methylfolate-Algae-B12-B6 (METANX) 3-90.314-2-35 MG CAPS Take 1 capsule by mouth 2 (two) times daily. 180 capsule 4  . memantine (NAMENDA) 10 MG tablet Take 1 tablet (10 mg total) by mouth 2 (two) times daily. 180 tablet 4  . QUEtiapine (SEROQUEL) 25 MG tablet Take 1 tablet (25 mg total) by mouth at bedtime. 90 tablet 3  . sertraline (ZOLOFT) 50 MG tablet Take 50 mg by mouth daily.     . vitamin C (ASCORBIC ACID) 500 MG tablet Take 500 mg by mouth daily.     No current facility-administered medications for this visit.     No Known Allergies    Review of Systems:   General:  normal appetite, decreased energy,  no weight gain, no weight loss, no fever  Cardiac:  no chest pain with exertion, no chest pain at rest, + SOB with exertion according to wife, no resting SOB, no PND, no orthopnea, no palpitations, + arrhythmia, + atrial fibrillation, no LE edema, + dizzy spells, no syncope  Respiratory:  Some exertional shortness of breath, no home oxygen, no productive cough, no dry cough, no bronchitis, no wheezing, no hemoptysis, no asthma, no pain with inspiration or cough, no sleep apnea, no CPAP at night  GI:   no difficulty swallowing, no reflux, no frequent heartburn, no hiatal hernia, no abdominal pain, no constipation, no diarrhea, no hematochezia, no hematemesis, no melena  GU:   no dysuria,  no frequency, no urinary tract infection, no hematuria, + enlarged prostate, no kidney stones, no kidney disease  Vascular:  no pain suggestive of claudication, no pain in feet, no leg cramps, no varicose veins, no DVT, no non-healing foot ulcer  Neuro:   no stroke, no TIA's, no seizures, no headaches, no temporary blindness one eye,  no slurred speech, + peripheral neuropathy, no chronic pain, no instability of gait, + memory/cognitive dysfunction  Musculoskeletal: no arthritis, no joint swelling, no myalgias, no difficulty walking, normal mobility   Skin:   no rash, no itching, no skin infections, no pressure sores or ulcerations  Psych:   no anxiety, no depression, no nervousness, no unusual recent stress  Eyes:   no blurry vision, no floaters, no recent vision changes, does not wear glasses or contacts  ENT:   + hearing loss, mp loose or painful teeth, mp dentures, last saw dentist every six months.  Hematologic:  no easy bruising, no abnormal bleeding, no clotting disorder, no frequent epistaxis  Endocrine:  no diabetes, does not check CBG's at home       Physical Exam:   Ht 5\' 11"  (1.803 m)   BMI 24.41 kg/m   General:  Elderly,  well-appearing  HEENT:  Unremarkable, NCAT, PERLA, EOMI, oropharynx clear,  teeth in good condition  Neck:   no JVD, no bruits, no adenopathy or thyromegaly  Chest:   clear to auscultation, symmetrical breath sounds, no wheezes, no rhonchi   CV:   RRR, grade III/VI crescendo/decrescendo murmur heard best at RSB,  no diastolic murmur  Abdomen:  soft, non-tender, no masses or organomegaly  Extremities:  warm, well-perfused, pulses palpable in feet, no LE edema  Rectal/GU  Deferred  Neuro:   Grossly non-focal and symmetrical throughout  Skin:   Clean and dry, no rashes, no breakdown   Diagnostic Tests:       [facility]                        [address], [place] [address]                            [phone]  ------------------------------------------------------------------- Echocardiography  Patient:    Cashel, Bellina MR #:       [id number] Study Date: 03/24/2018 Gender:     M Age:        64 Height:     180.3 cm Weight:     81.6 kg BSA:        2.03 m^2 Pt. Status: Room:   ATTENDING    Cristopher Peru, MD  ORDERING     Cristopher Peru, MD  Anton Ruiz, MD  SONOGRAPHER  Cindy Hazy, RDCS  PERFORMING   [facility], Outpatient  cc:  ------------------------------------------------------------------- LV EF: 60% -   65%  ------------------------------------------------------------------- Indications:  I35.0 Aortic Valve Stenosis.  ------------------------------------------------------------------- History:   PMH:  Acquired from the patient and from the patient&'s chart.  PMH:  Aortic insufficiency. Murmur.  Risk factors: Hypercholesterolemia.  ------------------------------------------------------------------- Study Conclusions  - Left ventricle: The cavity size was normal. Wall thickness was   increased in a pattern of mild LVH. Systolic function was normal.   The estimated ejection fraction was in the range of 60% to 65%.   Wall motion was normal; there were no regional  wall motion   abnormalities. The study is not technically sufficient to allow   evaluation of LV diastolic function. Doppler parameters are   consistent with high ventricular filling pressure. - Aortic valve: Valve mobility was restricted. There was severe   stenosis. There was mild to moderate regurgitation. Peak velocity   (S): 416 cm/s. Mean gradient (S): 45 mm Hg. Regurgitation   pressure half-time: 585 ms. - Aorta: Ascending aortic diameter: 42 mm (S). - Ascending aorta: The ascending aorta was mildly dilated. - Mitral valve: Severely calcified annulus. Transvalvular velocity   was within the normal range. There was no evidence for stenosis.   There was mild regurgitation. - Left atrium: The atrium was severely dilated. - Right ventricle: The cavity size was normal. Wall thickness was   normal. Systolic function was normal. - Right atrium: The atrium was moderately dilated. - Atrial septum: No defect or patent foramen ovale was identified. - Tricuspid valve: There was moderate regurgitation. - Pulmonary arteries: Systolic pressure was moderately increased.   PA peak pressure: 51 mm Hg (S).  ------------------------------------------------------------------- Labs, prior tests, procedures, and surgery: Pacemaker.  ------------------------------------------------------------------- Study data:   Study status:  Routine.  Procedure:  The patient reported no pain pre or post test. Transthoracic echocardiography. Image quality was adequate.  Study completion:  There were no complications.          Echocardiography.  M-mode, complete 2D, spectral Doppler, and color Doppler.  Birthdate:  Patient birthdate: April 11, 1932.  Age:  Patient is 84 yr old.  Sex:  Gender: male.    BMI: 25.1 kg/m^2.  Blood pressure:     158/80  Patient status:  Outpatient.  Study date:  Study date: 03/24/2018. Study time: 11:49 AM.  Location:  [facility]  -------------------------------------------------------------------  ------------------------------------------------------------------- Left ventricle:  The cavity size was normal. Wall thickness was increased in a pattern of mild LVH. Systolic function was normal. The estimated ejection fraction was in the range of 60% to 65%. Wall motion was normal; there were no regional wall motion abnormalities. The study is not technically sufficient to allow evaluation of LV diastolic function. Doppler parameters are consistent with high ventricular filling pressure.  ------------------------------------------------------------------- Aortic valve:   Trileaflet; severely thickened, severely calcified leaflets. Valve mobility was restricted.  Doppler:   There was severe stenosis.   There was mild to moderate regurgitation.    VTI ratio of LVOT to aortic valve: 0.34. Valve Rogers (VTI): 1.08 cm^2. Indexed valve Rogers (VTI): 0.53 cm^2/m^2. Peak velocity ratio of LVOT to aortic valve: 0.36. Valve Rogers (Vmax): 1.14 cm^2. Indexed valve Rogers (Vmax): 0.56 cm^2/m^2. Mean velocity ratio of LVOT to aortic valve: 0.31. Valve Rogers (Vmean): 0.97 cm^2. Indexed valve Rogers (Vmean): 0.48 cm^2/m^2.    Mean gradient (S): 45 mm Hg. Peak gradient (S): 68 mm Hg.  ------------------------------------------------------------------- Aorta:  Aortic root: The aortic root was normal in size. Ascending aorta: The ascending aorta was mildly dilated.  ------------------------------------------------------------------- Mitral valve:   Severely calcified annulus. Mobility was  not restricted.  Doppler:  Transvalvular velocity was within the normal range. There was no evidence for stenosis. There was mild regurgitation.    Peak gradient (D): 13 mm Hg.  ------------------------------------------------------------------- Left atrium:  The atrium was severely  dilated.  ------------------------------------------------------------------- Atrial septum:  No defect or patent foramen ovale was identified.   ------------------------------------------------------------------- Right ventricle:  The cavity size was normal. Wall thickness was normal. Systolic function was normal.  ------------------------------------------------------------------- Pulmonic valve:    Doppler:  Transvalvular velocity was within the normal range. There was no evidence for stenosis.  ------------------------------------------------------------------- Tricuspid valve:   Structurally normal valve.    Doppler: Transvalvular velocity was within the normal range. There was moderate regurgitation.  ------------------------------------------------------------------- Pulmonary artery:   The main pulmonary artery was normal-sized. Systolic pressure was moderately increased.  ------------------------------------------------------------------- Right atrium:  The atrium was moderately dilated.  ------------------------------------------------------------------- Pericardium:  There was no pericardial effusion.  ------------------------------------------------------------------- Systemic veins: Inferior vena cava: The vessel was dilated. The respirophasic diameter changes were blunted (< 50%), consistent with elevated central venous pressure.  ------------------------------------------------------------------- Measurements   Left ventricle                            Value          Reference  LV ID, ED, PLAX chordal                   45.8  mm       43 - 52  LV ID, ES, PLAX chordal                   26    mm       23 - 38  LV fx shortening, PLAX chordal            43    %        >=29  LV PW thickness, ED                       11.4  mm       ---------  IVS/LV PW ratio, ED                       0.96           <=1.3  Stroke volume, 2D                         96    ml        ---------  Stroke volume/bsa, 2D                     47    ml/m^2   ---------  LV e&', lateral                            7.79  cm/s     ---------  LV E/e&', lateral                          23.11          ---------  LV e&', medial                             6.47  cm/s     ---------  LV  E/e&', medial                           27.82          ---------  LV e&', average                            7.13  cm/s     ---------  LV E/e&', average                          25.25          ---------    Ventricular septum                        Value          Reference  IVS thickness, ED                         11    mm       ---------    LVOT                                      Value          Reference  LVOT ID, S                                20    mm       ---------  LVOT Rogers                                 3.14  cm^2     ---------  LVOT ID                                   20    mm       ---------  LVOT peak velocity, S                     151   cm/s     ---------  LVOT mean velocity, S                     99.4  cm/s     ---------  LVOT VTI, S                               30.6  cm       ---------  LVOT peak gradient, S                     9     mm Hg    ---------  Stroke volume (SV), LVOT DP               96.1  ml       ---------  Stroke index (SV/bsa), LVOT DP            47.4  ml/m^2   ---------    Aortic valve  Value          Reference  Aortic valve peak velocity, S             416   cm/s     ---------  Aortic valve mean velocity, S             322   cm/s     ---------  Aortic valve VTI, S                       88.7  cm       ---------  Aortic mean gradient, S                   45    mm Hg    ---------  Aortic peak gradient, S                   68    mm Hg    ---------  VTI ratio, LVOT/AV                        0.34           ---------  Aortic valve Rogers, VTI                    1.08  cm^2     ---------  Aortic valve Rogers/bsa, VTI                0.53   cm^2/m^2 ---------  Velocity ratio, peak, LVOT/AV             0.36           ---------  Aortic valve Rogers, peak velocity          1.14  cm^2     ---------  Aortic valve Rogers/bsa, peak               0.56  cm^2/m^2 ---------  velocity  Velocity ratio, mean, LVOT/AV             0.31           ---------  Aortic valve Rogers, mean velocity          0.97  cm^2     ---------  Aortic valve Rogers/bsa, mean               0.48  cm^2/m^2 ---------  velocity  Aortic regurg pressure half-time          585   ms       ---------    Aorta                                     Value          Reference  Aortic root ID, ED                        35    mm       ---------  Ascending aorta ID, A-P, S                42    mm       ---------    Left atrium  Value          Reference  LA ID, A-P, ES                            49    mm       ---------  LA ID/bsa, A-P                    (H)     2.41  cm/m^2   <=2.2  LA volume, S                              106   ml       ---------  LA volume/bsa, S                          52.2  ml/m^2   ---------  LA volume, ES, 1-p A4C                    81    ml       ---------  LA volume/bsa, ES, 1-p A4C                39.9  ml/m^2   ---------  LA volume, ES, 1-p A2C                    117   ml       ---------  LA volume/bsa, ES, 1-p A2C                57.6  ml/m^2   ---------    Mitral valve                              Value          Reference  Mitral E-wave peak velocity               180   cm/s     ---------  Mitral deceleration time          (H)     278   ms       150 - 230  Mitral peak gradient, D                   13    mm Hg    ---------    Pulmonary arteries                        Value          Reference  PA pressure, S, DP                (H)     51    mm Hg    <=30    Tricuspid valve                           Value          Reference  Tricuspid regurg peak velocity            300   cm/s     ---------  Tricuspid peak RV-RA gradient              36    mm Hg    ---------  Systemic veins                            Value          Reference  Estimated CVP                             15    mm Hg    ---------    Right ventricle                           Value          Reference  RV pressure, S, DP                (H)     51    mm Hg    <=30  RV s&', lateral, S                         16.7  cm/s     ---------  Legend: (L)  and  (H)  mark values outside specified reference range.  ------------------------------------------------------------------- Prepared and Electronically Authenticated by  Skeet Latch, MD 2019-06-21T17:03:44   Physicians   Panel Physicians Referring Physician Case Authorizing Physician  Burnell Blanks, MD (Primary)    Procedures   RIGHT/LEFT HEART CATH AND CORONARY ANGIOGRAPHY  Conclusion     Ost Ramus to Ramus lesion is 30% stenosed.  Prox Cx to Mid Cx lesion is 30% stenosed.  Ost LAD to Prox LAD lesion is 30% stenosed.  Prox LAD to Dist LAD lesion is 20% stenosed.  LV end diastolic pressure is normal.   1. Mild non-obstructive CAD 2. Severe aortic stenosis by echo criteria. By cath the peak to peak gradient across the valve is 7 mmHg and the mean gradient is 9.1 mmHg.   Will continue workup for TAVR.    Indications   Severe aortic stenosis [I35.0 (ICD-10-CM)]  Procedural Details/Technique   Technical Details Indication: 84 yo male with CHB s/p PPM, severe aortic stenosis here today for cardiac cath, workup for TAVR  Procedure: The risks, benefits, complications, treatment options, and expected outcomes were discussed with the patient. The patient and/or family concurred with the proposed plan, giving informed consent. The patient was brought to the cath lab after IV hydration was given. The patient was further sedated with Versed and Fentanyl. I changed out the IV catheter in the right antecubital vein for a 5 French sheath. Right heart cath performed with a balloon  tipped catheter and a multi-purpose catheter. Considerable difficulty manipulating the catheters through the venous system and out into the PA. No wedge pressure obtained. The right wrist was prepped and draped in a sterile fashion. 1% lidocaine was used for local anesthesia. Using the modified Seldinger access technique, a 5 French sheath was placed in the right radial artery. 3 mg Verapamil was given through the sheath. 4000 units IV heparin was given. Standard diagnostic catheters were used to perform selective coronary angiography. I crossed the aortic valve with an AL-1 catheter and a straight wire. LV pressures measured. The sheath was removed from the right radial artery and a Terumo hemostasis band was applied at the arteriotomy site on the right wrist.     Estimated blood loss <50 mL.  During this procedure the patient was administered the following to achieve and maintain moderate  conscious sedation: Versed 1 mg, Fentanyl 25 mcg, while the patient's heart rate, blood pressure, and oxygen saturation were continuously monitored. The period of conscious sedation was 60 minutes, of which I was present face-to-face 100% of this time.  Complications   Complications documented before study signed (05/03/2018 9:47 AM EDT)    RIGHT/LEFT HEART CATH AND CORONARY ANGIOGRAPHY   None Documented by Burnell Blanks, MD 05/03/2018 9:42 AM EDT  Time Range: Intraprocedure      Coronary Findings   Diagnostic  Dominance: Left  Left Anterior Descending  Vessel is large.  Ost LAD to Prox LAD lesion 30% stenosed  Ost LAD to Prox LAD lesion is 30% stenosed. The lesion is calcified.  Prox LAD to Dist LAD lesion 20% stenosed  Prox LAD to Dist LAD lesion is 20% stenosed.  Ramus Intermedius  Vessel is moderate in size.  Ost Ramus to Ramus lesion 30% stenosed  Ost Ramus to Ramus lesion is 30% stenosed.  Left Circumflex  Prox Cx to Mid Cx lesion 30% stenosed  Prox Cx to Mid Cx lesion is 30%  stenosed.  First Obtuse Marginal Branch  Vessel is large in size.  Right Coronary Artery  Vessel is moderate in size.  Intervention   No interventions have been documented.  Right Heart   Right Heart Pressures LV EDP is normal.  Coronary Diagrams   Diagnostic Diagram       Implants    No implant documentation for this case.  MERGE Images   Show images for CARDIAC CATHETERIZATION   Link to Procedure Log   Procedure Log    Hemo Data    Most Recent Value  Fick Cardiac Output 3.88 L/min  Fick Cardiac Output Index 1.93 (L/min)/BSA  Aortic Mean Gradient 9.05 mmHg  Aortic Peak Gradient 7 mmHg  Aortic Valve Rogers 2.04  Aortic Value Rogers Index 1.01 cm2/BSA  RA A Wave 4 mmHg  RA V Wave 6 mmHg  RA Mean 4 mmHg  RV Systolic Pressure 19 mmHg  RV Diastolic Pressure 2 mmHg  RV EDP 3 mmHg  PA Systolic Pressure 30 mmHg  PA Diastolic Pressure 11 mmHg  PA Mean 20 mmHg  AO Systolic Pressure 024 mmHg  AO Diastolic Pressure 62 mmHg  AO Mean 81 mmHg  LV Systolic Pressure 097 mmHg  LV Diastolic Pressure 5 mmHg  LV EDP 10 mmHg  AOp Systolic Pressure 353 mmHg  AOp Diastolic Pressure 70 mmHg  AOp Mean Pressure 92 mmHg  LVp Systolic Pressure 299 mmHg  LVp Diastolic Pressure 3 mmHg  LVp EDP Pressure 9 mmHg  QP/QS 1  TPVR Index 9.32 HRUI  TSVR Index 41.94 HRUI  TPVR/TSVR Ratio 0.22    CLINICAL DATA:  Severe symptomatic aortic stenosis. Pre-TAVR evaluation. Atrial fibrillation. Complete heart block status post permanent pacemaker placement.  EXAM: CT ANGIOGRAPHY CHEST, ABDOMEN AND PELVIS  TECHNIQUE: Multidetector CT imaging through the chest, abdomen and pelvis was performed using the standard protocol during bolus administration of intravenous contrast. Multiplanar reconstructed images and MIPs were obtained and reviewed to evaluate the vascular anatomy.  CONTRAST:  142mL ISOVUE-370 IOPAMIDOL (ISOVUE-370) INJECTION 76%  COMPARISON:  02/12/2016 chest  radiograph.  FINDINGS: CTA CHEST FINDINGS  Cardiovascular: Mild cardiomegaly. No significant pericardial effusion/thickening. Left anterior descending and left circumflex coronary atherosclerosis. Severely thickened and coarsely calcified aortic valve. Two lead left subclavian pacemaker is noted with lead tips in the right atrium and right ventricular apex. Atherosclerotic nonaneurysmal thoracic aorta. Normal caliber pulmonary arteries. No central  pulmonary emboli.  Mediastinum/Nodes: Heterogeneous partially calcified 3.5 cm posterior right thyroid lobe nodule. Unremarkable esophagus. No pathologically enlarged axillary, mediastinal or hilar lymph nodes.  Lungs/Pleura: No pneumothorax. No pleural effusion. Subpleural right middle lobe solid pulmonary nodule with average diameter 5 mm associated with the minor fissure (series 15/image 54). No acute consolidative airspace disease, lung masses or additional significant pulmonary nodules.  Musculoskeletal: No aggressive appearing focal osseous lesions. Moderate thoracic spondylosis.  CTA ABDOMEN AND PELVIS FINDINGS  Hepatobiliary: Normal liver size. Hypodense 0.6 cm right liver dome lesion (series 14/image 81), for which no follow-up is required unless the patient has risk factors for liver malignancy. No additional liver lesions. Cholelithiasis. No biliary ductal dilatation.  Pancreas: Normal, with no mass or duct dilation.  Spleen: Normal size. No mass.  Adrenals/Urinary Tract: Normal adrenals. Simple 4.5 cm posterior lower right renal cyst. Simple 4.4 cm anterior interpolar left renal cyst. No hydronephrosis. Prominently distended urinary bladder (bladder dimensions 22.2 x 11.5 x 17.5 cm (volume = 2340 cm^3)).  Stomach/Bowel: Normal non-distended stomach. Normal caliber small bowel with no small bowel wall thickening. Normal appendix. Normal large bowel with no diverticulosis, large bowel wall thickening  or pericolonic fat stranding.  Vascular/Lymphatic: Atherosclerotic nonaneurysmal abdominal aorta. Patent renal veins. No pathologically enlarged lymph nodes in the abdomen or pelvis.  Reproductive: Mildly to moderately enlarged prostate.  Other: No pneumoperitoneum, ascites or focal fluid collection.  Musculoskeletal: No aggressive appearing focal osseous lesions. Marked lumbar spondylosis.  VASCULAR MEASUREMENTS PERTINENT TO TAVR:  AORTA:  Minimal Aortic Diameter-13.0 x 12.2 mm (infrarenal abdominal aorta on series 14/image 137)  Severity of Aortic Calcification-moderate to severe  RIGHT PELVIS:  Right Common Iliac Artery -  Minimal Diameter-10.5 x 9.9 mm  Tortuosity-moderate  Calcification-moderate  Right External Iliac Artery -  Minimal Diameter-7.9 x 7.5 mm  Tortuosity-moderate  Calcification-none  Right Common Femoral Artery -  Minimal Diameter-8.6 x 5.8 mm  Tortuosity-mild  Calcification-moderate  LEFT PELVIS:  Left Common Iliac Artery -  Minimal Diameter-11.2 x 8.8 mm  Tortuosity-moderate  Calcification-moderate  Left External Iliac Artery -  Minimal Diameter-8.1 x 7.7 mm  Tortuosity-mild  Calcification-none  Left Common Femoral Artery -  Minimal Diameter-9.1 x 7.4 mm  Tortuosity-mild  Calcification-moderate  Review of the MIP images confirms the above findings.  IMPRESSION: 1. Markedly distended urinary bladder, presumably due to chronic bladder outlet obstruction by the enlarged prostate. No hydronephrosis. Please ensure clinically that there is no acute urinary retention. 2. Vascular findings and measurements pertinent to potential TAVR procedure, as detailed above. 3. Severe thickening and calcification of the aortic valve, compatible with the reported clinical history of severe aortic stenosis. 4. Mild cardiomegaly. 5. Two-vessel coronary atherosclerosis. 6. Heterogeneous partially  calcified 3.5 cm posterior right thyroid lobe nodule. Thyroid ultrasound is indicated for further evaluation. This follows ACR consensus guidelines: Managing Incidental Thyroid Nodules Detected on Imaging: White Paper of the ACR Incidental Thyroid Findings Committee. J Am Coll Radiol 2015; 12:143-150. 7. Subpleural 5 mm right middle lobe solid pulmonary nodule. No follow-up needed if patient is low-risk. Non-contrast chest CT can be considered in 12 months if patient is high-risk. This recommendation follows the consensus statement: Guidelines for Management of Incidental Pulmonary Nodules Detected on CT Images:From the Fleischner Society 2017; published online before print (10.1148/radiol.[id number]). 8. Cholelithiasis. 9. Aortic Atherosclerosis (ICD10-I70.0).  These results will be called to the ordering clinician or representative by the Radiologist Assistant, and communication documented in the PACS or zVision Dashboard.   Electronically Signed  By: Ilona Sorrel M.D.   On: 05/16/2018 17:28   Impression:  The patient is an 84 year old gentleman with stage D, severe, symptomatic aortic stenosis with New York Heart Association class II symptoms of mild exertional shortness of breath and fatigue consistent with chronic diastolic congestive heart failure as well as occasional episodes of dizziness with standing.  The patient has mild dementia and memory loss and says that he feels fine but his wife reports that he has slowed down over the past year and is not near as active.  She said that he does complain of some exertional shortness of breath decreasing his activity level due to symptoms.  I have personally reviewed his echocardiogram, cardiac catheterization, and CTA studies. His echocardiogram shows a trileaflet aortic valve with severe thickening and calcification of the leaflets and restricted mobility.  The mean gradient has progressed to 45 mmHg consistent with severe aortic  stenosis.  There is mild to moderate aortic insufficiency.  Left ventricular ejection fraction is normal.  Cardiac catheterization shows mild nonobstructive coronary disease.  Right heart pressures were normal.  I agree that aortic valve replacement is indicated in this patient with severe aortic stenosis and mild to moderate aortic insufficiency who is becoming symptomatic.  I think transcatheter aortic valve replacement would be the best option given his advanced age and mild cognitive dysfunction.  His gated cardiac CTA has been done but not read.  His abdominal and pelvic CTA shows adequate pelvic vasculature to allow transfemoral insertion.  His chest CT scan also shows a multinodular thyroid goiter with some calcification.  He has a long history of this and has had multiple thyroid ultrasounds in the past without significant change.  There is also a 5 mm right middle lobe subpleural nodule.  He is a non-smoker and is at low risk for lung cancer and I do not think any further follow-up of this is indicated.  The patient and his wife were counseled at length regarding treatment alternatives for management of severe symptomatic aortic stenosis. The risks and benefits of surgical intervention has been discussed in detail. Long-term prognosis with medical therapy was discussed. Alternative approaches such as conventional surgical aortic valve replacement, transcatheter aortic valve replacement, and palliative medical therapy were compared and contrasted at length. This discussion was placed in the context of the patient's own specific clinical presentation and past medical history. All of their questions have been addressed.   Following the decision to proceed with transcatheter aortic valve replacement, a discussion was held regarding what types of management strategies would be attempted intraoperatively in the event of life-threatening complications, including whether or not the patient would be considered a  candidate for the use of cardiopulmonary bypass and/or conversion to open sternotomy for attempted surgical intervention.   The patient and his wife have been advised of a variety of complications that might develop including but not limited to risks of death, stroke, paravalvular leak, aortic dissection or other major vascular complications, aortic annulus rupture, device embolization, cardiac rupture or perforation, mitral regurgitation, acute myocardial infarction, arrhythmia, heart block or bradycardia requiring permanent pacemaker placement, congestive heart failure, respiratory failure, renal failure, pneumonia, infection, other late complications related to structural valve deterioration or migration, or other complications that might ultimately cause a temporary or permanent loss of functional independence or other long term morbidity. The patient and his wife provide full informed consent for the procedure as described and all questions were answered.      Plan:  I  will follow-up on his gated cardiac CTA as soon as it has been officially read.  We will plan to proceed with transfemoral transcatheter aortic valve replacement on 05/23/2018.   I spent 60 minutes performing this consultation and > 50% of this time was spent face to face counseling and coordinating the care of this patient's severe symptomatic aortic stenosis.  Gaye Pollack, MD 05/17/2018

## 2018-05-19 ENCOUNTER — Encounter (HOSPITAL_COMMUNITY): Payer: Self-pay

## 2018-05-19 NOTE — Pre-Procedure Instructions (Signed)
Drevin Jari Pigg  05/19/2018      [place], [place] [address] Phone: [phone] Fax: [place], [address] [place] [address] Phone: [phone] Fax: [phone]    Your procedure is scheduled on 05/23/18.  Report to [facility] Admitting at 1030 A.M.  Call this number if you have problems the morning of surgery:  [phone]   Remember:  Do not eat or drink after midnight.  You   Take these medicines the morning of surgery with A SIP OF WATER --ARICEPT,ZOLOFT    Do not wear jewelry, make-up or nail polish.  Do not wear lotions, powders, or perfumes, or deodorant.  Do not shave 48 hours prior to surgery.  Men may shave face and neck.  Do not bring valuables to the hospital.  [facility] is not responsible for any belongings or valuables.  Contacts, dentures or bridgework may not be worn into surgery.  Leave your suitcase in the car.  After surgery it may be brought to your room.  For patients admitted to the hospital, discharge time will be determined by your treatment team.  Patients discharged the day of surgery will not be allowed to drive home.   Name and phone number of your driver:   *Do not take any aspirin,anti-inflammatories,vitamins,or herbal supplements 5-7 days prior to surgery. Special instructions: [facility] - Preparing for Surgery  Before surgery, you can play an important role.  Because skin is not sterile, your skin needs to be as free of germs as possible.  You can reduce the number of germs on you skin by washing with CHG (chlorahexidine gluconate) soap before surgery.  CHG is an antiseptic cleaner which kills germs and bonds with the skin to continue killing germs even after washing.  Oral Hygiene is also important in reducing the risk of infection.  Remember to brush your teeth  with your regular toothpaste the morning of surgery.  Please DO NOT use if you have an allergy to CHG or antibacterial soaps.  If your skin becomes reddened/irritated stop using the CHG and inform your nurse when you arrive at Short Stay.  Do not shave (including legs and underarms) for at least 48 hours prior to the first CHG shower.  You may shave your face.  Please follow these instructions carefully:   1.  Shower with CHG Soap the night before surgery and the morning of Surgery.  2.  If you choose to wash your hair, wash your hair first as usual with your normal shampoo.  3.  After you shampoo, rinse your hair and body thoroughly to remove the shampoo. 4.  Use CHG as you would any other liquid soap.  You can apply chg directly to the skin and wash gently with a      scrungie or washcloth.           5.  Apply the CHG Soap to your body ONLY FROM THE NECK DOWN.   Do not use on open wounds or open sores. Avoid contact with your eyes, ears, mouth and genitals (private parts).  Wash genitals (private parts) with your normal soap.  6.  Wash thoroughly, paying special attention to the area where your surgery will be performed.  7.  Thoroughly rinse your body with warm water from the neck down.  8.  DO  NOT shower/wash with your normal soap after using and rinsing off the CHG Soap.  9.  Pat yourself dry with a clean towel.            10.  Wear clean pajamas.            11.  Place clean sheets on your bed the night of your first shower and do not sleep with pets.  Day of Surgery  Do not apply any lotions/deoderants the morning of surgery.   Please wear clean clothes to the hospital/surgery center. Remember to brush your teeth with toothpaste.    Please read over the following fact sheets that you were given.

## 2018-05-22 ENCOUNTER — Ambulatory Visit (HOSPITAL_COMMUNITY)
Admission: RE | Admit: 2018-05-22 | Discharge: 2018-05-22 | Disposition: A | Discharge status: Home / Self Care | Payer: Medicare Other | Source: Ambulatory Visit | Attending: Cardiovascular Disease | Admitting: Cardiovascular Disease

## 2018-05-22 ENCOUNTER — Encounter (HOSPITAL_COMMUNITY)
Admission: RE | Admit: 2018-05-22 | Discharge: 2018-05-22 | Disposition: A | Discharge status: Home / Self Care | Payer: Medicare Other | Source: Ambulatory Visit | Attending: Cardiovascular Disease | Admitting: Cardiovascular Disease

## 2018-05-22 ENCOUNTER — Encounter (HOSPITAL_COMMUNITY): Payer: Self-pay

## 2018-05-22 DIAGNOSIS — I4891 Unspecified atrial fibrillation: Secondary | ICD-10-CM | POA: Diagnosis not present

## 2018-05-22 DIAGNOSIS — I350 Nonrheumatic aortic (valve) stenosis: Secondary | ICD-10-CM | POA: Diagnosis not present

## 2018-05-22 DIAGNOSIS — I700 Atherosclerosis of aorta: Secondary | ICD-10-CM | POA: Insufficient documentation

## 2018-05-22 DIAGNOSIS — Z01818 Encounter for other preprocedural examination: Secondary | ICD-10-CM | POA: Diagnosis not present

## 2018-05-22 HISTORY — DX: Presence of cardiac pacemaker: Z95.0

## 2018-05-22 LAB — BLOOD GAS, ARTERIAL
ACID-BASE DEFICIT: 0.4 mmol/L (ref 0.0–2.0)
BICARBONATE: 23.2 mmol/L (ref 20.0–28.0)
DRAWN BY: 470591
FIO2: 21
O2 SAT: 97.9 %
PATIENT TEMPERATURE: 98.6
pCO2 arterial: 34.3 mmHg (ref 32.0–48.0)
pH, Arterial: 7.445 (ref 7.350–7.450)
pO2, Arterial: 108 mmHg (ref 83.0–108.0)

## 2018-05-22 LAB — URINALYSIS, ROUTINE W REFLEX MICROSCOPIC
BILIRUBIN URINE: NEGATIVE
GLUCOSE, UA: NEGATIVE mg/dL
Hgb urine dipstick: NEGATIVE
Ketones, ur: NEGATIVE mg/dL
LEUKOCYTES UA: NEGATIVE
NITRITE: NEGATIVE
PH: 6 (ref 5.0–8.0)
Protein, ur: NEGATIVE mg/dL
SPECIFIC GRAVITY, URINE: 1.008 (ref 1.005–1.030)

## 2018-05-22 LAB — COMPREHENSIVE METABOLIC PANEL
ALBUMIN: 3.8 g/dL (ref 3.5–5.0)
ALK PHOS: 83 U/L (ref 38–126)
ALT: 29 U/L (ref 0–44)
ANION GAP: 14 (ref 5–15)
AST: 38 U/L (ref 15–41)
BUN: 16 mg/dL (ref 8–23)
CALCIUM: 9.2 mg/dL (ref 8.9–10.3)
CO2: 15 mmol/L — AB (ref 22–32)
Chloride: 113 mmol/L — ABNORMAL HIGH (ref 98–111)
Creatinine, Ser: 0.97 mg/dL (ref 0.61–1.24)
GFR calc non Af Amer: >60 mL/min (ref >60)
Glucose, Bld: 143 mg/dL — ABNORMAL HIGH (ref 70–99)
POTASSIUM: 4.1 mmol/L (ref 3.5–5.1)
SODIUM: 142 mmol/L (ref 135–145)
Total Bilirubin: 1.1 mg/dL (ref 0.3–1.2)
Total Protein: 6.8 g/dL (ref 6.5–8.1)

## 2018-05-22 LAB — CBC
HCT: 47.2 % (ref 39.0–52.0)
HEMOGLOBIN: 15.4 g/dL (ref 13.0–17.0)
MCH: 31.8 pg (ref 26.0–34.0)
MCHC: 32.6 g/dL (ref 30.0–36.0)
MCV: 97.5 fL (ref 78.0–100.0)
PLATELETS: 148 10*3/uL — AB (ref 150–400)
RBC: 4.84 MIL/uL (ref 4.22–5.81)
RDW: 13.4 % (ref 11.5–15.5)
WBC: 7.9 10*3/uL (ref 4.0–10.5)

## 2018-05-22 LAB — TYPE AND SCREEN
ABO/RH(D): A POS
Antibody Screen: NEGATIVE

## 2018-05-22 LAB — PROTIME-INR
INR: 1.02
PROTHROMBIN TIME: 13.3 s (ref 11.4–15.2)

## 2018-05-22 LAB — SURGICAL PCR SCREEN
MRSA, PCR: NEGATIVE
STAPHYLOCOCCUS AUREUS: NEGATIVE

## 2018-05-22 LAB — ABO/RH: ABO/RH(D): A POS

## 2018-05-22 LAB — BRAIN NATRIURETIC PEPTIDE: B NATRIURETIC PEPTIDE 5: 277.4 pg/mL — AB (ref 0.0–100.0)

## 2018-05-22 LAB — APTT: APTT: 27 s (ref 24–36)

## 2018-05-22 LAB — HEMOGLOBIN A1C
Hgb A1c MFr Bld: 5.5 % (ref 4.8–5.6)
MEAN PLASMA GLUCOSE: 111.15 mg/dL

## 2018-05-22 MED ORDER — HEPARIN SODIUM (PORCINE) 1000 UNIT/ML IJ SOLN
INTRAMUSCULAR | Status: AC
Start: 2018-05-23 — End: 2018-05-24
  Filled 2018-05-22: qty 30

## 2018-05-22 MED ORDER — SODIUM CHLORIDE 0.9 % IV SOLN
1250.0000 mg | INTRAVENOUS | Status: AC
  Filled 2018-05-22: qty 1250

## 2018-05-22 MED ORDER — NITROGLYCERIN IN D5W 200-5 MCG/ML-% IV SOLN
2.0000 ug/min | INTRAVENOUS | Status: AC
  Filled 2018-05-22: qty 250

## 2018-05-22 MED ORDER — CEFUROXIME SODIUM 1.5 G IV SOLR
1.5000 g | INTRAVENOUS | Status: AC
  Filled 2018-05-22: qty 1.5

## 2018-05-22 MED ORDER — DEXMEDETOMIDINE HCL IN NACL 400 MCG/100ML IV SOLN
0.1000 ug/kg/h | INTRAVENOUS | Status: AC
Start: 2018-05-23 — End: 2018-05-24
  Filled 2018-05-22: qty 100

## 2018-05-22 MED ORDER — DOPAMINE-DEXTROSE 3.2-5 MG/ML-% IV SOLN
0.0000 ug/kg/min | INTRAVENOUS | Status: AC
  Filled 2018-05-22: qty 250

## 2018-05-22 MED ORDER — SODIUM CHLORIDE 0.9 % IV SOLN
INTRAVENOUS | Status: AC
  Filled 2018-05-22: qty 1

## 2018-05-22 MED ORDER — EPINEPHRINE PF 1 MG/ML IJ SOLN
0.0000 ug/min | INTRAMUSCULAR | Status: AC
  Filled 2018-05-22: qty 4

## 2018-05-22 MED ORDER — PHENYLEPHRINE HCL 10 MG/ML IJ SOLN
30.0000 ug/min | INTRAMUSCULAR | Status: AC
  Filled 2018-05-22: qty 2

## 2018-05-22 MED ORDER — POTASSIUM CHLORIDE 2 MEQ/ML IV SOLN
80.0000 meq | INTRAVENOUS | Status: AC
  Filled 2018-05-22: qty 40

## 2018-05-22 MED ORDER — NOREPINEPHRINE 4 MG/250ML-% IV SOLN
0.0000 ug/min | INTRAVENOUS | Status: AC
  Filled 2018-05-22: qty 250

## 2018-05-22 MED ORDER — MAGNESIUM SULFATE 50 % IJ SOLN
40.0000 meq | INTRAMUSCULAR | Status: AC
  Filled 2018-05-22: qty 9.85

## 2018-05-23 ENCOUNTER — Other Ambulatory Visit: Payer: Self-pay

## 2018-05-23 DIAGNOSIS — I350 Nonrheumatic aortic (valve) stenosis: Secondary | ICD-10-CM

## 2018-05-29 DIAGNOSIS — H35372 Puckering of macula, left eye: Secondary | ICD-10-CM | POA: Diagnosis not present

## 2018-05-29 DIAGNOSIS — Z961 Presence of intraocular lens: Secondary | ICD-10-CM | POA: Diagnosis not present

## 2018-05-29 DIAGNOSIS — H35033 Hypertensive retinopathy, bilateral: Secondary | ICD-10-CM | POA: Diagnosis not present

## 2018-05-29 DIAGNOSIS — D3132 Benign neoplasm of left choroid: Secondary | ICD-10-CM | POA: Diagnosis not present

## 2018-05-29 MED ORDER — NOREPINEPHRINE 4 MG/250ML-% IV SOLN
0.0000 ug/min | INTRAVENOUS | Status: DC
  Filled 2018-05-29: qty 250

## 2018-05-29 MED ORDER — VANCOMYCIN HCL 10 G IV SOLR
1250.0000 mg | INTRAVENOUS | Status: AC
  Administered 2018-05-30: 1250 mg via INTRAVENOUS
  Filled 2018-05-29: qty 1250

## 2018-05-29 MED ORDER — SODIUM CHLORIDE 0.9 % IV SOLN
30.0000 ug/min | INTRAVENOUS | Status: DC
  Filled 2018-05-29: qty 2

## 2018-05-29 MED ORDER — SODIUM CHLORIDE 0.9 % IV SOLN
INTRAVENOUS | Status: DC
  Filled 2018-05-29: qty 1

## 2018-05-29 MED ORDER — EPINEPHRINE PF 1 MG/ML IJ SOLN
0.0000 ug/min | INTRAVENOUS | Status: DC
  Filled 2018-05-29: qty 4

## 2018-05-29 MED ORDER — POTASSIUM CHLORIDE 2 MEQ/ML IV SOLN
80.0000 meq | INTRAVENOUS | Status: DC
  Filled 2018-05-29: qty 40

## 2018-05-29 MED ORDER — DOPAMINE-DEXTROSE 3.2-5 MG/ML-% IV SOLN
0.0000 ug/kg/min | INTRAVENOUS | Status: DC
  Filled 2018-05-29: qty 250

## 2018-05-29 MED ORDER — DEXMEDETOMIDINE HCL IN NACL 400 MCG/100ML IV SOLN
0.1000 ug/kg/h | INTRAVENOUS | Status: AC
  Administered 2018-05-30: 1 ug/kg/h via INTRAVENOUS
  Filled 2018-05-29: qty 100

## 2018-05-29 MED ORDER — MAGNESIUM SULFATE 50 % IJ SOLN
40.0000 meq | INTRAMUSCULAR | Status: DC
  Filled 2018-05-29: qty 9.85

## 2018-05-29 MED ORDER — NITROGLYCERIN IN D5W 200-5 MCG/ML-% IV SOLN
2.0000 ug/min | INTRAVENOUS | Status: DC
  Filled 2018-05-29: qty 250

## 2018-05-29 MED ORDER — SODIUM CHLORIDE 0.9 % IV SOLN
INTRAVENOUS | Status: DC
  Filled 2018-05-29: qty 30

## 2018-05-29 MED ORDER — SODIUM CHLORIDE 0.9 % IV SOLN
1.5000 g | INTRAVENOUS | Status: AC
  Administered 2018-05-30: 1.5 g via INTRAVENOUS
  Filled 2018-05-29: qty 1.5

## 2018-05-29 NOTE — Progress Notes (Signed)
Joey with [employer] aware of surgery time and representative will be here.

## 2018-05-30 ENCOUNTER — Inpatient Hospital Stay (HOSPITAL_COMMUNITY): Payer: Medicare Other

## 2018-05-30 ENCOUNTER — Encounter (HOSPITAL_COMMUNITY): Payer: Self-pay | Admitting: Certified Registered Nurse Anesthetist

## 2018-05-30 ENCOUNTER — Inpatient Hospital Stay (HOSPITAL_COMMUNITY): Payer: Medicare Other | Admitting: Physician Assistant

## 2018-05-30 ENCOUNTER — Inpatient Hospital Stay (HOSPITAL_COMMUNITY): Payer: Medicare Other | Admitting: Certified Registered Nurse Anesthetist

## 2018-05-30 ENCOUNTER — Encounter (HOSPITAL_COMMUNITY)
Admission: RE | Disposition: A | Discharge status: Home / Self Care | Payer: Self-pay | Source: Ambulatory Visit | Attending: Cardiovascular Disease

## 2018-05-30 ENCOUNTER — Inpatient Hospital Stay (HOSPITAL_COMMUNITY)
Admission: RE | Admit: 2018-05-30 | Discharge: 2018-06-01 | DRG: 267 | Disposition: A | Discharge status: Home / Self Care | Payer: Medicare Other | Source: Ambulatory Visit | Attending: Cardiovascular Disease | Admitting: Cardiovascular Disease

## 2018-05-30 DIAGNOSIS — Z961 Presence of intraocular lens: Secondary | ICD-10-CM | POA: Diagnosis present

## 2018-05-30 DIAGNOSIS — I2510 Atherosclerotic heart disease of native coronary artery without angina pectoris: Secondary | ICD-10-CM | POA: Diagnosis not present

## 2018-05-30 DIAGNOSIS — Z952 Presence of prosthetic heart valve: Secondary | ICD-10-CM

## 2018-05-30 DIAGNOSIS — J320 Chronic maxillary sinusitis: Secondary | ICD-10-CM | POA: Diagnosis not present

## 2018-05-30 DIAGNOSIS — Z006 Encounter for examination for normal comparison and control in clinical research program: Secondary | ICD-10-CM

## 2018-05-30 DIAGNOSIS — Z950 Presence of cardiac pacemaker: Secondary | ICD-10-CM

## 2018-05-30 DIAGNOSIS — Z7982 Long term (current) use of aspirin: Secondary | ICD-10-CM

## 2018-05-30 DIAGNOSIS — K8020 Calculus of gallbladder without cholecystitis without obstruction: Secondary | ICD-10-CM | POA: Diagnosis present

## 2018-05-30 DIAGNOSIS — Z7901 Long term (current) use of anticoagulants: Secondary | ICD-10-CM | POA: Diagnosis not present

## 2018-05-30 DIAGNOSIS — E7800 Pure hypercholesterolemia, unspecified: Secondary | ICD-10-CM | POA: Diagnosis present

## 2018-05-30 DIAGNOSIS — N320 Bladder-neck obstruction: Secondary | ICD-10-CM | POA: Diagnosis present

## 2018-05-30 DIAGNOSIS — F0390 Unspecified dementia without behavioral disturbance: Secondary | ICD-10-CM | POA: Diagnosis present

## 2018-05-30 DIAGNOSIS — E042 Nontoxic multinodular goiter: Secondary | ICD-10-CM | POA: Diagnosis not present

## 2018-05-30 DIAGNOSIS — Z9842 Cataract extraction status, left eye: Secondary | ICD-10-CM

## 2018-05-30 DIAGNOSIS — R413 Other amnesia: Secondary | ICD-10-CM | POA: Diagnosis present

## 2018-05-30 DIAGNOSIS — I700 Atherosclerosis of aorta: Secondary | ICD-10-CM | POA: Diagnosis present

## 2018-05-30 DIAGNOSIS — I340 Nonrheumatic mitral (valve) insufficiency: Secondary | ICD-10-CM | POA: Diagnosis not present

## 2018-05-30 DIAGNOSIS — I352 Nonrheumatic aortic (valve) stenosis with insufficiency: Principal | ICD-10-CM | POA: Diagnosis present

## 2018-05-30 DIAGNOSIS — I442 Atrioventricular block, complete: Secondary | ICD-10-CM | POA: Diagnosis not present

## 2018-05-30 DIAGNOSIS — I350 Nonrheumatic aortic (valve) stenosis: Secondary | ICD-10-CM | POA: Diagnosis not present

## 2018-05-30 DIAGNOSIS — Z8249 Family history of ischemic heart disease and other diseases of the circulatory system: Secondary | ICD-10-CM

## 2018-05-30 DIAGNOSIS — I272 Pulmonary hypertension, unspecified: Secondary | ICD-10-CM | POA: Diagnosis not present

## 2018-05-30 DIAGNOSIS — Z9841 Cataract extraction status, right eye: Secondary | ICD-10-CM

## 2018-05-30 DIAGNOSIS — F0151 Vascular dementia with behavioral disturbance: Secondary | ICD-10-CM | POA: Diagnosis present

## 2018-05-30 DIAGNOSIS — I071 Rheumatic tricuspid insufficiency: Secondary | ICD-10-CM | POA: Diagnosis present

## 2018-05-30 DIAGNOSIS — G309 Alzheimer's disease, unspecified: Secondary | ICD-10-CM | POA: Diagnosis present

## 2018-05-30 DIAGNOSIS — H9190 Unspecified hearing loss, unspecified ear: Secondary | ICD-10-CM | POA: Diagnosis not present

## 2018-05-30 DIAGNOSIS — Z9089 Acquired absence of other organs: Secondary | ICD-10-CM

## 2018-05-30 DIAGNOSIS — E785 Hyperlipidemia, unspecified: Secondary | ICD-10-CM | POA: Diagnosis not present

## 2018-05-30 DIAGNOSIS — Z87891 Personal history of nicotine dependence: Secondary | ICD-10-CM

## 2018-05-30 DIAGNOSIS — I481 Persistent atrial fibrillation: Secondary | ICD-10-CM | POA: Diagnosis present

## 2018-05-30 DIAGNOSIS — Z823 Family history of stroke: Secondary | ICD-10-CM

## 2018-05-30 DIAGNOSIS — Z955 Presence of coronary angioplasty implant and graft: Secondary | ICD-10-CM

## 2018-05-30 DIAGNOSIS — N400 Enlarged prostate without lower urinary tract symptoms: Secondary | ICD-10-CM | POA: Diagnosis present

## 2018-05-30 DIAGNOSIS — I4819 Other persistent atrial fibrillation: Secondary | ICD-10-CM | POA: Diagnosis present

## 2018-05-30 DIAGNOSIS — J342 Deviated nasal septum: Secondary | ICD-10-CM | POA: Diagnosis not present

## 2018-05-30 HISTORY — PX: TRANSCATHETER AORTIC VALVE REPLACEMENT, TRANSFEMORAL: SHX6400

## 2018-05-30 HISTORY — PX: TEE WITHOUT CARDIOVERSION: SHX5443

## 2018-05-30 HISTORY — DX: Presence of prosthetic heart valve: Z95.2

## 2018-05-30 LAB — CBC
HEMATOCRIT: 47.4 % (ref 39.0–52.0)
HEMOGLOBIN: 15.3 g/dL (ref 13.0–17.0)
MCH: 31.4 pg (ref 26.0–34.0)
MCHC: 32.3 g/dL (ref 30.0–36.0)
MCV: 97.1 fL (ref 78.0–100.0)
Platelets: 149 10*3/uL — ABNORMAL LOW (ref 150–400)
RBC: 4.88 MIL/uL (ref 4.22–5.81)
RDW: 13.1 % (ref 11.5–15.5)
WBC: 8.2 10*3/uL (ref 4.0–10.5)

## 2018-05-30 LAB — POCT I-STAT, CHEM 8
BUN: 15 mg/dL (ref 8–23)
BUN: 15 mg/dL (ref 8–23)
BUN: 15 mg/dL (ref 8–23)
BUN: 15 mg/dL (ref 8–23)
CHLORIDE: 103 mmol/L (ref 98–111)
CHLORIDE: 103 mmol/L (ref 98–111)
CREATININE: 0.9 mg/dL (ref 0.61–1.24)
Calcium, Ion: 1.23 mmol/L (ref 1.15–1.40)
Calcium, Ion: 1.25 mmol/L (ref 1.15–1.40)
Calcium, Ion: 1.23 mmol/L (ref 1.15–1.40)
Calcium, Ion: 1.24 mmol/L (ref 1.15–1.40)
Chloride: 104 mmol/L (ref 98–111)
Chloride: 105 mmol/L (ref 98–111)
Creatinine, Ser: 0.8 mg/dL (ref 0.61–1.24)
Creatinine, Ser: 0.8 mg/dL (ref 0.61–1.24)
Creatinine, Ser: 0.8 mg/dL (ref 0.61–1.24)
Glucose, Bld: 104 mg/dL — ABNORMAL HIGH (ref 70–99)
Glucose, Bld: 113 mg/dL — ABNORMAL HIGH (ref 70–99)
Glucose, Bld: 114 mg/dL — ABNORMAL HIGH (ref 70–99)
Glucose, Bld: 123 mg/dL — ABNORMAL HIGH (ref 70–99)
HCT: 39 % (ref 39.0–52.0)
HEMATOCRIT: 39 % (ref 39.0–52.0)
HEMATOCRIT: 37 % — AB (ref 39.0–52.0)
HEMATOCRIT: 37 % — AB (ref 39.0–52.0)
HEMOGLOBIN: 13.3 g/dL (ref 13.0–17.0)
HEMOGLOBIN: 13.3 g/dL (ref 13.0–17.0)
HEMOGLOBIN: 12.6 g/dL — AB (ref 13.0–17.0)
Hemoglobin: 12.6 g/dL — ABNORMAL LOW (ref 13.0–17.0)
POTASSIUM: 4.1 mmol/L (ref 3.5–5.1)
POTASSIUM: 4.1 mmol/L (ref 3.5–5.1)
Potassium: 4.1 mmol/L (ref 3.5–5.1)
Potassium: 4.2 mmol/L (ref 3.5–5.1)
SODIUM: 140 mmol/L (ref 135–145)
SODIUM: 140 mmol/L (ref 135–145)
SODIUM: 140 mmol/L (ref 135–145)
SODIUM: 141 mmol/L (ref 135–145)
TCO2: 24 mmol/L (ref 22–32)
TCO2: 24 mmol/L (ref 22–32)
TCO2: 25 mmol/L (ref 22–32)
TCO2: 25 mmol/L (ref 22–32)

## 2018-05-30 LAB — BASIC METABOLIC PANEL
Anion gap: 8 (ref 5–15)
BUN: 15 mg/dL (ref 8–23)
CHLORIDE: 104 mmol/L (ref 98–111)
CO2: 27 mmol/L (ref 22–32)
Calcium: 9.2 mg/dL (ref 8.9–10.3)
Creatinine, Ser: 1.04 mg/dL (ref 0.61–1.24)
Glucose, Bld: 103 mg/dL — ABNORMAL HIGH (ref 70–99)
Potassium: 4.3 mmol/L (ref 3.5–5.1)
SODIUM: 139 mmol/L (ref 135–145)

## 2018-05-30 SURGERY — IMPLANTATION, AORTIC VALVE, TRANSCATHETER, FEMORAL APPROACH
Anesthesia: Monitor Anesthesia Care | Site: Chest

## 2018-05-30 MED ORDER — SODIUM CHLORIDE 0.9% FLUSH: 3.0000 mL | INTRAVENOUS | Status: DC | PRN

## 2018-05-30 MED ORDER — PHENYLEPHRINE HCL-NACL 20-0.9 MG/250ML-% IV SOLN: 0.0000 ug/min | INTRAVENOUS | Status: DC

## 2018-05-30 MED ORDER — PROTAMINE SULFATE 10 MG/ML IV SOLN
INTRAVENOUS | Status: DC | PRN
  Administered 2018-05-30: 120 mg via INTRAVENOUS

## 2018-05-30 MED ORDER — PHENYLEPHRINE 40 MCG/ML (10ML) SYRINGE FOR IV PUSH (FOR BLOOD PRESSURE SUPPORT)
PREFILLED_SYRINGE | INTRAVENOUS | Status: AC
  Filled 2018-05-30: qty 10

## 2018-05-30 MED ORDER — ASPIRIN 81 MG PO CHEW
81.0000 mg | CHEWABLE_TABLET | Freq: Every day | ORAL | Status: DC
  Administered 2018-05-31 – 2018-06-01 (×2): 81 mg via ORAL
  Filled 2018-05-30 (×2): qty 1

## 2018-05-30 MED ORDER — VANCOMYCIN HCL IN DEXTROSE 1-5 GM/200ML-% IV SOLN
1000.0000 mg | Freq: Once | INTRAVENOUS | Status: AC
  Administered 2018-05-31: 1000 mg via INTRAVENOUS
  Filled 2018-05-30: qty 200

## 2018-05-30 MED ORDER — IODIXANOL 320 MG/ML IV SOLN
INTRAVENOUS | Status: DC | PRN
  Administered 2018-05-30: 35.8 mL via INTRAVENOUS

## 2018-05-30 MED ORDER — SODIUM CHLORIDE 0.9 % IV SOLN
INTRAVENOUS | Status: AC
  Filled 2018-05-30 (×3): qty 1.2

## 2018-05-30 MED ORDER — TRAMADOL HCL 50 MG PO TABS: 50.0000 mg | ORAL_TABLET | ORAL | Status: DC | PRN

## 2018-05-30 MED ORDER — CHLORHEXIDINE GLUCONATE 4 % EX LIQD: 60.0000 mL | Freq: Once | CUTANEOUS | Status: DC

## 2018-05-30 MED ORDER — 0.9 % SODIUM CHLORIDE (POUR BTL) OPTIME
TOPICAL | Status: DC | PRN
  Administered 2018-05-30: 1000 mL

## 2018-05-30 MED ORDER — DEXMEDETOMIDINE HCL IN NACL 200 MCG/50ML IV SOLN
INTRAVENOUS | Status: AC
  Filled 2018-05-30: qty 50

## 2018-05-30 MED ORDER — ONDANSETRON HCL 4 MG/2ML IJ SOLN
INTRAMUSCULAR | Status: DC | PRN
  Administered 2018-05-30: 4 mg via INTRAVENOUS

## 2018-05-30 MED ORDER — LIDOCAINE HCL 1 % IJ SOLN
INTRAMUSCULAR | Status: DC | PRN
  Administered 2018-05-30: 20 mL

## 2018-05-30 MED ORDER — ONDANSETRON HCL 4 MG/2ML IJ SOLN: 4.0000 mg | Freq: Four times a day (QID) | INTRAMUSCULAR | Status: DC | PRN

## 2018-05-30 MED ORDER — METOPROLOL TARTRATE 5 MG/5ML IV SOLN: 2.5000 mg | INTRAVENOUS | Status: DC | PRN

## 2018-05-30 MED ORDER — MORPHINE SULFATE (PF) 10 MG/ML IV SOLN: 2.0000 mg | INTRAVENOUS | Status: DC | PRN

## 2018-05-30 MED ORDER — ONDANSETRON HCL 4 MG/2ML IJ SOLN
INTRAMUSCULAR | Status: AC
  Filled 2018-05-30: qty 4

## 2018-05-30 MED ORDER — SODIUM CHLORIDE 0.9% FLUSH
3.0000 mL | Freq: Two times a day (BID) | INTRAVENOUS | Status: DC
  Administered 2018-05-31 – 2018-06-01 (×3): 3 mL via INTRAVENOUS

## 2018-05-30 MED ORDER — CHLORHEXIDINE GLUCONATE 0.12 % MT SOLN: 15.0000 mL | Freq: Once | OROMUCOSAL | Status: DC

## 2018-05-30 MED ORDER — SODIUM CHLORIDE 0.9 % IV SOLN: 250.0000 mL | INTRAVENOUS | Status: DC | PRN

## 2018-05-30 MED ORDER — ACETAMINOPHEN 650 MG RE SUPP: 650.0000 mg | Freq: Four times a day (QID) | RECTAL | Status: DC | PRN

## 2018-05-30 MED ORDER — OXYCODONE HCL 5 MG PO TABS: 5.0000 mg | ORAL_TABLET | ORAL | Status: DC | PRN

## 2018-05-30 MED ORDER — SODIUM CHLORIDE 0.9 % IV SOLN: INTRAVENOUS | Status: DC

## 2018-05-30 MED ORDER — MORPHINE SULFATE (PF) 2 MG/ML IV SOLN: 2.0000 mg | INTRAVENOUS | Status: DC | PRN

## 2018-05-30 MED ORDER — SODIUM CHLORIDE 0.9 % IV SOLN
INTRAVENOUS | Status: DC | PRN
  Administered 2018-05-30: 1500 mL

## 2018-05-30 MED ORDER — LACTATED RINGERS IV SOLN
INTRAVENOUS | Status: DC | PRN
  Administered 2018-05-30: 15:00:00 via INTRAVENOUS

## 2018-05-30 MED ORDER — SODIUM CHLORIDE 0.9 % IV SOLN
INTRAVENOUS | Status: AC
  Administered 2018-05-30: 18:00:00 via INTRAVENOUS

## 2018-05-30 MED ORDER — NITROGLYCERIN IN D5W 200-5 MCG/ML-% IV SOLN: 0.0000 ug/min | INTRAVENOUS | Status: DC

## 2018-05-30 MED ORDER — HEPARIN SODIUM (PORCINE) 1000 UNIT/ML IJ SOLN
INTRAMUSCULAR | Status: DC | PRN
  Administered 2018-05-30: 12000 [IU] via INTRAVENOUS

## 2018-05-30 MED ORDER — LACTATED RINGERS IV SOLN
INTRAVENOUS | Status: DC
  Administered 2018-05-30: 14:00:00 via INTRAVENOUS

## 2018-05-30 MED ORDER — SODIUM CHLORIDE 0.9 % IV SOLN
1.5000 g | Freq: Two times a day (BID) | INTRAVENOUS | Status: DC
  Administered 2018-05-31 – 2018-06-01 (×3): 1.5 g via INTRAVENOUS
  Filled 2018-05-30 (×4): qty 1.5

## 2018-05-30 MED ORDER — LIDOCAINE HCL (CARDIAC) PF 100 MG/5ML IV SOSY
PREFILLED_SYRINGE | INTRAVENOUS | Status: DC | PRN
  Administered 2018-05-30: 80 mg via INTRAVENOUS

## 2018-05-30 MED ORDER — LIDOCAINE HCL (PF) 1 % IJ SOLN
INTRAMUSCULAR | Status: AC
  Filled 2018-05-30: qty 30

## 2018-05-30 MED ORDER — ACETAMINOPHEN 325 MG PO TABS
650.0000 mg | ORAL_TABLET | Freq: Four times a day (QID) | ORAL | Status: DC | PRN
  Administered 2018-05-31 (×2): 650 mg via ORAL
  Filled 2018-05-30 (×2): qty 2

## 2018-05-30 MED ORDER — CHLORHEXIDINE GLUCONATE 4 % EX LIQD: 30.0000 mL | CUTANEOUS | Status: DC

## 2018-05-30 MED ORDER — MAGNESIUM SULFATE 50 % IJ SOLN
INTRAMUSCULAR | Status: DC | PRN
  Administered 2018-05-30: 2 g via INTRAVENOUS

## 2018-05-30 SURGICAL SUPPLY — 95 items
ADH SKN CLS APL DERMABOND .7 (GAUZE/BANDAGES/DRESSINGS) ×2
BAG DECANTER FOR FLEXI CONT (MISCELLANEOUS) IMPLANT
BAG SNAP BAND KOVER 36X36 (MISCELLANEOUS) ×4 IMPLANT
BLADE CLIPPER SURG (BLADE) IMPLANT
BLADE OSCILLATING /SAGITTAL (BLADE) IMPLANT
BLADE STERNUM SYSTEM 6 (BLADE) IMPLANT
CABLE ADAPT CONN TEMP 6FT (ADAPTER) ×4 IMPLANT
CANNULA FEM VENOUS REMOTE 22FR (CANNULA) IMPLANT
CANNULA OPTISITE PERFUSION 16F (CANNULA) IMPLANT
CANNULA OPTISITE PERFUSION 18F (CANNULA) IMPLANT
CATH DIAG EXPO 6F FR4 (CATHETERS) ×2 IMPLANT
CATH DIAG EXPO 6F VENT PIG 145 (CATHETERS) ×8 IMPLANT
CATH EXPO 5FR AL1 (CATHETERS) IMPLANT
CATH INFINITI 6F AL2 (CATHETERS) IMPLANT
CATH S G BIP PACING (SET/KITS/TRAYS/PACK) ×4 IMPLANT
CLIP VESOCCLUDE MED 24/CT (CLIP) IMPLANT
CLIP VESOCCLUDE SM WIDE 24/CT (CLIP) IMPLANT
CONT SPEC 4OZ CLIKSEAL STRL BL (MISCELLANEOUS) ×8 IMPLANT
COVER BACK TABLE 24X17X13 BIG (DRAPES) IMPLANT
COVER BACK TABLE 80X110 HD (DRAPES) ×6 IMPLANT
COVER DOME SNAP 22 D (MISCELLANEOUS) IMPLANT
CRADLE DONUT ADULT HEAD (MISCELLANEOUS) ×4 IMPLANT
DERMABOND ADVANCED (GAUZE/BANDAGES/DRESSINGS) ×2
DERMABOND ADVANCED .7 DNX12 (GAUZE/BANDAGES/DRESSINGS) ×2 IMPLANT
DEVICE CLOSURE PERCLS PRGLD 6F (VASCULAR PRODUCTS) ×4 IMPLANT
DRAPE INCISE IOBAN 66X45 STRL (DRAPES) IMPLANT
DRSG TEGADERM 4X4.75 (GAUZE/BANDAGES/DRESSINGS) ×8 IMPLANT
ELECT CAUTERY BLADE 6.4 (BLADE) IMPLANT
ELECT REM PT RETURN 9FT ADLT (ELECTROSURGICAL) ×4
ELECTRODE REM PT RTRN 9FT ADLT (ELECTROSURGICAL) ×4 IMPLANT
FELT TEFLON 6X6 (MISCELLANEOUS) IMPLANT
FEMORAL VENOUS CANN RAP (CANNULA) IMPLANT
GAUZE SPONGE 4X4 12PLY STRL (GAUZE/BANDAGES/DRESSINGS) ×4 IMPLANT
GLOVE BIO SURGEON STRL SZ 6.5 (GLOVE) ×2 IMPLANT
GLOVE BIO SURGEON STRL SZ7.5 (GLOVE) ×4 IMPLANT
GLOVE BIO SURGEON STRL SZ8 (GLOVE) ×4 IMPLANT
GLOVE BIO SURGEONS STRL SZ 6.5 (GLOVE) ×2
GLOVE BIOGEL PI IND STRL 6.5 (GLOVE) IMPLANT
GLOVE BIOGEL PI IND STRL 8 (GLOVE) IMPLANT
GLOVE BIOGEL PI IND STRL 8.5 (GLOVE) IMPLANT
GLOVE BIOGEL PI INDICATOR 6.5 (GLOVE) ×12
GLOVE BIOGEL PI INDICATOR 8 (GLOVE) ×4
GLOVE BIOGEL PI INDICATOR 8.5 (GLOVE) ×4
GLOVE EUDERMIC 7 POWDERFREE (GLOVE) IMPLANT
GLOVE ORTHO TXT STRL SZ7.5 (GLOVE) ×2 IMPLANT
GOWN STRL REUS W/ TWL LRG LVL3 (GOWN DISPOSABLE) IMPLANT
GOWN STRL REUS W/ TWL XL LVL3 (GOWN DISPOSABLE) ×2 IMPLANT
GOWN STRL REUS W/TWL LRG LVL3 (GOWN DISPOSABLE) ×20
GOWN STRL REUS W/TWL XL LVL3 (GOWN DISPOSABLE) ×12
GUIDEWIRE SAFE TJ AMPLATZ EXST (WIRE) ×4 IMPLANT
GUIDEWIRE STRAIGHT .035 260CM (WIRE) ×4 IMPLANT
INSERT FOGARTY SM (MISCELLANEOUS) IMPLANT
KIT BASIN OR (CUSTOM PROCEDURE TRAY) ×4 IMPLANT
KIT DILATOR VASC 18G NDL (KITS) IMPLANT
KIT HEART LEFT (KITS) ×4 IMPLANT
KIT SUCTION CATH 14FR (SUCTIONS) IMPLANT
KIT TURNOVER KIT B (KITS) ×4 IMPLANT
LOOP VESSEL MAXI BLUE (MISCELLANEOUS) IMPLANT
LOOP VESSEL MINI RED (MISCELLANEOUS) IMPLANT
NDL PERC 18GX7CM (NEEDLE) ×2 IMPLANT
NEEDLE 22X1 1/2 (OR ONLY) (NEEDLE) ×2 IMPLANT
NEEDLE PERC 18GX7CM (NEEDLE) ×4 IMPLANT
NS IRRIG 1000ML POUR BTL (IV SOLUTION) ×8 IMPLANT
PACK ENDOVASCULAR (PACKS) ×4 IMPLANT
PAD ARMBOARD 7.5X6 YLW CONV (MISCELLANEOUS) ×8 IMPLANT
PAD ELECT DEFIB RADIOL ZOLL (MISCELLANEOUS) ×4 IMPLANT
PENCIL BUTTON HOLSTER BLD 10FT (ELECTRODE) ×2 IMPLANT
PERCLOSE PROGLIDE 6F (VASCULAR PRODUCTS) ×8
SET MICROPUNCTURE 5F STIFF (MISCELLANEOUS) ×4 IMPLANT
SHEATH BRITE TIP 6FR 35CM (SHEATH) ×4 IMPLANT
SHEATH PINNACLE 6F 10CM (SHEATH) ×4 IMPLANT
SHEATH PINNACLE 8F 10CM (SHEATH) ×4 IMPLANT
SLEEVE REPOSITIONING LENGTH 30 (MISCELLANEOUS) ×4 IMPLANT
SPONGE LAP 4X18 RFD (DISPOSABLE) IMPLANT
STOPCOCK MORSE 400PSI 3WAY (MISCELLANEOUS) ×8 IMPLANT
SUT ETHIBOND X763 2 0 SH 1 (SUTURE) IMPLANT
SUT GORETEX CV 4 TH 22 36 (SUTURE) IMPLANT
SUT GORETEX CV4 TH-18 (SUTURE) IMPLANT
SUT MNCRL AB 3-0 PS2 18 (SUTURE) IMPLANT
SUT PROLENE 5 0 C 1 36 (SUTURE) IMPLANT
SUT PROLENE 6 0 C 1 30 (SUTURE) IMPLANT
SUT SILK  1 MH (SUTURE) ×2
SUT SILK 1 MH (SUTURE) ×2 IMPLANT
SUT VIC AB 2-0 CT1 27 (SUTURE)
SUT VIC AB 2-0 CT1 TAPERPNT 27 (SUTURE) IMPLANT
SUT VIC AB 2-0 CTX 36 (SUTURE) IMPLANT
SUT VIC AB 3-0 SH 8-18 (SUTURE) IMPLANT
SYR 50ML LL SCALE MARK (SYRINGE) ×4 IMPLANT
SYR BULB IRRIGATION 50ML (SYRINGE) IMPLANT
SYR CONTROL 10ML LL (SYRINGE) ×2 IMPLANT
TOWEL GREEN STERILE (TOWEL DISPOSABLE) ×8 IMPLANT
TRANSDUCER W/STOPCOCK (MISCELLANEOUS) ×8 IMPLANT
TRAY FOLEY SLVR 16FR TEMP STAT (SET/KITS/TRAYS/PACK) IMPLANT
VALVE HEART TRANSCATH SZ3 23MM (Prosthesis & Implant Heart) ×2 IMPLANT
WIRE .035 3MM-J 145CM (WIRE) ×4 IMPLANT

## 2018-05-30 NOTE — Transfer of Care (Signed)
Immediate Anesthesia Transfer of Care Note  Patient: Jeffery Rogers  Procedure(s) Performed: TRANSCATHETER AORTIC VALVE REPLACEMENT, TRANSFEMORAL using a 66m Edwards Sapien 3 Aortic Valve (N/A Chest) TRANSESOPHAGEAL ECHOCARDIOGRAM (TEE) (N/A )  Patient Location: PACU and Cath Lab  Anesthesia Type:MAC  Level of Consciousness: awake and drowsy  Airway & Oxygen Therapy: Patient Spontanous Breathing and Patient connected to nasal cannula oxygen  Post-op Assessment: Report given to RN, Post -op Vital signs reviewed and stable and Patient moving all extremities X 4  Post vital signs: Reviewed and stable  Last Vitals:  Vitals Value Taken Time  BP 102/70 05/30/2018  4:48 PM  Temp    Pulse    Resp 17 05/30/2018  4:51 PM  SpO2    Vitals shown include unvalidated device data.  Last Pain:  Vitals:   05/30/18 1356  TempSrc:   PainSc: 0-No pain         Complications: No apparent anesthesia complications

## 2018-05-30 NOTE — Anesthesia Preprocedure Evaluation (Signed)
Anesthesia Evaluation  Patient identified by MRN, date of birth, ID band Patient awake    Reviewed: Allergy & Precautions, NPO status , Patient's Chart, lab work & pertinent test results  History of Anesthesia Complications Negative for: history of anesthetic complications  Airway Mallampati: II  TM Distance: >3 FB Neck ROM: Full    Dental  (+) Teeth Intact   Pulmonary former smoker,    breath sounds clear to auscultation       Cardiovascular + dysrhythmias + pacemaker + Valvular Problems/Murmurs AS  Rhythm:Regular + Systolic murmurs    Neuro/Psych  Neuromuscular disease negative psych ROS   GI/Hepatic negative GI ROS, Neg liver ROS,   Endo/Other  negative endocrine ROS  Renal/GU negative Renal ROS     Musculoskeletal  (+) Arthritis ,   Abdominal   Peds  Hematology Eliquis   Anesthesia Other Findings 6/19: Left ventricle: The cavity size was normal. Wall thickness was   increased in a pattern of mild LVH. Systolic function was normal.   The estimated ejection fraction was in the range of 60% to 65%.   Wall motion was normal; there were no regional wall motion   abnormalities. The study is not technically sufficient to allow   evaluation of LV diastolic function. Doppler parameters are   consistent with high ventricular filling pressure. - Aortic valve: Valve mobility was restricted. There was severe   stenosis. There was mild to moderate regurgitation. Peak velocity   (S): 416 cm/s. Mean gradient (S): 45 mm Hg. Regurgitation   pressure half-time: 585 ms. - Aorta: Ascending aortic diameter: 42 mm (S). - Ascending aorta: The ascending aorta was mildly dilated. - Mitral valve: Severely calcified annulus. Transvalvular velocity   was within the normal range. There was no evidence for stenosis.   There was mild regurgitation. - Left atrium: The atrium was severely dilated. - Right ventricle: The cavity size was  normal. Wall thickness was   normal. Systolic function was normal. - Right atrium: The atrium was moderately dilated. - Atrial septum: No defect or patent foramen ovale was identified. - Tricuspid valve: There was moderate regurgitation. - Pulmonary arteries: Systolic pressure was moderately increased.   PA peak pressure: 51 mm Hg (S).  7/19 cath:  Ost Ramus to Ramus lesion is 30% stenosed.  Prox Cx to Mid Cx lesion is 30% stenosed.  Ost LAD to Prox LAD lesion is 30% stenosed.  Prox LAD to Dist LAD lesion is 20% stenosed.  LV end diastolic pressure is normal.   1. Mild non-obstructive CAD 2. Severe aortic stenosis by echo criteria. By cath the peak to peak gradient across the valve is 7 mmHg and the mean gradient is 9.1 mmHg.   Reproductive/Obstetrics                             Anesthesia Physical Anesthesia Plan  ASA: IV  Anesthesia Plan: MAC   Post-op Pain Management:    Induction: Intravenous  PONV Risk Score and Plan: 1 and Treatment may vary due to age or medical condition  Airway Management Planned: Nasal Cannula  Additional Equipment: Arterial line, CVP and Ultrasound Guidance Line Placement  Intra-op Plan:   Post-operative Plan:   Informed Consent: I have reviewed the patients History and Physical, chart, labs and discussed the procedure including the risks, benefits and alternatives for the proposed anesthesia with the patient or authorized representative who has indicated his/her understanding and acceptance.  Dental advisory given  Plan Discussed with: CRNA and Surgeon  Anesthesia Plan Comments:         Anesthesia Quick Evaluation

## 2018-05-30 NOTE — CV Procedure (Signed)
[facility]  TAVR OPERATIVE NOTE   Date of Procedure:  05/30/2018  Preoperative Diagnosis: Severe Aortic Stenosis   Postoperative Diagnosis: Same   Procedure:    Transcatheter Aortic Valve Replacement - Transfemoral Approach  Edwards Sapien 3 THV (size 23 mm, model # [id number], serial # [id number])   Co-Surgeons:  [name], MD and Valentina Gu. Roxy Manns, MD   Anesthesiologist:  Ermalene Postin  Echocardiographer:  Meda Coffee  Pre-operative Echo Findings:  Severe aortic stenosis  Normal left ventricular systolic function  Post-operative Echo Findings:  Trivial to mild paravalvular leak  Normal left ventricular systolic function  BRIEF CLINICAL NOTE AND INDICATIONS FOR SURGERY  84 yo male with history of atrial fibrillation, aortic stenosis, complete heart block s/p PPM, hyperlipidemia who is referred today for TAVR. His aortic stenosis has been moderate and followed closely by Dr. Lovena Le over the past several years. He had complete heart block and a permanent pacemaker was implanted. Echo June 2019 with normal LV systolic function, UUVO=53-66%. There was severe aortic stenosis with thickened and calcified leaflets, mean gradient 45 mmHg, peak gradient 68 mmHg, AVA 1.08 cm2, CVI 0.31. There was mild aortic valve insufficiency.  He has dizziness but no dyspnea.   During the course of the patient's preoperative work up they have been evaluated comprehensively by a multidisciplinary team of specialists coordinated through the [facility] in the [facility].  They have been demonstrated to suffer from symptomatic severe aortic stenosis as noted above. The patient has been counseled extensively as to the relative risks and benefits of all options for the treatment of severe aortic stenosis including long term medical therapy, conventional surgery for aortic valve replacement, and transcatheter aortic valve replacement.  The patient  has been independently evaluated by two cardiac surgeons including Dr Roxy Manns and Dr. Cyndia Bent, and they are felt to be at high risk for conventional surgical aortic valve replacement. Both surgeons indicated the patient would be a poor candidate for conventional surgery. Based upon review of all of the patient's preoperative diagnostic tests they are felt to be candidate for transcatheter aortic valve replacement using the transfemoral approach as an alternative to high risk conventional surgery.    Following the decision to proceed with transcatheter aortic valve replacement, a discussion has been held regarding what types of management strategies would be attempted intraoperatively in the event of life-threatening complications, including whether or not the patient would be considered a candidate for the use of cardiopulmonary bypass and/or conversion to open sternotomy for attempted surgical intervention.  The patient has been advised of a variety of complications that might develop peculiar to this approach including but not limited to risks of death, stroke, paravalvular leak, aortic dissection or other major vascular complications, aortic annulus rupture, device embolization, cardiac rupture or perforation, acute myocardial infarction, arrhythmia, heart block or bradycardia requiring permanent pacemaker placement, congestive heart failure, respiratory failure, renal failure, pneumonia, infection, other late complications related to structural valve deterioration or migration, or other complications that might ultimately cause a temporary or permanent loss of functional independence or other long term morbidity.  The patient provides full informed consent for the procedure as described and all questions were answered preoperatively.    DETAILS OF THE OPERATIVE PROCEDURE  PREPARATION:   The patient is brought to the operating room on the above mentioned date and central monitoring was established by the  anesthesia team including placement of a radial arterial line. The patient is placed in  the supine position on the operating table.  Intravenous antibiotics are administered. Conscious sedation is used.   Baseline transthoracic echocardiogram was performed. The patient's chest, abdomen, both groins, and both lower extremities are prepared and draped in a sterile manner. A time out procedure is performed.   PERIPHERAL ACCESS:   Using the modified Seldinger technique, femoral arterial and venous access were obtained with placement of 6 Fr sheaths on the left side.  A pigtail diagnostic catheter was passed through the femoral arterial sheath under fluoroscopic guidance into the aortic root.  A temporary transvenous pacemaker catheter was passed through the femoral venous sheath under fluoroscopic guidance into the right ventricle.  The pacemaker was tested to ensure stable lead placement and pacemaker capture. Aortic root angiography was performed in order to determine the optimal angiographic angle for valve deployment.  TRANSFEMORAL ACCESS:  A micropuncture kit was used to gain access to the right femoral artery using u/s guidance. Pre-closure with double ProGlide closure devices. The patient was heparinized systemically and ACT verified > 250 seconds.    A 14 Fr transfemoral E-sheath was introduced into the right femoral artery after progressively dilating over an Amplatz superstiff wire. An AL-2 catheter was used to direct a straight-tip exchange length wire across the native aortic valve into the left ventricle. This was exchanged out for a pigtail catheter and position was confirmed in the LV apex. Simultaneous LV and Ao pressures were recorded.  The pigtail catheter was then exchanged for an Amplatz Extra-stiff wire in the LV apex.   TRANSCATHETER HEART VALVE DEPLOYMENT:  An Edwards Sapien 3 THV (size 23 mm) was prepared and crimped per manufacturer's guidelines, and the proper orientation of the  valve is confirmed on the [employer] delivery system. The valve was advanced through the introducer sheath using normal technique until in an appropriate position in the abdominal aorta beyond the sheath tip. The balloon was then retracted and using the fine-tuning wheel was centered on the valve. The valve was then advanced across the aortic arch using appropriate flexion of the catheter. The valve was carefully positioned across the aortic valve annulus. The Commander catheter was retracted using normal technique. Once final position of the valve has been confirmed by angiographic assessment, the valve is deployed while temporarily holding ventilation and during rapid ventricular pacing to maintain systolic blood pressure < 50 mmHg and pulse pressure < 10 mmHg. The balloon inflation is held for >3 seconds after reaching full deployment volume. Once the balloon has fully deflated the balloon is retracted into the ascending aorta and valve function is assessed using TTE. There is felt to be trivial to mild paravalvular leak and no central aortic insufficiency.  The patient's hemodynamic recovery following valve deployment is good.  The deployment balloon and guidewire are both removed. Echo demostrated acceptable post-procedural gradients, stable mitral valve function, and trivial to mild AI.   PROCEDURE COMPLETION:  The sheath was then removed and closure devices were completed. Protamine was administered once femoral arterial repair was complete. The temporary pacemaker, pigtail catheters and femoral sheaths were removed with manual pressure used for hemostasis.   The patient tolerated the procedure well and is transported to the surgical intensive care in stable condition. There were no immediate intraoperative complications. All sponge instrument and needle counts are verified correct at completion of the operation.   No blood products were administered during the operation.  The patient received  a total of 35.8 mL of intravenous contrast during the procedure.  [name] MD 05/30/2018 4:45 PM

## 2018-05-30 NOTE — Anesthesia Procedure Notes (Signed)
Central Venous Catheter Insertion Performed by: Oleta Mouse, MD, anesthesiologist Start/End8/27/2019 2:37 PM, 05/30/2018 2:41 PM Patient location: Pre-op. Preanesthetic checklist: patient identified, IV checked, site marked, risks and benefits discussed, surgical consent, monitors and equipment checked, pre-op evaluation, timeout performed and anesthesia consent Lidocaine 1% used for infiltration and patient sedated Hand hygiene performed  and maximum sterile barriers used  Catheter size: 8 Fr Total catheter length 16. Central line was placed.Double lumen Procedure performed using ultrasound guided technique. Ultrasound Notes:image(s) printed for medical record Attempts: 1 Following insertion, dressing applied, line sutured and Biopatch. Post procedure assessment: blood return through all ports, free fluid flow and no air  Patient tolerated the procedure well with no immediate complications.

## 2018-05-30 NOTE — Progress Notes (Signed)
Site: left femoral Sheath Size: 29fr - venous and arterial Condition prior to removal:  Level 0 Type of pressure held: manual Time pressure held: 20 minutes Status of patient during pull: stable Condition of site post pull:  Level 0 Type of dressing applied: transparent Pulses verified: bilat DPs - right DP 2+ Left DP 1+ Patient's condition post pull: stable Bedrest begins at: 5320 Post instructions given to patient: yes / also to family r/t pt's dementia

## 2018-05-30 NOTE — Op Note (Signed)
[facility]   MULTIDISCIPLINARY HEART VALVE TEAM   TAVR OPERATIVE NOTE   Date of Procedure:  05/30/2018  Preoperative Diagnosis: Severe Aortic Stenosis   Postoperative Diagnosis: Same   Procedure:    Transcatheter Aortic Valve Replacement - Percutaneous Right Transfemoral Approach  Edwards Sapien 3 THV (size 23 mm, model # [id number], serial # [id number])   Co-Surgeons:  [name], MD and Valentina Gu. Roxy Manns, MD   Anesthesiologist:  Laurie Panda, MD  Echocardiographer:  Ena Dawley, MD  Pre-operative Echo Findings:  Severe aortic stenosis  Normal left ventricular systolic function  Post-operative Echo Findings:  Mild paravalvular leak  Normal left ventricular systolic function   BRIEF CLINICAL NOTE AND INDICATIONS FOR SURGERY  The patient is an 84 year old gentleman with history of hyperlipidemia, persistent atrial fibrillation on Eliquis, complete heart block status post permanent pacemaker in 2017, and aortic stenosis that has been followed by Dr. Lovena Le.  He had an echo in May 2017 which showed a trileaflet aortic valve with severe thickening and calcification and restricted leaflet mobility with a mean gradient of 34 mmHg consistent with moderate aortic stenosis.  He had a follow-up echocardiogram on 03/24/2018 which showed progression to severe aortic stenosis with a mean gradient of 45 mmHg and a peak gradient of 68 mmHg.  Left ventricular ejection fraction was 60 to 65%.  He is here with his wife today.  He says that he is always been very active and used to ride his road bike 60 to 75 miles per week but quit about 3 years ago at his family's insistent due to some memory loss.  He says that he is not as active as he used to be a year ago but denies any shortness of breath or fatigue with his usual daily activity.  His wife said that he does complain of some shortness of breath when he exerts himself and has been tired towards the end of the day.  He  denies any chest pain or pressure.  He has had no lower extremity edema.  He said that he has had some dizziness when standing but no syncope.  During the course of the patient's preoperative work up they have been evaluated comprehensively by a multidisciplinary team of specialists coordinated through the [facility] in the [facility].  They have been demonstrated to suffer from symptomatic severe aortic stenosis as noted above. The patient has been counseled extensively as to the relative risks and benefits of all options for the treatment of severe aortic stenosis including long term medical therapy, conventional surgery for aortic valve replacement, and transcatheter aortic valve replacement.  All questions have been answered, and the patient provides full informed consent for the operation as described.   DETAILS OF THE OPERATIVE PROCEDURE  PREPARATION:    The patient is brought to the operating room on the above mentioned date and central monitoring was established by the anesthesia team including placement of a central venous line and radial arterial line. The patient is placed in the supine position on the operating table.  Intravenous antibiotics are administered. The patient is monitored closely throughout the procedure under conscious sedation.  Baseline transthoracic echocardiogram was performed. The patient's chest, abdomen, both groins, and both lower extremities are prepared and draped in a sterile manner. A time out procedure is performed.   PERIPHERAL ACCESS:    Using the modified Seldinger technique, femoral arterial and venous access was obtained with placement of 6  Fr sheaths on the left side.  A pigtail diagnostic catheter was passed through the left arterial sheath under fluoroscopic guidance into the aortic root.  A temporary transvenous pacemaker catheter was passed through the left femoral venous sheath under fluoroscopic  guidance into the right ventricle.  The pacemaker was tested to ensure stable lead placement and pacemaker capture. Aortic root angiography was performed in order to determine the optimal angiographic angle for valve deployment.   TRANSFEMORAL ACCESS:   Percutaneous transfemoral access and sheath placement was performed by Dr. Angelena Form using ultrasound guidance.  The right common femoral artery was cannulated using a micropuncture needle and appropriate location was verified using hand injection angiogram.  A pair of Abbott Perclose percutaneous closure devices were placed and a 6 French sheath replaced into the femoral artery.  The patient was heparinized systemically and ACT verified > 250 seconds.    A 14 Fr transfemoral E-sheath was introduced into the right common femoral artery after progressively dilating over an Amplatz superstiff wire. An AL-2 catheter was used to direct a straight-tip exchange length wire across the native aortic valve into the left ventricle. This was exchanged out for a pigtail catheter and position was confirmed in the LV apex. Simultaneous LV and Ao pressures were recorded.  The pigtail catheter was exchanged for an Amplatz Extra-stiff wire in the LV apex.  Echocardiography was utilized to confirm appropriate wire position and no sign of entanglement in the mitral subvalvular apparatus.   TRANSCATHETER HEART VALVE DEPLOYMENT:   An Edwards Sapien 3 transcatheter heart valve (size 23 mm, model #9600TFX, serial #[id number]) was prepared and crimped per manufacturer's guidelines, and the proper orientation of the valve is confirmed on the [employer] delivery system. The valve was advanced through the introducer sheath using normal technique until in an appropriate position in the abdominal aorta beyond the sheath tip. The balloon was then retracted and using the fine-tuning wheel was centered on the valve. The valve was then advanced across the aortic arch using appropriate  flexion of the catheter. The valve was carefully positioned across the aortic valve annulus. The Commander catheter was retracted using normal technique. Once final position of the valve has been confirmed by angiographic assessment, the valve is deployed while temporarily holding ventilation and during rapid ventricular pacing to maintain systolic blood pressure < 50 mmHg and pulse pressure < 10 mmHg. The balloon inflation is held for >3 seconds after reaching full deployment volume. Once the balloon has fully deflated the balloon is retracted into the ascending aorta and valve function is assessed using echocardiography. There is felt to be mild paravalvular leak and no central aortic insufficiency.  The patient's hemodynamic recovery following valve deployment is good.  The deployment balloon and guidewire are both removed.    PROCEDURE COMPLETION:   The sheath was removed and femoral artery closure performed by Dr Angelena Form.  Protamine was administered once femoral arterial repair was complete. The temporary pacemaker, pigtail catheters and femoral sheaths were removed with manual pressure used for hemostasis.   The patient tolerated the procedure well and is transported to the surgical intensive care in stable condition. There were no immediate intraoperative complications. All sponge instrument and needle counts are verified correct at completion of the operation.   No blood products were administered during the operation.  The patient received a total of 35.8 mL of intravenous contrast during the procedure.   Rexene Alberts, MD 05/30/2018 4:40 PM

## 2018-05-30 NOTE — Interval H&P Note (Signed)
History and Physical Interval Note:  05/30/2018 2:39 PM  Jeffery Rogers  has presented today for surgery, with the diagnosis of Severe Aortic Stenosis  The various methods of treatment have been discussed with the patient and family. After consideration of risks, benefits and other options for treatment, the patient has consented to  Procedure(s): TRANSCATHETER AORTIC VALVE REPLACEMENT, TRANSFEMORAL (N/A) TRANSESOPHAGEAL ECHOCARDIOGRAM (TEE) (N/A) as a surgical intervention .  The patient's history has been reviewed, patient examined, no change in status, stable for surgery.  I have reviewed the patient's chart and labs.  Questions were answered to the patient's satisfaction.     [name]

## 2018-05-30 NOTE — Progress Notes (Signed)
Patient bedrest up. groins level zero Patient ambulated approx 350 feet in hall tolerated fair, felt dizzy towards the end of the walk.

## 2018-05-30 NOTE — Progress Notes (Signed)
  Echocardiogram 2D Echocardiogram has been performed.  Jeffery Rogers 05/30/2018, 4:39 PM

## 2018-05-30 NOTE — Plan of Care (Signed)

## 2018-05-30 NOTE — Anesthesia Procedure Notes (Signed)
Arterial Line Insertion Start/End8/27/2019 1:45 PM, 05/30/2018 2:01 PM Performed by: Josephine Igo, CRNA, CRNA  Patient location: Pre-op. Preanesthetic checklist: patient identified, IV checked, risks and benefits discussed, surgical consent and monitors and equipment checked Lidocaine 1% used for infiltration Right, radial was placed Catheter size: 20 G Hand hygiene performed , maximum sterile barriers used  and Seldinger technique used  Attempts: 1 Procedure performed without using ultrasound guided technique. Following insertion, dressing applied and Biopatch. Post procedure assessment: normal

## 2018-05-31 ENCOUNTER — Other Ambulatory Visit: Payer: Self-pay | Admitting: Physician Assistant

## 2018-05-31 ENCOUNTER — Encounter (HOSPITAL_COMMUNITY): Payer: Self-pay

## 2018-05-31 ENCOUNTER — Inpatient Hospital Stay (HOSPITAL_COMMUNITY): Payer: Medicare Other

## 2018-05-31 DIAGNOSIS — Z952 Presence of prosthetic heart valve: Secondary | ICD-10-CM

## 2018-05-31 DIAGNOSIS — I4819 Other persistent atrial fibrillation: Secondary | ICD-10-CM | POA: Diagnosis present

## 2018-05-31 DIAGNOSIS — I340 Nonrheumatic mitral (valve) insufficiency: Secondary | ICD-10-CM

## 2018-05-31 DIAGNOSIS — Z950 Presence of cardiac pacemaker: Secondary | ICD-10-CM | POA: Diagnosis present

## 2018-05-31 DIAGNOSIS — I350 Nonrheumatic aortic (valve) stenosis: Secondary | ICD-10-CM

## 2018-05-31 LAB — BASIC METABOLIC PANEL
Anion gap: 9 (ref 5–15)
BUN: 15 mg/dL (ref 8–23)
CO2: 25 mmol/L (ref 22–32)
Calcium: 8.4 mg/dL — ABNORMAL LOW (ref 8.9–10.3)
Chloride: 104 mmol/L (ref 98–111)
Creatinine, Ser: 1 mg/dL (ref 0.61–1.24)
GFR calc Af Amer: >60 mL/min (ref >60)
GLUCOSE: 109 mg/dL — AB (ref 70–99)
POTASSIUM: 3.9 mmol/L (ref 3.5–5.1)
Sodium: 138 mmol/L (ref 135–145)

## 2018-05-31 LAB — ECHOCARDIOGRAM COMPLETE
Height: 71 in
Weight: 2892.4352 oz

## 2018-05-31 LAB — CBC
HCT: 40 % (ref 39.0–52.0)
Hemoglobin: 13.4 g/dL (ref 13.0–17.0)
MCH: 31.8 pg (ref 26.0–34.0)
MCHC: 33.5 g/dL (ref 30.0–36.0)
MCV: 94.8 fL (ref 78.0–100.0)
PLATELETS: 118 10*3/uL — AB (ref 150–400)
RBC: 4.22 MIL/uL (ref 4.22–5.81)
RDW: 13.1 % (ref 11.5–15.5)
WBC: 9.3 10*3/uL (ref 4.0–10.5)

## 2018-05-31 LAB — MAGNESIUM: Magnesium: 2.1 mg/dL (ref 1.7–2.4)

## 2018-05-31 MED ORDER — QUETIAPINE FUMARATE 25 MG PO TABS
25.0000 mg | ORAL_TABLET | Freq: Every day | ORAL | Status: DC
  Administered 2018-05-31: 25 mg via ORAL
  Filled 2018-05-31: qty 1

## 2018-05-31 NOTE — Discharge Instructions (Signed)
ACTIVITY AND EXERCISE °• Daily activity and exercise are an important part of your recovery. People recover at different rates depending on their general health and type of valve procedure. °• Most people recovering from TAVR feel better relatively quickly  °• No lifting, pushing, pulling more than 10 pounds (examples to avoid: groceries, vacuuming, gardening, golfing): °            - For one week with a procedure through the groin. °            - For six weeks for procedures through the chest wall. °            - For three months for procedures through the breast-bone. °NOTE: You will typically see one of our providers 7-10 days after your procedure to discuss WHEN TO RESUME the above activities.  °  °  °DRIVING °• Do not drive for until you are seen for follow up and cleared by a provider. °• If you have been told by your doctor in the past that you may not drive, you must talk with him/her before you begin driving again. °  °  °DRESSING °• Groin site: you may leave the clear dressing over the site for up to one week or until it falls off. °  °  °HYGIENE °• If you had a femoral (leg) procedure, you may take a shower when you return home. After the shower, pat the site dry. Do NOT use powder, oils or lotions in your groin area until the site has completely healed. °• If you had a chest procedure, you may shower when you return home unless specifically instructed not to by your discharging practitioner. °            - DO NOT scrub incision; pat dry with a towel °            - DO NOT apply any lotions, oils, powders to the incision °            - No tub baths / swimming for at least 2 weeks. °• If you notice any fevers, chills, increased pain, swelling, bleeding or pus, please contact your doctor. °  °ADDITIONAL INFORMATION °• If you are going to have an upcoming dental procedure, please contact our office as you will require antibiotics ahead of time to prevent infection on your heart valve.  ° ° ° ° ° °After TAVR  Checklist ° °Check  Test Description  ° Follow up appointment in 1-2 weeks  You will see our structural heart physician assistant, Katie Ilian Wessell. Your incision sites will be checked and you will be cleared to drive and resume all normal activities if you are doing well.    ° 1 month echo and follow up  You will have an echo to check on your new heart valve and be seen back in the office by Katie Cheron Pasquarelli. Many times the echo is not read by your appointment time, but Katie will call you later that day or the following day to report your results.  ° Follow up with your primary cardiologist You will need to be seen by your primary cardiologist in the following 3-6 months after your 1 month appointment in the valve clinic. Often times your Plavix or Aspirin will be discontinued during this time, but this is decided on a case by case basis.   ° 1 year echo and follow up You will have another echo to check on your heart valve   after 1 year and be seen back in the office by Katie Raeven Pint. This your last structural heart visit.  ° Bacterial endocarditis prophylaxis  You will have to take antibiotics for the rest of your life before all dental procedures (even teeth cleanings) to protect your heart valve. Antibiotics are also required before some surgeries. Please check with your cardiologist before scheduling any surgeries. Also, please make sure to tell us if you have a penicillin allergy as you will require an alternative antibiotic.   ° ° °

## 2018-05-31 NOTE — Progress Notes (Signed)
CARDIAC REHAB PHASE I   PRE:  Rate/Rhythm: 65 pacing    BP: sitting 99/70    SaO2: 97 RA  MODE:  Ambulation: 470 ft   POST:  Rate/Rhythm: 75 pacing on demand    BP: sitting 130/72     SaO2: 100 RA  Pt moving fairly well. He was slightly unsteady on turns. He did c/o being "heavy headed" and having sinus pressure that is bothering him, may be making him dizzy. He must forget he says this, told me numerous times. Return to recliner, wife present and friend. Will f/u tomorrow. [place], ACSM 05/31/2018 2:46 PM

## 2018-05-31 NOTE — Progress Notes (Signed)
Patient ambulated approximately 300 feet in the hall. Tolerated well. No complaints of dizziness.

## 2018-05-31 NOTE — Progress Notes (Addendum)
[facility] VALVE TEAM  Patient Name: Jeffery Rogers Date of Encounter: 05/31/2018  Primary Cardiologist: Dr. Lovena Le / Dr. Angelena Form & Dr. Roxy Manns (TAVR)   Hospital Problem List     Principal Problem:   S/P TAVR (transcatheter aortic valve replacement) Active Problems:   Hypercholesteremia   Severe aortic stenosis   Presence of permanent cardiac pacemaker   Persistent atrial fibrillation (HCC)     Subjective   No complaints. Up eating lunch. Feeling well. Family at bedside.   Inpatient Medications    Scheduled Meds: . aspirin  81 mg Oral Daily  . sodium chloride flush  3 mL Intravenous Q12H   Continuous Infusions: . sodium chloride    . cefUROXime (ZINACEF)  IV 1.5 g (05/31/18 0217)  . lactated ringers    . nitroGLYCERIN    . phenylephrine (NEO-SYNEPHRINE) Adult infusion     PRN Meds: sodium chloride, acetaminophen **OR** acetaminophen, metoprolol tartrate, morphine injection, ondansetron (ZOFRAN) IV, oxyCODONE, sodium chloride flush, traMADol   Vital Signs    Vitals:   05/31/18 0400 05/31/18 0408 05/31/18 0800 05/31/18 1150  BP: 133/79  129/83 103/67  Pulse: 60  71 63  Resp: _0 Temp: 98.6 F (37 C)     TempSrc: Oral     SpO2: 100%  97% 98%  Weight:  82 kg    Height:        Intake/Output Summary (Last 24 hours) at 05/31/2018 1243 Last data filed at 05/31/2018 0911 Gross per 24 hour  Intake 850 ml  Output 20 ml  Net 830 ml   Filed Weights   05/30/18 1356 05/30/18 1854 05/31/18 0408  Weight: 75.1 kg 81.3 kg 82 kg    Physical Exam   GEN: Well nourished, well developed, in no acute distress.  HEENT: Grossly normal.  Neck: Supple, no JVD, carotid bruits, or masses. Cardiac: irreg irreg, no murmurs, rubs, or gallops. No clubbing, cyanosis, edema.  Radials/DP/PT 2+ and equal bilaterally.  Respiratory:  Respirations regular and unlabored, clear to auscultation bilaterally. GI: Soft, nontender, nondistended,  BS + x 4. MS: no deformity or atrophy. Skin: warm and dry, no rash. Neuro:  Strength and sensation are intact. Psych: AAOx3.  Normal affect.  Labs    CBC Recent Labs    05/30/18 1408  05/30/18 1656 05/31/18 0330  WBC 8.2  --   --  9.3  HGB 15.3   < > 12.6* 13.4  HCT 47.4   < > 37.0* 40.0  MCV 97.1  --   --  94.8  PLT 149*  --   --  118*   < > = values in this interval not displayed.   Basic Metabolic Panel Recent Labs    05/30/18 1408  05/30/18 1656 05/31/18 0330  NA 139   < > 141 138  K 4.3   < > 4.1 3.9  CL 104   < > 103 104  CO2 27  --   --  25  GLUCOSE 103*   < > 114* 109*  BUN 15   < > 15 15  CREATININE 1.04   < > 0.80 1.00  CALCIUM 9.2  --   --  8.4*  MG  --   --   --  2.1   < > = values in this interval not displayed.   Liver Function Tests No results for input(s): AST, ALT, ALKPHOS, BILITOT, PROT, ALBUMIN in the last 72 hours.  No results for input(s): LIPASE, AMYLASE in the last 72 hours. Cardiac Enzymes No results for input(s): CKTOTAL, CKMB, CKMBINDEX, TROPONINI in the last 72 hours. BNP Invalid input(s): POCBNP D-Dimer No results for input(s): DDIMER in the last 72 hours. Hemoglobin A1C No results for input(s): HGBA1C in the last 72 hours. Fasting Lipid Panel No results for input(s): CHOL, HDL, LDLCALC, TRIG, CHOLHDL, LDLDIRECT in the last 72 hours. Thyroid Function Tests No results for input(s): TSH, T4TOTAL, T3FREE, THYROIDAB in the last 72 hours.  Invalid input(s): FREET3  Telemetry    V paced, underlying afib/flutter - Personally Reviewed  ECG    V paced - Personally Reviewed  Radiology    Dg Chest Port 1 View  Result Date: 05/30/2018 CLINICAL DATA:  TAVR. EXAM: PORTABLE CHEST 1 VIEW COMPARISON:  Chest x-ray dated May 22, 2018. FINDINGS: New right internal jugular central venous catheter with the tip at the cavoatrial junction. Interval TAVR. Unchanged left chest wall pacemaker. The heart size and mediastinal contours are within  normal limits. Normal pulmonary vascularity. No focal consolidation, pleural effusion, or pneumothorax. No acute osseous abnormality. IMPRESSION: Interval TAVR.  No active disease. Electronically Signed   By: Titus Dubin M.D.   On: 05/30/2018 19:36    Cardiac Studies   TAVR OPERATIVE NOTE   Date of Procedure:                05/30/2018  Preoperative Diagnosis:      Severe Aortic Stenosis   Procedure:        Transcatheter Aortic Valve Replacement - Transfemoral Approach             Edwards Sapien 3 THV (size 23 mm, model # F048547, serial # [id number])              Co-Surgeons:                        [name], MD and Valentina Gu. Roxy Manns, MD   Pre-operative Echo Findings: ? Severe aortic stenosis ? Normal left ventricular systolic function  Post-operative Echo Findings: ? Trivial to mild paravalvular leak ? Normal left ventricular systolic function  ______________   2D ECHO 12/01/17 Study Conclusions - Left ventricle: The cavity size was normal. There was severe   concentric hypertrophy. Systolic function was vigorous. The   estimated ejection fraction was in the range of 65% to 70%. Wall   motion was normal; there were no regional wall motion   abnormalities. The study is not technically sufficient to allow   evaluation of LV diastolic function. Doppler parameters are   consistent with high ventricular filling pressure. - Aortic valve: S/P TAVR well functioning with no obvious   perivalvular AR. Mean gradient (S): 14 mm Hg. Valve area (VTI):   1.86 cm^2. Valve area (Vmax): 1.65 cm^2. Valve area (Vmean): 1.99   cm^2. - Mitral valve: Severely calcified annulus. There was mild   regurgitation. Valve area by pressure half-time: 1.64 cm^2. - Left atrium: The atrium was moderately dilated. - Right ventricle: Systolic function was mildly reduced. - Right atrium: The atrium was moderately dilated. - Pulmonary arteries: PA peak pressure: 41 mm Hg  (S). Impressions: - Severe LVH, normal LVF with EF 65-70%, Insufficient to assess   diastolic function, mild TR, moderate biatrial enlargement,   mildly reduced RFV, severe MAC with mild MR. S/P TAVR with mean   AVG 70mHg and no perivalvular AI. Compared to prior echo, TAVR   is now  present. The right ventricular systolic pressure was   increased consistent with moderate pulmonary hypertension.   Patient Profile     Jeffery Rogers is a 84 y.o. male with a history of persistent atrial fibrillation on Eliquis, CHB s/p PPM (2017), dementia, hearing loss and severe AS who presented to [facility] on 05/30/18 for planned TAVR.   Assessment & Plan    Severe AS: s/p successful TAVR with a 23 mm Edwards Sapien 3 THV via the TF approach on 05/30/18. Post operative echo showed EF 65-70%, normally functioning valve with no obvious PVL and mean gradient 14 mm Hg. Groin sites are stable. He is on Aspirin 17m daily and we will resume home Eliquis at discharge. Plan for probable discharge home tomorrow.  Persistent atrial fibrillation: as above will restart Eliquis at discharge.   S/p PPM: this is followed by Dr. TLovena Le  Signed, KAngelena Form PA-C  05/31/2018, 12:43 PM  Pager [phone] I have personally seen and examined this patient. I agree with the assessment and plan as outlined above. He is doing well post TAVR. Echo today with normal LV systolic function, normally functioning AVR. No PVL. Will continue ASA and will plan to resume Eliquis tomorrow. Probable discharge home tomorrow.   CLauree Chandler8/28/2019 1:02 PM

## 2018-05-31 NOTE — Anesthesia Postprocedure Evaluation (Signed)
Anesthesia Post Note  Patient: Jeffery Rogers  Procedure(s) Performed: TRANSCATHETER AORTIC VALVE REPLACEMENT, TRANSFEMORAL using a 16m Edwards Sapien 3 Aortic Valve (N/A Chest) TRANSESOPHAGEAL ECHOCARDIOGRAM (TEE) (N/A )     Patient location during evaluation: PACU Anesthesia Type: MAC Level of consciousness: awake and alert Pain management: pain level controlled Vital Signs Assessment: post-procedure vital signs reviewed and stable Respiratory status: spontaneous breathing, nonlabored ventilation, respiratory function stable and patient connected to nasal cannula oxygen Cardiovascular status: stable and blood pressure returned to baseline Postop Assessment: no apparent nausea or vomiting Anesthetic complications: no    Last Vitals:  Vitals:   05/31/18 0400 05/31/18 0800  BP: 133/79 129/83  Pulse: 60 71  Resp: 20 17  Temp: 37 C   SpO2: 100% 97%    Last Pain:  Vitals:   05/31/18 0400  TempSrc: Oral  PainSc:                  [name]

## 2018-06-01 ENCOUNTER — Encounter (HOSPITAL_COMMUNITY): Payer: Self-pay | Admitting: Cardiovascular Disease

## 2018-06-01 DIAGNOSIS — F0151 Vascular dementia with behavioral disturbance: Secondary | ICD-10-CM | POA: Diagnosis present

## 2018-06-01 DIAGNOSIS — G309 Alzheimer's disease, unspecified: Secondary | ICD-10-CM | POA: Diagnosis present

## 2018-06-01 DIAGNOSIS — R413 Other amnesia: Secondary | ICD-10-CM | POA: Diagnosis present

## 2018-06-01 LAB — CBC
HCT: 38 % — ABNORMAL LOW (ref 39.0–52.0)
HEMOGLOBIN: 12.5 g/dL — AB (ref 13.0–17.0)
MCH: 31.4 pg (ref 26.0–34.0)
MCHC: 32.9 g/dL (ref 30.0–36.0)
MCV: 95.5 fL (ref 78.0–100.0)
Platelets: 93 10*3/uL — ABNORMAL LOW (ref 150–400)
RBC: 3.98 MIL/uL — AB (ref 4.22–5.81)
RDW: 13.3 % (ref 11.5–15.5)
WBC: 7.5 10*3/uL (ref 4.0–10.5)

## 2018-06-01 LAB — BASIC METABOLIC PANEL
Anion gap: 5 (ref 5–15)
BUN: 11 mg/dL (ref 8–23)
CHLORIDE: 106 mmol/L (ref 98–111)
CO2: 27 mmol/L (ref 22–32)
Calcium: 8.5 mg/dL — ABNORMAL LOW (ref 8.9–10.3)
Creatinine, Ser: 0.97 mg/dL (ref 0.61–1.24)
GFR calc non Af Amer: >60 mL/min (ref >60)
Glucose, Bld: 89 mg/dL (ref 70–99)
Potassium: 3.7 mmol/L (ref 3.5–5.1)
SODIUM: 138 mmol/L (ref 135–145)

## 2018-06-01 MED ORDER — VITAMIN C 500 MG PO TABS
500.0000 mg | ORAL_TABLET | Freq: Every day | ORAL | Status: DC
  Administered 2018-06-01: 500 mg via ORAL

## 2018-06-01 MED ORDER — SERTRALINE HCL 50 MG PO TABS
50.0000 mg | ORAL_TABLET | Freq: Every day | ORAL | Status: DC
  Filled 2018-06-01: qty 1

## 2018-06-01 MED ORDER — APIXABAN 5 MG PO TABS
5.0000 mg | ORAL_TABLET | Freq: Two times a day (BID) | ORAL | Status: DC
  Administered 2018-06-01: 5 mg via ORAL
  Filled 2018-06-01: qty 1

## 2018-06-01 MED ORDER — DONEPEZIL HCL 5 MG PO TABS: 10.0000 mg | ORAL_TABLET | Freq: Every day | ORAL | Status: DC

## 2018-06-01 MED ORDER — VITAMIN D3 25 MCG (1000 UNIT) PO TABS
1000.0000 [IU] | ORAL_TABLET | Freq: Every day | ORAL | Status: DC
  Administered 2018-06-01: 1000 [IU] via ORAL
  Filled 2018-06-01: qty 1

## 2018-06-01 MED ORDER — ATORVASTATIN CALCIUM 40 MG PO TABS: 40.0000 mg | ORAL_TABLET | Freq: Every day | ORAL | Status: DC

## 2018-06-01 MED ORDER — ASPIRIN 81 MG PO CHEW: 81.0000 mg | CHEWABLE_TABLET | Freq: Every day | ORAL | Status: DC

## 2018-06-01 MED ORDER — MEMANTINE HCL 10 MG PO TABS
10.0000 mg | ORAL_TABLET | Freq: Two times a day (BID) | ORAL | Status: DC
  Administered 2018-06-01: 10 mg via ORAL

## 2018-06-01 MED FILL — Potassium Chloride Inj 2 mEq/ML: INTRAVENOUS | Qty: 40 | Status: AC

## 2018-06-01 MED FILL — Heparin Sodium (Porcine) Inj 1000 Unit/ML: INTRAMUSCULAR | Qty: 30 | Status: AC

## 2018-06-01 MED FILL — Magnesium Sulfate Inj 50%: INTRAMUSCULAR | Qty: 10 | Status: AC

## 2018-06-01 MED FILL — Phenylephrine HCl IV Soln 10 MG/ML: INTRAVENOUS | Qty: 2 | Status: AC

## 2018-06-01 MED FILL — Sodium Chloride IV Soln 0.9%: INTRAVENOUS | Qty: 250 | Status: AC

## 2018-06-01 NOTE — Progress Notes (Signed)
Pulled Central Line per provider orders. Catheter intact when pulled and held pressure for 7 minutes. Patient tolerated removal well. Vital signs B/P 145/82 HR 70 RR20. Placed Vaseline Gauze and Gauze over site. Site and dressing are clean, dry and intact. Will continue to monitor site.

## 2018-06-01 NOTE — Plan of Care (Signed)
  Problem: Education: Goal: Knowledge of General Education information will improve Description Including pain rating scale, medication(s)/side effects and non-pharmacologic comfort measures Outcome: Progressing   Problem: Health Behavior/Discharge Planning: Goal: Ability to manage health-related needs will improve Outcome: Progressing   

## 2018-06-01 NOTE — Progress Notes (Signed)
Discussed restrictions, walking guidelines, and CRPII with pt and family. Good reception, will refer to [place].  [phone] Yves Dill CES, ACSM 10:23 AM 06/01/2018

## 2018-06-01 NOTE — Discharge Summary (Addendum)
[facility] VALVE TEAM   Discharge Summary    Patient ID: Jeffery Rogers,  MRN: [id number], DOB/AGE: Sep 03, 1932 84 y.o.  Admit date: 05/30/2018 Discharge date: 06/01/2018  Primary Care Provider: [name] Primary Cardiologist: Dr. Lovena Le / Dr. Angelena Form & Dr. Roxy Manns (TAVR)    Discharge Diagnoses    Principal Problem:   S/P TAVR (transcatheter aortic valve replacement) Active Problems:   Hypercholesteremia   Severe aortic stenosis   Presence of permanent cardiac pacemaker   Persistent atrial fibrillation ([place])   Memory loss   Allergies No Known Allergies   History of Present Illness     Jeffery Rogers is a 84 y.o. male with a history of persistent atrial fibrillation on Eliquis, CHB s/p PPM (2017), dementia, hearing loss and severe AS who presented to [facility] on 05/30/18 for planned TAVR.   He had an echo in May 2017 which showed a trileaflet aortic valve with severe thickening and calcification and restricted leaflet mobility with a mean gradient of 34 mmHg consistent with moderate aortic stenosis.  He had a follow-up echocardiogram on 03/24/2018 which showed progression to severe aortic stenosis with a mean gradient of 45 mmHg and a peak gradient of 68 mmHg.  Left ventricular ejection fraction was 60 to 65%.  He is here with his wife today.  He says that he is always been very active and used to ride his road bike 60 to 75 miles per week but quit about 3 years ago at his family's insistent due to some memory loss.  He says that he is not as active as he used to be a year ago but denies any shortness of breath or fatigue with his usual daily activity.  His wife said that he does complain of some shortness of breath when he exerts himself and has been tired towards the end of the day. [facility] 05/03/18 showed mild non obst CAD.  He was evaluated by the multidisciplinary valve team and felt to be a suitable TAVR candidate, which was set up for  05/30/18.  Hospital Course     Consultants: none  Severe AS:s/p successful TAVR with a 23 mm Edwards Sapien 3 THV via the TF approach on 05/30/18. Post operative echo showed EF 65-70%, normally functioning valve with no obvious PVL and mean gradient 14 mm Hg. Groin sites are stable. He is on Aspirin 72m daily and we will resume home Eliquis today. Discharge home with 1 week follow up in clinic.   Persistent atrial fibrillation: as above will restart Eliquis at discharge.   S/p PPM: this is followed by Dr. TLovena Le  Dementia: continue current regimen   The patient has had an uncomplicated hospital course and is recovering well. The femoral catheter sites are stable. He has been seen by Dr. MAngelena Formtoday and deemed ready for discharge home. All follow-up appointments have been scheduled. Discharge medications are listed below.  _____________  Discharge Vitals Blood pressure (!) 141/81, pulse 60, temperature 98.4 F (36.9 C), temperature source Oral, resp. rate 19, height _0  (1.803 m), weight 83.4 kg, SpO2 100 %.  Filed Weights   05/30/18 1854 05/31/18 0408 06/01/18 0318  Weight: 81.3 kg 82 kg 83.4 kg    GEN: Well nourished, well developed, in no acute distress  HEENT: normal  Neck: no JVD, carotid bruits, or masses Cardiac: irreg irreg; no murmurs, rubs, or gallops,no edema  Respiratory:  clear to auscultation bilaterally, normal work of breathing GI:  soft, nontender, nondistended, + BS MS: no deformity or atrophy  Skin: warm and dry, no rash Neuro:  Alert and Oriented x 3, Strength and sensation are intact Psych: euthymic mood, full affect    Labs & Radiologic Studies     CBC Recent Labs    05/31/18 0330 06/01/18 0635  WBC 9.3 7.5  HGB 13.4 12.5*  HCT 40.0 38.0*  MCV 94.8 95.5  PLT 118* 93*   Basic Metabolic Panel Recent Labs    05/31/18 0330 06/01/18 0635  NA 138 138  K 3.9 3.7  CL 104 106  CO2 25 27  GLUCOSE 109* 89  BUN 15 11  CREATININE 1.00  0.97  CALCIUM 8.4* 8.5*  MG 2.1  --    Liver Function Tests No results for input(s): AST, ALT, ALKPHOS, BILITOT, PROT, ALBUMIN in the last 72 hours. No results for input(s): LIPASE, AMYLASE in the last 72 hours. Cardiac Enzymes No results for input(s): CKTOTAL, CKMB, CKMBINDEX, TROPONINI in the last 72 hours. BNP Invalid input(s): POCBNP D-Dimer No results for input(s): DDIMER in the last 72 hours. Hemoglobin A1C No results for input(s): HGBA1C in the last 72 hours. Fasting Lipid Panel No results for input(s): CHOL, HDL, LDLCALC, TRIG, CHOLHDL, LDLDIRECT in the last 72 hours. Thyroid Function Tests No results for input(s): TSH, T4TOTAL, T3FREE, THYROIDAB in the last 72 hours.  Invalid input(s): FREET3  Dg Chest 2 View  Result Date: 05/22/2018 CLINICAL DATA:  Preoperative examination prior to aortic valve replacement. No current complaints. History of coronary artery stent placement, aortic stenosis, former smoker. EXAM: CHEST - 2 VIEW COMPARISON:  Chest x-ray of Feb 12, 2016 and chest CT scan of May 16, 2018. FINDINGS: The lungs are mildly hyperinflated with hemidiaphragm flattening. There is no focal infiltrate. The heart and pulmonary vascularity are normal. The mediastinum is normal in width. There is a small amount of calcification at the right pulmonary apex which is stable. The ICD is in stable position. There is calcification in the wall of the thoracic aorta. There is mitral annular calcification. There is multilevel degenerative disc disease of the thoracic spine. IMPRESSION: Mild chronic bronchitic-smoking related changes. No acute pneumonia nor CHF. Thoracic aortic atherosclerosis. Electronically Signed   By: David  Martinique M.D.   On: 05/22/2018 09:03   Ct Coronary Morph W/cta Cor W/score W/ca W/cm &/or Wo/cm  Addendum Date: 05/17/2018   ADDENDUM REPORT: 05/17/2018 14:43 CLINICAL DATA:  84 year old male with severe aortic stenosis being evaluated for a TAVR procedure. EXAM:  Cardiac TAVR CT TECHNIQUE: The patient was scanned on a [employer]. A 120 kV retrospective scan was triggered in the descending thoracic aorta at 111 HU's. Gantry rotation speed was 250 msecs and collimation was .6 mm. No beta blockade or nitro were given. The 3D data set was reconstructed in 5% intervals of the R-R cycle. Systolic and diastolic phases were analyzed on a dedicated work station using MPR, MIP and VRT modes. The patient received 80 cc of contrast. FINDINGS: Aortic Valve: Trileaflet aortic valve with severely thickened and moderately calcified leaflets and moderate asymmetric calcifications extending into the LVOT under the non-coronary cusp. Aorta: Normal size, mild diffuse atheroma.  No dissection. Sinotubular Junction: 31 x 30 mm Ascending Thoracic Aorta: 38 x 38 mm Aortic Arch: 33 x 32 mm Descending Thoracic Aorta: 29 x 28 mm Sinus of Valsalva Measurements: Non-coronary: 34 mm Right -coronary: 34 mm Left -coronary: 35 mm Coronary Artery Height above Annulus: Left Main: 11 mm Right  Coronary: 17 mm Virtual Basal Annulus Measurements: Maximum/Minimum Diameter: 25.3 x 21.2 mm Mean Diameter: 21.5 mm Perimeter: 70.9 mm Area: 363 mm2 Optimum Fluoroscopic Angle for Delivery: LAO 5 CAU 3. IMPRESSION: 1. Trileaflet aortic valve with severely thickened and moderately calcified leaflets and moderate asymmetric calcifications extending into the LVOT under the non-coronary cusp. Annular measurements suitable for delivery of a 23 mm Edwards-SAPIEN 3 valve. 2. Sufficient coronary to annulus distance. 3. Optimum Fluoroscopic Angle for Delivery:  LAO 5 CAU 3. 4. No thrombus in the left atrial appendage. 5.  PM leads seen in the right atrium and right ventricle. Electronically Signed   By: Ena Dawley   On: 05/17/2018 14:43   Result Date: 05/17/2018 EXAM: OVER-READ INTERPRETATION  CT CHEST The following report is an over-read performed by radiologist Dr. Salvatore Marvel of [facility], [place] on  05/16/2018. This over-read does not include interpretation of cardiac or coronary anatomy or pathology. The CTA interpretation by the cardiologist is attached. COMPARISON:  02/12/2016 chest radiograph. FINDINGS: Please see the separate concurrent chest CT angiogram report for details. IMPRESSION: Please see the separate concurrent chest CT angiogram report for details. Electronically Signed: By: Ilona Sorrel M.D. On: 05/16/2018 17:29   Dg Chest Port 1 View  Result Date: 05/30/2018 CLINICAL DATA:  TAVR. EXAM: PORTABLE CHEST 1 VIEW COMPARISON:  Chest x-ray dated May 22, 2018. FINDINGS: New right internal jugular central venous catheter with the tip at the cavoatrial junction. Interval TAVR. Unchanged left chest wall pacemaker. The heart size and mediastinal contours are within normal limits. Normal pulmonary vascularity. No focal consolidation, pleural effusion, or pneumothorax. No acute osseous abnormality. IMPRESSION: Interval TAVR.  No active disease. Electronically Signed   By: Titus Dubin M.D.   On: 05/30/2018 19:36   Ct Angio Chest Aorta W &/or Wo Contrast  Result Date: 05/16/2018 CLINICAL DATA:  Severe symptomatic aortic stenosis. Pre-TAVR evaluation. Atrial fibrillation. Complete heart block status post permanent pacemaker placement. EXAM: CT ANGIOGRAPHY CHEST, ABDOMEN AND PELVIS TECHNIQUE: Multidetector CT imaging through the chest, abdomen and pelvis was performed using the standard protocol during bolus administration of intravenous contrast. Multiplanar reconstructed images and MIPs were obtained and reviewed to evaluate the vascular anatomy. CONTRAST:  170m ISOVUE-370 IOPAMIDOL (ISOVUE-370) INJECTION 76% COMPARISON:  02/12/2016 chest radiograph. FINDINGS: CTA CHEST FINDINGS Cardiovascular: Mild cardiomegaly. No significant pericardial effusion/thickening. Left anterior descending and left circumflex coronary atherosclerosis. Severely thickened and coarsely calcified aortic valve. Two lead  left subclavian pacemaker is noted with lead tips in the right atrium and right ventricular apex. Atherosclerotic nonaneurysmal thoracic aorta. Normal caliber pulmonary arteries. No central pulmonary emboli. Mediastinum/Nodes: Heterogeneous partially calcified 3.5 cm posterior right thyroid lobe nodule. Unremarkable esophagus. No pathologically enlarged axillary, mediastinal or hilar lymph nodes. Lungs/Pleura: No pneumothorax. No pleural effusion. Subpleural right middle lobe solid pulmonary nodule with average diameter 5 mm associated with the minor fissure (series 15/image 54). No acute consolidative airspace disease, lung masses or additional significant pulmonary nodules. Musculoskeletal: No aggressive appearing focal osseous lesions. Moderate thoracic spondylosis. CTA ABDOMEN AND PELVIS FINDINGS Hepatobiliary: Normal liver size. Hypodense 0.6 cm right liver dome lesion (series 14/image 81), for which no follow-up is required unless the patient has risk factors for liver malignancy. No additional liver lesions. Cholelithiasis. No biliary ductal dilatation. Pancreas: Normal, with no mass or duct dilation. Spleen: Normal size. No mass. Adrenals/Urinary Tract: Normal adrenals. Simple 4.5 cm posterior lower right renal cyst. Simple 4.4 cm anterior interpolar left renal cyst. No hydronephrosis. Prominently distended urinary  bladder (bladder dimensions 22.2 x 11.5 x 17.5 cm (volume = 2340 cm^3)). Stomach/Bowel: Normal non-distended stomach. Normal caliber small bowel with no small bowel wall thickening. Normal appendix. Normal large bowel with no diverticulosis, large bowel wall thickening or pericolonic fat stranding. Vascular/Lymphatic: Atherosclerotic nonaneurysmal abdominal aorta. Patent renal veins. No pathologically enlarged lymph nodes in the abdomen or pelvis. Reproductive: Mildly to moderately enlarged prostate. Other: No pneumoperitoneum, ascites or focal fluid collection. Musculoskeletal: No aggressive  appearing focal osseous lesions. Marked lumbar spondylosis. VASCULAR MEASUREMENTS PERTINENT TO TAVR: AORTA: Minimal Aortic Diameter-13.0 x 12.2 mm (infrarenal abdominal aorta on series 14/image 137) Severity of Aortic Calcification-moderate to severe RIGHT PELVIS: Right Common Iliac Artery - Minimal Diameter-10.5 x 9.9 mm Tortuosity-moderate Calcification-moderate Right External Iliac Artery - Minimal Diameter-7.9 x 7.5 mm Tortuosity-moderate Calcification-none Right Common Femoral Artery - Minimal Diameter-8.6 x 5.8 mm Tortuosity-mild Calcification-moderate LEFT PELVIS: Left Common Iliac Artery - Minimal Diameter-11.2 x 8.8 mm Tortuosity-moderate Calcification-moderate Left External Iliac Artery - Minimal Diameter-8.1 x 7.7 mm Tortuosity-mild Calcification-none Left Common Femoral Artery - Minimal Diameter-9.1 x 7.4 mm Tortuosity-mild Calcification-moderate Review of the MIP images confirms the above findings. IMPRESSION: 1. Markedly distended urinary bladder, presumably due to chronic bladder outlet obstruction by the enlarged prostate. No hydronephrosis. Please ensure clinically that there is no acute urinary retention. 2. Vascular findings and measurements pertinent to potential TAVR procedure, as detailed above. 3. Severe thickening and calcification of the aortic valve, compatible with the reported clinical history of severe aortic stenosis. 4. Mild cardiomegaly. 5. Two-vessel coronary atherosclerosis. 6. Heterogeneous partially calcified 3.5 cm posterior right thyroid lobe nodule. Thyroid ultrasound is indicated for further evaluation. This follows ACR consensus guidelines: Managing Incidental Thyroid Nodules Detected on Imaging: White Paper of the ACR Incidental Thyroid Findings Committee. J Am Coll Radiol 2015; 12:143-150. 7. Subpleural 5 mm right middle lobe solid pulmonary nodule. No follow-up needed if patient is low-risk. Non-contrast chest CT can be considered in 12 months if patient is high-risk. This  recommendation follows the consensus statement: Guidelines for Management of Incidental Pulmonary Nodules Detected on CT Images:From the Fleischner Society 2017; published online before print (10.1148/radiol.[id number]). 8. Cholelithiasis. 9. Aortic Atherosclerosis (ICD10-I70.0). These results will be called to the ordering clinician or representative by the Radiologist Assistant, and communication documented in the PACS or zVision Dashboard. Electronically Signed   By: Ilona Sorrel M.D.   On: 05/16/2018 17:28   Ct Angio Abd/pel W/ And/or W/o  Result Date: 05/16/2018 CLINICAL DATA:  Severe symptomatic aortic stenosis. Pre-TAVR evaluation. Atrial fibrillation. Complete heart block status post permanent pacemaker placement. EXAM: CT ANGIOGRAPHY CHEST, ABDOMEN AND PELVIS TECHNIQUE: Multidetector CT imaging through the chest, abdomen and pelvis was performed using the standard protocol during bolus administration of intravenous contrast. Multiplanar reconstructed images and MIPs were obtained and reviewed to evaluate the vascular anatomy. CONTRAST:  176m ISOVUE-370 IOPAMIDOL (ISOVUE-370) INJECTION 76% COMPARISON:  02/12/2016 chest radiograph. FINDINGS: CTA CHEST FINDINGS Cardiovascular: Mild cardiomegaly. No significant pericardial effusion/thickening. Left anterior descending and left circumflex coronary atherosclerosis. Severely thickened and coarsely calcified aortic valve. Two lead left subclavian pacemaker is noted with lead tips in the right atrium and right ventricular apex. Atherosclerotic nonaneurysmal thoracic aorta. Normal caliber pulmonary arteries. No central pulmonary emboli. Mediastinum/Nodes: Heterogeneous partially calcified 3.5 cm posterior right thyroid lobe nodule. Unremarkable esophagus. No pathologically enlarged axillary, mediastinal or hilar lymph nodes. Lungs/Pleura: No pneumothorax. No pleural effusion. Subpleural right middle lobe solid pulmonary nodule with average diameter 5 mm  associated with the minor fissure (series  15/image 54). No acute consolidative airspace disease, lung masses or additional significant pulmonary nodules. Musculoskeletal: No aggressive appearing focal osseous lesions. Moderate thoracic spondylosis. CTA ABDOMEN AND PELVIS FINDINGS Hepatobiliary: Normal liver size. Hypodense 0.6 cm right liver dome lesion (series 14/image 81), for which no follow-up is required unless the patient has risk factors for liver malignancy. No additional liver lesions. Cholelithiasis. No biliary ductal dilatation. Pancreas: Normal, with no mass or duct dilation. Spleen: Normal size. No mass. Adrenals/Urinary Tract: Normal adrenals. Simple 4.5 cm posterior lower right renal cyst. Simple 4.4 cm anterior interpolar left renal cyst. No hydronephrosis. Prominently distended urinary bladder (bladder dimensions 22.2 x 11.5 x 17.5 cm (volume = 2340 cm^3)). Stomach/Bowel: Normal non-distended stomach. Normal caliber small bowel with no small bowel wall thickening. Normal appendix. Normal large bowel with no diverticulosis, large bowel wall thickening or pericolonic fat stranding. Vascular/Lymphatic: Atherosclerotic nonaneurysmal abdominal aorta. Patent renal veins. No pathologically enlarged lymph nodes in the abdomen or pelvis. Reproductive: Mildly to moderately enlarged prostate. Other: No pneumoperitoneum, ascites or focal fluid collection. Musculoskeletal: No aggressive appearing focal osseous lesions. Marked lumbar spondylosis. VASCULAR MEASUREMENTS PERTINENT TO TAVR: AORTA: Minimal Aortic Diameter-13.0 x 12.2 mm (infrarenal abdominal aorta on series 14/image 137) Severity of Aortic Calcification-moderate to severe RIGHT PELVIS: Right Common Iliac Artery - Minimal Diameter-10.5 x 9.9 mm Tortuosity-moderate Calcification-moderate Right External Iliac Artery - Minimal Diameter-7.9 x 7.5 mm Tortuosity-moderate Calcification-none Right Common Femoral Artery - Minimal Diameter-8.6 x 5.8 mm  Tortuosity-mild Calcification-moderate LEFT PELVIS: Left Common Iliac Artery - Minimal Diameter-11.2 x 8.8 mm Tortuosity-moderate Calcification-moderate Left External Iliac Artery - Minimal Diameter-8.1 x 7.7 mm Tortuosity-mild Calcification-none Left Common Femoral Artery - Minimal Diameter-9.1 x 7.4 mm Tortuosity-mild Calcification-moderate Review of the MIP images confirms the above findings. IMPRESSION: 1. Markedly distended urinary bladder, presumably due to chronic bladder outlet obstruction by the enlarged prostate. No hydronephrosis. Please ensure clinically that there is no acute urinary retention. 2. Vascular findings and measurements pertinent to potential TAVR procedure, as detailed above. 3. Severe thickening and calcification of the aortic valve, compatible with the reported clinical history of severe aortic stenosis. 4. Mild cardiomegaly. 5. Two-vessel coronary atherosclerosis. 6. Heterogeneous partially calcified 3.5 cm posterior right thyroid lobe nodule. Thyroid ultrasound is indicated for further evaluation. This follows ACR consensus guidelines: Managing Incidental Thyroid Nodules Detected on Imaging: White Paper of the ACR Incidental Thyroid Findings Committee. J Am Coll Radiol 2015; 12:143-150. 7. Subpleural 5 mm right middle lobe solid pulmonary nodule. No follow-up needed if patient is low-risk. Non-contrast chest CT can be considered in 12 months if patient is high-risk. This recommendation follows the consensus statement: Guidelines for Management of Incidental Pulmonary Nodules Detected on CT Images:From the Fleischner Society 2017; published online before print (10.1148/radiol.4431540086). 8. Cholelithiasis. 9. Aortic Atherosclerosis (ICD10-I70.0). These results will be called to the ordering clinician or representative by the Radiologist Assistant, and communication documented in the PACS or zVision Dashboard. Electronically Signed   By: Ilona Sorrel M.D.   On: 05/16/2018 17:28      Diagnostic Studies/Procedures    TAVR OPERATIVE NOTE   Date of Procedure:05/30/2018  Preoperative Diagnosis:Severe Aortic Stenosis   Procedure:   Transcatheter Aortic Valve Replacement - Transfemoral Approach Edwards Sapien 3 THV (size 74m, model # [id number] serial #[id number]  Co-Surgeons:Dellis Voght MAngelena Form MD and CValentina Gu ORoxy Manns MD   Pre-operative Echo Findings: ? Severe aortic stenosis ? Normalleft ventricular systolic function  Post-operative Echo Findings: ? Trivial to mildparavalvular leak ? Normalleft ventricular systolic function  ______________  2D ECHO 12/01/17 Study Conclusions - Left ventricle: The cavity size was normal. There was severe concentric hypertrophy. Systolic function was vigorous. The estimated ejection fraction was in the range of 65% to 70%. Wall motion was normal; there were no regional wall motion abnormalities. The study is not technically sufficient to allow evaluation of LV diastolic function. Doppler parameters are consistent with high ventricular filling pressure. - Aortic valve: S/P TAVR well functioning with no obvious perivalvular AR. Mean gradient (S): 14 mm Hg. Valve area (VTI): 1.86 cm^2. Valve area (Vmax): 1.65 cm^2. Valve area (Vmean): 1.99 cm^2. - Mitral valve: Severely calcified annulus. There was mild regurgitation. Valve area by pressure half-time: 1.64 cm^2. - Left atrium: The atrium was moderately dilated. - Right ventricle: Systolic function was mildly reduced. - Right atrium: The atrium was moderately dilated. - Pulmonary arteries: PA peak pressure: 41 mm Hg (S). Impressions: - Severe LVH, normal LVF with EF 65-70%, Insufficient to assess diastolic function, mild TR, moderate biatrial enlargement, mildly reduced RFV, severe MAC with mild MR. S/P TAVR with mean AVG 24mHg and no perivalvular AI.  Compared to prior echo, TAVR is now present. The right ventricular systolic pressure was increased consistent with moderate pulmonary hypertension.   Disposition   Pt is being discharged home today in good condition.  Follow-up Plans & Appointments    Follow-up Information    TEileen Stanford PA-C. Go on 06/08/2018.   Specialties:  Cardiology, Radiology Why:  @ 1:30pm.  Contact information: [phone]           Discharge Medications     Medication List    TAKE these medications   apixaban 5 MG Tabs tablet Commonly known as:  ELIQUIS Take 1 tablet (5 mg total) by mouth 2 (two) times daily.   aspirin 81 MG chewable tablet Chew 1 tablet (81 mg total) by mouth daily.   atorvastatin 40 MG tablet Commonly known as:  LIPITOR Take 1 tablet (40 mg total) by mouth daily.   donepezil 10 MG tablet Commonly known as:  ARICEPT Take 1 tablet (10 mg total) by mouth at bedtime.   ipratropium 0.06 % nasal spray Commonly known as:  ATROVENT Place 1-2 sprays into both nostrils 4 (four) times daily as needed for rhinitis.   memantine 10 MG tablet Commonly known as:  NAMENDA Take 1 tablet (10 mg total) by mouth 2 (two) times daily.   METANX 3-90.314-2-35 MG Caps Take 1 capsule by mouth 2 (two) times daily.   QUEtiapine 25 MG tablet Commonly known as:  SEROQUEL Take 1 tablet (25 mg total) by mouth at bedtime.   sertraline 50 MG tablet Commonly known as:  ZOLOFT Take 50 mg by mouth daily.   vitamin C 500 MG tablet Commonly known as:  ASCORBIC ACID Take 500 mg by mouth daily.   Vitamin D-3 1000 units Caps Take 1,000 Units by mouth daily.         Outstanding Labs/Studies   none  Duration of Discharge Encounter   Greater than 30 minutes including physician time.  Signed, KAngelena FormPA-C 06/01/2018, 9:59 AM   I have personally seen and examined this patient. I agree with the assessment and  plan as outlined above.  He is doing well today two days post TAVR. BP stable. No complaints. Bilateral groins ok, no bleeding or hematoma. Echo yesterday with no PVL. Discharge home today on ASA and Eliquis.   CLauree Chandler8/29/2019  12:34 PM

## 2018-06-01 NOTE — Progress Notes (Signed)
Patient is ready for discharge. He has all of his belongings. He has had all of his questions regarding his discharge answered. His Central Line and Peripheral IV have both been removed without complications. He has been removed from telemetry and CCMD has been notified. He is alert and oriented. He will be transported home by his wife and his daughter  Who are here with him. He will leave the unit by wheelchair and meet his daughter at the front entrance of the hospital.

## 2018-06-01 NOTE — Care Management Note (Signed)
Case Management Note Marvetta Gibbons RN, BSN Unit 4E- RN Care Coordinator  [phone]  Patient Details  Name: Jeffery Rogers MRN: [id number] Date of Birth: 09-23-1932  Subjective/Objective:  Pt admitted s/p TAVR                  Action/Plan: PTA pt lived at home, plan to transition back home post procedure- no CM needs noted for return home.   Expected Discharge Date:  06/01/18               Expected Discharge Plan:  Home/Self Care  In-House Referral:  NA  Discharge planning Services  CM Consult  Post Acute Care Choice:  NA Choice offered to:  NA  DME Arranged:    DME Agency:     HH Arranged:    HH Agency:     Status of Service:  Completed, signed off  If discussed at [place] of Stay Meetings, dates discussed:    Discharge Disposition: home/self care   Additional Comments:  Dawayne Patricia, RN 06/01/2018, 10:26 AM

## 2018-06-01 NOTE — Plan of Care (Signed)
  Problem: Education: Goal: Knowledge of General Education information will improve Description Including pain rating scale, medication(s)/side effects and non-pharmacologic comfort measures 06/01/2018 0643 by Drenda Freeze, RN Outcome: Progressing 06/01/2018 0642 by Drenda Freeze, RN Outcome: Progressing   Problem: Health Behavior/Discharge Planning: Goal: Ability to manage health-related needs will improve 06/01/2018 0643 by Drenda Freeze, RN Outcome: Progressing 06/01/2018 0642 by Drenda Freeze, RN Outcome: Progressing   Problem: Clinical Measurements: Goal: Ability to maintain clinical measurements within normal limits will improve Outcome: Progressing

## 2018-06-02 ENCOUNTER — Telehealth (HOSPITAL_COMMUNITY): Payer: Self-pay

## 2018-06-02 ENCOUNTER — Telehealth: Payer: Self-pay | Admitting: Physician Assistant

## 2018-06-02 NOTE — Telephone Encounter (Signed)
Called and spoke with wife of patient in regards to Cardiac Rehab. Candy stated patient is interested in participating. Explained scheduling process, went over insurance with her and she verbalized understanding. Will contact patient for scheduling once follow up appt has been completed.

## 2018-06-02 NOTE — Telephone Encounter (Signed)
  [place] VALVE TEAM    Patient contacted regarding discharge from [facility] on 8/29  Patient understands to follow up with provider Nell Range on 9/5 at [facility].  Patient understands discharge instructions? yes Patient understands medications and regiment? yes Patient understands to bring all medications to this visit? yes  He has already been up walking and now sitting on the porch enjoying the weather.   Angelena Form PA-C  [facility]

## 2018-06-02 NOTE — Telephone Encounter (Signed)
Patients insurance is active and benefits verified through Medicare A/B - No co-pay, deductible amount of $185.00/$185.00 has been met, no out of pocket, 20% co-insurance, and no pre-authorization is required. Passport/reference [phone]  Patients insurance is active and benefits verified through [employer] - No co-pay, no deductible, no out of pocket, no co-insurance, and no pre-authorization is required. Passport/reference [phone]  Will contact patient in regards to Cardiac Rehab to see if he is interested in the Cardiac Rehab program. If interested, patient will need to complete follow up appt. Once completed, patient will be contacted for scheduling.

## 2018-06-07 ENCOUNTER — Encounter: Payer: Self-pay | Admitting: Physician Assistant

## 2018-06-08 ENCOUNTER — Encounter: Payer: Self-pay | Admitting: Physician Assistant

## 2018-06-08 ENCOUNTER — Ambulatory Visit (INDEPENDENT_AMBULATORY_CARE_PROVIDER_SITE_OTHER): Payer: Medicare Other | Admitting: Physician Assistant

## 2018-06-08 VITALS — BP 154/80 | HR 74 | Ht 71.0 in | Wt 179.4 lb

## 2018-06-08 DIAGNOSIS — I481 Persistent atrial fibrillation: Secondary | ICD-10-CM | POA: Diagnosis not present

## 2018-06-08 DIAGNOSIS — Z950 Presence of cardiac pacemaker: Secondary | ICD-10-CM

## 2018-06-08 DIAGNOSIS — F0390 Unspecified dementia without behavioral disturbance: Secondary | ICD-10-CM

## 2018-06-08 DIAGNOSIS — Z952 Presence of prosthetic heart valve: Secondary | ICD-10-CM | POA: Diagnosis not present

## 2018-06-08 DIAGNOSIS — I4819 Other persistent atrial fibrillation: Secondary | ICD-10-CM

## 2018-06-08 MED ORDER — AMOXICILLIN 500 MG PO TABS: ORAL_TABLET | ORAL | 6 refills | Status: DC

## 2018-06-08 NOTE — Progress Notes (Signed)
[facility] [place]                                       Cardiology Office Note    Date:  06/08/2018   ID:  Jeffery Rogers, DOB Mar 29, 1932, MRN [id number]  PCP:  [name], MD  Cardiologist:  Dr. Lovena Le / Dr. Angelena Form & Dr. Roxy Manns (TAVR)  CC: [facility] s/p TAVR  History of Present Illness:  Jeffery Rogers is a 84 y.o. male with a history of persistent atrial fibrillation on Eliquis, CHB s/p PPM (2017), dementia, hearing loss and severe AS s/p TAVR (05/30/18) who presents to clinic for follow up.   He had an echo in May 2017 which showed a trileaflet aortic valve with severe thickening and calcification and restricted leaflet mobility with a mean gradient of 34 mmHg consistent with moderate aortic stenosis. He had a follow-up echocardiogram on 03/24/2018 which showed progression to severe aortic stenosis with a mean gradient of 45 mmHg and a peak gradient of 68 mmHg. Left ventricular ejection fraction was 60 to 65%. He is here with his wife today. He says that he is always been very active and used to ride his road bike 60 to 75 miles per week but quit about 3 years ago at his family's insistent due to some memory loss. He says that he is not as active as he used to be a year ago but denies any shortness of breath or fatigue with his usual daily activity. His wife said that he does complain of some shortness of breath when he exerts himself and has been tired towards the end of the day. [facility] 05/03/18 showed mild non obst CAD.  He underwent successful TAVR with a63m Edwards Sapien 3 THV via the TF approach on 05/30/18. Post operative echoshowed EF 65-70%, normally functioning valve with no obvious PVL and mean gradient 14 mm Hg. He was discharged on ASA and Eliquis 2.553mdaily.   Today he presents to clinic for follow up. Here with wife and daughter. He has been doing quite well. He has been walking around with no issues. He can tell a difference  in his breathing. His wife and daughter can really tell a difference in how he looks and feels. He deniest CP or SOB. No LE edema, orthopnea or PND.He sometimes gets some orthostatic dizziness which is not new but no syncope. No blood in stool or urine. No palpitations. They have also noticed a rash on his right leg that is asymptomatic as well as some bruising on his arms. We spent a long time talking about what activities he can resume.    Past Medical History:  Diagnosis Date  . Arthritis    "right arm/shoulder" (02/10/2016)  . Complete heart block ([place]   a. s/p BoEmergency planning/management officer. Goiter   . Hypercholesteremia   . Memory loss   . Nasal septal deviation   . Persistent atrial fibrillation ([place]  . Presence of permanent cardiac pacemaker   . 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 Severe aortic stenosis   . Tremor     Past Surgical History:  Procedure Laterality Date  . CATARACT EXTRACTION W/ INTRAOCULAR LENS  IMPLANT, BILATERAL Bilateral ~ 2000  .  EP IMPLANTABLE DEVICE N/A 02/11/2016   Procedure: Pacemaker Implant;  Surgeon: Stringfellow Lance, MD;  Location: [place] CV [facility];  Service: Cardiovascular;  Laterality: N/A;  . RIGHT/LEFT HEART CATH AND CORONARY ANGIOGRAPHY N/A 05/03/2018   Procedure: RIGHT/LEFT HEART CATH AND CORONARY ANGIOGRAPHY;  Surgeon: Burnell Blanks, MD;  Location: [place] [facility];  Service: Cardiovascular;  Laterality: N/A;  . TEE WITHOUT CARDIOVERSION N/A 05/30/2018   Procedure: TRANSESOPHAGEAL ECHOCARDIOGRAM (TEE);  Surgeon: Burnell Blanks, MD;  Location: [place];  Service: Open Heart Surgery;  Laterality: N/A;  . TONSILLECTOMY  1930s  . TRANSCATHETER AORTIC VALVE REPLACEMENT, TRANSFEMORAL N/A 05/30/2018   Procedure: TRANSCATHETER AORTIC VALVE REPLACEMENT, TRANSFEMORAL using a 40m Edwards Sapien 3 Aortic  Valve;  Surgeon: MBurnell Blanks MD;  [place]  Service: Open Heart Surgery;  Laterality: N/A;  . WISDOM TOOTH EXTRACTION  1990s    Current Medications: Outpatient Medications Prior to Visit  Medication Sig Dispense Refill  . apixaban (ELIQUIS) 5 MG TABS tablet Take 1 tablet (5 mg total) by mouth 2 (two) times daily. 180 tablet 3  . aspirin 81 MG chewable tablet Chew 1 tablet (81 mg total) by mouth daily.    .Marland Kitchenatorvastatin (LIPITOR) 40 MG tablet Take 1 tablet (40 mg total) by mouth daily. 90 tablet 2  . Cholecalciferol (VITAMIN D-3) 1000 units CAPS Take 1,000 Units by mouth daily.     .Marland Kitchendonepezil (ARICEPT) 10 MG tablet Take 1 tablet (10 mg total) by mouth at bedtime. 90 tablet 4  . ipratropium (ATROVENT) 0.06 % nasal spray Place 1-2 sprays into both nostrils 4 (four) times daily as needed for rhinitis.    .Marland KitchenL-Methylfolate-Algae-B12-B6 (METANX) 3-90.314-2-35 MG CAPS Take 1 capsule by mouth 2 (two) times daily. 180 capsule 4  . memantine (NAMENDA) 10 MG tablet Take 1 tablet (10 mg total) by mouth 2 (two) times daily. 180 tablet 4  . QUEtiapine (SEROQUEL) 25 MG tablet Take 1 tablet (25 mg total) by mouth at bedtime. 90 tablet 3  . sertraline (ZOLOFT) 50 MG tablet Take 50 mg by mouth daily.     . vitamin C (ASCORBIC ACID) 500 MG tablet Take 500 mg by mouth daily.     No facility-administered medications prior to visit.      Allergies:   Patient has no known allergies.   Social History   Socioeconomic History  . Marital status: Married    Spouse name: Not on file  . Number of children: 2  . Years of education: Bachelors  . Highest education level: Not on file  Occupational History  . 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 Financial resource strain: Not on file  . Food insecurity:    Worry: Not on file    Inability: Not on file  . Transportation needs:    Medical: Not on file    Non-medical: Not on file  Tobacco Use  . Smoking status: Former Smoker     Packs/day: 1.00    Years: 16.00    Pack years: 16.00    Types: Cigarettes  . Smokeless tobacco: Never Used  . Tobacco comment: Quit smoking cigarettes in 1971  Substance and Sexual Activity  . Alcohol use: Never    Alcohol/week: 2.0 standard drinks    Types: 1 Cans of beer, 1 Shots of liquor per week    Frequency: Never  . Drug use: No  . Sexual activity: Yes  Lifestyle  . Physical activity:  Days per week: Not on file    Minutes per session: Not on file  . Stress: Not on file  Relationships  . Social connections:    Talks on phone: Not on file    Gets together: Not on file    Attends religious service: Not on file    Active member of club or organization: Not on file    Attends meetings of clubs or organizations: Not on file    Relationship status: Not on file  Other Topics Concern  . Not on file  Social History Narrative   Lives at home with his wife.   Right-handed.   1 cup coffee per day.  Occasional Coke.     Family History:  The patient's family history includes CAD in his father; Irregular heart beat in his unknown relative; Stroke in his father, mother, and unknown relative.      ROS:   Please see the history of present illness.    ROS All other systems reviewed and are negative.   PHYSICAL EXAM:   VS:  BP (!) 154/80   Pulse 74   Ht _0  (1.803 m)   Wt 179 lb 6.4 oz (81.4 kg)   SpO2 99%   BMI 25.02 kg/m    GEN: Well nourished, well developed, in no acute distress  HEENT: normal  Neck: no JVD, carotid bruits, or masses Cardiac: RRR; soft flow murmur. no rubs, or gallops,no edema  Respiratory:  clear to auscultation bilaterally, normal work of breathing GI: soft, nontender, nondistended, + BS MS: no deformity or atrophy  Skin: warm and dry, no rash. Diffuse asymptomatic rash on right leg, bruises on upper extremities. Groin sites are stable. Neuro:  Alert and Oriented x 3, Strength and sensation are intact Psych: euthymic mood, full affect  Wt  Readings from Last 3 Encounters:  06/08/18 179 lb 6.4 oz (81.4 kg)  06/01/18 183 lb 13.8 oz (83.4 kg)  05/22/18 177 lb 12.8 oz (80.6 kg)      Studies/Labs Reviewed:   EKG:  EKG is NOT ordered today.    Recent Labs: 05/22/2018: ALT 29; B Natriuretic Peptide 277.4 05/31/2018: Magnesium 2.1 06/01/2018: BUN 11; Creatinine, Ser 0.97; Hemoglobin 12.5; Platelets 93; Potassium 3.7; Sodium 138   Lipid Panel No results found for: CHOL, TRIG, HDL, CHOLHDL, VLDL, LDLCALC, LDLDIRECT  Additional studies/ records that were reviewed today include:  TAVR OPERATIVE NOTE   Date of Procedure:05/30/2018  Preoperative Diagnosis:Severe Aortic Stenosis   Procedure:   Transcatheter Aortic Valve Replacement - Transfemoral Approach Edwards Sapien 3 THV (size 76m, model # [id number] serial #[id number]  Co-Surgeons:Christopher MAngelena Form MD and CValentina Gu ORoxy Manns MD   Pre-operative Echo Findings: ? Severe aortic stenosis ? Normalleft ventricular systolic function  Post-operative Echo Findings: ? Trivial to mildparavalvular leak ? Normalleft ventricular systolic function  ______________   2D ECHO 12/01/17 Study Conclusions - Left ventricle: The cavity size was normal. There was severe concentric hypertrophy. Systolic function was vigorous. The estimated ejection fraction was in the range of 65% to 70%. Wall motion was normal; there were no regional wall motion abnormalities. The study is not technically sufficient to allow evaluation of LV diastolic function. Doppler parameters are consistent with high ventricular filling pressure. - Aortic valve: S/P TAVR well functioning with no obvious perivalvular AR. Mean gradient (S): 14 mm Hg. Valve Rogers (VTI): 1.86 cm^2. Valve Rogers (Vmax): 1.65 cm^2. Valve Rogers (Vmean): 1.99 cm^2. - Mitral valve: Severely calcified annulus. There was mild regurgitation.  Valve  Rogers by pressure half-time: 1.64 cm^2. - Left atrium: The atrium was moderately dilated. - Right ventricle: Systolic function was mildly reduced. - Right atrium: The atrium was moderately dilated. - Pulmonary arteries: PA peak pressure: 41 mm Hg (S). Impressions: - Severe LVH, normal LVF with EF 65-70%, Insufficient to assess diastolic function, mild TR, moderate biatrial enlargement, mildly reduced RFV, severe MAC with mild MR. S/P TAVR with mean AVG 16mHg and no perivalvular AI. Compared to prior echo, TAVR is now present. The right ventricular systolic pressure was increased consistent with moderate pulmonary hypertension.   ASSESSMENT & PLAN:   Severe AS s/p TAVR:doing excellent. Groin sites are healing well. He was cleared to resume normal activities including his stationary bike. He has a diffuse asymptomatic rash on his right leg likely 2/2 atheroemboli from catheter manipulation. He will continue on ASA and Eliquis. SBE prophylaxis was discussed and Amoxicillin called into his pharmacy. I will see him back at 1 month for echo and follow up in valve clinic  Persistent atrial fibrillation: rate well controlled. Continue Eliquis  S/p PPM: Followed by Dr. TLovena Le   Dementia: stable. Continue current regimen  Elevated BP: he doesn't have a history of HTN, but BP mildly elevated today. They will keep a log of pressures at home and let uKoreaknow is consisently running over 140/90.  Medication Adjustments/Labs and Tests Ordered: Current medicines are reviewed at length with the patient today.  Concerns regarding medicines are outlined above.  Medication changes, Labs and Tests ordered today are listed in the Patient Instructions below. Patient Instructions  Medication Instructions:  Your physician discussed the importance of taking an antibiotic prior to any dental, gastrointestinal, genitourinary procedures to prevent damage to the heart valves from infection. You  were given a prescription for an antibiotic based on current SBE prophylaxis guidelines. We have called in a prescription for AMOXIL 2,000 mg to take one hour prior to dental visits.   Labwork: None  Testing/Procedures: Please keep your upcoming echocardiogram appointment.  Follow-Up: Please keep your upcoming appointments for your echocardiogram and office visit on 07/12/2018. Please arrive by 1:30PM.   Any Other Special Instructions Will Be Listed Below (If Applicable). Please check your blood pressure daily (make sure you check it at least 1-2 hours after taking your medications). Please call [email or web address] to your next appointment if your blood pressure is consistently above 140/90.    Signed, KAngelena Form PA-C  06/08/2018 3:01 PM    [facility] [address] [place]  [id number]: [phone] Fax: [phone]

## 2018-06-08 NOTE — Patient Instructions (Addendum)
Medication Instructions:  Your physician discussed the importance of taking an antibiotic prior to any dental, gastrointestinal, genitourinary procedures to prevent damage to the heart valves from infection. You were given a prescription for an antibiotic based on current SBE prophylaxis guidelines. We have called in a prescription for AMOXIL 2,000 mg to take one hour prior to dental visits.   Labwork: None  Testing/Procedures: Please keep your upcoming echocardiogram appointment.  Follow-Up: Please keep your upcoming appointments for your echocardiogram and office visit on 07/12/2018. Please arrive by 1:30PM.   Any Other Special Instructions Will Be Listed Below (If Applicable). Please check your blood pressure daily (make sure you check it at least 1-2 hours after taking your medications). Please call us prior to your next appointment if your blood pressure is consistently above 140/90.

## 2018-06-12 ENCOUNTER — Telehealth (HOSPITAL_COMMUNITY): Payer: Self-pay

## 2018-06-12 NOTE — Telephone Encounter (Signed)
Attempted to call patient in regards to Cardiac Rehab - LM on VM 

## 2018-06-13 ENCOUNTER — Telehealth (HOSPITAL_COMMUNITY): Payer: Self-pay

## 2018-06-13 NOTE — Telephone Encounter (Signed)
Wife of patient returned call and left message - attempted to contact patient - lm on vm

## 2018-06-14 ENCOUNTER — Telehealth (HOSPITAL_COMMUNITY): Payer: Self-pay

## 2018-06-14 ENCOUNTER — Ambulatory Visit (INDEPENDENT_AMBULATORY_CARE_PROVIDER_SITE_OTHER): Payer: Medicare Other | Admitting: *Deleted

## 2018-06-14 DIAGNOSIS — I442 Atrioventricular block, complete: Secondary | ICD-10-CM | POA: Diagnosis not present

## 2018-06-14 LAB — CUP PACEART REMOTE DEVICE CHECK
Battery Remaining Longevity: 162 mo
Brady Statistic RA Percent Paced: 0 %
Brady Statistic RV Percent Paced: 99 %
Implantable Lead Implant Date: 20170510
Implantable Lead Location: 753859
Implantable Lead Location: 753860
Implantable Lead Model: 7741
Implantable Lead Model: 7742
Implantable Pulse Generator Implant Date: 20170510
Lead Channel Impedance Value: 674 Ohm
Lead Channel Impedance Value: 798 Ohm
Lead Channel Pacing Threshold Amplitude: 0.9 V
MDC IDC LEAD IMPLANT DT: 20170510
MDC IDC LEAD SERIAL: 729262
MDC IDC LEAD SERIAL: 766721
MDC IDC MSMT BATTERY REMAINING PERCENTAGE: 100 %
MDC IDC MSMT LEADCHNL RV PACING THRESHOLD PULSEWIDTH: 0.4 ms
MDC IDC PG SERIAL: 748273
MDC IDC SESS DTM: 20190911042100
MDC IDC SET LEADCHNL RV PACING AMPLITUDE: 1.4 V
MDC IDC SET LEADCHNL RV PACING PULSEWIDTH: 0.4 ms
MDC IDC SET LEADCHNL RV SENSING SENSITIVITY: 2.5 mV

## 2018-06-14 NOTE — Telephone Encounter (Signed)
Recv'd call from pt wife [name] to get pt schedule for CR.  Patient will come in for orientation on 08/08/18 @ 8:30AM and will attend the 11:15AM exercise class.  Went over [employer], she verbalized understanding.

## 2018-06-14 NOTE — Progress Notes (Signed)
Remote pacemaker transmission.   

## 2018-06-16 DIAGNOSIS — Z974 Presence of external hearing-aid: Secondary | ICD-10-CM | POA: Diagnosis not present

## 2018-06-16 DIAGNOSIS — Z87891 Personal history of nicotine dependence: Secondary | ICD-10-CM | POA: Diagnosis not present

## 2018-06-16 DIAGNOSIS — Z7289 Other problems related to lifestyle: Secondary | ICD-10-CM | POA: Diagnosis not present

## 2018-06-16 DIAGNOSIS — H6123 Impacted cerumen, bilateral: Secondary | ICD-10-CM | POA: Diagnosis not present

## 2018-06-16 DIAGNOSIS — H9113 Presbycusis, bilateral: Secondary | ICD-10-CM | POA: Diagnosis not present

## 2018-06-23 ENCOUNTER — Encounter: Payer: Medicare Other | Admitting: Internal Medicine

## 2018-06-26 ENCOUNTER — Encounter: Payer: Self-pay | Admitting: Thoracic Surgery (Cardiothoracic Vascular Surgery)

## 2018-06-26 ENCOUNTER — Telehealth: Payer: Self-pay | Admitting: Cardiovascular Disease

## 2018-06-26 ENCOUNTER — Telehealth: Payer: Self-pay | Admitting: Nurse Practitioner

## 2018-06-26 ENCOUNTER — Ambulatory Visit (INDEPENDENT_AMBULATORY_CARE_PROVIDER_SITE_OTHER): Payer: Medicare Other | Admitting: Nurse Practitioner

## 2018-06-26 ENCOUNTER — Encounter: Payer: Self-pay | Admitting: Nurse Practitioner

## 2018-06-26 VITALS — Ht 71.0 in | Wt 178.8 lb

## 2018-06-26 DIAGNOSIS — Z952 Presence of prosthetic heart valve: Secondary | ICD-10-CM

## 2018-06-26 DIAGNOSIS — R42 Dizziness and giddiness: Secondary | ICD-10-CM | POA: Diagnosis not present

## 2018-06-26 NOTE — Telephone Encounter (Signed)
S/w pt's wife per (DPR) pt was advised to give pt a meclizine (25 mg ) 3 times prn.  Would give one dose tonight and put pt to bed.  [name] was agreeable to treatment plan.

## 2018-06-26 NOTE — Progress Notes (Signed)
CARDIOLOGY OFFICE NOTE  Date:  06/26/2018    Jeffery Rogers Date of Birth: 1932/09/18 Medical Record #[id number]  PCP:  [name], MD  Cardiologist:  [facility]  Chief Complaint  Patient presents with  . Follow-up    Work in visit for unsteadiness. Seen for Dr. Angelena Form    History of Present Illness: Jeffery Rogers is a 84 y.o. male who presents today for a work in visit. Seen for Dr. Angelena Form.   He has a history of persistent AF - on Eliquis, CHB s/p PPM (2017), dementia, hearing loss and severe AS s/p TAVR (05/30/18).   He has had progressive AS. He had developed some shortness of breath. TAVR work up was initiated. [facility] 05/03/18 showed mild non obst CAD.  He underwent successful TAVR with a69m Edwards Sapien 3 THV via the TF approach on 05/30/18. Post operative echoshowed EF 65-70%, normally functioning valve with no obvious PVL and mean gradient 14 mm Hg. He was discharged on ASA and Eliquis 2.518mdaily.   He was seen 3 weeks ago - he was doing well. Breathing had improved. He had had a rash but overall was felt to be doing ok.  Phone call today -  Received call transferred directly from operator and spoke with pt's wife. She reports pt got up this morning and was able to get dressed without problems. After being up for awhile he started feeling dizzy. Does not feel like he is going to pass out but very unsteady on feet and does not feel comfortable walking without assistance. He does sometimes have dizziness when changing positions but feels this unsteadiness is different. He does have a pacemaker and wife will send transmission. Will review with Dr. McAngelena FormThus added to my schedule for today.   PPM transmission was noted to be normal.   Comes in today. Here with wife and son in law. He is in a wheelchair. He feels unsteady. He has basically done well over the past 3 weeks since he was last here. He does have issues with his allergies - feels "stopped up". He  has had some ear wax removed last week. [place] He has been eating well. No fever or chills. No cough. No chest pain. Sleeping well. Breathing is good. He is not really doing anything in way of activity - not biking. Not going to rehab until November. Seems to have "to hold on to something or someone" since the initial surgery. Yesterday he noted a sensation of the room spinning. BP was up and then gradually came down. Got a little dizzy this morning after dressing. He has not fallen or passed. He did note a sensation of "everything spinning" yesterday.   Past Medical History:  Diagnosis Date  . Arthritis    "right arm/shoulder" (02/10/2016)  . Complete heart block ([place]06[place]   a. s/p BoEmergency planning/management officer. Goiter   . Hypercholesteremia   . Memory loss   . Nasal septal deviation   . Persistent atrial fibrillation ([place]  . Presence of permanent cardiac pacemaker   . 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 Severe aortic stenosis   . Tremor     Past Surgical History:  Procedure Laterality Date  . CATARACT EXTRACTION W/ INTRAOCULAR LENS  IMPLANT, BILATERAL Bilateral ~ 2000  . EP IMPLANTABLE DEVICE N/A  02/11/2016   Procedure: Pacemaker Implant;  Surgeon: Cedano Lance, MD;  Location: [facility];  Service: Cardiovascular;  Laterality: N/A;  . RIGHT/LEFT HEART CATH AND CORONARY ANGIOGRAPHY N/A 05/03/2018   Procedure: RIGHT/LEFT HEART CATH AND CORONARY ANGIOGRAPHY;  Surgeon: Burnell Blanks, MD;  Location: [place] [facility];  Service: Cardiovascular;  Laterality: N/A;  . TEE WITHOUT CARDIOVERSION N/A 05/30/2018   Procedure: TRANSESOPHAGEAL ECHOCARDIOGRAM (TEE);  Surgeon: Burnell Blanks, MD;  Location: [facility];  Service: Open Heart Surgery;  Laterality: N/A;  . TONSILLECTOMY  1930s  . TRANSCATHETER AORTIC VALVE  REPLACEMENT, TRANSFEMORAL N/A 05/30/2018   Procedure: TRANSCATHETER AORTIC VALVE REPLACEMENT, TRANSFEMORAL using a 57m Edwards Sapien 3 Aortic Valve;  Surgeon: MBurnell Blanks MD;  [place]  Service: Open Heart Surgery;  Laterality: N/A;  . WISDOM TOOTH EXTRACTION  1990s     Medications: Current Meds  Medication Sig  . amoxicillin (AMOXIL) 500 MG tablet Take 4 tablets (2,000 mg) one hour prior to dental visits.  .Marland Kitchenapixaban (ELIQUIS) 5 MG TABS tablet Take 1 tablet (5 mg total) by mouth 2 (two) times daily.  .Marland Kitchenaspirin 81 MG chewable tablet Chew 1 tablet (81 mg total) by mouth daily.  .Marland Kitchenatorvastatin (LIPITOR) 40 MG tablet Take 1 tablet (40 mg total) by mouth daily.  . Cholecalciferol (VITAMIN D-3) 1000 units CAPS Take 1,000 Units by mouth daily.   .Marland Kitchendonepezil (ARICEPT) 10 MG tablet Take 1 tablet (10 mg total) by mouth at bedtime.  .Marland Kitchenipratropium (ATROVENT) 0.06 % nasal spray Place 1-2 sprays into both nostrils 4 (four) times daily as needed for rhinitis.  .Marland KitchenL-Methylfolate-Algae-B12-B6 (METANX) 3-90.314-2-35 MG CAPS Take 1 capsule by mouth 2 (two) times daily.  . memantine (NAMENDA) 10 MG tablet Take 1 tablet (10 mg total) by mouth 2 (two) times daily.  . QUEtiapine (SEROQUEL) 25 MG tablet Take 1 tablet (25 mg total) by mouth at bedtime.  . sertraline (ZOLOFT) 50 MG tablet Take 50 mg by mouth daily.   . vitamin C (ASCORBIC ACID) 500 MG tablet Take 500 mg by mouth daily.     Allergies: No Known Allergies  Social History: The patient  reports that he has quit smoking. His smoking use included cigarettes. He has a 16.00 pack-year smoking history. He has never used smokeless tobacco. He reports that he does not drink alcohol or use drugs.   Family History: The patient's family history includes CAD in his father; Irregular heart beat in his unknown relative; Stroke in his father, mother, and unknown relative.   Review of Systems: Please see the history of present illness.    Otherwise, the review of systems is positive for none.   All other systems are reviewed and negative.   Physical Exam: VS:  Ht _0  (1.803 m)   Wt 178 lb 12.8 oz (81.1 kg)   BMI 24.94 kg/m  .  BMI Body mass index is 24.94 kg/m.  Wt Readings from Last 3 Encounters:  06/26/18 178 lb 12.8 oz (81.1 kg)  06/08/18 179 lb 6.4 oz (81.4 kg)  06/01/18 183 lb 13.8 oz (83.4 kg)   Lying BP 152/83 with HR 60 Sitting BP is 142/76 with HR 59 - felt little lightheaded Standing BP is 144/80 with HR 59 - felt little lightheaded  He was not able to continue to stand for 3 minutes.   General: Pleasant. Elderly. Alert and in no acute distress.   HEENT: Normal. Hard of hearing -  has bilateral aids in place.  Neck: Supple, no JVD, carotid bruits, or masses noted.  Cardiac: Regular rate and rhythm. Harsh outflow murmur with a blowing diastolic murmur noted. No edema.  Respiratory:  Lungs are clear to auscultation bilaterally with normal work of breathing.  GI: Soft and nontender.  MS: No deformity or atrophy. Gait not tested.   Skin: Warm and dry. Color is normal.  Neuro:  Strength and sensation are intact and no gross focal deficits noted.  Psych: Alert, appropriate and with normal affect.   LABORATORY DATA:  EKG:  EKG is ordered today. This demonstrates a paced rhythm. Underlying AF noted.   Lab Results  Component Value Date   WBC 7.5 06/01/2018   HGB 12.5 (L) 06/01/2018   HCT 38.0 (L) 06/01/2018   PLT 93 (L) 06/01/2018   GLUCOSE 89 06/01/2018   ALT 29 05/22/2018   AST 38 05/22/2018   NA 138 06/01/2018   K 3.7 06/01/2018   CL 106 06/01/2018   CREATININE 0.97 06/01/2018   BUN 11 06/01/2018   CO2 27 06/01/2018   TSH 1.650 01/12/2017   INR 1.02 05/22/2018   HGBA1C 5.5 05/22/2018     BNP (last 3 results) Recent Labs    05/22/18 0833  BNP 277.4*    ProBNP (last 3 results) No results for input(s): PROBNP in the last 8760 hours.   Other Studies Reviewed Today:  TAVR  OPERATIVE NOTE   Date of Procedure:05/30/2018  Preoperative Diagnosis:Severe Aortic Stenosis   Procedure:   Transcatheter Aortic Valve Replacement - Transfemoral Approach Edwards Sapien 3 THV (size 70m, model # [id number] serial #[id number]  Co-Surgeons:Christopher MAngelena Form MD and CValentina Gu ORoxy Manns MD   Pre-operative Echo Findings: ? Severe aortic stenosis ? Normalleft ventricular systolic function  Post-operative Echo Findings: ? Trivial to mildparavalvular leak ? Normalleft ventricular systolic function  ______________   2D ECHO 12/01/17 Study Conclusions - Left ventricle: The cavity size was normal. There was severe concentric hypertrophy. Systolic function was vigorous. The estimated ejection fraction was in the range of 65% to 70%. Wall motion was normal; there were no regional wall motion abnormalities. The study is not technically sufficient to allow evaluation of LV diastolic function. Doppler parameters are consistent with high ventricular filling pressure. - Aortic valve: S/P TAVR well functioning with no obvious perivalvular AR. Mean gradient (S): 14 mm Hg. Valve Rogers (VTI): 1.86 cm^2. Valve Rogers (Vmax): 1.65 cm^2. Valve Rogers (Vmean): 1.99 cm^2. - Mitral valve: Severely calcified annulus. There was mild regurgitation. Valve Rogers by pressure half-time: 1.64 cm^2. - Left atrium: The atrium was moderately dilated. - Right ventricle: Systolic function was mildly reduced. - Right atrium: The atrium was moderately dilated. - Pulmonary arteries: PA peak pressure: 41 mm Hg (S). Impressions: - Severe LVH, normal LVF with EF 65-70%, Insufficient to assess diastolic function, mild TR, moderate biatrial enlargement, mildly reduced RFV, severe MAC with mild MR. S/P TAVR with mean AVG 142mg and no perivalvular AI. Compared to prior echo, TAVR is now present. The  right ventricular systolic pressure was increased consistent with moderate pulmonary hypertension.   ASSESSMENT & PLAN:   1. Unsteadiness/dizziness - sounds multifactorial - sounds like he is deconditioned and probably with some degree of vertigo. Seeing ENT on Wednesday. Will check lab today. Offered OTC Meclizine - she wishes to hold off on adding medicines at this time. She is to monitor his BP as well.   2. Recent TAVR - worrisome murmur on exam - ?mitral stenosis -  discussed with Dr. Angelena Form - will go ahead and update his echo. He has no cardiac symptoms on exam noted.   3. Persistent AF - underlying PPM on place. On anticoagulation.   4. Chronic anticoagulation - no problems noted - remains on full dose  5. Underlying PPM - followed by EP  6. Dementia  Current medicines are reviewed with the patient today.  The patient does not have concerns regarding medicines other than what has been noted above.  The following changes have been made:  See above.  Labs/ tests ordered today include:    Orders Placed This Encounter  Procedures  . Basic metabolic panel  . CBC  . EKG 12-Lead     Disposition:   FU with Bonney Leitz, PA as planned.    Patient is agreeable to this plan and will call if any problems develop in the interim.   SignedTruitt Merle, NP  06/26/2018 3:15 PM  [facility] [address] [place], [place]  [address] Phone: [phone] Fax: [phone]

## 2018-06-26 NOTE — Telephone Encounter (Signed)
New Message:    STAT if patient feels like he/she is going to faint   1) Are you dizzy now? No   2) Do you feel faint or have you passed out? No   3) Do you have any other symptoms? NO   4) Have you checked your HR and BP (record if available)?  187/90 Yesterday  136/81 Yesterday

## 2018-06-26 NOTE — Telephone Encounter (Signed)
Transmission received and reviewed, pacemaker function normal no recent episodes.

## 2018-06-26 NOTE — Patient Instructions (Addendum)
We will be checking the following labs today - BMET, CBC   Medication Instructions:    Continue with your current medicines.   Ok to use Meclizine/Antivert if needed.     Testing/Procedures To Be Arranged:  We are going to get the echo later this week.   Follow-Up:   See Bonney Leitz, PA as planned in October.     Other Special Instructions:   Monitor the BP as we talked about  Try to get him to be more active - consider recumbent bike    If you need a refill on your cardiac medications before your next appointment, please call your pharmacy.   Call the [place] office at [phone] if you have any questions, problems or concerns.

## 2018-06-26 NOTE — Telephone Encounter (Signed)
I spoke with pt's wife. She reports pt was sitting in recliner last evening and had episode where room felt like it was spinning. Lasted about 5 minutes. No other symptoms. BP after event was 187/90 and then within an hour and a half was 136/81. He got up during the night and felt fine.  Today he is still sleeping.  He does have allergies and sinus issues and uses a nasal spray. Has never had feeling of room spinning before. I advised wife to let us know if pt has another episode.  I told her she could call on call provider if needed if this happens after office hours. I told pt's wife I would make Dr. Angelena Form aware and call her back if he wanted to make any changes. Pt is seeing K. Grandville Silos, [place] on October 9,2019

## 2018-06-26 NOTE — Telephone Encounter (Signed)
He had recent TAVR but long standing history of dizziness, orthostasis and memory issues. If his blood pressure is stable at home and pacemaker function is normal, this is likely not an urgent issue. If his wife is concerned, we can try to see him in our office today. Thanks, chris

## 2018-06-26 NOTE — Telephone Encounter (Signed)
I would favor giving the Meclizine 25 mg - can use up to 3 times a day if needed. Would at least give one dose tonight.

## 2018-06-26 NOTE — Telephone Encounter (Signed)
° ° ° °  Patient's spouse calling to report patient is vomiting since leaving the office today. Dizziness continues when moving around. Please call  1) Are you dizzy now? Yes when moving   2) Do you feel faint or have you passed out? No  3) Do you have any other symptoms? Vomiting   4) Have you checked your HR and BP (record if available)? No

## 2018-06-26 NOTE — Telephone Encounter (Signed)
Received call transferred directly from operator and spoke with pt's wife. She reports pt got up this morning and was able to get dressed without problems. After being up for awhile he started feeling dizzy. Does not feel like he is going to pass out but very unsteady on feet and does not feel comfortable walking without assistance. He does sometimes have dizziness when changing positions but feels this unsteadiness is different. He does have a pacemaker and wife will send transmission. Will review with Dr. Angelena Form

## 2018-06-26 NOTE — Telephone Encounter (Signed)
Reviewed with Dr. Angelena Form and need to make sure BP is OK today.  I called back and spoke with pt's wife who reports BP is 156/93 and 152/88 today.  She reports after lying down pt got up and feels a little better.  She is concerned about unsteadiness pt has been having.  I offered pt office visit today and she would like to bring pt in to be checked out.  Pt scheduled to see Truitt Merle, NP today at 2:00.  Wife has sent pacemaker transmission.

## 2018-06-27 ENCOUNTER — Telehealth: Payer: Self-pay | Admitting: Nurse Practitioner

## 2018-06-27 LAB — CBC
Hematocrit: 44.7 % (ref 37.5–51.0)
Hemoglobin: 15.1 g/dL (ref 13.0–17.7)
MCH: 31.2 pg (ref 26.6–33.0)
MCHC: 33.8 g/dL (ref 31.5–35.7)
MCV: 92 fL (ref 79–97)
Platelets: 143 10*3/uL — ABNORMAL LOW (ref 150–450)
RBC: 4.84 x10E6/uL (ref 4.14–5.80)
RDW: 12.8 % (ref 12.3–15.4)
WBC: 8.6 10*3/uL (ref 3.4–10.8)

## 2018-06-27 LAB — BASIC METABOLIC PANEL
BUN/Creatinine Ratio: 14 (ref 10–24)
BUN: 14 mg/dL (ref 8–27)
CO2: 25 mmol/L (ref 20–29)
Calcium: 10 mg/dL (ref 8.6–10.2)
Chloride: 98 mmol/L (ref 96–106)
Creatinine, Ser: 0.98 mg/dL (ref 0.76–1.27)
GFR calc Af Amer: 82 mL/min/{1.73_m2} (ref >59)
GFR calc non Af Amer: 71 mL/min/{1.73_m2} (ref >59)
Glucose: 135 mg/dL — ABNORMAL HIGH (ref 65–99)
Potassium: 4.9 mmol/L (ref 3.5–5.2)
Sodium: 142 mmol/L (ref 134–144)

## 2018-06-27 NOTE — Telephone Encounter (Signed)
Follow Up:    Patient returning a call

## 2018-06-29 ENCOUNTER — Other Ambulatory Visit: Payer: Self-pay

## 2018-06-29 ENCOUNTER — Ambulatory Visit (HOSPITAL_COMMUNITY): Payer: Medicare Other | Attending: Cardiovascular Disease

## 2018-06-29 DIAGNOSIS — Z87891 Personal history of nicotine dependence: Secondary | ICD-10-CM | POA: Diagnosis not present

## 2018-06-29 DIAGNOSIS — I440 Atrioventricular block, first degree: Secondary | ICD-10-CM | POA: Insufficient documentation

## 2018-06-29 DIAGNOSIS — E785 Hyperlipidemia, unspecified: Secondary | ICD-10-CM | POA: Diagnosis not present

## 2018-06-29 DIAGNOSIS — I4891 Unspecified atrial fibrillation: Secondary | ICD-10-CM | POA: Insufficient documentation

## 2018-06-29 DIAGNOSIS — Z952 Presence of prosthetic heart valve: Secondary | ICD-10-CM | POA: Insufficient documentation

## 2018-06-29 DIAGNOSIS — I358 Other nonrheumatic aortic valve disorders: Secondary | ICD-10-CM | POA: Diagnosis not present

## 2018-07-10 ENCOUNTER — Telehealth: Payer: Self-pay

## 2018-07-10 NOTE — Telephone Encounter (Signed)
lpmtcb 10/7 pertaining to her 10/9 appt with K. Grandville Silos

## 2018-07-10 NOTE — Progress Notes (Signed)
[facility] [place]                                       Cardiology Office Note    Date:  07/12/2018   ID:  Jeffery Rogers, DOB 1932/09/28, MRN [id number]  PCP:  [name], MD  Cardiologist: Dr. Lovena Le / Dr. Angelena Form & Dr. Roxy Manns (TAVR)  CC: 1 month s/p TAVR   History of Present Illness:  Jeffery Rogers is a 84 y.o. male with a history of persistent atrial fibrillation on Eliquis, CHB s/p PPM (2017), dementia, hearing loss and severe AS s/p TAVR (05/30/18) who presents to clinic for follow up.   Echocardiogram on 03/24/2018 showed progression to severe aortic stenosis with a mean gradient of 45 mmHg and a peak gradient of 68 mmHg. Left ventricular ejection fraction was 60 to 65%. He complained of some dyspnea on exertion and fatigue.[facility] 05/03/18 showed mild non obst CAD.  He underwent successful TAVR with a30m Edwards Sapien 3 THV via the TF approach on 05/30/18. Post operative echoshowed EF 65-70%, normally functioning valve with no obvious PVL and mean gradient 14 mm Hg. He was discharged on ASA and Eliquis 2.566mdaily.   He was feeling better at his 1 week appointment but was added onto LoDextererhardt's schedule on 06/26/18 for dizziness and unsteadiness. He was later found to have vertigo. Echo was repeated on 06/29/18 which showed EF 60%, normally functioning TAVR valve with mild valvular regurgitation; mean gradient 14 mm Hg.  Today he presents to clinic for follow up. No CP or SOB. No LE edema, orthopnea or PND.  He gets occasional dizziness when standing from sitting but no syncope. No blood in stool or urine. No palpitations.  He has been feeling quite well with no issues.  His vertigo has resolved.  He has stopped riding his road bike but is anxious to get on a stationary bike.  His family is worried about him falling off and might lower the seat.    Past Medical History:  Diagnosis Date  . Arthritis    "right arm/shoulder"  (02/10/2016)  . Complete heart block ([facility]/06/2016   a. s/p BoEmergency planning/management officer. Goiter   . Hypercholesteremia   . Memory loss   . Nasal septal deviation   . Persistent atrial fibrillation   . Presence of permanent cardiac pacemaker   . 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 Severe aortic stenosis   . Tremor     Past Surgical History:  Procedure Laterality Date  . CATARACT EXTRACTION W/ INTRAOCULAR LENS  IMPLANT, BILATERAL Bilateral ~ 2000  . EP IMPLANTABLE DEVICE N/A 02/11/2016   Procedure: Pacemaker Implant;  Surgeon: GrEvans LanceMD;  [facility];  Service: Cardiovascular;  Laterality: N/A;  . RIGHT/LEFT HEART CATH AND CORONARY ANGIOGRAPHY N/A 05/03/2018   Procedure: RIGHT/LEFT HEART CATH AND CORONARY ANGIOGRAPHY;  Surgeon: McBurnell BlanksMD;  [facility];  Service: Cardiovascular;  Laterality: N/A;  . TEE WITHOUT CARDIOVERSION N/A 05/30/2018   Procedure: TRANSESOPHAGEAL ECHOCARDIOGRAM (TEE);  Surgeon: McBurnell BlanksMD;  [place] [facility] Service: [facility];  Laterality: N/A;  . TONSILLECTOMY  1930s  . TRANSCATHETER AORTIC VALVE REPLACEMENT, TRANSFEMORAL  N/A 05/30/2018   Procedure: TRANSCATHETER AORTIC VALVE REPLACEMENT, TRANSFEMORAL using a 4m Edwards Sapien 3 Aortic Valve;  Surgeon: MBurnell Blanks MD;  [facility]  Service: Open Heart Surgery;  Laterality: N/A;  . WISDOM TOOTH EXTRACTION  1990s    Current Medications: Outpatient Medications Prior to Visit  Medication Sig Dispense Refill  . amoxicillin (AMOXIL) 500 MG tablet Take 4 tablets (2,000 mg) one hour prior to dental visits. 8 tablet 6  . apixaban (ELIQUIS) 5 MG TABS tablet Take 1 tablet (5 mg total) by mouth 2 (two) times daily. 180 tablet 3  . aspirin EC 81 MG tablet Take 81 mg by mouth daily.    .Marland Kitchen atorvastatin (LIPITOR) 40 MG tablet Take 1 tablet (40 mg total) by mouth daily. 90 tablet 2  . Cholecalciferol (VITAMIN D-3) 1000 units CAPS Take 1,000 Units by mouth daily.     .Marland Kitchendonepezil (ARICEPT) 10 MG tablet Take 1 tablet (10 mg total) by mouth at bedtime. 90 tablet 4  . ipratropium (ATROVENT) 0.06 % nasal spray Place 1-2 sprays into both nostrils 4 (four) times daily as needed for rhinitis.    .Marland KitchenL-Methylfolate-Algae-B12-B6 (METANX) 3-90.314-2-35 MG CAPS Take 1 capsule by mouth 2 (two) times daily. 180 capsule 4  . memantine (NAMENDA) 10 MG tablet Take 1 tablet (10 mg total) by mouth 2 (two) times daily. 180 tablet 4  . QUEtiapine (SEROQUEL) 25 MG tablet Take 1 tablet (25 mg total) by mouth at bedtime. 90 tablet 3  . sertraline (ZOLOFT) 50 MG tablet Take 50 mg by mouth daily.     . vitamin C (ASCORBIC ACID) 500 MG tablet Take 500 mg by mouth daily.    .Marland Kitchenaspirin 81 MG chewable tablet Chew 1 tablet (81 mg total) by mouth daily. (Patient not taking: Reported on 07/12/2018)     No facility-administered medications prior to visit.      Allergies:   Patient has no known allergies.   Social History   Socioeconomic History  . Marital status: Married    Spouse name: Not on file  . Number of children: 2  . Years of education: Bachelors  . Highest education level: Not on file  Occupational History  . 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 Financial resource strain: Not on file  . Food insecurity:    Worry: Not on file    Inability: Not on file  . Transportation needs:    Medical: Not on file    Non-medical: Not on file  Tobacco Use  . Smoking status: Former Smoker    Packs/day: 1.00    Years: 16.00    Pack years: 16.00    Types: Cigarettes  . Smokeless tobacco: Never Used  . Tobacco comment: Quit smoking cigarettes in 1971  Substance and Sexual Activity  . Alcohol use: Never    Alcohol/week: 2.0 standard drinks    Types: 1 Cans of beer, 1 Shots of liquor per week     Frequency: Never  . Drug use: No  . Sexual activity: Yes  Lifestyle  . Physical activity:    Days per week: Not on file    Minutes per session: Not on file  . Stress: Not on file  Relationships  . Social connections:    Talks on phone: Not on file    Gets together: Not on file    Attends religious service: Not on file    Active member of club or organization: Not on file  Attends meetings of clubs or organizations: Not on file    Relationship status: Not on file  Other Topics Concern  . Not on file  Social History Narrative   Lives at home with his wife.   Right-handed.   1 cup coffee per day.  Occasional Coke.     Family History:  The patient's family history includes CAD in his father; Irregular heart beat in his unknown relative; Stroke in his father, mother, and unknown relative.      ROS:   Please see the history of present illness.    ROS All other systems reviewed and are negative.   PHYSICAL EXAM:   VS:  BP (!) 152/84   Pulse 61   Ht _0  (1.803 m)   Wt 180 lb 12.8 oz (82 kg)   SpO2 99%   BMI 25.22 kg/m    GEN: Well nourished, well developed, in no acute distress HEENT: normal Neck: no JVD or masses Cardiac: Irregularly irregular; soft flow murmur. No rubs, or gallops,no edema  Respiratory:  clear to auscultation bilaterally, normal work of breathing GI: soft, nontender, nondistended, + BS MS: no deformity or atrophy Skin: warm and dry, no rash Neuro:  Alert and Oriented x 3, Strength and sensation are intact Psych: euthymic mood, full affect   Wt Readings from Last 3 Encounters:  07/12/18 180 lb 12.8 oz (82 kg)  06/26/18 178 lb 12.8 oz (81.1 kg)  06/08/18 179 lb 6.4 oz (81.4 kg)      Studies/Labs Reviewed:   EKG:  EKG is NOT ordered today.    Recent Labs: 05/22/2018: ALT 29; B Natriuretic Peptide 277.4 05/31/2018: Magnesium 2.1 06/26/2018: BUN 14; Creatinine, Ser 0.98; Hemoglobin 15.1; Platelets 143; Potassium 4.9; Sodium 142   Lipid  Panel No results found for: CHOL, TRIG, HDL, CHOLHDL, VLDL, LDLCALC, LDLDIRECT  Additional studies/ records that were reviewed today include:    TAVR OPERATIVE NOTE   Date of Procedure:05/30/2018  Preoperative Diagnosis:Severe Aortic Stenosis   Procedure:   Transcatheter Aortic Valve Replacement - Transfemoral Approach Edwards Sapien 3 THV (size 54m, model # [id number] serial #[id number]  Co-Surgeons:Christopher MAngelena Form MD and CValentina Gu ORoxy Manns MD   Pre-operative Echo Findings: ? Severe aortic stenosis ? Normalleft ventricular systolic function  Post-operative Echo Findings: ? Trivial to mildparavalvular leak ? Normalleft ventricular systolic function  ______________   2D ECHO 12/01/17 (post op) Study Conclusions - Left ventricle: The cavity size was normal. There was severe concentric hypertrophy. Systolic function was vigorous. The estimated ejection fraction was in the range of 65% to 70%. Wall motion was normal; there were no regional wall motion abnormalities. The study is not technically sufficient to allow evaluation of LV diastolic function. Doppler parameters are consistent with high ventricular filling pressure. - Aortic valve: S/P TAVR well functioning with no obvious perivalvular AR. Mean gradient (S): 14 mm Hg. Valve Rogers (VTI): 1.86 cm^2. Valve Rogers (Vmax): 1.65 cm^2. Valve Rogers (Vmean): 1.99 cm^2. - Mitral valve: Severely calcified annulus. There was mild regurgitation. Valve Rogers by pressure half-time: 1.64 cm^2. - Left atrium: The atrium was moderately dilated. - Right ventricle: Systolic function was mildly reduced. - Right atrium: The atrium was moderately dilated. - Pulmonary arteries: PA peak pressure: 41 mm Hg (S). Impressions: - Severe LVH, normal LVF with EF 65-70%, Insufficient to assess diastolic function, mild TR, moderate biatrial  enlargement, mildly reduced RFV, severe MAC with mild MR. S/P TAVR with mean AVG 151mg and no perivalvular AI. Compared to prior echo,  TAVR is now present. The right ventricular systolic pressure was increased consistent with moderate pulmonary hypertension.  ____________  Echo 07/12/18 (1 month s/p TAVR) Study Conclusions - Left ventricle: The cavity size was normal. There was mild   concentric hypertrophy. Systolic function was normal. The   estimated ejection fraction was in the range of 60% to 65%. Wall   motion was normal; there were no regional wall motion   abnormalities. - Ventricular septum: Septal motion showed abnormal function,   dyssynergy, and paradox. These changes are consistent with right   ventricular pacing. - Aortic valve: A stent-valve (TAVR) bioprosthesis was present and   functioning normally. There was mild valvular regurgitation   (probably not perivavlvular, but difficult to be sure). - Mitral valve: Severely calcified annulus. - Left atrium: The atrium was mildly dilated. - Right ventricle: The cavity size was mildly dilated. Wall   thickness was normal. - Atrial septum: No defect or patent foramen ovale was identified. - Pulmonary arteries: Systolic pressure was mildly increased. PA   peak pressure: 34 mm Hg (S). Impressions: - TAVR prosthesis gradients are unchanged. Mild aortic   insufficiency is seen, likely not perivalvular.   ASSESSMENT & PLAN:   Severe AS s/p TAVR: 2D ECHO 07/12/18 showed EF 60% with a normally functioning TAVR valve with mild regurgitation and mean gradient of 14 mmHg.Marland Kitchen He has NYHA class I symptoms. SBE prophylaxis discussed; he has amoxicillin. ASA can be discontinued after 6 months of therapy (March 2020).  I will see him back in 1 year with an echo.  Persistent atrial fibrillation: rate well controlled. Continue Eliquis  S/p PPM: followed by Dr. Lovena Le  Dementia: he seems more forgetful today.   Elevated BP:  BP has been elevated at all recent visits. He and his wife think it runs normal at home and would like to keep a log of home BPs before starting medication. They will keep a log over the next week and call me with the list.   Medication Adjustments/Labs and Tests Ordered: Current medicines are reviewed at length with the patient today.  Concerns regarding medicines are outlined above.  Medication changes, Labs and Tests ordered today are listed in the Patient Instructions below. Patient Instructions  Medication Instructions:   STOP Aspirin after December 29, 2018  If you need a refill on your cardiac medications before your next appointment, please call your pharmacy.   Lab work: None Ordered  If you have labs (blood work) drawn today and your tests are completely normal, you will receive your results only by: Marland Kitchen MyChart Message (if you have MyChart) OR . A paper copy in the mail If you have any lab test that is abnormal or we need to change your treatment, we will call you to review the results.  Testing/Procedures: Your physician has requested that you have an echocardiogram in 1 year. Echocardiography is a painless test that uses sound waves to create images of your heart. It provides your doctor with information about the size and shape of your heart and how well your heart's chambers and valves are working. This procedure takes approximately one hour. There are no restrictions for this procedure.   HOW TO TAKE YOUR BLOOD PRESSURE:  Rest 5 minutes before taking your blood pressure.   Don't smoke or drink caffeinated beverages for at least 30 minutes before.  Take your blood pressure before (not after) you eat.  Sit comfortably with your back supported and both feet on the floor (don't  cross your legs).  Elevate your arm to heart level on a table or a desk.  Use the proper sized cuff. It should fit smoothly and snugly around your bare upper arm. There should be enough room to slip a  fingertip under the cuff. The bottom edge of the cuff should be 1 inch above the crease of the elbow.  Do not repeat the BP in the same arm for several minutes.     Follow-Up: Your physician recommends that you schedule a follow-up appointment in: 3 months with Dr. Lovena Le  Your physician wants you to follow-up in: 1 year with Nell Range, PA. You will receive a reminder letter in the mail two months in advance. If you don't receive a letter, please call our office to schedule the follow-up appointment.       Signed, Angelena Form, PA-C  07/12/2018 3:47 PM    [facility] [place], [place], [place]  [address] Phone: [phone]; Fax: [phone]

## 2018-07-10 NOTE — Telephone Encounter (Signed)
   [facility] [place] with patient's wife about upcoming appointment. She verbalized understanding.   Angelena Form PA-C  [facility]

## 2018-07-11 ENCOUNTER — Telehealth (HOSPITAL_COMMUNITY): Payer: Self-pay

## 2018-07-12 ENCOUNTER — Ambulatory Visit (INDEPENDENT_AMBULATORY_CARE_PROVIDER_SITE_OTHER): Payer: Medicare Other | Admitting: Physician Assistant

## 2018-07-12 ENCOUNTER — Other Ambulatory Visit (HOSPITAL_COMMUNITY): Payer: Medicare Other

## 2018-07-12 ENCOUNTER — Encounter: Payer: Self-pay | Admitting: Physician Assistant

## 2018-07-12 VITALS — BP 152/84 | HR 61 | Ht 71.0 in | Wt 180.8 lb

## 2018-07-12 DIAGNOSIS — Z950 Presence of cardiac pacemaker: Secondary | ICD-10-CM

## 2018-07-12 DIAGNOSIS — I359 Nonrheumatic aortic valve disorder, unspecified: Secondary | ICD-10-CM

## 2018-07-12 DIAGNOSIS — F0390 Unspecified dementia without behavioral disturbance: Secondary | ICD-10-CM | POA: Diagnosis not present

## 2018-07-12 DIAGNOSIS — H903 Sensorineural hearing loss, bilateral: Secondary | ICD-10-CM | POA: Diagnosis not present

## 2018-07-12 DIAGNOSIS — H838X3 Other specified diseases of inner ear, bilateral: Secondary | ICD-10-CM | POA: Diagnosis not present

## 2018-07-12 DIAGNOSIS — I4819 Other persistent atrial fibrillation: Secondary | ICD-10-CM

## 2018-07-12 DIAGNOSIS — Z952 Presence of prosthetic heart valve: Secondary | ICD-10-CM

## 2018-07-12 NOTE — Patient Instructions (Signed)
Medication Instructions:   STOP Aspirin after December 29, 2018  If you need a refill on your cardiac medications before your next appointment, please call your pharmacy.   Lab work: None Ordered  If you have labs (blood work) drawn today and your tests are completely normal, you will receive your results only by: Marland Kitchen MyChart Message (if you have MyChart) OR . A paper copy in the mail If you have any lab test that is abnormal or we need to change your treatment, we will call you to review the results.  Testing/Procedures: Your physician has requested that you have an echocardiogram in 1 year. Echocardiography is a painless test that uses sound waves to create images of your heart. It provides your doctor with information about the size and shape of your heart and how well your heart's chambers and valves are working. This procedure takes approximately one hour. There are no restrictions for this procedure.   HOW TO TAKE YOUR BLOOD PRESSURE:  Rest 5 minutes before taking your blood pressure.   Don't smoke or drink caffeinated beverages for at least 30 minutes before.  Take your blood pressure before (not after) you eat.  Sit comfortably with your back supported and both feet on the floor (don't cross your legs).  Elevate your arm to heart level on a table or a desk.  Use the proper sized cuff. It should fit smoothly and snugly around your bare upper arm. There should be enough room to slip a fingertip under the cuff. The bottom edge of the cuff should be 1 inch above the crease of the elbow.  Do not repeat the BP in the same arm for several minutes.     Follow-Up: Your physician recommends that you schedule a follow-up appointment in: 3 months with Dr. Lovena Le  Your physician wants you to follow-up in: 1 year with Nell Range, PA. You will receive a reminder letter in the mail two months in advance. If you don't receive a letter, please call our office to schedule the follow-up  appointment.

## 2018-07-17 ENCOUNTER — Other Ambulatory Visit: Payer: Self-pay | Admitting: Physician Assistant

## 2018-07-17 DIAGNOSIS — L570 Actinic keratosis: Secondary | ICD-10-CM | POA: Diagnosis not present

## 2018-07-17 MED ORDER — AMLODIPINE BESYLATE 5 MG PO TABS: 5.0000 mg | ORAL_TABLET | Freq: Every day | ORAL | 6 refills | Status: DC

## 2018-07-18 ENCOUNTER — Encounter: Payer: Self-pay | Admitting: Thoracic Surgery (Cardiothoracic Vascular Surgery)

## 2018-07-25 DIAGNOSIS — Z23 Encounter for immunization: Secondary | ICD-10-CM | POA: Diagnosis not present

## 2018-08-07 ENCOUNTER — Telehealth (HOSPITAL_COMMUNITY): Payer: Self-pay

## 2018-08-07 NOTE — Progress Notes (Signed)
SUMNER KIRCHMAN 84 y.o. male DOB Apr 24, 1932 MRN [id number]       Nutrition  No diagnosis found. Past Medical History:  Diagnosis Date  . Arthritis    "right arm/shoulder" (02/10/2016)  . Complete heart block ([place]) 02/10/2016   a. s/p Emergency planning/management officer  . Goiter   . Hypercholesteremia   . Memory loss   . Nasal septal deviation   . Persistent atrial fibrillation   . Presence of permanent cardiac pacemaker   . 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 Severe aortic stenosis   . Tremor    Meds reviewed.     Current Outpatient Medications (Cardiovascular):  .  amLODipine (NORVASC) 5 MG tablet, Take 1 tablet (5 mg total) by mouth daily. Marland Kitchen  atorvastatin (LIPITOR) 40 MG tablet, Take 1 tablet (40 mg total) by mouth daily.  Current Outpatient Medications (Respiratory):  .  ipratropium (ATROVENT) 0.06 % nasal spray, Place 1-2 sprays into both nostrils 4 (four) times daily as needed for rhinitis.  Current Outpatient Medications (Analgesics):  .  aspirin EC 81 MG tablet, Take 81 mg by mouth daily.  Current Outpatient Medications (Hematological):  .  apixaban (ELIQUIS) 5 MG TABS tablet, Take 1 tablet (5 mg total) by mouth 2 (two) times daily.  Current Outpatient Medications (Other):  .  amoxicillin (AMOXIL) 500 MG tablet, Take 4 tablets (2,000 mg) one hour prior to dental visits. .  Cholecalciferol (VITAMIN D-3) 1000 units CAPS, Take 1,000 Units by mouth daily.  Marland Kitchen  donepezil (ARICEPT) 10 MG tablet, Take 1 tablet (10 mg total) by mouth at bedtime. Marland Kitchen  L-Methylfolate-Algae-B12-B6 (METANX) 3-90.314-2-35 MG CAPS, Take 1 capsule by mouth 2 (two) times daily. .  memantine (NAMENDA) 10 MG tablet, Take 1 tablet (10 mg total) by mouth 2 (two) times daily. .  QUEtiapine (SEROQUEL) 25 MG tablet, Take 1 tablet (25 mg total) by mouth at bedtime. .  sertraline (ZOLOFT) 50 MG  tablet, Take 50 mg by mouth daily.  .  vitamin C (ASCORBIC ACID) 500 MG tablet, Take 500 mg by mouth daily.   HT: Ht Readings from Last 1 Encounters:  07/12/18 5\' 11"  (1.803 m)    WT: Wt Readings from Last 5 Encounters:  07/12/18 180 lb 12.8 oz (82 kg)  06/26/18 178 lb 12.8 oz (81.1 kg)  06/08/18 179 lb 6.4 oz (81.4 kg)  06/01/18 183 lb 13.8 oz (83.4 kg)  05/22/18 177 lb 12.8 oz (80.6 kg)      BMI = 25.23 (07/12/18)  Current tobacco use? No       Labs:  Lipid Panel  No results found for: CHOL, TRIG, HDL, CHOLHDL, VLDL, LDLCALC, LDLDIRECT  Lab Results  Component Value Date   HGBA1C 5.5 05/22/2018   CBG (last 3)  No results for input(s): GLUCAP in the last 72 hours.  Nutrition Diagnosis ? Food-and nutrition-related knowledge deficit related to lack of exposure to information as related to diagnosis of: ? CVD   Nutrition Goal(s):  ? To be determined  Plan:  Pt to attend nutrition classes ? Nutrition I ? Nutrition II ? Portion Distortion  Will provide client-centered nutrition education as part of interdisciplinary care.   Monitor and evaluate progress toward nutrition goal with team.  Laurina Bustle, MS, RD, LDN 08/07/2018 10:34 AM

## 2018-08-08 ENCOUNTER — Encounter (HOSPITAL_COMMUNITY): Payer: Self-pay

## 2018-08-08 ENCOUNTER — Encounter (HOSPITAL_COMMUNITY)
Admission: RE | Admit: 2018-08-08 | Discharge: 2018-08-08 | Disposition: A | Discharge status: Home / Self Care | Payer: Medicare Other | Source: Ambulatory Visit | Attending: Cardiovascular Disease | Admitting: Cardiovascular Disease

## 2018-08-08 VITALS — Ht 70.0 in | Wt 178.4 lb

## 2018-08-08 DIAGNOSIS — Z952 Presence of prosthetic heart valve: Secondary | ICD-10-CM | POA: Diagnosis not present

## 2018-08-08 NOTE — Progress Notes (Signed)
Cardiac Individual Treatment Plan  Patient Details  Name: Jeffery Rogers MRN: [id number] Date of Birth: 05/29/1932 Referring Provider:   Flowsheet Row CARDIAC REHAB PHASE II ORIENTATION from 08/08/2018 in [place]  Referring Provider  Val Riles MD      Initial Encounter Date:  [place] PHASE II ORIENTATION from 08/08/2018 in [place]  Date  08/08/18      Visit Diagnosis: 05/30/2018 S/P TAVR (transcatheter aortic valve replacement)  Patient's Home Medications on Admission:  Current Outpatient Medications:  .  amLODipine (NORVASC) 5 MG tablet, Take 1 tablet (5 mg total) by mouth daily., Disp: 30 tablet, Rfl: 6 .  amoxicillin (AMOXIL) 500 MG tablet, Take 4 tablets (2,000 mg) one hour prior to dental visits., Disp: 8 tablet, Rfl: 6 .  apixaban (ELIQUIS) 5 MG TABS tablet, Take 1 tablet (5 mg total) by mouth 2 (two) times daily., Disp: 180 tablet, Rfl: 3 .  aspirin EC 81 MG tablet, Take 81 mg by mouth daily., Disp: , Rfl:  .  atorvastatin (LIPITOR) 40 MG tablet, Take 1 tablet (40 mg total) by mouth daily., Disp: 90 tablet, Rfl: 2 .  Cholecalciferol (VITAMIN D-3) 1000 units CAPS, Take 1,000 Units by mouth daily. , Disp: , Rfl:  .  donepezil (ARICEPT) 10 MG tablet, Take 1 tablet (10 mg total) by mouth at bedtime., Disp: 90 tablet, Rfl: 4 .  ipratropium (ATROVENT) 0.06 % nasal spray, Place 1-2 sprays into both nostrils 4 (four) times daily as needed for rhinitis., Disp: , Rfl:  .  L-Methylfolate-Algae-B12-B6 (METANX) 3-90.314-2-35 MG CAPS, Take 1 capsule by mouth 2 (two) times daily., Disp: 180 capsule, Rfl: 4 .  memantine (NAMENDA) 10 MG tablet, Take 1 tablet (10 mg total) by mouth 2 (two) times daily., Disp: 180 tablet, Rfl: 4 .  QUEtiapine (SEROQUEL) 25 MG tablet, Take 1 tablet (25 mg total) by mouth at bedtime., Disp: 90 tablet, Rfl: 3 .  sertraline (ZOLOFT) 50 MG tablet, Take 50 mg by mouth  daily. , Disp: , Rfl:  .  vitamin C (ASCORBIC ACID) 500 MG tablet, Take 500 mg by mouth daily., Disp: , Rfl:   Past Medical History: Past Medical History:  Diagnosis Date  . Arthritis    "right arm/shoulder" (02/10/2016)  . Complete heart block ([place]) 02/10/2016   a. s/p Emergency planning/management officer  . Goiter   . Hypercholesteremia   . Memory loss   . Nasal septal deviation   . Persistent atrial fibrillation   . Presence of permanent cardiac pacemaker   . 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 Severe aortic stenosis   . Tremor     Tobacco Use: Social History   Tobacco Use  Smoking Status Former Smoker  . Packs/day: 1.00  . Years: 16.00  . Pack years: 16.00  . Types: Cigarettes  Smokeless Tobacco Never Used  Tobacco Comment   Quit smoking cigarettes in 1971    Labs: Recent Review Flowsheet Data    Labs for ITP Cardiac and Pulmonary Rehab Latest Ref Rng & Units 05/22/2018 05/30/2018 05/30/2018 05/30/2018 05/30/2018   Hemoglobin A1c 4.8 - 5.6 % 5.5 - - - -   PHART 7.350 - 7.450 7.445 - - - -   PCO2ART 32.0 - 48.0 mmHg 34.3 - - - -   HCO3 20.0 - 28.0 mmol/L 23.2 - - - -  TCO2 22 - 32 mmol/L - 24 24 25 25    ACIDBASEDEF 0.0 - 2.0 mmol/L 0.4 - - - -   O2SAT % 97.9 - - - -      Capillary Blood Glucose: No results found for: GLUCAP   Exercise Target Goals: Exercise Program Goal: Individual exercise prescription set using results from initial 6 min walk test and THRR while considering  patient's activity barriers and safety.   Exercise Prescription Goal: Initial exercise prescription builds to 30-45 minutes a day of aerobic activity, 2-3 days per week.  Home exercise guidelines will be given to patient during program as part of exercise prescription that the participant will acknowledge.  Activity Barriers & Risk Stratification: Activity Barriers &  Cardiac Risk Stratification - 08/08/18 1147    Activity Barriers & Cardiac Risk Stratification          Activity Barriers  Balance Concerns;Deconditioning    Cardiac Risk Stratification  High           6 Minute Walk: 6 Minute Walk    6 Minute Walk    Row Name 08/08/18 1144   Phase  Initial   Distance  855 feet   Walk Time  6 minutes   # of Rest Breaks  0   MPH  1.62   METS  0.9   RPE  9   Perceived Dyspnea   0   VO2 Peak  3.16   Symptoms  No   Resting HR  60 bpm   Resting BP  120/80   Resting Oxygen Saturation   98 %   Exercise Oxygen Saturation  during 6 min walk  99 %   Max Ex. HR  77 bpm   Max Ex. BP  104/60   2 Minute Post BP  118/56          Oxygen Initial Assessment:   Oxygen Re-Evaluation:   Oxygen Discharge (Final Oxygen Re-Evaluation):   Initial Exercise Prescription: Initial Exercise Prescription - 08/08/18 1100    Date of Initial Exercise RX and Referring Provider          Date  08/08/18    Referring Provider  Val Riles MD    Expected Discharge Date  11/13/18        T5 Nustep          Level  1    SPM  75    Minutes  20    METs  1        Track          Laps  4    Minutes  10    METs  0.9        Prescription Details          Frequency (times per week)  3x    Duration  Progress to 30 minutes of continuous aerobic without signs/symptoms of physical distress        Intensity          THRR 40-80% of Max Heartrate  54-108    Ratings of Perceived Exertion  11-13    Perceived Dyspnea  0-4        Progression          Progression  Continue progressive overload as per policy without signs/symptoms or physical distress.        Resistance Training          Training Prescription  Yes    Weight  2lbs    Reps  10-15  Perform Capillary Blood Glucose checks as needed.  Exercise Prescription Changes:   Exercise Comments:   Exercise Goals and Review: Exercise Goals    Exercise Goals    Row Name  08/08/18 1146   Increase Physical Activity  Yes   Intervention  Provide advice, education, support and counseling about physical activity/exercise needs.;Develop an individualized exercise prescription for aerobic and resistive training based on initial evaluation findings, risk stratification, comorbidities and participant's personal goals.   Expected Outcomes  Short Term: Attend rehab on a regular basis to increase amount of physical activity.;Long Term: Add in home exercise to make exercise part of routine and to increase amount of physical activity.;Long Term: Exercising regularly at least 3-5 days a week.   Increase Strength and Stamina  Yes   Intervention  Provide advice, education, support and counseling about physical activity/exercise needs.;Develop an individualized exercise prescription for aerobic and resistive training based on initial evaluation findings, risk stratification, comorbidities and participant's personal goals.   Expected Outcomes  Short Term: Perform resistance training exercises routinely during rehab and add in resistance training at home;Short Term: Increase workloads from initial exercise prescription for resistance, speed, and METs.;Long Term: Improve cardiorespiratory fitness, muscular endurance and strength as measured by increased METs and functional capacity (6MWT)   Able to understand and use rate of perceived exertion (RPE) scale  Yes   Intervention  Provide education and explanation on how to use RPE scale   Expected Outcomes  Short Term: Able to use RPE daily in rehab to express subjective intensity level;Long Term:  Able to use RPE to guide intensity level when exercising independently   Knowledge and understanding of Target Heart Rate Range (THRR)  Yes   Intervention  Provide education and explanation of THRR including how the numbers were predicted and where they are located for reference   Expected Outcomes  Short Term: Able to state/look up THRR;Long Term:  Able to use THRR to govern intensity when exercising independently;Short Term: Able to use daily as guideline for intensity in rehab   Able to check pulse independently  Yes   Intervention  Provide education and demonstration on how to check pulse in carotid and radial arteries.;Review the importance of being able to check your own pulse for safety during independent exercise   Expected Outcomes  Short Term: Able to explain why pulse checking is important during independent exercise;Long Term: Able to check pulse independently and accurately   Understanding of Exercise Prescription  Yes   Intervention  Provide education, explanation, and written materials on patient's individual exercise prescription   Expected Outcomes  Short Term: Able to explain program exercise prescription;Long Term: Able to explain home exercise prescription to exercise independently          Exercise Goals Re-Evaluation :   Discharge Exercise Prescription (Final Exercise Prescription Changes):   Nutrition:  Target Goals: Understanding of nutrition guidelines, daily intake of sodium 1500mg , cholesterol 200mg , calories 30% from fat and 7% or less from saturated fats, daily to have 5 or more servings of fruits and vegetables.  Biometrics: Pre Biometrics - 08/08/18 1146    Pre Biometrics          Height  5\' 10"  (1.778 m)    Weight  80.9 kg    Waist Circumference  39 inches    Hip Circumference  41 inches    Waist to Hip Ratio  0.95 %    BMI (Calculated)  25.59    Triceps Skinfold  35 mm    %  Body Fat  30.2 %    Grip Strength  38 kg    Flexibility  0 in    Single Leg Stand  0 seconds            Nutrition Therapy Plan and Nutrition Goals: Nutrition Therapy & Goals - 08/08/18 1042    Nutrition Therapy          Diet  general healthful        Personal Nutrition Goals          Nutrition Goal  Pt to identify and limit food sources of saturated fat, trans fat, refined carbohydrates and sodium     Personal Goal #2  Pt to eat complex carbs at breakfast    Personal Goal #3  Pt able to name foods that affect blood glucose        Intervention Plan          Intervention  Prescribe, educate and counsel regarding individualized specific dietary modifications aiming towards targeted core components such as weight, hypertension, lipid management, diabetes, heart failure and other comorbidities.    Expected Outcomes  Short Term Goal: Understand basic principles of dietary content, such as calories, fat, sodium, cholesterol and nutrients.;Long Term Goal: Adherence to prescribed nutrition plan.           Nutrition Assessments: Nutrition Assessments - 08/08/18 1041    MEDFICTS Scores          Pre Score  18           Nutrition Goals Re-Evaluation: Nutrition Goals Re-Evaluation    Personal Goal #3 Re-Evaluation    Row Name 08/08/18 1610   Personal Goal #3  Pt able to name foods that affect blood glucose          Nutrition Goals Re-Evaluation: Nutrition Goals Re-Evaluation    Personal Goal #3 Re-Evaluation    Row Name 08/08/18 9604   Personal Goal #3  Pt able to name foods that affect blood glucose          Nutrition Goals Discharge (Final Nutrition Goals Re-Evaluation): Nutrition Goals Re-Evaluation - 08/08/18 0937    Personal Goal #3 Re-Evaluation          Personal Goal #3  Pt able to name foods that affect blood glucose           Psychosocial: Target Goals: Acknowledge presence or absence of significant depression and/or stress, maximize coping skills, provide positive support system. Participant is able to verbalize types and ability to use techniques and skills needed for reducing stress and depression.  Initial Review & Psychosocial Screening: Initial Psych Review & Screening - 08/08/18 0917    Initial Review          Current issues with  Current Sleep Concerns;Current Anxiety/Panic   difficult to assess with dementia diagnosis       Family Dynamics           Good Support System?  Yes   spouse, children        Barriers          Psychosocial barriers to participate in program  Psychosocial barriers identified (see note)   dementia        Screening Interventions          Interventions  Encouraged to exercise           Quality of Life Scores: Quality of Life - 08/08/18 0919    Quality of Life  Select  --   unable to assess due to dementia          Scores of 19 and below usually indicate a poorer quality of life in these areas.  A difference of  2-3 points is a clinically meaningful difference.  A difference of 2-3 points in the total score of the Quality of Life Index has been associated with significant improvement in overall quality of life, self-image, physical symptoms, and general health in studies assessing change in quality of life.  PHQ-9: Recent Review Flowsheet Data    There is no flowsheet data to display.     Interpretation of Total Score  Total Score Depression Severity:  1-4 = Minimal depression, 5-9 = Mild depression, 10-14 = Moderate depression, 15-19 = Moderately severe depression, 20-27 = Severe depression   Psychosocial Evaluation and Intervention:   Psychosocial Re-Evaluation:   Psychosocial Discharge (Final Psychosocial Re-Evaluation):   Vocational Rehabilitation: Provide vocational rehab assistance to qualifying candidates.   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 Participant will state understanding/return demonstration of topics presented.  Learning Barriers/Preferences: Learning Barriers/Preferences - 08/08/18 1139    Learning Barriers/Preferences          Learning Barriers   Hearing   Pt has dementia    Learning Preferences  Skilled Demonstration;Individual Instruction           Education Topics: Count Your Pulse:  -Group instruction provided by verbal instruction, demonstration, patient participation and written materials to support subject.  Instructors address importance of being able to find your pulse and how to count your pulse when at home without a heart monitor.  Patients get hands on experience counting their pulse with staff help and individually.   Heart Attack, Angina, and Risk Factor Modification:  -Group instruction provided by verbal instruction, video, and written materials to support subject.  Instructors address signs and symptoms of angina and heart attacks.    Also discuss risk factors for heart disease and how to make changes to improve heart health risk factors.   Functional Fitness:  -Group instruction provided by verbal instruction, demonstration, patient participation, and written materials to support subject.  Instructors address safety measures for doing things around the house.  Discuss how to get up and down off the floor, how to pick things up properly, how to safely get out of a chair without assistance, and balance training.   Meditation and Mindfulness:  -Group instruction provided by verbal instruction, patient participation, and written materials to support subject.  Instructor addresses importance of mindfulness and meditation practice to help reduce stress and improve awareness.  Instructor also leads participants through a meditation exercise.    Stretching for Flexibility and Mobility:  -Group instruction provided by verbal instruction, patient participation, and written materials to support subject.  Instructors lead participants through series of stretches that are designed to increase flexibility thus improving mobility.  These stretches are additional exercise for major muscle groups that are typically performed  during regular warm up and cool down.   Hands Only CPR:  -Group verbal, video, and participation provides a basic overview of AHA guidelines for community CPR. Role-play of emergencies allow  participants the opportunity to practice calling for help and chest compression technique with discussion of AED use.   Hypertension: -Group verbal and written instruction that provides a basic overview of hypertension including the most recent diagnostic guidelines, risk factor reduction with self-care instructions and medication management.    Nutrition I class: Heart Healthy Eating:  -Group instruction provided by PowerPoint slides, verbal discussion, and written materials to support subject matter. The instructor gives an explanation and review of the Therapeutic Lifestyle Changes diet recommendations, which includes a discussion on lipid goals, dietary fat, sodium, fiber, plant stanol/sterol esters, sugar, and the components of a well-balanced, healthy diet.   Nutrition II class: Lifestyle Skills:  -Group instruction provided by PowerPoint slides, verbal discussion, and written materials to support subject matter. The instructor gives an explanation and review of label reading, grocery shopping for heart health, heart healthy recipe modifications, and ways to make healthier choices when eating out.   Diabetes Question & Answer:  -Group instruction provided by PowerPoint slides, verbal discussion, and written materials to support subject matter. The instructor gives an explanation and review of diabetes co-morbidities, pre- and post-prandial blood glucose goals, pre-exercise blood glucose goals, signs, symptoms, and treatment of hypoglycemia and hyperglycemia, and foot care basics.   Diabetes Blitz:  -Group instruction provided by PowerPoint slides, verbal discussion, and written materials to support subject matter. The instructor gives an explanation and review of the physiology behind type 1 and  type 2 diabetes, diabetes medications and rational behind using different medications, pre- and post-prandial blood glucose recommendations and Hemoglobin A1c goals, diabetes diet, and exercise including blood glucose guidelines for exercising safely.    Portion Distortion:  -Group instruction provided by PowerPoint slides, verbal discussion, written materials, and food models to support subject matter. The instructor gives an explanation of serving size versus portion size, changes in portions sizes over the last 20 years, and what consists of a serving from each food group.   Stress Management:  -Group instruction provided by verbal instruction, video, and written materials to support subject matter.  Instructors review role of stress in heart disease and how to cope with stress positively.     Exercising on Your Own:  -Group instruction provided by verbal instruction, power point, and written materials to support subject.  Instructors discuss benefits of exercise, components of exercise, frequency and intensity of exercise, and end points for exercise.  Also discuss use of nitroglycerin and activating EMS.  Review options of places to exercise outside of rehab.  Review guidelines for sex with heart disease.   Cardiac Drugs I:  -Group instruction provided by verbal instruction and written materials to support subject.  Instructor reviews cardiac drug classes: antiplatelets, anticoagulants, beta blockers, and statins.  Instructor discusses reasons, side effects, and lifestyle considerations for each drug class.   Cardiac Drugs II:  -Group instruction provided by verbal instruction and written materials to support subject.  Instructor reviews cardiac drug classes: angiotensin converting enzyme inhibitors (ACE-I), angiotensin II receptor blockers (ARBs), nitrates, and calcium channel blockers.  Instructor discusses reasons, side effects, and lifestyle considerations for each drug  class.   Anatomy and Physiology of the Circulatory System:  Group verbal and written instruction and models provide basic cardiac anatomy and physiology, with the coronary electrical and arterial systems. Review of: AMI, Angina, Valve disease, Heart Failure, Peripheral Artery Disease, Cardiac Arrhythmia, Pacemakers, and the ICD.   Other Education:  -Group or individual verbal, written, or video instructions that support the educational goals of  the cardiac rehab program.   Holiday Eating Survival Tips:  -Group instruction provided by PowerPoint slides, verbal discussion, and written materials to support subject matter. The instructor gives patients tips, tricks, and techniques to help them not only survive but enjoy the holidays despite the onslaught of food that accompanies the holidays.   Knowledge Questionnaire Score: Knowledge Questionnaire Score - 08/08/18 0919    Knowledge Questionnaire Score          Pre Score  20/24, quiz completed by pt wife            Core Components/Risk Factors/Patient Goals at Admission: Personal Goals and Risk Factors at Admission - 08/08/18 1141    Core Components/Risk Factors/Patient Goals on Admission           Weight Management  Yes;Weight Loss    Intervention  Weight Management: Develop a combined nutrition and exercise program designed to reach desired caloric intake, while maintaining appropriate intake of nutrient and fiber, sodium and fats, and appropriate energy expenditure required for the weight goal.;Weight Management: Provide education and appropriate resources to help participant work on and attain dietary goals.;Weight Management/Obesity: Establish reasonable short term and long term weight goals.    Admit Weight  178 lb 5.6 oz (80.9 kg)    Goal Weight: Long Term  170 lb (77.1 kg)    Expected Outcomes  Short Term: Continue to assess and modify interventions until short term weight is achieved;Long Term: Adherence to nutrition and  physical activity/exercise program aimed toward attainment of established weight goal;Weight Loss: Understanding of general recommendations for a balanced deficit meal plan, which promotes 1-2 lb weight loss per week and includes a negative energy balance of [phone] kcal/d;Understanding recommendations for meals to include 15-35% energy as protein, 25-35% energy from fat, 35-60% energy from carbohydrates, less than 200mg  of dietary cholesterol, 20-35 gm of total fiber daily;Understanding of distribution of calorie intake throughout the day with the consumption of 4-5 meals/snacks    Hypertension  Yes    Intervention  Provide education on lifestyle modifcations including regular physical activity/exercise, weight management, moderate sodium restriction and increased consumption of fresh fruit, vegetables, and low fat dairy, alcohol moderation, and smoking cessation.;Monitor prescription use compliance.    Expected Outcomes  Short Term: Continued assessment and intervention until BP is < 140/56mm HG in hypertensive participants. < 130/45mm HG in hypertensive participants with diabetes, heart failure or chronic kidney disease.;Long Term: Maintenance of blood pressure at goal levels.    Lipids  Yes    Intervention  Provide education and support for participant on nutrition & aerobic/resistive exercise along with prescribed medications to achieve LDL 70mg , HDL >40mg .    Expected Outcomes  Short Term: Participant states understanding of desired cholesterol values and is compliant with medications prescribed. Participant is following exercise prescription and nutrition guidelines.;Long Term: Cholesterol controlled with medications as prescribed, with individualized exercise RX and with personalized nutrition plan. Value goals: LDL < 70mg , HDL > 40 mg.           Core Components/Risk Factors/Patient Goals Review:    Core Components/Risk Factors/Patient Goals at Discharge (Final Review):    ITP  Comments: ITP Comments    Row Name 08/08/18 0916   ITP Comments  Dr.Traci Turner, Medical Director       Comments: Patient attended orientation from [id number] to 1008  to review rules and guidelines for program. Completed 6 minute walk test, Intitial ITP, and exercise prescription.  VSS. Telemetry-v paced.   Pt c/o dizziness post  walk test. BP:  104/60.  Pt used rollator during walk test.   Relieved with rest and water.  Orthostatic VS:  123/60 sitting, 118/56 standing.  Pt asymptomatic.  Pt wife voiced concern about new onset hypotension with adding amlodipine.  BP normal ranges discussed with pt wife.  Understanding verbalized.  Will continue to monitor.  Andi Hence, RN, BSN Cardiac Pulmonary Rehab 08/08/18  12:18 PM

## 2018-08-08 NOTE — Progress Notes (Signed)
[name] 84 y.o. male DOB: 05/23/1932 MRN: [id number]      Nutrition Note  1. 05/30/2018 S/P TAVR (transcatheter aortic valve replacement)    Past Medical History:  Diagnosis Date  . Arthritis    "right arm/shoulder" (02/10/2016)  . Complete heart block ([place]) 02/10/2016   a. s/p Emergency planning/management officer  . Goiter   . Hypercholesteremia   . Memory loss   . Nasal septal deviation   . Persistent atrial fibrillation   . Presence of permanent cardiac pacemaker   . 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 Severe aortic stenosis   . Tremor    Meds reviewed.    Current Outpatient Medications (Cardiovascular):  .  amLODipine (NORVASC) 5 MG tablet, Take 1 tablet (5 mg total) by mouth daily. Marland Kitchen  atorvastatin (LIPITOR) 40 MG tablet, Take 1 tablet (40 mg total) by mouth daily.  Current Outpatient Medications (Respiratory):  .  ipratropium (ATROVENT) 0.06 % nasal spray, Place 1-2 sprays into both nostrils 4 (four) times daily as needed for rhinitis.  Current Outpatient Medications (Analgesics):  .  aspirin EC 81 MG tablet, Take 81 mg by mouth daily.  Current Outpatient Medications (Hematological):  .  apixaban (ELIQUIS) 5 MG TABS tablet, Take 1 tablet (5 mg total) by mouth 2 (two) times daily.  Current Outpatient Medications (Other):  .  amoxicillin (AMOXIL) 500 MG tablet, Take 4 tablets (2,000 mg) one hour prior to dental visits. .  Cholecalciferol (VITAMIN D-3) 1000 units CAPS, Take 1,000 Units by mouth daily.  Marland Kitchen  donepezil (ARICEPT) 10 MG tablet, Take 1 tablet (10 mg total) by mouth at bedtime. Marland Kitchen  L-Methylfolate-Algae-B12-B6 (METANX) 3-90.314-2-35 MG CAPS, Take 1 capsule by mouth 2 (two) times daily. .  memantine (NAMENDA) 10 MG tablet, Take 1 tablet (10 mg total) by mouth 2 (two) times daily. .  QUEtiapine (SEROQUEL) 25 MG tablet, Take 1 tablet (25 mg total)  by mouth at bedtime. .  sertraline (ZOLOFT) 50 MG tablet, Take 50 mg by mouth daily.  .  vitamin C (ASCORBIC ACID) 500 MG tablet, Take 500 mg by mouth daily.   HT: Ht Readings from Last 1 Encounters:  07/12/18 5\' 11"  (1.803 m)    WT: Wt Readings from Last 5 Encounters:  07/12/18 180 lb 12.8 oz (82 kg)  06/26/18 178 lb 12.8 oz (81.1 kg)  06/08/18 179 lb 6.4 oz (81.4 kg)  06/01/18 183 lb 13.8 oz (83.4 kg)  05/22/18 177 lb 12.8 oz (80.6 kg)     There is no height or weight on file to calculate BMI.   Current tobacco use? No  Labs:  Lipid Panel  No results found for: CHOL, TRIG, HDL, CHOLHDL, VLDL, LDLCALC, LDLDIRECT  Lab Results  Component Value Date   HGBA1C 5.5 05/22/2018   CBG (last 3)  No results for input(s): GLUCAP in the last 72 hours.  Nutrition Note Spoke with pt. Nutrition plan and goals reviewed with pt. Pt is following Step 1 of the Therapeutic Lifestyle Changes diet. Pt wants to learn more about how to eat heart healthy. Heart healthy tips reviewed (label reading, how to build a healthy plate, portion sizes, eating frequently across the day, heart healthy oils, complex vs refined carbohydrates). Discussed additional recipes and web sites for heart healthy recipes, gave handout to patient. Per discussion, pt does not use canned/convenience foods often. Pt does not  add salt to food. Pt does not eat out frequently. Pt expressed understanding of the information reviewed. Pt aware of nutrition education classes offered and would like to attend nutrition classes.  Nutrition Diagnosis ? Food-and nutrition-related knowledge deficit related to lack of exposure to information as related to diagnosis of: ? CVD   Nutrition Intervention ? Pt's individual nutrition plan and goals reviewed with pt.  Nutrition Goal(s):  ? Pt to identify and limit food sources of saturated fat, trans fat, refined carbohydrates and sodium ? Pt to eat complex carbs at breakfast ? Pt able to name  foods that affect blood glucose.  Plan:  ? Pt to attend nutrition classes ? Nutrition I ? Nutrition II ? Portion Distortion  ? Will provide client-centered nutrition education as part of interdisciplinary care ? Monitor and evaluate progress toward nutrition goal with team.   Laurina Bustle, MS, RD, LDN 08/08/2018 9:33 AM

## 2018-08-10 ENCOUNTER — Other Ambulatory Visit: Payer: Self-pay | Admitting: Physician Assistant

## 2018-08-10 MED ORDER — AMLODIPINE BESYLATE 5 MG PO TABS: 5.0000 mg | ORAL_TABLET | Freq: Every day | ORAL | 3 refills | Status: DC

## 2018-08-10 NOTE — Progress Notes (Signed)
Amlodipine 5mg  sent into [facility]. # 90 with 3 refills

## 2018-08-14 ENCOUNTER — Encounter (HOSPITAL_COMMUNITY): Payer: Self-pay

## 2018-08-14 ENCOUNTER — Encounter (HOSPITAL_COMMUNITY): Payer: Medicare Other

## 2018-08-14 ENCOUNTER — Encounter (HOSPITAL_COMMUNITY)
Admission: RE | Admit: 2018-08-14 | Discharge: 2018-08-14 | Disposition: A | Discharge status: Home / Self Care | Payer: Medicare Other | Source: Ambulatory Visit | Attending: Cardiovascular Disease | Admitting: Cardiovascular Disease

## 2018-08-14 DIAGNOSIS — Z952 Presence of prosthetic heart valve: Secondary | ICD-10-CM

## 2018-08-14 NOTE — Progress Notes (Signed)
Daily Session Note  Patient Details  Name: Jeffery Rogers MRN: [id number] Date of Birth: 1932/08/04 Referring Provider:     CARDIAC REHAB PHASE II ORIENTATION from 08/08/2018 in [facility]  Referring Provider  Val Riles MD      Encounter Date: 08/14/2018  Check In: Session Check In - 08/14/18 1200      Check-In   Supervising physician immediately available to respond to emergencies  [facility] immediately available    Physician(s)  Dr. Starla Link    Location  [facility]    Staff Present  Dorma Russell, MS,ACSM CEP, Exercise Physiologist;Kenslee Achorn Karle Starch, RN, [name], MS, ACSM CEP, Exercise Physiologist;Maria Whitaker, RN, BSN    Medication changes reported      No    Fall or balance concerns reported     No    Tobacco Cessation  No Change    Warm-up and Cool-down  Performed as group-led instruction   orientation    Resistance Training Performed  Yes    VAD Patient?  No    PAD/SET Patient?  No      Pain Assessment   Currently in Pain?  No/denies    Multiple Pain Sites  No       Capillary Blood Glucose: No results found for this or any previous visit (from the past 24 hour(s)).    Social History   Tobacco Use  Smoking Status Former Smoker  . Packs/day: 1.00  . Years: 16.00  . Pack years: 16.00  . Types: Cigarettes  Smokeless Tobacco Never Used  Tobacco Comment   Quit smoking cigarettes in 1971    Goals Met:  Exercise tolerated well  Goals Unmet:  Not Applicable  Comments: Pt started cardiac rehab today.  Pt tolerated light exercise without difficulty. VSS, telemetry-V Paced, asymptomatic.  Medication list reconciled. Pt denies barriers to medicaiton compliance.  PSYCHOSOCIAL ASSESSMENT:  PHQ-unable to complete due to patient's mental status. Pt exhibits positive coping skills, hopeful outlook with supportive family. Family present at today's session. No psychosocial needs identified at this  time, no psychosocial interventions necessary.   Pt oriented to exercise equipment and routine.    Understanding verbalized.    Dr. Fransico Him is Medical Director for Cardiac Rehab at [facility].

## 2018-08-16 ENCOUNTER — Encounter (HOSPITAL_COMMUNITY): Payer: Medicare Other

## 2018-08-16 ENCOUNTER — Encounter (HOSPITAL_COMMUNITY)
Admission: RE | Admit: 2018-08-16 | Discharge: 2018-08-16 | Disposition: A | Discharge status: Home / Self Care | Payer: Medicare Other | Source: Ambulatory Visit | Attending: Cardiovascular Disease | Admitting: Cardiovascular Disease

## 2018-08-16 DIAGNOSIS — Z952 Presence of prosthetic heart valve: Secondary | ICD-10-CM | POA: Diagnosis not present

## 2018-08-18 ENCOUNTER — Encounter (HOSPITAL_COMMUNITY): Payer: Medicare Other

## 2018-08-18 ENCOUNTER — Encounter (HOSPITAL_COMMUNITY)
Admission: RE | Admit: 2018-08-18 | Discharge: 2018-08-18 | Disposition: A | Discharge status: Home / Self Care | Payer: Medicare Other | Source: Ambulatory Visit | Attending: Cardiovascular Disease | Admitting: Cardiovascular Disease

## 2018-08-18 DIAGNOSIS — Z952 Presence of prosthetic heart valve: Secondary | ICD-10-CM

## 2018-08-21 ENCOUNTER — Encounter (HOSPITAL_COMMUNITY)
Admission: RE | Admit: 2018-08-21 | Discharge: 2018-08-21 | Disposition: A | Discharge status: Home / Self Care | Payer: Medicare Other | Source: Ambulatory Visit | Attending: Cardiovascular Disease | Admitting: Cardiovascular Disease

## 2018-08-21 ENCOUNTER — Encounter (HOSPITAL_COMMUNITY): Payer: Medicare Other

## 2018-08-21 DIAGNOSIS — Z952 Presence of prosthetic heart valve: Secondary | ICD-10-CM

## 2018-08-23 ENCOUNTER — Encounter (HOSPITAL_COMMUNITY): Payer: Medicare Other

## 2018-08-23 ENCOUNTER — Encounter (HOSPITAL_COMMUNITY)
Admission: RE | Admit: 2018-08-23 | Discharge: 2018-08-23 | Disposition: A | Discharge status: Home / Self Care | Payer: Medicare Other | Source: Ambulatory Visit | Attending: Cardiovascular Disease | Admitting: Cardiovascular Disease

## 2018-08-23 DIAGNOSIS — Z952 Presence of prosthetic heart valve: Secondary | ICD-10-CM

## 2018-08-24 NOTE — Progress Notes (Signed)
Cardiac Individual Treatment Plan  Patient Details  Name: Jeffery Rogers MRN: [id number] Date of Birth: Jun 19, 1932 Referring Provider:     CARDIAC REHAB PHASE II ORIENTATION from 08/08/2018 in [place]  Referring Provider  Val Riles MD      Initial Encounter Date:    CARDIAC REHAB PHASE II ORIENTATION from 08/08/2018 in [facility]  Date  08/08/18      Visit Diagnosis: 05/30/2018 S/P TAVR (transcatheter aortic valve replacement)  Patient's Home Medications on Admission:  Current Outpatient Medications:  .  amLODipine (NORVASC) 5 MG tablet, Take 1 tablet (5 mg total) by mouth daily., Disp: 90 tablet, Rfl: 3 .  amoxicillin (AMOXIL) 500 MG tablet, Take 4 tablets (2,000 mg) one hour prior to dental visits., Disp: 8 tablet, Rfl: 6 .  apixaban (ELIQUIS) 5 MG TABS tablet, Take 1 tablet (5 mg total) by mouth 2 (two) times daily., Disp: 180 tablet, Rfl: 3 .  aspirin EC 81 MG tablet, Take 81 mg by mouth daily., Disp: , Rfl:  .  atorvastatin (LIPITOR) 40 MG tablet, Take 1 tablet (40 mg total) by mouth daily., Disp: 90 tablet, Rfl: 2 .  Cholecalciferol (VITAMIN D-3) 1000 units CAPS, Take 1,000 Units by mouth daily. , Disp: , Rfl:  .  donepezil (ARICEPT) 10 MG tablet, Take 1 tablet (10 mg total) by mouth at bedtime., Disp: 90 tablet, Rfl: 4 .  ipratropium (ATROVENT) 0.06 % nasal spray, Place 1-2 sprays into both nostrils 4 (four) times daily as needed for rhinitis., Disp: , Rfl:  .  L-Methylfolate-Algae-B12-B6 (METANX) 3-90.314-2-35 MG CAPS, Take 1 capsule by mouth 2 (two) times daily., Disp: 180 capsule, Rfl: 4 .  memantine (NAMENDA) 10 MG tablet, Take 1 tablet (10 mg total) by mouth 2 (two) times daily., Disp: 180 tablet, Rfl: 4 .  QUEtiapine (SEROQUEL) 25 MG tablet, Take 1 tablet (25 mg total) by mouth at bedtime., Disp: 90 tablet, Rfl: 3 .  sertraline (ZOLOFT) 50 MG tablet, Take 50 mg by mouth daily. , Disp: , Rfl:  .   vitamin C (ASCORBIC ACID) 500 MG tablet, Take 500 mg by mouth daily., Disp: , Rfl:   Past Medical History: Past Medical History:  Diagnosis Date  . Arthritis    "right arm/shoulder" (02/10/2016)  . Complete heart block ([place]) 02/10/2016   a. s/p Emergency planning/management officer  . Goiter   . Hypercholesteremia   . Memory loss   . Nasal septal deviation   . Persistent atrial fibrillation   . Presence of permanent cardiac pacemaker   . 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 Severe aortic stenosis   . Tremor     Tobacco Use: Social History   Tobacco Use  Smoking Status Former Smoker  . Packs/day: 1.00  . Years: 16.00  . Pack years: 16.00  . Types: Cigarettes  Smokeless Tobacco Never Used  Tobacco Comment   Quit smoking cigarettes in 1971    Labs: Recent Review Flowsheet Data    Labs for ITP Cardiac and Pulmonary Rehab Latest Ref Rng & Units 05/22/2018 05/30/2018 05/30/2018 05/30/2018 05/30/2018   Hemoglobin A1c 4.8 - 5.6 % 5.5 - - - -   PHART 7.350 - 7.450 7.445 - - - -   PCO2ART 32.0 - 48.0 mmHg 34.3 - - - -   HCO3 20.0 - 28.0 mmol/L 23.2 - - - -  TCO2 22 - 32 mmol/L - 24 24 25 25    ACIDBASEDEF 0.0 - 2.0 mmol/L 0.4 - - - -   O2SAT % 97.9 - - - -      Capillary Blood Glucose: No results found for: GLUCAP   Exercise Target Goals: Exercise Program Goal: Individual exercise prescription set using results from initial 6 min walk test and THRR while considering  patient's activity barriers and safety.   Exercise Prescription Goal: Initial exercise prescription builds to 30-45 minutes a day of aerobic activity, 2-3 days per week.  Home exercise guidelines will be given to patient during program as part of exercise prescription that the participant will acknowledge.  Activity Barriers & Risk Stratification: Activity Barriers & Cardiac Risk Stratification -  08/08/18 1147      Activity Barriers & Cardiac Risk Stratification   Activity Barriers  Balance Concerns;Deconditioning    Cardiac Risk Stratification  High       6 Minute Walk: 6 Minute Walk    Row Name 08/08/18 1144         6 Minute Walk   Phase  Initial     Distance  855 feet     Walk Time  6 minutes     # of Rest Breaks  0     MPH  1.62     METS  0.9     RPE  9     Perceived Dyspnea   0     VO2 Peak  3.16     Symptoms  No     Resting HR  60 bpm     Resting BP  120/80     Resting Oxygen Saturation   98 %     Exercise Oxygen Saturation  during 6 min walk  99 %     Max Ex. HR  77 bpm     Max Ex. BP  104/60     2 Minute Post BP  118/56        Oxygen Initial Assessment:   Oxygen Re-Evaluation:   Oxygen Discharge (Final Oxygen Re-Evaluation):   Initial Exercise Prescription: Initial Exercise Prescription - 08/08/18 1100      Date of Initial Exercise RX and Referring Provider   Date  08/08/18    Referring Provider  Val Riles MD    Expected Discharge Date  11/13/18      T5 Nustep   Level  1    SPM  75    Minutes  20    METs  1      Track   Laps  4    Minutes  10    METs  0.9      Prescription Details   Frequency (times per week)  3x    Duration  Progress to 30 minutes of continuous aerobic without signs/symptoms of physical distress      Intensity   THRR 40-80% of Max Heartrate  54-108    Ratings of Perceived Exertion  11-13    Perceived Dyspnea  0-4      Progression   Progression  Continue progressive overload as per policy without signs/symptoms or physical distress.      Resistance Training   Training Prescription  Yes    Weight  2lbs    Reps  10-15       Perform Capillary Blood Glucose checks as needed.  Exercise Prescription Changes: Exercise Prescription Changes    Row Name 08/14/18 1600 08/23/18 1333  Response to Exercise   Blood Pressure (Admit)  108/70  117/72      Blood Pressure (Exercise)  118/68   118/58      Blood Pressure (Exit)  110/64  112/60      Heart Rate (Admit)  70 bpm  56 bpm      Heart Rate (Exercise)  110 bpm  104 bpm      Heart Rate (Exit)  60 bpm  60 bpm      Rating of Perceived Exertion (Exercise)  11  11      Perceived Dyspnea (Exercise)  0  0      Symptoms  None  None      Comments  Pt oriented to exercise equipment  None      Duration  Progress to 30 minutes of  aerobic without signs/symptoms of physical distress  Progress to 30 minutes of  aerobic without signs/symptoms of physical distress      Intensity  THRR unchanged  THRR unchanged        Progression   Progression  Continue to progress workloads to maintain intensity without signs/symptoms of physical distress.  Continue to progress workloads to maintain intensity without signs/symptoms of physical distress.      Average METs  2.54  2.4        Resistance Training   Training Prescription  Yes  No      Weight  2lbs  -      Reps  10-15  -      Time  10 Minutes  -        Interval Training   Interval Training  No  No        T5 Nustep   Level  1  3      SPM  85  95      Minutes  20  20      METs  2.5  3        Track   Laps  9  12      Minutes  10  10      METs  2.53  3.07         Exercise Comments: Exercise Comments    Row Name 08/14/18 1624           Exercise Comments  Pt's first day of exercise. Pt oriented to exercise equipment. Pt responded well to workloads, will continue to monitor and progress pt as tolerated.           Exercise Goals and Review: Exercise Goals    Row Name 08/08/18 1146             Exercise Goals   Increase Physical Activity  Yes       Intervention  Provide advice, education, support and counseling about physical activity/exercise needs.;Develop an individualized exercise prescription for aerobic and resistive training based on initial evaluation findings, risk stratification, comorbidities and participant's personal goals.       Expected Outcomes  Short  Term: Attend rehab on a regular basis to increase amount of physical activity.;Long Term: Add in home exercise to make exercise part of routine and to increase amount of physical activity.;Long Term: Exercising regularly at least 3-5 days a week.       Increase Strength and Stamina  Yes       Intervention  Provide advice, education, support and counseling about physical activity/exercise needs.;Develop an individualized exercise prescription for aerobic and resistive training based on initial evaluation  findings, risk stratification, comorbidities and participant's personal goals.       Expected Outcomes  Short Term: Perform resistance training exercises routinely during rehab and add in resistance training at home;Short Term: Increase workloads from initial exercise prescription for resistance, speed, and METs.;Long Term: Improve cardiorespiratory fitness, muscular endurance and strength as measured by increased METs and functional capacity (6MWT)       Able to understand and use rate of perceived exertion (RPE) scale  Yes       Intervention  Provide education and explanation on how to use RPE scale       Expected Outcomes  Short Term: Able to use RPE daily in rehab to express subjective intensity level;Long Term:  Able to use RPE to guide intensity level when exercising independently       Knowledge and understanding of Target Heart Rate Range (THRR)  Yes       Intervention  Provide education and explanation of THRR including how the numbers were predicted and where they are located for reference       Expected Outcomes  Short Term: Able to state/look up THRR;Long Term: Able to use THRR to govern intensity when exercising independently;Short Term: Able to use daily as guideline for intensity in rehab       Able to check pulse independently  Yes       Intervention  Provide education and demonstration on how to check pulse in carotid and radial arteries.;Review the importance of being able to check your  own pulse for safety during independent exercise       Expected Outcomes  Short Term: Able to explain why pulse checking is important during independent exercise;Long Term: Able to check pulse independently and accurately       Understanding of Exercise Prescription  Yes       Intervention  Provide education, explanation, and written materials on patient's individual exercise prescription       Expected Outcomes  Short Term: Able to explain program exercise prescription;Long Term: Able to explain home exercise prescription to exercise independently          Exercise Goals Re-Evaluation :   Discharge Exercise Prescription (Final Exercise Prescription Changes): Exercise Prescription Changes - 08/23/18 1333      Response to Exercise   Blood Pressure (Admit)  117/72    Blood Pressure (Exercise)  118/58    Blood Pressure (Exit)  112/60    Heart Rate (Admit)  56 bpm    Heart Rate (Exercise)  104 bpm    Heart Rate (Exit)  60 bpm    Rating of Perceived Exertion (Exercise)  11    Perceived Dyspnea (Exercise)  0    Symptoms  None    Comments  None    Duration  Progress to 30 minutes of  aerobic without signs/symptoms of physical distress    Intensity  THRR unchanged      Progression   Progression  Continue to progress workloads to maintain intensity without signs/symptoms of physical distress.    Average METs  2.4      Resistance Training   Training Prescription  No      Interval Training   Interval Training  No      T5 Nustep   Level  3    SPM  95    Minutes  20    METs  3      Track   Laps  12    Minutes  10  METs  3.07       Nutrition:  Target Goals: Understanding of nutrition guidelines, daily intake of sodium 1500mg , cholesterol 200mg , calories 30% from fat and 7% or less from saturated fats, daily to have 5 or more servings of fruits and vegetables.  Biometrics: Pre Biometrics - 08/08/18 1146      Pre Biometrics   Height  5\' 10"  (1.778 m)    Weight  80.9 kg     Waist Circumference  39 inches    Hip Circumference  41 inches    Waist to Hip Ratio  0.95 %    BMI (Calculated)  25.59    Triceps Skinfold  35 mm    % Body Fat  30.2 %    Grip Strength  38 kg    Flexibility  0 in    Single Leg Stand  0 seconds        Nutrition Therapy Plan and Nutrition Goals: Nutrition Therapy & Goals - 08/08/18 1042      Nutrition Therapy   Diet  general healthful      Personal Nutrition Goals   Nutrition Goal  Pt to identify and limit food sources of saturated fat, trans fat, refined carbohydrates and sodium    Personal Goal #2  Pt to eat complex carbs at breakfast    Personal Goal #3  Pt able to name foods that affect blood glucose      Intervention Plan   Intervention  Prescribe, educate and counsel regarding individualized specific dietary modifications aiming towards targeted core components such as weight, hypertension, lipid management, diabetes, heart failure and other comorbidities.    Expected Outcomes  Short Term Goal: Understand basic principles of dietary content, such as calories, fat, sodium, cholesterol and nutrients.;Long Term Goal: Adherence to prescribed nutrition plan.       Nutrition Assessments: Nutrition Assessments - 08/08/18 1041      MEDFICTS Scores   Pre Score  18       Nutrition Goals Re-Evaluation: Nutrition Goals Re-Evaluation    Row Name 08/08/18 4098             Personal Goal #3 Re-Evaluation   Personal Goal #3  Pt able to name foods that affect blood glucose          Nutrition Goals Re-Evaluation: Nutrition Goals Re-Evaluation    [place] Name 08/08/18 1191             Personal Goal #3 Re-Evaluation   Personal Goal #3  Pt able to name foods that affect blood glucose          Nutrition Goals Discharge (Final Nutrition Goals Re-Evaluation): Nutrition Goals Re-Evaluation - 08/08/18 0937      Personal Goal #3 Re-Evaluation   Personal Goal #3  Pt able to name foods that affect blood glucose        Psychosocial: Target Goals: Acknowledge presence or absence of significant depression and/or stress, maximize coping skills, provide positive support system. Participant is able to verbalize types and ability to use techniques and skills needed for reducing stress and depression.  Initial Review & Psychosocial Screening: Initial Psych Review & Screening - 08/08/18 0917      Initial Review   Current issues with  Current Sleep Concerns;Current Anxiety/Panic   difficult to assess with dementia diagnosis     Family Dynamics   Good Support System?  Yes   spouse, children      Barriers   Psychosocial barriers to participate in program  Psychosocial barriers identified (see note)   dementia      Screening Interventions   Interventions  Encouraged to exercise       Quality of Life Scores: Quality of Life - 08/08/18 0919      Quality of Life   Select  --   unable to assess due to dementia      Scores of 19 and below usually indicate a poorer quality of life in these areas.  A difference of  2-3 points is a clinically meaningful difference.  A difference of 2-3 points in the total score of the Quality of Life Index has been associated with significant improvement in overall quality of life, self-image, physical symptoms, and general health in studies assessing change in quality of life.  PHQ-9: Recent Review Flowsheet Data    There is no flowsheet data to display.     Interpretation of Total Score  Total Score Depression Severity:  1-4 = Minimal depression, 5-9 = Mild depression, 10-14 = Moderate depression, 15-19 = Moderately severe depression, 20-27 = Severe depression   Psychosocial Evaluation and Intervention: Psychosocial Evaluation - 08/14/18 1215      Psychosocial Evaluation & Interventions   Interventions  Encouraged to exercise with the program and follow exercise prescription;Relaxation education    Comments  Sleep concerns were reported at orientation.  This is  difficult to assess with patient's dementia.  Pt enjoys participating in a social group that meets regularly on Tuesdays and Thursdays.     Expected Outcomes  Barbarann Ehlers will report lessened cocners with sleep.     Continue Psychosocial Services   Follow up required by staff       Psychosocial Re-Evaluation: Psychosocial Re-Evaluation    [place] Name 08/24/18 0745             Psychosocial Re-Evaluation   Current issues with  Current Sleep Concerns       Comments  No interventions necessary.       Expected Outcomes  Barbarann Ehlers will have less concerns of sleep.        Interventions  Encouraged to attend Cardiac Rehabilitation for the exercise       Continue Psychosocial Services   Follow up required by staff          Psychosocial Discharge (Final Psychosocial Re-Evaluation): Psychosocial Re-Evaluation - 08/24/18 0745      Psychosocial Re-Evaluation   Current issues with  Current Sleep Concerns    Comments  No interventions necessary.    Expected Outcomes  Barbarann Ehlers will have less concerns of sleep.     Interventions  Encouraged to attend Cardiac Rehabilitation for the exercise    Continue Psychosocial Services   Follow up required by staff       Vocational Rehabilitation: Provide vocational rehab assistance to qualifying candidates.   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 Participant will state understanding/return demonstration of topics presented.  Learning Barriers/Preferences: Learning Barriers/Preferences - 08/08/18 1139      Learning Barriers/Preferences   Learning Barriers  Hearing   Pt has dementia    Learning Preferences  Skilled Demonstration;Individual Instruction       Education  Topics: Count Your Pulse:  -Group instruction provided by verbal instruction,  demonstration, patient participation and written materials to support subject.  Instructors address importance of being able to find your pulse and how to count your pulse when at home without a heart monitor.  Patients get hands on experience counting their pulse with staff help and individually.   Heart Attack, Angina, and Risk Factor Modification:  -Group instruction provided by verbal instruction, video, and written materials to support subject.  Instructors address signs and symptoms of angina and heart attacks.    Also discuss risk factors for heart disease and how to make changes to improve heart health risk factors.   Functional Fitness:  -Group instruction provided by verbal instruction, demonstration, patient participation, and written materials to support subject.  Instructors address safety measures for doing things around the house.  Discuss how to get up and down off the floor, how to pick things up properly, how to safely get out of a chair without assistance, and balance training.   CARDIAC REHAB PHASE II EXERCISE from 08/18/2018 in [place]  Date  08/18/18  Instruction Review Code  2- Demonstrated Understanding      Meditation and Mindfulness:  -Group instruction provided by verbal instruction, patient participation, and written materials to support subject.  Instructor addresses importance of mindfulness and meditation practice to help reduce stress and improve awareness.  Instructor also leads participants through a meditation exercise.    Stretching for Flexibility and Mobility:  -Group instruction provided by verbal instruction, patient participation, and written materials to support subject.  Instructors lead participants through series of stretches that are designed to increase flexibility thus improving mobility.  These stretches are additional exercise for  major muscle groups that are typically performed during regular warm up and cool down.   Hands Only CPR:  -Group verbal, video, and participation provides a basic overview of AHA guidelines for community CPR. Role-play of emergencies allow participants the opportunity to practice calling for help and chest compression technique with discussion of AED use.   Hypertension: -Group verbal and written instruction that provides a basic overview of hypertension including the most recent diagnostic guidelines, risk factor reduction with self-care instructions and medication management.    Nutrition I class: Heart Healthy Eating:  -Group instruction provided by PowerPoint slides, verbal discussion, and written materials to support subject matter. The instructor gives an explanation and review of the Therapeutic Lifestyle Changes diet recommendations, which includes a discussion on lipid goals, dietary fat, sodium, fiber, plant stanol/sterol esters, sugar, and the components of a well-balanced, healthy diet.   Nutrition II class: Lifestyle Skills:  -Group instruction provided by PowerPoint slides, verbal discussion, and written materials to support subject matter. The instructor gives an explanation and review of label reading, grocery shopping for heart health, heart healthy recipe modifications, and ways to make healthier choices when eating out.   Diabetes Question & Answer:  -Group instruction provided by PowerPoint slides, verbal discussion, and written materials to support subject matter. The instructor gives an explanation and review of diabetes co-morbidities, pre- and post-prandial blood glucose goals, pre-exercise blood glucose goals, signs, symptoms, and treatment of hypoglycemia and hyperglycemia, and foot care basics.   Diabetes Blitz:  -Group instruction provided by PowerPoint slides, verbal discussion, and written materials to support subject matter. The instructor gives an explanation  and review of the physiology behind type 1 and type 2 diabetes, diabetes medications and rational behind using different medications, pre- and post-prandial blood glucose recommendations and Hemoglobin A1c goals, diabetes diet, and exercise  including blood glucose guidelines for exercising safely.    Portion Distortion:  -Group instruction provided by PowerPoint slides, verbal discussion, written materials, and food models to support subject matter. The instructor gives an explanation of serving size versus portion size, changes in portions sizes over the last 20 years, and what consists of a serving from each food group.   Stress Management:  -Group instruction provided by verbal instruction, video, and written materials to support subject matter.  Instructors review role of stress in heart disease and how to cope with stress positively.     Exercising on Your Own:  -Group instruction provided by verbal instruction, power point, and written materials to support subject.  Instructors discuss benefits of exercise, components of exercise, frequency and intensity of exercise, and end points for exercise.  Also discuss use of nitroglycerin and activating EMS.  Review options of places to exercise outside of rehab.  Review guidelines for sex with heart disease.   Cardiac Drugs I:  -Group instruction provided by verbal instruction and written materials to support subject.  Instructor reviews cardiac drug classes: antiplatelets, anticoagulants, beta blockers, and statins.  Instructor discusses reasons, side effects, and lifestyle considerations for each drug class.   CARDIAC REHAB PHASE II EXERCISE from 08/18/2018 in [facility]  Date  08/16/18  Educator  Pharmacist  Instruction Review Code  2- Demonstrated Understanding      Cardiac Drugs II:  -Group instruction provided by verbal instruction and written materials to support subject.  Instructor reviews cardiac drug  classes: angiotensin converting enzyme inhibitors (ACE-I), angiotensin II receptor blockers (ARBs), nitrates, and calcium channel blockers.  Instructor discusses reasons, side effects, and lifestyle considerations for each drug class.   Anatomy and Physiology of the Circulatory System:  Group verbal and written instruction and models provide basic cardiac anatomy and physiology, with the coronary electrical and arterial systems. Review of: AMI, Angina, Valve disease, Heart Failure, Peripheral Artery Disease, Cardiac Arrhythmia, Pacemakers, and the ICD.   Other Education:  -Group or individual verbal, written, or video instructions that support the educational goals of the cardiac rehab program.   Holiday Eating Survival Tips:  -Group instruction provided by PowerPoint slides, verbal discussion, and written materials to support subject matter. The instructor gives patients tips, tricks, and techniques to help them not only survive but enjoy the holidays despite the onslaught of food that accompanies the holidays.   Knowledge Questionnaire Score: Knowledge Questionnaire Score - 08/08/18 0919      Knowledge Questionnaire Score   Pre Score  20/24, quiz completed by pt wife        Core Components/Risk Factors/Patient Goals at Admission: Personal Goals and Risk Factors at Admission - 08/08/18 1141      Core Components/Risk Factors/Patient Goals on Admission    Weight Management  Yes;Weight Loss    Intervention  Weight Management: Develop a combined nutrition and exercise program designed to reach desired caloric intake, while maintaining appropriate intake of nutrient and fiber, sodium and fats, and appropriate energy expenditure required for the weight goal.;Weight Management: Provide education and appropriate resources to help participant work on and attain dietary goals.;Weight Management/Obesity: Establish reasonable short term and long term weight goals.    Admit Weight  178 lb 5.6 oz  (80.9 kg)    Goal Weight: Long Term  170 lb (77.1 kg)    Expected Outcomes  Short Term: Continue to assess and modify interventions until short term weight is achieved;Long Term: Adherence to nutrition  and physical activity/exercise program aimed toward attainment of established weight goal;Weight Loss: Understanding of general recommendations for a balanced deficit meal plan, which promotes 1-2 lb weight loss per week and includes a negative energy balance of [phone] kcal/d;Understanding recommendations for meals to include 15-35% energy as protein, 25-35% energy from fat, 35-60% energy from carbohydrates, less than 200mg  of dietary cholesterol, 20-35 gm of total fiber daily;Understanding of distribution of calorie intake throughout the day with the consumption of 4-5 meals/snacks    Hypertension  Yes    Intervention  Provide education on lifestyle modifcations including regular physical activity/exercise, weight management, moderate sodium restriction and increased consumption of fresh fruit, vegetables, and low fat dairy, alcohol moderation, and smoking cessation.;Monitor prescription use compliance.    Expected Outcomes  Short Term: Continued assessment and intervention until BP is < 140/33mm HG in hypertensive participants. < 130/49mm HG in hypertensive participants with diabetes, heart failure or chronic kidney disease.;Long Term: Maintenance of blood pressure at goal levels.    Lipids  Yes    Intervention  Provide education and support for participant on nutrition & aerobic/resistive exercise along with prescribed medications to achieve LDL 70mg , HDL >40mg .    Expected Outcomes  Short Term: Participant states understanding of desired cholesterol values and is compliant with medications prescribed. Participant is following exercise prescription and nutrition guidelines.;Long Term: Cholesterol controlled with medications as prescribed, with individualized exercise RX and with personalized nutrition  plan. Value goals: LDL < 70mg , HDL > 40 mg.       Core Components/Risk Factors/Patient Goals Review:  Goals and Risk Factor Review    Row Name 08/14/18 1217             Core Components/Risk Factors/Patient Goals Review   Personal Goals Review  Hypertension;Lipids;Weight Management/Obesity       Review  Pt with multiple CAD RFs willing to participate in CR exercise. Pt would like to increase his walking distance and comfort with walking.       Expected Outcomes  Pt will continue to participate in CR exercise, nutrition, and lifestyle modification opportunities.           Core Components/Risk Factors/Patient Goals at Discharge (Final Review):  Goals and Risk Factor Review - 08/14/18 1217      Core Components/Risk Factors/Patient Goals Review   Personal Goals Review  Hypertension;Lipids;Weight Management/Obesity    Review  Pt with multiple CAD RFs willing to participate in CR exercise. Pt would like to increase his walking distance and comfort with walking.    Expected Outcomes  Pt will continue to participate in CR exercise, nutrition, and lifestyle modification opportunities.        ITP Comments: ITP Comments    Row Name 08/08/18 0916 08/14/18 1523         ITP Comments  Dr.Traci Radford Pax, Medical Director   30 Day ITP Review.  Pt started exercise and tolerated exercise well.         Comments: See ITP Comments.

## 2018-08-25 ENCOUNTER — Encounter (HOSPITAL_COMMUNITY)
Admission: RE | Admit: 2018-08-25 | Discharge: 2018-08-25 | Disposition: A | Discharge status: Home / Self Care | Payer: Medicare Other | Source: Ambulatory Visit | Attending: Cardiovascular Disease | Admitting: Cardiovascular Disease

## 2018-08-25 ENCOUNTER — Encounter (HOSPITAL_COMMUNITY): Payer: Medicare Other

## 2018-08-25 DIAGNOSIS — Z952 Presence of prosthetic heart valve: Secondary | ICD-10-CM

## 2018-08-28 ENCOUNTER — Encounter (HOSPITAL_COMMUNITY)
Admission: RE | Admit: 2018-08-28 | Discharge: 2018-08-28 | Disposition: A | Discharge status: Home / Self Care | Payer: Medicare Other | Source: Ambulatory Visit | Attending: Cardiovascular Disease | Admitting: Cardiovascular Disease

## 2018-08-28 ENCOUNTER — Encounter (HOSPITAL_COMMUNITY): Payer: Medicare Other

## 2018-08-28 DIAGNOSIS — Z952 Presence of prosthetic heart valve: Secondary | ICD-10-CM | POA: Diagnosis not present

## 2018-08-30 ENCOUNTER — Encounter (HOSPITAL_COMMUNITY): Payer: Medicare Other

## 2018-09-01 ENCOUNTER — Encounter (HOSPITAL_COMMUNITY): Payer: Medicare Other

## 2018-09-04 ENCOUNTER — Encounter (HOSPITAL_COMMUNITY)
Admission: RE | Admit: 2018-09-04 | Discharge: 2018-09-04 | Disposition: A | Discharge status: Home / Self Care | Payer: Medicare Other | Source: Ambulatory Visit | Attending: Cardiovascular Disease | Admitting: Cardiovascular Disease

## 2018-09-04 ENCOUNTER — Encounter (HOSPITAL_COMMUNITY): Payer: Medicare Other

## 2018-09-04 DIAGNOSIS — Z952 Presence of prosthetic heart valve: Secondary | ICD-10-CM | POA: Insufficient documentation

## 2018-09-06 ENCOUNTER — Encounter (HOSPITAL_COMMUNITY): Payer: Medicare Other

## 2018-09-06 ENCOUNTER — Encounter (HOSPITAL_COMMUNITY)
Admission: RE | Admit: 2018-09-06 | Discharge: 2018-09-06 | Disposition: A | Discharge status: Home / Self Care | Payer: Medicare Other | Source: Ambulatory Visit | Attending: Cardiovascular Disease | Admitting: Cardiovascular Disease

## 2018-09-06 DIAGNOSIS — Z952 Presence of prosthetic heart valve: Secondary | ICD-10-CM | POA: Diagnosis not present

## 2018-09-08 ENCOUNTER — Encounter (HOSPITAL_COMMUNITY): Payer: Medicare Other

## 2018-09-08 ENCOUNTER — Encounter (HOSPITAL_COMMUNITY)
Admission: RE | Admit: 2018-09-08 | Discharge: 2018-09-08 | Disposition: A | Discharge status: Home / Self Care | Payer: Medicare Other | Source: Ambulatory Visit | Attending: Cardiovascular Disease | Admitting: Cardiovascular Disease

## 2018-09-08 DIAGNOSIS — Z952 Presence of prosthetic heart valve: Secondary | ICD-10-CM | POA: Diagnosis not present

## 2018-09-08 NOTE — Progress Notes (Signed)
I have reviewed a Home Exercise Prescription with [name] wife . Pt has dementia and unable to review HEP with pt. Elbert is not currently exercising at home. The patient's was advised to walk or visit local [employer] 2-3 days a week for 30-45 minutes. Pt also has a stationary bike at home, but does not like being told to use or wants to use it. [name] states she has a hard time getting pt to visit [employer], because he gives her pushback. Wife states pt does not give her a hard time when coming to rehab and states he likes it here. We spoke about Nustep and pt liking that machine to try at [facility]. The patient's wife stated that they understand the exercise prescription. We reviewed exercise guidelines, target heart rate during exercise, RPE Scale, weather conditions, NTG use, endpoints for exercise, warmup and cool down.  Patient's wife  is encouraged to come to me with any questions. I will continue to follow up with the patient to assist them with progression and safety.     Carma Lair MS, ACSM CEP. 3:44 PM 09/08/2018

## 2018-09-11 ENCOUNTER — Encounter (HOSPITAL_COMMUNITY)
Admission: RE | Admit: 2018-09-11 | Discharge: 2018-09-11 | Disposition: A | Discharge status: Home / Self Care | Payer: Medicare Other | Source: Ambulatory Visit | Attending: Cardiovascular Disease | Admitting: Cardiovascular Disease

## 2018-09-11 ENCOUNTER — Encounter (HOSPITAL_COMMUNITY): Payer: Medicare Other

## 2018-09-11 DIAGNOSIS — Z952 Presence of prosthetic heart valve: Secondary | ICD-10-CM

## 2018-09-13 ENCOUNTER — Ambulatory Visit (INDEPENDENT_AMBULATORY_CARE_PROVIDER_SITE_OTHER): Payer: Medicare Other

## 2018-09-13 ENCOUNTER — Encounter (HOSPITAL_COMMUNITY): Payer: Medicare Other

## 2018-09-13 ENCOUNTER — Encounter (HOSPITAL_COMMUNITY)
Admission: RE | Admit: 2018-09-13 | Discharge: 2018-09-13 | Disposition: A | Discharge status: Home / Self Care | Payer: Medicare Other | Source: Ambulatory Visit | Attending: Cardiovascular Disease | Admitting: Cardiovascular Disease

## 2018-09-13 DIAGNOSIS — I442 Atrioventricular block, complete: Secondary | ICD-10-CM | POA: Diagnosis not present

## 2018-09-13 DIAGNOSIS — Z952 Presence of prosthetic heart valve: Secondary | ICD-10-CM | POA: Diagnosis not present

## 2018-09-13 DIAGNOSIS — R001 Bradycardia, unspecified: Secondary | ICD-10-CM

## 2018-09-14 NOTE — Progress Notes (Signed)
Cardiac Individual Treatment Plan  Patient Details  Name: Jeffery Rogers MRN: [id number] Date of Birth: 16-Apr-1932 Referring Provider:     CARDIAC REHAB PHASE II ORIENTATION from 08/08/2018 in [place]  Referring Provider  Val Riles MD      Initial Encounter Date:    CARDIAC REHAB PHASE II ORIENTATION from 08/08/2018 in [place]  Date  08/08/18      Visit Diagnosis: 05/30/2018 S/P TAVR (transcatheter aortic valve replacement)  Patient's Home Medications on Admission:  Current Outpatient Medications:  .  amLODipine (NORVASC) 5 MG tablet, Take 1 tablet (5 mg total) by mouth daily., Disp: 90 tablet, Rfl: 3 .  amoxicillin (AMOXIL) 500 MG tablet, Take 4 tablets (2,000 mg) one hour prior to dental visits., Disp: 8 tablet, Rfl: 6 .  apixaban (ELIQUIS) 5 MG TABS tablet, Take 1 tablet (5 mg total) by mouth 2 (two) times daily., Disp: 180 tablet, Rfl: 3 .  aspirin EC 81 MG tablet, Take 81 mg by mouth daily., Disp: , Rfl:  .  atorvastatin (LIPITOR) 40 MG tablet, Take 1 tablet (40 mg total) by mouth daily., Disp: 90 tablet, Rfl: 2 .  Cholecalciferol (VITAMIN D-3) 1000 units CAPS, Take 1,000 Units by mouth daily. , Disp: , Rfl:  .  donepezil (ARICEPT) 10 MG tablet, Take 1 tablet (10 mg total) by mouth at bedtime., Disp: 90 tablet, Rfl: 4 .  ipratropium (ATROVENT) 0.06 % nasal spray, Place 1-2 sprays into both nostrils 4 (four) times daily as needed for rhinitis., Disp: , Rfl:  .  L-Methylfolate-Algae-B12-B6 (METANX) 3-90.314-2-35 MG CAPS, Take 1 capsule by mouth 2 (two) times daily., Disp: 180 capsule, Rfl: 4 .  memantine (NAMENDA) 10 MG tablet, Take 1 tablet (10 mg total) by mouth 2 (two) times daily., Disp: 180 tablet, Rfl: 4 .  QUEtiapine (SEROQUEL) 25 MG tablet, Take 1 tablet (25 mg total) by mouth at bedtime., Disp: 90 tablet, Rfl: 3 .  sertraline (ZOLOFT) 50 MG tablet, Take 50 mg by mouth daily. , Disp: , Rfl:  .   vitamin C (ASCORBIC ACID) 500 MG tablet, Take 500 mg by mouth daily., Disp: , Rfl:   Past Medical History: Past Medical History:  Diagnosis Date  . Arthritis    "right arm/shoulder" (02/10/2016)  . Complete heart block ([place]) 02/10/2016   a. s/p Emergency planning/management officer  . Goiter   . Hypercholesteremia   . Memory loss   . Nasal septal deviation   . Persistent atrial fibrillation   . Presence of permanent cardiac pacemaker   . 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 Severe aortic stenosis   . Tremor     Tobacco Use: Social History   Tobacco Use  Smoking Status Former Smoker  . Packs/day: 1.00  . Years: 16.00  . Pack years: 16.00  . Types: Cigarettes  Smokeless Tobacco Never Used  Tobacco Comment   Quit smoking cigarettes in 1971    Labs: Recent Review Flowsheet Data    Labs for ITP Cardiac and Pulmonary Rehab Latest Ref Rng & Units 05/22/2018 05/30/2018 05/30/2018 05/30/2018 05/30/2018   Hemoglobin A1c 4.8 - 5.6 % 5.5 - - - -   PHART 7.350 - 7.450 7.445 - - - -   PCO2ART 32.0 - 48.0 mmHg 34.3 - - - -   HCO3 20.0 - 28.0 mmol/L 23.2 - - - -  TCO2 22 - 32 mmol/L - 24 24 25 25    ACIDBASEDEF 0.0 - 2.0 mmol/L 0.4 - - - -   O2SAT % 97.9 - - - -      Capillary Blood Glucose: No results found for: GLUCAP   Exercise Target Goals: Exercise Program Goal: Individual exercise prescription set using results from initial 6 min walk test and THRR while considering  patient's activity barriers and safety.   Exercise Prescription Goal: Initial exercise prescription builds to 30-45 minutes a day of aerobic activity, 2-3 days per week.  Home exercise guidelines will be given to patient during program as part of exercise prescription that the participant will acknowledge.  Activity Barriers & Risk Stratification: Activity Barriers & Cardiac Risk Stratification -  08/08/18 1147      Activity Barriers & Cardiac Risk Stratification   Activity Barriers  Balance Concerns;Deconditioning    Cardiac Risk Stratification  High       6 Minute Walk: 6 Minute Walk    Row Name 08/08/18 1144         6 Minute Walk   Phase  Initial     Distance  855 feet     Walk Time  6 minutes     # of Rest Breaks  0     MPH  1.62     METS  0.9     RPE  9     Perceived Dyspnea   0     VO2 Peak  3.16     Symptoms  No     Resting HR  60 bpm     Resting BP  120/80     Resting Oxygen Saturation   98 %     Exercise Oxygen Saturation  during 6 min walk  99 %     Max Ex. HR  77 bpm     Max Ex. BP  104/60     2 Minute Post BP  118/56        Oxygen Initial Assessment:   Oxygen Re-Evaluation:   Oxygen Discharge (Final Oxygen Re-Evaluation):   Initial Exercise Prescription: Initial Exercise Prescription - 08/08/18 1100      Date of Initial Exercise RX and Referring Provider   Date  08/08/18    Referring Provider  Val Riles MD    Expected Discharge Date  11/13/18      T5 Nustep   Level  1    SPM  75    Minutes  20    METs  1      Track   Laps  4    Minutes  10    METs  0.9      Prescription Details   Frequency (times per week)  3x    Duration  Progress to 30 minutes of continuous aerobic without signs/symptoms of physical distress      Intensity   THRR 40-80% of Max Heartrate  54-108    Ratings of Perceived Exertion  11-13    Perceived Dyspnea  0-4      Progression   Progression  Continue progressive overload as per policy without signs/symptoms or physical distress.      Resistance Training   Training Prescription  Yes    Weight  2lbs    Reps  10-15       Perform Capillary Blood Glucose checks as needed.  Exercise Prescription Changes: Exercise Prescription Changes    Row Name 08/14/18 1600 08/23/18 1333 08/28/18 1031 09/04/18 1346 09/11/18  1600     Response to Exercise   Blood Pressure (Admit)  108/70  117/72  122/78   122/70  104/64   Blood Pressure (Exercise)  118/68  118/58  114/68  118/64  120/66   Blood Pressure (Exit)  110/64  112/60  97/60  108/70  130/70   Heart Rate (Admit)  70 bpm  56 bpm  72 bpm  62 bpm  71 bpm   Heart Rate (Exercise)  110 bpm  104 bpm  92 bpm  102 bpm  109 bpm   Heart Rate (Exit)  60 bpm  60 bpm  70 bpm  60 bpm  58 bpm   Rating of Perceived Exertion (Exercise)  11  11  11  11  11    Perceived Dyspnea (Exercise)  0  0  0  0  0   Symptoms  None  None  None  None  None   Comments  Pt oriented to exercise equipment  None  None  None  None   Duration  Progress to 30 minutes of  aerobic without signs/symptoms of physical distress  Progress to 30 minutes of  aerobic without signs/symptoms of physical distress  Progress to 30 minutes of  aerobic without signs/symptoms of physical distress  Progress to 30 minutes of  aerobic without signs/symptoms of physical distress  Continue with 30 min of aerobic exercise without signs/symptoms of physical distress.   Intensity  THRR unchanged  THRR unchanged  THRR unchanged  THRR unchanged  THRR unchanged     Progression   Progression  Continue to progress workloads to maintain intensity without signs/symptoms of physical distress.  Continue to progress workloads to maintain intensity without signs/symptoms of physical distress.  Continue to progress workloads to maintain intensity without signs/symptoms of physical distress.  Continue to progress workloads to maintain intensity without signs/symptoms of physical distress.  Continue to progress workloads to maintain intensity without signs/symptoms of physical distress.   Average METs  2.54  2.4  2.5  2.9  2.77     Resistance Training   Training Prescription  Yes  No  Yes  No  No   Weight  2lbs  -  2lbs  -  -   Reps  10-15  -  10-15  -  -   Time  10 Minutes  -  10 Minutes  -  -     Interval Training   Interval Training  No  No  No  No  No     T5 Nustep   Level  1  3  4  4  4    SPM  85  95  95   105  105   Minutes  20  20  20  20  20    METs  2.5  3  2.8  2.9  2.8     Track   Laps  9  12  9  11  12    Minutes  10  10  10  10  10    METs  2.53  3.07  2.53  2.91  2.74     Home Exercise Plan   Plans to continue exercise at  -  -  -  -  Home (comment) Walking   Frequency  -  -  -  -  Add 2 additional days to program exercise sessions.   Initial Home Exercises Provided  -  -  -  -  09/08/18  Exercise Comments: Exercise Comments    Row Name 08/14/18 1624 09/08/18 1614         Exercise Comments  Pt's first day of exercise. Pt oriented to exercise equipment. Pt responded well to workloads, will continue to monitor and progress pt as tolerated.   Reviewed HEP with pt's wife. Pt is not exericising at home and give family attitude and pushback when they try and get him to go to the [facility]. Pt has dementia, but enjoys coming to Cardiac Rehab.          Exercise Goals and Review: Exercise Goals    Row Name 08/08/18 1146             Exercise Goals   Increase Physical Activity  Yes       Intervention  Provide advice, education, support and counseling about physical activity/exercise needs.;Develop an individualized exercise prescription for aerobic and resistive training based on initial evaluation findings, risk stratification, comorbidities and participant's personal goals.       Expected Outcomes  Short Term: Attend rehab on a regular basis to increase amount of physical activity.;Long Term: Add in home exercise to make exercise part of routine and to increase amount of physical activity.;Long Term: Exercising regularly at least 3-5 days a week.       Increase Strength and Stamina  Yes       Intervention  Provide advice, education, support and counseling about physical activity/exercise needs.;Develop an individualized exercise prescription for aerobic and resistive training based on initial evaluation findings, risk stratification, comorbidities and participant's personal goals.        Expected Outcomes  Short Term: Perform resistance training exercises routinely during rehab and add in resistance training at home;Short Term: Increase workloads from initial exercise prescription for resistance, speed, and METs.;Long Term: Improve cardiorespiratory fitness, muscular endurance and strength as measured by increased METs and functional capacity (6MWT)       Able to understand and use rate of perceived exertion (RPE) scale  Yes       Intervention  Provide education and explanation on how to use RPE scale       Expected Outcomes  Short Term: Able to use RPE daily in rehab to express subjective intensity level;Long Term:  Able to use RPE to guide intensity level when exercising independently       Knowledge and understanding of Target Heart Rate Range (THRR)  Yes       Intervention  Provide education and explanation of THRR including how the numbers were predicted and where they are located for reference       Expected Outcomes  Short Term: Able to state/look up THRR;Long Term: Able to use THRR to govern intensity when exercising independently;Short Term: Able to use daily as guideline for intensity in rehab       Able to check pulse independently  Yes       Intervention  Provide education and demonstration on how to check pulse in carotid and radial arteries.;Review the importance of being able to check your own pulse for safety during independent exercise       Expected Outcomes  Short Term: Able to explain why pulse checking is important during independent exercise;Long Term: Able to check pulse independently and accurately       Understanding of Exercise Prescription  Yes       Intervention  Provide education, explanation, and written materials on patient's individual exercise prescription       Expected Outcomes  Short Term: Able to explain program exercise prescription;Long Term: Able to explain home exercise prescription to exercise independently          Exercise Goals  Re-Evaluation : Exercise Goals Re-Evaluation    Row Name 09/08/18 1615             Exercise Goal Re-Evaluation   Exercise Goals Review  Increase Physical Activity;Able to understand and use rate of perceived exertion (RPE) scale;Knowledge and understanding of Target Heart Rate Range (THRR);Understanding of Exercise Prescription;Increase Strength and Stamina;Able to check pulse independently       Comments  Reviewed HEP with pt's wife, due to pt having dementia. Spoke with wife about the importance of exercising most days of the week. Spoke with pt's wife about pt using [employer] and possibly using that at [employer].        Expected Outcomes  Family will try and continue to work with pt to try and exercise at home. Family has a hard time getting pt to exercise at home due to resistance from pt. Will continue to help family with pt exercising on his own.           Discharge Exercise Prescription (Final Exercise Prescription Changes): Exercise Prescription Changes - 09/11/18 1600      Response to Exercise   Blood Pressure (Admit)  104/64    Blood Pressure (Exercise)  120/66    Blood Pressure (Exit)  130/70    Heart Rate (Admit)  71 bpm    Heart Rate (Exercise)  109 bpm    Heart Rate (Exit)  58 bpm    Rating of Perceived Exertion (Exercise)  11    Perceived Dyspnea (Exercise)  0    Symptoms  None    Comments  None    Duration  Continue with 30 min of aerobic exercise without signs/symptoms of physical distress.    Intensity  THRR unchanged      Progression   Progression  Continue to progress workloads to maintain intensity without signs/symptoms of physical distress.    Average METs  2.77      Resistance Training   Training Prescription  No      Interval Training   Interval Training  No      T5 Nustep   Level  4    SPM  105    Minutes  20    METs  2.8      Track   Laps  12    Minutes  10    METs  2.74      Home Exercise Plan   Plans to continue exercise at  Home  (comment)   Walking   Frequency  Add 2 additional days to program exercise sessions.    Initial Home Exercises Provided  09/08/18       Nutrition:  Target Goals: Understanding of nutrition guidelines, daily intake of sodium 1500mg , cholesterol 200mg , calories 30% from fat and 7% or less from saturated fats, daily to have 5 or more servings of fruits and vegetables.  Biometrics: Pre Biometrics - 08/08/18 1146      Pre Biometrics   Height  5\' 10"  (1.778 m)    Weight  80.9 kg    Waist Circumference  39 inches    Hip Circumference  41 inches    Waist to Hip Ratio  0.95 %    BMI (Calculated)  25.59    Triceps Skinfold  35 mm    % Body Fat  30.2 %  Grip Strength  38 kg    Flexibility  0 in    Single Leg Stand  0 seconds        Nutrition Therapy Plan and Nutrition Goals: Nutrition Therapy & Goals - 08/08/18 1042      Nutrition Therapy   Diet  general healthful      Personal Nutrition Goals   Nutrition Goal  Pt to identify and limit food sources of saturated fat, trans fat, refined carbohydrates and sodium    Personal Goal #2  Pt to eat complex carbs at breakfast    Personal Goal #3  Pt able to name foods that affect blood glucose      Intervention Plan   Intervention  Prescribe, educate and counsel regarding individualized specific dietary modifications aiming towards targeted core components such as weight, hypertension, lipid management, diabetes, heart failure and other comorbidities.    Expected Outcomes  Short Term Goal: Understand basic principles of dietary content, such as calories, fat, sodium, cholesterol and nutrients.;Long Term Goal: Adherence to prescribed nutrition plan.       Nutrition Assessments: Nutrition Assessments - 08/08/18 1041      MEDFICTS Scores   Pre Score  18       Nutrition Goals Re-Evaluation: Nutrition Goals Re-Evaluation    Row Name 08/08/18 2951             Personal Goal #3 Re-Evaluation   Personal Goal #3  Pt able to name  foods that affect blood glucose          Nutrition Goals Re-Evaluation: Nutrition Goals Re-Evaluation    [place] Name 08/08/18 8841             Personal Goal #3 Re-Evaluation   Personal Goal #3  Pt able to name foods that affect blood glucose          Nutrition Goals Discharge (Final Nutrition Goals Re-Evaluation): Nutrition Goals Re-Evaluation - 08/08/18 0937      Personal Goal #3 Re-Evaluation   Personal Goal #3  Pt able to name foods that affect blood glucose       Psychosocial: Target Goals: Acknowledge presence or absence of significant depression and/or stress, maximize coping skills, provide positive support system. Participant is able to verbalize types and ability to use techniques and skills needed for reducing stress and depression.  Initial Review & Psychosocial Screening: Initial Psych Review & Screening - 08/08/18 0917      Initial Review   Current issues with  Current Sleep Concerns;Current Anxiety/Panic   difficult to assess with dementia diagnosis     Family Dynamics   Good Support System?  Yes   spouse, children      Barriers   Psychosocial barriers to participate in program  Psychosocial barriers identified (see note)   dementia      Screening Interventions   Interventions  Encouraged to exercise       Quality of Life Scores: Quality of Life - 08/08/18 0919      Quality of Life   Select  --   unable to assess due to dementia      Scores of 19 and below usually indicate a poorer quality of life in these areas.  A difference of  2-3 points is a clinically meaningful difference.  A difference of 2-3 points in the total score of the Quality of Life Index has been associated with significant improvement in overall quality of life, self-image, physical symptoms, and general health in studies assessing change in  quality of life.  PHQ-9: Recent Review Flowsheet Data    There is no flowsheet data to display.     Interpretation of Total Score   Total Score Depression Severity:  1-4 = Minimal depression, 5-9 = Mild depression, 10-14 = Moderate depression, 15-19 = Moderately severe depression, 20-27 = Severe depression   Psychosocial Evaluation and Intervention: Psychosocial Evaluation - 08/14/18 1215      Psychosocial Evaluation & Interventions   Interventions  Encouraged to exercise with the program and follow exercise prescription;Relaxation education    Comments  Sleep concerns were reported at orientation.  This is difficult to assess with patient's dementia.  Pt enjoys participating in a social group that meets regularly on Tuesdays and Thursdays.     Expected Outcomes  Jeffery Rogers will report lessened cocners with sleep.     Continue Psychosocial Services   Follow up required by staff       Psychosocial Re-Evaluation: Psychosocial Re-Evaluation    [place] Name 08/24/18 0745 09/14/18 1224           Psychosocial Re-Evaluation   Current issues with  Current Sleep Concerns  None Identified      Comments  No interventions necessary.  No interventions necessary.      Expected Outcomes  Jeffery Rogers will have less concerns of sleep.   Jeffery Rogers will maintain a positive outlook.       Interventions  Encouraged to attend Cardiac Rehabilitation for the exercise  Encouraged to attend Cardiac Rehabilitation for the exercise      Continue Psychosocial Services   Follow up required by staff  No Follow up required         Psychosocial Discharge (Final Psychosocial Re-Evaluation): Psychosocial Re-Evaluation - 09/14/18 1224      Psychosocial Re-Evaluation   Current issues with  None Identified    Comments  No interventions necessary.    Expected Outcomes  Jeffery Rogers will maintain a positive outlook.     Interventions  Encouraged to attend Cardiac Rehabilitation for the exercise    Continue Psychosocial Services   No Follow up required       Vocational Rehabilitation: Provide vocational rehab assistance to qualifying candidates.   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 Participant will state understanding/return demonstration of topics presented.  Learning Barriers/Preferences: Learning Barriers/Preferences - 08/08/18 1139      Learning Barriers/Preferences   Learning Barriers  Hearing   Pt has dementia    Learning Preferences  Skilled Demonstration;Individual Instruction       Education Topics: Count Your Pulse:  -Group instruction provided by verbal instruction, demonstration, patient participation and written materials to support subject.  Instructors address importance of being able to find your pulse and how to count your pulse when at home without a heart monitor.  Patients get hands on experience counting their pulse with staff help and individually.   Heart Attack, Angina, and Risk Factor Modification:  -Group instruction provided by verbal instruction, video, and written materials to support subject.  Instructors address signs and symptoms of angina and heart attacks.    Also discuss risk factors for heart disease and how to make changes to improve heart health risk factors.   CARDIAC REHAB PHASE II  EXERCISE from 09/13/2018 in [place]  Date  09/13/18  Educator  RN  Instruction Review Code  2- Demonstrated Understanding      Functional Fitness:  -Group instruction provided by verbal instruction, demonstration, patient participation, and written materials to support subject.  Instructors address safety measures for doing things around the house.  Discuss how to get up and down off the floor, how to pick things up properly, how to safely get out of a chair without assistance, and balance training.    CARDIAC REHAB PHASE II EXERCISE from 09/13/2018 in [place]  Date  08/18/18  Instruction Review Code  2- Demonstrated Understanding      Meditation and Mindfulness:  -Group instruction provided by verbal instruction, patient participation, and written materials to support subject.  Instructor addresses importance of mindfulness and meditation practice to help reduce stress and improve awareness.  Instructor also leads participants through a meditation exercise.    Stretching for Flexibility and Mobility:  -Group instruction provided by verbal instruction, patient participation, and written materials to support subject.  Instructors lead participants through series of stretches that are designed to increase flexibility thus improving mobility.  These stretches are additional exercise for major muscle groups that are typically performed during regular warm up and cool down.   Hands Only CPR:  -Group verbal, video, and participation provides a basic overview of AHA guidelines for community CPR. Role-play of emergencies allow participants the opportunity to practice calling for help and chest compression technique with discussion of AED use.   Hypertension: -Group verbal and written instruction that provides a basic overview of hypertension including the most recent diagnostic guidelines, risk factor reduction with self-care instructions and medication management.    Nutrition I class: Heart Healthy Eating:  -Group instruction provided by PowerPoint slides, verbal discussion, and written materials to support subject matter. The instructor gives an explanation and review of the Therapeutic Lifestyle Changes diet recommendations, which includes a discussion on lipid goals, dietary fat, sodium, fiber, plant stanol/sterol esters, sugar, and the components of a well-balanced, healthy diet.   Nutrition II class: Lifestyle Skills:  -Group instruction provided by  PowerPoint slides, verbal discussion, and written materials to support subject matter. The instructor gives an explanation and review of label reading, grocery shopping for heart health, heart healthy recipe modifications, and ways to make healthier choices when eating out.   Diabetes Question & Answer:  -Group instruction provided by PowerPoint slides, verbal discussion, and written materials to support subject matter. The instructor gives an explanation and review of diabetes co-morbidities, pre- and post-prandial blood glucose goals, pre-exercise blood glucose goals, signs, symptoms, and treatment of hypoglycemia and hyperglycemia, and foot care basics.   Diabetes Blitz:  -Group instruction provided by PowerPoint slides, verbal discussion, and written materials to support subject matter. The instructor gives an explanation and review of the physiology behind type 1 and type 2 diabetes, diabetes medications and rational behind using different medications, pre- and post-prandial blood glucose recommendations and Hemoglobin A1c goals, diabetes diet, and exercise including blood glucose guidelines for exercising safely.    Portion Distortion:  -Group instruction provided by PowerPoint slides, verbal discussion, written materials, and food models to support subject matter. The instructor gives an explanation of serving size versus portion size, changes in portions sizes over the last 20 years, and what consists of a serving from each food group.   Stress Management:  -Group instruction provided by verbal instruction, video, and  written materials to support subject matter.  Instructors review role of stress in heart disease and how to cope with stress positively.     Exercising on Your Own:  -Group instruction provided by verbal instruction, power point, and written materials to support subject.  Instructors discuss benefits of exercise, components of exercise, frequency and intensity of exercise,  and end points for exercise.  Also discuss use of nitroglycerin and activating EMS.  Review options of places to exercise outside of rehab.  Review guidelines for sex with heart disease.   Cardiac Drugs I:  -Group instruction provided by verbal instruction and written materials to support subject.  Instructor reviews cardiac drug classes: antiplatelets, anticoagulants, beta blockers, and statins.  Instructor discusses reasons, side effects, and lifestyle considerations for each drug class.   CARDIAC REHAB PHASE II EXERCISE from 09/13/2018 in [place]  Date  08/16/18  Educator  Pharmacist  Instruction Review Code  2- Demonstrated Understanding      Cardiac Drugs II:  -Group instruction provided by verbal instruction and written materials to support subject.  Instructor reviews cardiac drug classes: angiotensin converting enzyme inhibitors (ACE-I), angiotensin II receptor blockers (ARBs), nitrates, and calcium channel blockers.  Instructor discusses reasons, side effects, and lifestyle considerations for each drug class.   CARDIAC REHAB PHASE II EXERCISE from 09/13/2018 in [place]  Date  09/06/18  Educator  PharmD  Instruction Review Code  2- Demonstrated Understanding      Anatomy and Physiology of the Circulatory System:  Group verbal and written instruction and models provide basic cardiac anatomy and physiology, with the coronary electrical and arterial systems. Review of: AMI, Angina, Valve disease, Heart Failure, Peripheral Artery Disease, Cardiac Arrhythmia, Pacemakers, and the ICD.   Other Education:  -Group or individual verbal, written, or video instructions that support the educational goals of the cardiac rehab program.   Holiday Eating Survival Tips:  -Group instruction provided by PowerPoint slides, verbal discussion, and written materials to support subject matter. The instructor gives patients tips,  tricks, and techniques to help them not only survive but enjoy the holidays despite the onslaught of food that accompanies the holidays.   Knowledge Questionnaire Score: Knowledge Questionnaire Score - 08/08/18 0919      Knowledge Questionnaire Score   Pre Score  20/24, quiz completed by pt wife        Core Components/Risk Factors/Patient Goals at Admission: Personal Goals and Risk Factors at Admission - 08/08/18 1141      Core Components/Risk Factors/Patient Goals on Admission    Weight Management  Yes;Weight Loss    Intervention  Weight Management: Develop a combined nutrition and exercise program designed to reach desired caloric intake, while maintaining appropriate intake of nutrient and fiber, sodium and fats, and appropriate energy expenditure required for the weight goal.;Weight Management: Provide education and appropriate resources to help participant work on and attain dietary goals.;Weight Management/Obesity: Establish reasonable short term and long term weight goals.    Admit Weight  178 lb 5.6 oz (80.9 kg)    Goal Weight: Long Term  170 lb (77.1 kg)    Expected Outcomes  Short Term: Continue to assess and modify interventions until short term weight is achieved;Long Term: Adherence to nutrition and physical activity/exercise program aimed toward attainment of established weight goal;Weight Loss: Understanding of general recommendations for a balanced deficit meal plan, which promotes 1-2 lb weight loss per week and includes a negative energy balance of [phone]  kcal/d;Understanding recommendations for meals to include 15-35% energy as protein, 25-35% energy from fat, 35-60% energy from carbohydrates, less than 200mg  of dietary cholesterol, 20-35 gm of total fiber daily;Understanding of distribution of calorie intake throughout the day with the consumption of 4-5 meals/snacks    Hypertension  Yes    Intervention  Provide education on lifestyle modifcations including regular physical  activity/exercise, weight management, moderate sodium restriction and increased consumption of fresh fruit, vegetables, and low fat dairy, alcohol moderation, and smoking cessation.;Monitor prescription use compliance.    Expected Outcomes  Short Term: Continued assessment and intervention until BP is < 140/93mm HG in hypertensive participants. < 130/107mm HG in hypertensive participants with diabetes, heart failure or chronic kidney disease.;Long Term: Maintenance of blood pressure at goal levels.    Lipids  Yes    Intervention  Provide education and support for participant on nutrition & aerobic/resistive exercise along with prescribed medications to achieve LDL 70mg , HDL >40mg .    Expected Outcomes  Short Term: Participant states understanding of desired cholesterol values and is compliant with medications prescribed. Participant is following exercise prescription and nutrition guidelines.;Long Term: Cholesterol controlled with medications as prescribed, with individualized exercise RX and with personalized nutrition plan. Value goals: LDL < 70mg , HDL > 40 mg.       Core Components/Risk Factors/Patient Goals Review:  Goals and Risk Factor Review    Row Name 08/14/18 1217 09/14/18 1225           Core Components/Risk Factors/Patient Goals Review   Personal Goals Review  Hypertension;Lipids;Weight Management/Obesity  Hypertension;Lipids;Weight Management/Obesity      Review  Pt with multiple CAD RFs willing to participate in CR exercise. Pt would like to increase his walking distance and comfort with walking.  Pt with multiple CAD RFs willing to participate in CR exercise. Jeffery Rogers has been tolerating exercise with increasing workloads.       Expected Outcomes  Pt will continue to participate in CR exercise, nutrition, and lifestyle modification opportunities.   Pt will continue to participate in CR exercise, nutrition, and lifestyle modification opportunities.          Core Components/Risk  Factors/Patient Goals at Discharge (Final Review):  Goals and Risk Factor Review - 09/14/18 1225      Core Components/Risk Factors/Patient Goals Review   Personal Goals Review  Hypertension;Lipids;Weight Management/Obesity    Review  Pt with multiple CAD RFs willing to participate in CR exercise. Jeffery Rogers has been tolerating exercise with increasing workloads.     Expected Outcomes  Pt will continue to participate in CR exercise, nutrition, and lifestyle modification opportunities.        ITP Comments: ITP Comments    Row Name 08/08/18 1287 08/14/18 1523 09/14/18 1223       ITP Comments  Dr.Traci Radford Pax, Medical Director   30 Day ITP Review.  Pt started exercise and tolerated exercise well.  30 Day ITP Review.  Jeffery Rogers is tolerating exercise well with increased loads.  VSS.         Comments: See ITP Comments.

## 2018-09-14 NOTE — Progress Notes (Signed)
Remote pacemaker transmission.   

## 2018-09-15 ENCOUNTER — Encounter (HOSPITAL_COMMUNITY): Payer: Medicare Other

## 2018-09-15 ENCOUNTER — Encounter (HOSPITAL_COMMUNITY)
Admission: RE | Admit: 2018-09-15 | Discharge: 2018-09-15 | Disposition: A | Discharge status: Home / Self Care | Payer: Medicare Other | Source: Ambulatory Visit | Attending: Cardiovascular Disease | Admitting: Cardiovascular Disease

## 2018-09-15 ENCOUNTER — Encounter: Payer: Self-pay | Admitting: Cardiology

## 2018-09-15 DIAGNOSIS — Z952 Presence of prosthetic heart valve: Secondary | ICD-10-CM

## 2018-09-18 ENCOUNTER — Encounter (HOSPITAL_COMMUNITY): Payer: Medicare Other

## 2018-09-18 ENCOUNTER — Encounter (HOSPITAL_COMMUNITY)
Admission: RE | Admit: 2018-09-18 | Discharge: 2018-09-18 | Disposition: A | Discharge status: Home / Self Care | Payer: Medicare Other | Source: Ambulatory Visit | Attending: Cardiovascular Disease | Admitting: Cardiovascular Disease

## 2018-09-18 DIAGNOSIS — Z952 Presence of prosthetic heart valve: Secondary | ICD-10-CM

## 2018-09-18 NOTE — Progress Notes (Signed)
[name] 84 y.o. male Nutrition Note Spoke with pt. Nutrition plan and goals reviewed with pt. Pt is following a heart healthy diet. Pt wants to learn more about how to eat heart healthy. Heart healthy tips reviewed from orientation with patient and his daughter as patient has memory loss (label reading, how to build a healthy plate, portion sizes, eating frequently across the day, heart healthy oils, complex vs refined carbohydrates). Discussed additional recipes and web sites for heart healthy recipes, gave handout to patient. Per discussion, pt does not use canned/convenience foods often. Pt does not add salt to food. Pt does not eat out frequently. Pt expressed understanding of the information reviewed. Pt aware of nutrition education classes offered and would like to attend nutrition classes.  Lab Results  Component Value Date   HGBA1C 5.5 05/22/2018    Wt Readings from Last 3 Encounters:  08/08/18 178 lb 5.6 oz (80.9 kg)  07/12/18 180 lb 12.8 oz (82 kg)  06/26/18 178 lb 12.8 oz (81.1 kg)    Nutrition Diagnosis  Food-and nutrition-related knowledge deficit related to lack of exposure to information as related to diagnosis of: ? CVD   Nutrition Intervention ? Pt's individual nutrition plan reviewed with pt.  Goal(s)  Pt to identify and limit food sources of saturated fat, trans fat, refined carbohydrates and sodium  Pt to eat complex carbs at breakfast  Pt able to name foods that affect blood glucose.  Plan:   Pt to attend nutrition classes ? Nutrition I ? Nutrition II ? Portion Distortion   Will provide client-centered nutrition education as part of interdisciplinary care  Monitor and evaluate progress toward nutrition goal with team.    Laurina Bustle, MS, RD, LDN 09/18/2018 12:26 PM

## 2018-09-20 ENCOUNTER — Encounter (HOSPITAL_COMMUNITY): Payer: Medicare Other

## 2018-09-20 ENCOUNTER — Encounter (HOSPITAL_COMMUNITY)
Admission: RE | Admit: 2018-09-20 | Discharge: 2018-09-20 | Disposition: A | Discharge status: Home / Self Care | Payer: Medicare Other | Source: Ambulatory Visit | Attending: Cardiovascular Disease | Admitting: Cardiovascular Disease

## 2018-09-20 DIAGNOSIS — Z952 Presence of prosthetic heart valve: Secondary | ICD-10-CM

## 2018-09-22 ENCOUNTER — Encounter (HOSPITAL_COMMUNITY): Payer: Medicare Other

## 2018-09-22 ENCOUNTER — Encounter (HOSPITAL_COMMUNITY)
Admission: RE | Admit: 2018-09-22 | Discharge: 2018-09-22 | Disposition: A | Discharge status: Home / Self Care | Payer: Medicare Other | Source: Ambulatory Visit | Attending: Cardiovascular Disease | Admitting: Cardiovascular Disease

## 2018-09-22 DIAGNOSIS — Z952 Presence of prosthetic heart valve: Secondary | ICD-10-CM

## 2018-09-25 ENCOUNTER — Encounter (HOSPITAL_COMMUNITY): Payer: Medicare Other

## 2018-09-25 ENCOUNTER — Encounter (HOSPITAL_COMMUNITY)
Admission: RE | Admit: 2018-09-25 | Discharge: 2018-09-25 | Disposition: A | Discharge status: Home / Self Care | Payer: Medicare Other | Source: Ambulatory Visit | Attending: Cardiovascular Disease | Admitting: Cardiovascular Disease

## 2018-09-25 DIAGNOSIS — Z952 Presence of prosthetic heart valve: Secondary | ICD-10-CM | POA: Diagnosis not present

## 2018-09-29 ENCOUNTER — Encounter (HOSPITAL_COMMUNITY): Payer: Medicare Other

## 2018-09-29 ENCOUNTER — Encounter (HOSPITAL_COMMUNITY)
Admission: RE | Admit: 2018-09-29 | Discharge: 2018-09-29 | Disposition: A | Discharge status: Home / Self Care | Payer: Medicare Other | Source: Ambulatory Visit | Attending: Cardiovascular Disease | Admitting: Cardiovascular Disease

## 2018-09-29 DIAGNOSIS — Z952 Presence of prosthetic heart valve: Secondary | ICD-10-CM | POA: Diagnosis not present

## 2018-10-02 ENCOUNTER — Encounter (HOSPITAL_COMMUNITY)
Admission: RE | Admit: 2018-10-02 | Discharge: 2018-10-02 | Disposition: A | Discharge status: Home / Self Care | Payer: Medicare Other | Source: Ambulatory Visit | Attending: Cardiovascular Disease | Admitting: Cardiovascular Disease

## 2018-10-02 ENCOUNTER — Encounter (HOSPITAL_COMMUNITY): Payer: Medicare Other

## 2018-10-02 DIAGNOSIS — Z952 Presence of prosthetic heart valve: Secondary | ICD-10-CM

## 2018-10-06 ENCOUNTER — Encounter (HOSPITAL_COMMUNITY)
Admission: RE | Admit: 2018-10-06 | Discharge: 2018-10-06 | Disposition: A | Discharge status: Home / Self Care | Payer: Medicare Other | Source: Ambulatory Visit | Attending: Cardiovascular Disease | Admitting: Cardiovascular Disease

## 2018-10-06 ENCOUNTER — Encounter (HOSPITAL_COMMUNITY): Payer: Medicare Other

## 2018-10-06 DIAGNOSIS — H833X3 Noise effects on inner ear, bilateral: Secondary | ICD-10-CM | POA: Diagnosis not present

## 2018-10-06 DIAGNOSIS — H903 Sensorineural hearing loss, bilateral: Secondary | ICD-10-CM | POA: Diagnosis not present

## 2018-10-06 DIAGNOSIS — Z952 Presence of prosthetic heart valve: Secondary | ICD-10-CM | POA: Insufficient documentation

## 2018-10-06 DIAGNOSIS — Z87891 Personal history of nicotine dependence: Secondary | ICD-10-CM | POA: Diagnosis not present

## 2018-10-06 DIAGNOSIS — H6123 Impacted cerumen, bilateral: Secondary | ICD-10-CM | POA: Diagnosis not present

## 2018-10-06 DIAGNOSIS — Z974 Presence of external hearing-aid: Secondary | ICD-10-CM | POA: Diagnosis not present

## 2018-10-06 DIAGNOSIS — H9113 Presbycusis, bilateral: Secondary | ICD-10-CM | POA: Diagnosis not present

## 2018-10-06 DIAGNOSIS — Z7289 Other problems related to lifestyle: Secondary | ICD-10-CM | POA: Diagnosis not present

## 2018-10-09 ENCOUNTER — Encounter (HOSPITAL_COMMUNITY)
Admission: RE | Admit: 2018-10-09 | Discharge: 2018-10-09 | Disposition: A | Discharge status: Home / Self Care | Payer: Medicare Other | Source: Ambulatory Visit | Attending: Cardiovascular Disease | Admitting: Cardiovascular Disease

## 2018-10-09 ENCOUNTER — Encounter (HOSPITAL_COMMUNITY): Payer: Medicare Other

## 2018-10-09 DIAGNOSIS — Z952 Presence of prosthetic heart valve: Secondary | ICD-10-CM | POA: Diagnosis not present

## 2018-10-10 ENCOUNTER — Encounter: Payer: Self-pay | Admitting: Internal Medicine

## 2018-10-10 ENCOUNTER — Ambulatory Visit (INDEPENDENT_AMBULATORY_CARE_PROVIDER_SITE_OTHER): Payer: Medicare Other | Admitting: Internal Medicine

## 2018-10-10 VITALS — BP 122/60 | HR 72 | Ht 70.0 in | Wt 181.0 lb

## 2018-10-10 DIAGNOSIS — I442 Atrioventricular block, complete: Secondary | ICD-10-CM | POA: Diagnosis not present

## 2018-10-10 DIAGNOSIS — Z952 Presence of prosthetic heart valve: Secondary | ICD-10-CM

## 2018-10-10 DIAGNOSIS — I480 Paroxysmal atrial fibrillation: Secondary | ICD-10-CM

## 2018-10-10 DIAGNOSIS — Z950 Presence of cardiac pacemaker: Secondary | ICD-10-CM

## 2018-10-10 DIAGNOSIS — I359 Nonrheumatic aortic valve disorder, unspecified: Secondary | ICD-10-CM

## 2018-10-10 NOTE — Patient Instructions (Signed)
Medication Instructions:  Your physician recommends that you continue on your current medications as directed. Please refer to the Current Medication list given to you today.  Labwork: None ordered.  Testing/Procedures: None ordered.  Follow-Up: Your physician wants you to follow-up in: one year with Dr. Lovena Le.   You will receive a reminder letter in the mail two months in advance. If you don't receive a letter, please call our office to schedule the follow-up appointment.  Remote monitoring is used to monitor your Pacemaker from home. This monitoring reduces the number of office visits required to check your device to one time per year. It allows [place] to keep an eye on the functioning of your device to ensure it is working properly. You are scheduled for a device check from home on 12/13/2018. You may send your transmission at any time that day. If you have a wireless device, the transmission will be sent automatically. After your physician reviews your transmission, you will receive a postcard with your next transmission date.  Any Other Special Instructions Will Be Listed Below (If Applicable).  If you need a refill on your cardiac medications before your next appointment, please call your pharmacy.

## 2018-10-10 NOTE — Progress Notes (Signed)
HPI Jeffery Rogers returns today for followup of his atrial fib and CHB, s/p PPM, and aortic stenosis, s/p TAVR. He is a very active and vigorous elderly man who developed symptomatic heart block and underwent PPM insertion a couple of years ago. No syncope, sob or chest pain. He exercises regularly. No limit. He feels well. He has started back on his stationary bike.  No Known Allergies   Current Outpatient Medications  Medication Sig Dispense Refill  . amLODipine (NORVASC) 5 MG tablet Take 1 tablet (5 mg total) by mouth daily. 90 tablet 3  . amoxicillin (AMOXIL) 500 MG tablet Take 4 tablets (2,000 mg) one hour prior to dental visits. 8 tablet 6  . apixaban (ELIQUIS) 5 MG TABS tablet Take 1 tablet (5 mg total) by mouth 2 (two) times daily. 180 tablet 3  . aspirin EC 81 MG tablet Take 81 mg by mouth daily.    Marland Kitchen atorvastatin (LIPITOR) 40 MG tablet Take 1 tablet (40 mg total) by mouth daily. 90 tablet 2  . Cholecalciferol (VITAMIN D-3) 1000 units CAPS Take 1,000 Units by mouth daily.     Marland Kitchen donepezil (ARICEPT) 10 MG tablet Take 1 tablet (10 mg total) by mouth at bedtime. 90 tablet 4  . ipratropium (ATROVENT) 0.06 % nasal spray Place 1-2 sprays into both nostrils 4 (four) times daily as needed for rhinitis.    Marland Kitchen L-Methylfolate-Algae-B12-B6 (METANX) 3-90.314-2-35 MG CAPS Take 1 capsule by mouth 2 (two) times daily. 180 capsule 4  . memantine (NAMENDA) 10 MG tablet Take 1 tablet (10 mg total) by mouth 2 (two) times daily. 180 tablet 4  . QUEtiapine (SEROQUEL) 25 MG tablet Take 1 tablet (25 mg total) by mouth at bedtime. 90 tablet 3  . sertraline (ZOLOFT) 50 MG tablet Take 50 mg by mouth daily.     . vitamin C (ASCORBIC ACID) 500 MG tablet Take 500 mg by mouth daily.     No current facility-administered medications for this visit.      Past Medical History:  Diagnosis Date  . Arthritis    "right arm/shoulder" (02/10/2016)  . Complete heart block ([place]) 02/10/2016   a. s/p Mudlogger  . Goiter   . Hypercholesteremia   . Memory loss   . Nasal septal deviation   . Persistent atrial fibrillation   . Presence of permanent cardiac pacemaker   . 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 Severe aortic stenosis   . Tremor     ROS:   All systems reviewed and negative except as noted in the HPI.   Past Surgical History:  Procedure Laterality Date  . CATARACT EXTRACTION W/ INTRAOCULAR LENS  IMPLANT, BILATERAL Bilateral ~ 2000  . EP IMPLANTABLE DEVICE N/A 02/11/2016   Procedure: Pacemaker Implant;  Surgeon: Dolliver Lance, MD;  Location: [facility];  Service: Cardiovascular;  Laterality: N/A;  . RIGHT/LEFT HEART CATH AND CORONARY ANGIOGRAPHY N/A 05/03/2018   Procedure: RIGHT/LEFT HEART CATH AND CORONARY ANGIOGRAPHY;  Surgeon: Burnell Blanks, MD;  Location: [facility];  Service: Cardiovascular;  Laterality: N/A;  . TEE WITHOUT CARDIOVERSION N/A 05/30/2018   Procedure: TRANSESOPHAGEAL ECHOCARDIOGRAM (TEE);  Surgeon: Burnell Blanks, MD;  Location: [facility];  Service: Open Heart Surgery;  Laterality: N/A;  . TONSILLECTOMY  1930s  . TRANSCATHETER AORTIC VALVE REPLACEMENT, TRANSFEMORAL N/A 05/30/2018  Procedure: TRANSCATHETER AORTIC VALVE REPLACEMENT, TRANSFEMORAL using a 71mm Edwards Sapien 3 Aortic Valve;  Surgeon: Burnell Blanks, MD;  Location: [facility];  Service: Open Heart Surgery;  Laterality: N/A;  . WISDOM TOOTH EXTRACTION  1990s     Family History  Problem Relation Age of Onset  . Stroke Other   . Irregular heart beat Other   . Stroke Mother   . Stroke Father   . CAD Father        MI     Social History   Socioeconomic History  . Marital status: Married    Spouse name: Not on file  . Number of children: 2  . Years of education: Bachelors  . Highest education level: Not on file    Occupational History  . Occupation: Retired Glass blower/designer  . Financial resource strain: Not on file  . Food insecurity:    Worry: Not on file    Inability: Not on file  . Transportation needs:    Medical: Not on file    Non-medical: Not on file  Tobacco Use  . Smoking status: Former Smoker    Packs/day: 1.00    Years: 16.00    Pack years: 16.00    Types: Cigarettes  . Smokeless tobacco: Never Used  . Tobacco comment: Quit smoking cigarettes in 1971  Substance and Sexual Activity  . Alcohol use: Never    Alcohol/week: 2.0 standard drinks    Types: 1 Cans of beer, 1 Shots of liquor per week    Frequency: Never  . Drug use: No  . Sexual activity: Yes  Lifestyle  . Physical activity:    Days per week: Not on file    Minutes per session: Not on file  . Stress: Not on file  Relationships  . Social connections:    Talks on phone: Not on file    Gets together: Not on file    Attends religious service: Not on file    Active member of club or organization: Not on file    Attends meetings of clubs or organizations: Not on file    Relationship status: Not on file  . Intimate partner violence:    Fear of current or ex partner: Not on file    Emotionally abused: Not on file    Physically abused: Not on file    Forced sexual activity: Not on file  Other Topics Concern  . Not on file  Social History Narrative   Lives at home with his wife.   Right-handed.   1 cup coffee per day.  Occasional Coke.     BP 122/60   Pulse 72   Ht 5\' 10"  (1.778 m)   Wt 181 lb (82.1 kg)   BMI 25.97 kg/m   Physical Exam:  Well appearing NAD HEENT: Unremarkable Neck:  No JVD, no thyromegally Lymphatics:  No adenopathy Back:  No CVA tenderness Lungs:  Clear with wheezes HEART:  Regular rate rhythm, no murmurs, no rubs, no clicks Abd:  soft, positive bowel sounds, no organomegally, no rebound, no guarding Ext:  2 plus pulses, no edema, no cyanosis, no clubbing Skin:  No rashes  no nodules Neuro:  CN II through XII intact, motor grossly intact   DEVICE  Normal device function.  See PaceArt for details.   Assess/Plan: 1. CHB - he is asymptomatic, s/p PPM insertion. 2. PPM - his [place] [employer] DDD PM is programmed VVIR at 62 3. Aortic stenosis - he is s/p  TAVR. He is asymptomatic.   Jeffery Rogers.D.

## 2018-10-11 ENCOUNTER — Encounter (HOSPITAL_COMMUNITY)
Admission: RE | Admit: 2018-10-11 | Discharge: 2018-10-11 | Disposition: A | Discharge status: Home / Self Care | Payer: Medicare Other | Source: Ambulatory Visit | Attending: Cardiovascular Disease | Admitting: Cardiovascular Disease

## 2018-10-11 ENCOUNTER — Encounter (HOSPITAL_COMMUNITY): Payer: Medicare Other

## 2018-10-11 ENCOUNTER — Encounter (HOSPITAL_COMMUNITY): Payer: Self-pay

## 2018-10-11 DIAGNOSIS — Z952 Presence of prosthetic heart valve: Secondary | ICD-10-CM

## 2018-10-12 NOTE — Progress Notes (Signed)
Cardiac Individual Treatment Plan  Patient Details  Name: Jeffery Rogers MRN: [id number] Date of Birth: 1932/06/11 Referring Provider:     CARDIAC REHAB PHASE II ORIENTATION from 08/08/2018 in [place]  Referring Provider  Val Riles MD      Initial Encounter Date:    CARDIAC REHAB PHASE II ORIENTATION from 08/08/2018 in [place]  Date  08/08/18      Visit Diagnosis: 05/30/2018 S/P TAVR (transcatheter aortic valve replacement)  Patient's Home Medications on Admission:  Current Outpatient Medications:  .  amLODipine (NORVASC) 5 MG tablet, Take 1 tablet (5 mg total) by mouth daily., Disp: 90 tablet, Rfl: 3 .  amoxicillin (AMOXIL) 500 MG tablet, Take 4 tablets (2,000 mg) one hour prior to dental visits., Disp: 8 tablet, Rfl: 6 .  apixaban (ELIQUIS) 5 MG TABS tablet, Take 1 tablet (5 mg total) by mouth 2 (two) times daily., Disp: 180 tablet, Rfl: 3 .  aspirin EC 81 MG tablet, Take 81 mg by mouth daily., Disp: , Rfl:  .  atorvastatin (LIPITOR) 40 MG tablet, Take 1 tablet (40 mg total) by mouth daily., Disp: 90 tablet, Rfl: 2 .  Cholecalciferol (VITAMIN D-3) 1000 units CAPS, Take 1,000 Units by mouth daily. , Disp: , Rfl:  .  donepezil (ARICEPT) 10 MG tablet, Take 1 tablet (10 mg total) by mouth at bedtime., Disp: 90 tablet, Rfl: 4 .  ipratropium (ATROVENT) 0.06 % nasal spray, Place 1-2 sprays into both nostrils 4 (four) times daily as needed for rhinitis., Disp: , Rfl:  .  L-Methylfolate-Algae-B12-B6 (METANX) 3-90.314-2-35 MG CAPS, Take 1 capsule by mouth 2 (two) times daily., Disp: 180 capsule, Rfl: 4 .  memantine (NAMENDA) 10 MG tablet, Take 1 tablet (10 mg total) by mouth 2 (two) times daily., Disp: 180 tablet, Rfl: 4 .  QUEtiapine (SEROQUEL) 25 MG tablet, Take 1 tablet (25 mg total) by mouth at bedtime., Disp: 90 tablet, Rfl: 3 .  sertraline (ZOLOFT) 50 MG tablet, Take 50 mg by mouth daily. , Disp: , Rfl:  .   vitamin C (ASCORBIC ACID) 500 MG tablet, Take 500 mg by mouth daily., Disp: , Rfl:   Past Medical History: Past Medical History:  Diagnosis Date  . Arthritis    "right arm/shoulder" (02/10/2016)  . Complete heart block ([place]) 02/10/2016   a. s/p Emergency planning/management officer  . Goiter   . Hypercholesteremia   . Memory loss   . Nasal septal deviation   . Persistent atrial fibrillation   . Presence of permanent cardiac pacemaker   . 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 Severe aortic stenosis   . Tremor     Tobacco Use: Social History   Tobacco Use  Smoking Status Former Smoker  . Packs/day: 1.00  . Years: 16.00  . Pack years: 16.00  . Types: Cigarettes  Smokeless Tobacco Never Used  Tobacco Comment   Quit smoking cigarettes in 1971    Labs: Recent Review Flowsheet Data    Labs for ITP Cardiac and Pulmonary Rehab Latest Ref Rng & Units 05/22/2018 05/30/2018 05/30/2018 05/30/2018 05/30/2018   Hemoglobin A1c 4.8 - 5.6 % 5.5 - - - -   PHART 7.350 - 7.450 7.445 - - - -   PCO2ART 32.0 - 48.0 mmHg 34.3 - - - -   HCO3 20.0 - 28.0 mmol/L 23.2 - - - -  TCO2 22 - 32 mmol/L - 24 24 25 25    ACIDBASEDEF 0.0 - 2.0 mmol/L 0.4 - - - -   O2SAT % 97.9 - - - -      Capillary Blood Glucose: No results found for: GLUCAP   Exercise Target Goals: Exercise Program Goal: Individual exercise prescription set using results from initial 6 min walk test and THRR while considering  patient's activity barriers and safety.   Exercise Prescription Goal: Initial exercise prescription builds to 30-45 minutes a day of aerobic activity, 2-3 days per week.  Home exercise guidelines will be given to patient during program as part of exercise prescription that the participant will acknowledge.  Activity Barriers & Risk Stratification: Activity Barriers & Cardiac Risk Stratification -  08/08/18 1147      Activity Barriers & Cardiac Risk Stratification   Activity Barriers  Balance Concerns;Deconditioning    Cardiac Risk Stratification  High       6 Minute Walk: 6 Minute Walk    Row Name 08/08/18 1144         6 Minute Walk   Phase  Initial     Distance  855 feet     Walk Time  6 minutes     # of Rest Breaks  0     MPH  1.62     METS  0.9     RPE  9     Perceived Dyspnea   0     VO2 Peak  3.16     Symptoms  No     Resting HR  60 bpm     Resting BP  120/80     Resting Oxygen Saturation   98 %     Exercise Oxygen Saturation  during 6 min walk  99 %     Max Ex. HR  77 bpm     Max Ex. BP  104/60     2 Minute Post BP  118/56        Oxygen Initial Assessment:   Oxygen Re-Evaluation:   Oxygen Discharge (Final Oxygen Re-Evaluation):   Initial Exercise Prescription: Initial Exercise Prescription - 08/08/18 1100      Date of Initial Exercise RX and Referring Provider   Date  08/08/18    Referring Provider  Val Riles MD    Expected Discharge Date  11/13/18      T5 Nustep   Level  1    SPM  75    Minutes  20    METs  1      Track   Laps  4    Minutes  10    METs  0.9      Prescription Details   Frequency (times per week)  3x    Duration  Progress to 30 minutes of continuous aerobic without signs/symptoms of physical distress      Intensity   THRR 40-80% of Max Heartrate  54-108    Ratings of Perceived Exertion  11-13    Perceived Dyspnea  0-4      Progression   Progression  Continue progressive overload as per policy without signs/symptoms or physical distress.      Resistance Training   Training Prescription  Yes    Weight  2lbs    Reps  10-15       Perform Capillary Blood Glucose checks as needed.  Exercise Prescription Changes: Exercise Prescription Changes    Row Name 08/14/18 1600 08/23/18 1333 08/28/18 1031 09/04/18 1346 09/11/18  1600     Response to Exercise   Blood Pressure (Admit)  108/70  117/72  122/78   122/70  104/64   Blood Pressure (Exercise)  118/68  118/58  114/68  118/64  120/66   Blood Pressure (Exit)  110/64  112/60  97/60  108/70  130/70   Heart Rate (Admit)  70 bpm  56 bpm  72 bpm  62 bpm  71 bpm   Heart Rate (Exercise)  110 bpm  104 bpm  92 bpm  102 bpm  109 bpm   Heart Rate (Exit)  60 bpm  60 bpm  70 bpm  60 bpm  58 bpm   Rating of Perceived Exertion (Exercise)  11  11  11  11  11    Perceived Dyspnea (Exercise)  0  0  0  0  0   Symptoms  None  None  None  None  None   Comments  Pt oriented to exercise equipment  None  None  None  None   Duration  Progress to 30 minutes of  aerobic without signs/symptoms of physical distress  Progress to 30 minutes of  aerobic without signs/symptoms of physical distress  Progress to 30 minutes of  aerobic without signs/symptoms of physical distress  Progress to 30 minutes of  aerobic without signs/symptoms of physical distress  Continue with 30 min of aerobic exercise without signs/symptoms of physical distress.   Intensity  THRR unchanged  THRR unchanged  THRR unchanged  THRR unchanged  THRR unchanged     Progression   Progression  Continue to progress workloads to maintain intensity without signs/symptoms of physical distress.  Continue to progress workloads to maintain intensity without signs/symptoms of physical distress.  Continue to progress workloads to maintain intensity without signs/symptoms of physical distress.  Continue to progress workloads to maintain intensity without signs/symptoms of physical distress.  Continue to progress workloads to maintain intensity without signs/symptoms of physical distress.   Average METs  2.54  2.4  2.5  2.9  2.77     Resistance Training   Training Prescription  Yes  No  Yes  No  No   Weight  2lbs  -  2lbs  -  -   Reps  10-15  -  10-15  -  -   Time  10 Minutes  -  10 Minutes  -  -     Interval Training   Interval Training  No  No  No  No  No     T5 Nustep   Level  1  3  4  4  4    SPM  85  95  95   105  105   Minutes  20  20  20  20  20    METs  2.5  3  2.8  2.9  2.8     Track   Laps  9  12  9  11  12    Minutes  10  10  10  10  10    METs  2.53  3.07  2.53  2.91  2.74     Home Exercise Plan   Plans to continue exercise at  -  -  -  -  Home (comment) Walking   Frequency  -  -  -  -  Add 2 additional days to program exercise sessions.   Initial Home Exercises Provided  -  -  -  -  09/08/18   Row Name 09/18/18  1032 09/29/18 1032 10/09/18 1423         Response to Exercise   Blood Pressure (Admit)  118/70  124/70  112/76     Blood Pressure (Exercise)  118/78  148/68  122/60     Blood Pressure (Exit)  108/62  98/60  110/70     Heart Rate (Admit)  85 bpm  65 bpm  66 bpm     Heart Rate (Exercise)  106 bpm  102 bpm  113 bpm     Heart Rate (Exit)  60 bpm  63 bpm  60 bpm     Rating of Perceived Exertion (Exercise)  12  12  11      Perceived Dyspnea (Exercise)  0  0  0     Symptoms  None  None  None     Comments  None  None  None     Duration  Continue with 30 min of aerobic exercise without signs/symptoms of physical distress.  Continue with 30 min of aerobic exercise without signs/symptoms of physical distress.  Continue with 30 min of aerobic exercise without signs/symptoms of physical distress.     Intensity  THRR unchanged  THRR unchanged  THRR unchanged       Progression   Progression  Continue to progress workloads to maintain intensity without signs/symptoms of physical distress.  Continue to progress workloads to maintain intensity without signs/symptoms of physical distress.  Continue to progress workloads to maintain intensity without signs/symptoms of physical distress.     Average METs  2.87  2.87  3       Resistance Training   Training Prescription  Yes  No  No     Weight  2lbs  -  -     Reps  10-15  -  -     Time  10 Minutes  -  -       Interval Training   Interval Training  No  No  No       T5 Nustep   Level  4  4  5      SPM  105  105  110     Minutes  20  20  20       METs  3  3  3.2       Track   Laps  10  11  11      Minutes  10  10  10      METs  2.76  2.91  2.91       Home Exercise Plan   Plans to continue exercise at  Home (comment) Walking  Home (comment) Walking  Home (comment) Walking     Frequency  Add 2 additional days to program exercise sessions.  Add 2 additional days to program exercise sessions.  Add 2 additional days to program exercise sessions.     Initial Home Exercises Provided  09/08/18  09/08/18  09/08/18        Exercise Comments: Exercise Comments    Row Name 08/14/18 1624 09/08/18 1614 10/12/18 1424       Exercise Comments  Pt's first day of exercise. Pt oriented to exercise equipment. Pt responded well to workloads, will continue to monitor and progress pt as tolerated.   Reviewed HEP with pt's wife. Pt is not exericising at home and give family attitude and pushback when they try and get him to go to the [facility]. Pt has dementia, but enjoys coming to Cardiac Rehab.   Pt  is continuing to respond well to exercise prescription. Pt's family he is enjoying coming to rehab. Will continue to monitor pt.         Exercise Goals and Review: Exercise Goals    Row Name 08/08/18 1146             Exercise Goals   Increase Physical Activity  Yes       Intervention  Provide advice, education, support and counseling about physical activity/exercise needs.;Develop an individualized exercise prescription for aerobic and resistive training based on initial evaluation findings, risk stratification, comorbidities and participant's personal goals.       Expected Outcomes  Short Term: Attend rehab on a regular basis to increase amount of physical activity.;Long Term: Add in home exercise to make exercise part of routine and to increase amount of physical activity.;Long Term: Exercising regularly at least 3-5 days a week.       Increase Strength and Stamina  Yes       Intervention  Provide advice, education, support and counseling about  physical activity/exercise needs.;Develop an individualized exercise prescription for aerobic and resistive training based on initial evaluation findings, risk stratification, comorbidities and participant's personal goals.       Expected Outcomes  Short Term: Perform resistance training exercises routinely during rehab and add in resistance training at home;Short Term: Increase workloads from initial exercise prescription for resistance, speed, and METs.;Long Term: Improve cardiorespiratory fitness, muscular endurance and strength as measured by increased METs and functional capacity (6MWT)       Able to understand and use rate of perceived exertion (RPE) scale  Yes       Intervention  Provide education and explanation on how to use RPE scale       Expected Outcomes  Short Term: Able to use RPE daily in rehab to express subjective intensity level;Long Term:  Able to use RPE to guide intensity level when exercising independently       Knowledge and understanding of Target Heart Rate Range (THRR)  Yes       Intervention  Provide education and explanation of THRR including how the numbers were predicted and where they are located for reference       Expected Outcomes  Short Term: Able to state/look up THRR;Long Term: Able to use THRR to govern intensity when exercising independently;Short Term: Able to use daily as guideline for intensity in rehab       Able to check pulse independently  Yes       Intervention  Provide education and demonstration on how to check pulse in carotid and radial arteries.;Review the importance of being able to check your own pulse for safety during independent exercise       Expected Outcomes  Short Term: Able to explain why pulse checking is important during independent exercise;Long Term: Able to check pulse independently and accurately       Understanding of Exercise Prescription  Yes       Intervention  Provide education, explanation, and written materials on patient's  individual exercise prescription       Expected Outcomes  Short Term: Able to explain program exercise prescription;Long Term: Able to explain home exercise prescription to exercise independently          Exercise Goals Re-Evaluation : Exercise Goals Re-Evaluation    Row Name 09/08/18 1615 10/12/18 1432           Exercise Goal Re-Evaluation   Exercise Goals Review  Increase Physical Activity;Able to  understand and use rate of perceived exertion (RPE) scale;Knowledge and understanding of Target Heart Rate Range (THRR);Understanding of Exercise Prescription;Increase Strength and Stamina;Able to check pulse independently  Increase Physical Activity;Understanding of Exercise Prescription      Comments  Reviewed HEP with pt's wife, due to pt having dementia. Spoke with wife about the importance of exercising most days of the week. Spoke with pt's wife about pt using [employer] and possibly using that at [employer].   Pt puts forth great effort with exercise. Pt is continuing to increase cardiovascular strength. Will continue to monitor and progress pt.       Expected Outcomes  Family will try and continue to work with pt to try and exercise at home. Family has a hard time getting pt to exercise at home due to resistance from pt. Will continue to help family with pt exercising on his own.   Family is still having trouble getting pt to move at home. Pt will only exercise when he comes to rehab. will continue to try and brainstorm with family on how to help get pt moving more at home.          Discharge Exercise Prescription (Final Exercise Prescription Changes): Exercise Prescription Changes - 10/09/18 1423      Response to Exercise   Blood Pressure (Admit)  112/76    Blood Pressure (Exercise)  122/60    Blood Pressure (Exit)  110/70    Heart Rate (Admit)  66 bpm    Heart Rate (Exercise)  113 bpm    Heart Rate (Exit)  60 bpm    Rating of Perceived Exertion (Exercise)  11    Perceived Dyspnea  (Exercise)  0    Symptoms  None    Comments  None    Duration  Continue with 30 min of aerobic exercise without signs/symptoms of physical distress.    Intensity  THRR unchanged      Progression   Progression  Continue to progress workloads to maintain intensity without signs/symptoms of physical distress.    Average METs  3      Resistance Training   Training Prescription  No      Interval Training   Interval Training  No      T5 Nustep   Level  5    SPM  110    Minutes  20    METs  3.2      Track   Laps  11    Minutes  10    METs  2.91      Home Exercise Plan   Plans to continue exercise at  Home (comment)   Walking   Frequency  Add 2 additional days to program exercise sessions.    Initial Home Exercises Provided  09/08/18       Nutrition:  Target Goals: Understanding of nutrition guidelines, daily intake of sodium 1500mg , cholesterol 200mg , calories 30% from fat and 7% or less from saturated fats, daily to have 5 or more servings of fruits and vegetables.  Biometrics: Pre Biometrics - 08/08/18 1146      Pre Biometrics   Height  5\' 10"  (1.778 m)    Weight  80.9 kg    Waist Circumference  39 inches    Hip Circumference  41 inches    Waist to Hip Ratio  0.95 %    BMI (Calculated)  25.59    Triceps Skinfold  35 mm    % Body Fat  30.2 %  Grip Strength  38 kg    Flexibility  0 in    Single Leg Stand  0 seconds        Nutrition Therapy Plan and Nutrition Goals: Nutrition Therapy & Goals - 08/08/18 1042      Nutrition Therapy   Diet  general healthful      Personal Nutrition Goals   Nutrition Goal  Pt to identify and limit food sources of saturated fat, trans fat, refined carbohydrates and sodium    Personal Goal #2  Pt to eat complex carbs at breakfast    Personal Goal #3  Pt able to name foods that affect blood glucose      Intervention Plan   Intervention  Prescribe, educate and counsel regarding individualized specific dietary modifications  aiming towards targeted core components such as weight, hypertension, lipid management, diabetes, heart failure and other comorbidities.    Expected Outcomes  Short Term Goal: Understand basic principles of dietary content, such as calories, fat, sodium, cholesterol and nutrients.;Long Term Goal: Adherence to prescribed nutrition plan.       Nutrition Assessments: Nutrition Assessments - 08/08/18 1041      MEDFICTS Scores   Pre Score  18       Nutrition Goals Re-Evaluation: Nutrition Goals Re-Evaluation    Row Name 08/08/18 8341             Personal Goal #3 Re-Evaluation   Personal Goal #3  Pt able to name foods that affect blood glucose          Nutrition Goals Re-Evaluation: Nutrition Goals Re-Evaluation    [place] Name 08/08/18 9622             Personal Goal #3 Re-Evaluation   Personal Goal #3  Pt able to name foods that affect blood glucose          Nutrition Goals Discharge (Final Nutrition Goals Re-Evaluation): Nutrition Goals Re-Evaluation - 08/08/18 0937      Personal Goal #3 Re-Evaluation   Personal Goal #3  Pt able to name foods that affect blood glucose       Psychosocial: Target Goals: Acknowledge presence or absence of significant depression and/or stress, maximize coping skills, provide positive support system. Participant is able to verbalize types and ability to use techniques and skills needed for reducing stress and depression.  Initial Review & Psychosocial Screening: Initial Psych Review & Screening - 08/08/18 0917      Initial Review   Current issues with  Current Sleep Concerns;Current Anxiety/Panic   difficult to assess with dementia diagnosis     Family Dynamics   Good Support System?  Yes   spouse, children      Barriers   Psychosocial barriers to participate in program  Psychosocial barriers identified (see note)   dementia      Screening Interventions   Interventions  Encouraged to exercise       Quality of Life  Scores: Quality of Life - 08/08/18 0919      Quality of Life   Select  --   unable to assess due to dementia      Scores of 19 and below usually indicate a poorer quality of life in these areas.  A difference of  2-3 points is a clinically meaningful difference.  A difference of 2-3 points in the total score of the Quality of Life Index has been associated with significant improvement in overall quality of life, self-image, physical symptoms, and general health in studies assessing change  in quality of life.  PHQ-9: Recent Review Flowsheet Data    There is no flowsheet data to display.     Interpretation of Total Score  Total Score Depression Severity:  1-4 = Minimal depression, 5-9 = Mild depression, 10-14 = Moderate depression, 15-19 = Moderately severe depression, 20-27 = Severe depression   Psychosocial Evaluation and Intervention: Psychosocial Evaluation - 08/14/18 1215      Psychosocial Evaluation & Interventions   Interventions  Encouraged to exercise with the program and follow exercise prescription;Relaxation education    Comments  Sleep concerns were reported at orientation.  This is difficult to assess with patient's dementia.  Pt enjoys participating in a social group that meets regularly on Tuesdays and Thursdays.     Expected Outcomes  Jeffery Rogers will report lessened cocners with sleep.     Continue Psychosocial Services   Follow up required by staff       Psychosocial Re-Evaluation: Psychosocial Re-Evaluation    [place] Name 08/24/18 0745 09/14/18 1224 10/11/18 1701         Psychosocial Re-Evaluation   Current issues with  Current Sleep Concerns  None Identified  None Identified     Comments  No interventions necessary.  No interventions necessary.  No interventions necessary.     Expected Outcomes  Jeffery Rogers will have less concerns of sleep.   Jeffery Rogers will maintain a positive outlook.   Jeffery Rogers will maintain a positive outlook using a good coping skills.      Interventions   Encouraged to attend Cardiac Rehabilitation for the exercise  Encouraged to attend Cardiac Rehabilitation for the exercise  Encouraged to attend Cardiac Rehabilitation for the exercise     Continue Psychosocial Services   Follow up required by staff  No Follow up required  No Follow up required        Psychosocial Discharge (Final Psychosocial Re-Evaluation): Psychosocial Re-Evaluation - 10/11/18 1701      Psychosocial Re-Evaluation   Current issues with  None Identified    Comments  No interventions necessary.    Expected Outcomes  Jeffery Rogers will maintain a positive outlook using a good coping skills.     Interventions  Encouraged to attend Cardiac Rehabilitation for the exercise    Continue Psychosocial Services   No Follow up required       Vocational Rehabilitation: Provide vocational rehab assistance to qualifying candidates.   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 Participant will state understanding/return demonstration of topics presented.  Learning Barriers/Preferences: Learning Barriers/Preferences - 08/08/18 1139      Learning Barriers/Preferences   Learning Barriers  Hearing   Pt has dementia    Learning Preferences  Skilled Demonstration;Individual Instruction       Education Topics: Count Your Pulse:  -Group instruction provided by verbal instruction, demonstration, patient participation and written materials to support subject.  Instructors address importance of being able to find your pulse and how to count your pulse when at home without a heart monitor.  Patients get hands on experience counting their pulse with staff help and individually.   CARDIAC REHAB PHASE II EXERCISE from  10/11/2018 in [place]  Date  09/22/18  Educator  RN  Instruction  Review Code  2- Demonstrated Understanding      Heart Attack, Angina, and Risk Factor Modification:  -Group instruction provided by verbal instruction, video, and written materials to support subject.  Instructors address signs and symptoms of angina and heart attacks.    Also discuss risk factors for heart disease and how to make changes to improve heart health risk factors.   CARDIAC REHAB PHASE II EXERCISE from 10/11/2018 in [place]  Date  09/13/18  Educator  RN  Instruction Review Code  2- Demonstrated Understanding      Functional Fitness:  -Group instruction provided by verbal instruction, demonstration, patient participation, and written materials to support subject.  Instructors address safety measures for doing things around the house.  Discuss how to get up and down off the floor, how to pick things up properly, how to safely get out of a chair without assistance, and balance training.   CARDIAC REHAB PHASE II EXERCISE from 10/11/2018 in [place]  Date  09/15/18  Instruction Review Code  2- Demonstrated Understanding      Meditation and Mindfulness:  -Group instruction provided by verbal instruction, patient participation, and written materials to support subject.  Instructor addresses importance of mindfulness and meditation practice to help reduce stress and improve awareness.  Instructor also leads participants through a meditation exercise.    Stretching for Flexibility and Mobility:  -Group instruction provided by verbal instruction, patient participation, and written materials to support subject.  Instructors lead participants through series of stretches that are designed to increase flexibility thus improving mobility.  These stretches are additional exercise for major muscle groups that are typically performed  during regular warm up and cool down.   Hands Only CPR:  -Group verbal, video, and participation provides a basic overview of AHA guidelines for community CPR. Role-play of emergencies allow participants the opportunity to practice calling for help and chest compression technique with discussion of AED use.   Hypertension: -Group verbal and written instruction that provides a basic overview of hypertension including the most recent diagnostic guidelines, risk factor reduction with self-care instructions and medication management.    Nutrition I class: Heart Healthy Eating:  -Group instruction provided by PowerPoint slides, verbal discussion, and written materials to support subject matter. The instructor gives an explanation and review of the Therapeutic Lifestyle Changes diet recommendations, which includes a discussion on lipid goals, dietary fat, sodium, fiber, plant stanol/sterol esters, sugar, and the components of a well-balanced, healthy diet.   Nutrition II class: Lifestyle Skills:  -Group instruction provided by PowerPoint slides, verbal discussion, and written materials to support subject matter. The instructor gives an explanation and review of label reading, grocery shopping for heart health, heart healthy recipe modifications, and ways to make healthier choices when eating out.   Diabetes Question & Answer:  -Group instruction provided by PowerPoint slides, verbal discussion, and written materials to support subject matter. The instructor gives an explanation and review of diabetes co-morbidities, pre- and post-prandial blood glucose goals, pre-exercise blood glucose goals, signs, symptoms, and treatment of hypoglycemia and hyperglycemia, and foot care basics.   Diabetes Blitz:  -Group instruction provided by PowerPoint slides, verbal discussion, and written materials to support subject matter. The instructor gives an explanation and review of the physiology behind type 1 and  type 2 diabetes, diabetes medications and rational behind using different medications, pre- and post-prandial blood glucose recommendations and Hemoglobin A1c goals, diabetes diet, and exercise including blood glucose  guidelines for exercising safely.    Portion Distortion:  -Group instruction provided by PowerPoint slides, verbal discussion, written materials, and food models to support subject matter. The instructor gives an explanation of serving size versus portion size, changes in portions sizes over the last 20 years, and what consists of a serving from each food group.   Stress Management:  -Group instruction provided by verbal instruction, video, and written materials to support subject matter.  Instructors review role of stress in heart disease and how to cope with stress positively.     Exercising on Your Own:  -Group instruction provided by verbal instruction, power point, and written materials to support subject.  Instructors discuss benefits of exercise, components of exercise, frequency and intensity of exercise, and end points for exercise.  Also discuss use of nitroglycerin and activating EMS.  Review options of places to exercise outside of rehab.  Review guidelines for sex with heart disease.   Cardiac Drugs I:  -Group instruction provided by verbal instruction and written materials to support subject.  Instructor reviews cardiac drug classes: antiplatelets, anticoagulants, beta blockers, and statins.  Instructor discusses reasons, side effects, and lifestyle considerations for each drug class.   CARDIAC REHAB PHASE II EXERCISE from 10/11/2018 in [place]  Date  10/11/18  Educator  Pharmacist  Instruction Review Code  2- Demonstrated Understanding      Cardiac Drugs II:  -Group instruction provided by verbal instruction and written materials to support subject.  Instructor reviews cardiac drug classes: angiotensin converting enzyme inhibitors  (ACE-I), angiotensin II receptor blockers (ARBs), nitrates, and calcium channel blockers.  Instructor discusses reasons, side effects, and lifestyle considerations for each drug class.   CARDIAC REHAB PHASE II EXERCISE from 10/11/2018 in [place]  Date  09/06/18  Educator  PharmD  Instruction Review Code  2- Demonstrated Understanding      Anatomy and Physiology of the Circulatory System:  Group verbal and written instruction and models provide basic cardiac anatomy and physiology, with the coronary electrical and arterial systems. Review of: AMI, Angina, Valve disease, Heart Failure, Peripheral Artery Disease, Cardiac Arrhythmia, Pacemakers, and the ICD.   Other Education:  -Group or individual verbal, written, or video instructions that support the educational goals of the cardiac rehab program.   Holiday Eating Survival Tips:  -Group instruction provided by PowerPoint slides, verbal discussion, and written materials to support subject matter. The instructor gives patients tips, tricks, and techniques to help them not only survive but enjoy the holidays despite the onslaught of food that accompanies the holidays.   Knowledge Questionnaire Score: Knowledge Questionnaire Score - 08/08/18 0919      Knowledge Questionnaire Score   Pre Score  20/24, quiz completed by pt wife        Core Components/Risk Factors/Patient Goals at Admission: Personal Goals and Risk Factors at Admission - 08/08/18 1141      Core Components/Risk Factors/Patient Goals on Admission    Weight Management  Yes;Weight Loss    Intervention  Weight Management: Develop a combined nutrition and exercise program designed to reach desired caloric intake, while maintaining appropriate intake of nutrient and fiber, sodium and fats, and appropriate energy expenditure required for the weight goal.;Weight Management: Provide education and appropriate resources to help participant work on and  attain dietary goals.;Weight Management/Obesity: Establish reasonable short term and long term weight goals.    Admit Weight  178 lb 5.6 oz (80.9 kg)    Goal  Weight: Long Term  170 lb (77.1 kg)    Expected Outcomes  Short Term: Continue to assess and modify interventions until short term weight is achieved;Long Term: Adherence to nutrition and physical activity/exercise program aimed toward attainment of established weight goal;Weight Loss: Understanding of general recommendations for a balanced deficit meal plan, which promotes 1-2 lb weight loss per week and includes a negative energy balance of [phone] kcal/d;Understanding recommendations for meals to include 15-35% energy as protein, 25-35% energy from fat, 35-60% energy from carbohydrates, less than 200mg  of dietary cholesterol, 20-35 gm of total fiber daily;Understanding of distribution of calorie intake throughout the day with the consumption of 4-5 meals/snacks    Hypertension  Yes    Intervention  Provide education on lifestyle modifcations including regular physical activity/exercise, weight management, moderate sodium restriction and increased consumption of fresh fruit, vegetables, and low fat dairy, alcohol moderation, and smoking cessation.;Monitor prescription use compliance.    Expected Outcomes  Short Term: Continued assessment and intervention until BP is < 140/35mm HG in hypertensive participants. < 130/42mm HG in hypertensive participants with diabetes, heart failure or chronic kidney disease.;Long Term: Maintenance of blood pressure at goal levels.    Lipids  Yes    Intervention  Provide education and support for participant on nutrition & aerobic/resistive exercise along with prescribed medications to achieve LDL 70mg , HDL >40mg .    Expected Outcomes  Short Term: Participant states understanding of desired cholesterol values and is compliant with medications prescribed. Participant is following exercise prescription and nutrition  guidelines.;Long Term: Cholesterol controlled with medications as prescribed, with individualized exercise RX and with personalized nutrition plan. Value goals: LDL < 70mg , HDL > 40 mg.       Core Components/Risk Factors/Patient Goals Review:  Goals and Risk Factor Review    Row Name 08/14/18 1217 09/14/18 1225 10/11/18 1701         Core Components/Risk Factors/Patient Goals Review   Personal Goals Review  Hypertension;Lipids;Weight Management/Obesity  Hypertension;Lipids;Weight Management/Obesity  Hypertension;Lipids;Weight Management/Obesity     Review  Pt with multiple CAD RFs willing to participate in CR exercise. Pt would like to increase his walking distance and comfort with walking.  Pt with multiple CAD RFs willing to participate in CR exercise. Jeffery Rogers has been tolerating exercise with increasing workloads.   Pt with multiple CAD RFs willing to participate in CR exercise. Jeffery Rogers has been tolerating exercise well with increasing workloads.      Expected Outcomes  Pt will continue to participate in CR exercise, nutrition, and lifestyle modification opportunities.   Pt will continue to participate in CR exercise, nutrition, and lifestyle modification opportunities.   Pt will continue to participate in CR exercise, nutrition, and lifestyle modification opportunities.         Core Components/Risk Factors/Patient Goals at Discharge (Final Review):  Goals and Risk Factor Review - 10/11/18 1701      Core Components/Risk Factors/Patient Goals Review   Personal Goals Review  Hypertension;Lipids;Weight Management/Obesity    Review  Pt with multiple CAD RFs willing to participate in CR exercise. Jeffery Rogers has been tolerating exercise well with increasing workloads.     Expected Outcomes  Pt will continue to participate in CR exercise, nutrition, and lifestyle modification opportunities.        ITP Comments: ITP Comments    Row Name 08/08/18 7829 08/14/18 1523 09/14/18 1223 10/11/18 1700     ITP  Comments  Dr.Traci Radford Pax, Medical Director   30 Day ITP Review.  Pt started exercise and  tolerated exercise well.  30 Day ITP Review.  Jeffery Rogers is tolerating exercise well with increased loads.  VSS.   30 Day ITP Review.  Jeffery Rogers continues to tolerate increased workloads.  His family does not report anymore sleep concerns.        Comments: See ITP Comments.

## 2018-10-13 ENCOUNTER — Encounter (HOSPITAL_COMMUNITY)
Admission: RE | Admit: 2018-10-13 | Discharge: 2018-10-13 | Disposition: A | Discharge status: Home / Self Care | Payer: Medicare Other | Source: Ambulatory Visit | Attending: Cardiovascular Disease | Admitting: Cardiovascular Disease

## 2018-10-13 ENCOUNTER — Encounter (HOSPITAL_COMMUNITY): Payer: Medicare Other

## 2018-10-13 DIAGNOSIS — Z952 Presence of prosthetic heart valve: Secondary | ICD-10-CM

## 2018-10-16 ENCOUNTER — Encounter (HOSPITAL_COMMUNITY)
Admission: RE | Admit: 2018-10-16 | Discharge: 2018-10-16 | Disposition: A | Discharge status: Home / Self Care | Payer: Medicare Other | Source: Ambulatory Visit | Attending: Cardiovascular Disease | Admitting: Cardiovascular Disease

## 2018-10-16 ENCOUNTER — Encounter (HOSPITAL_COMMUNITY): Payer: Medicare Other

## 2018-10-16 DIAGNOSIS — Z952 Presence of prosthetic heart valve: Secondary | ICD-10-CM

## 2018-10-18 ENCOUNTER — Encounter (HOSPITAL_COMMUNITY)
Admission: RE | Admit: 2018-10-18 | Discharge: 2018-10-18 | Disposition: A | Discharge status: Home / Self Care | Payer: Medicare Other | Source: Ambulatory Visit | Attending: Cardiovascular Disease | Admitting: Cardiovascular Disease

## 2018-10-18 ENCOUNTER — Encounter (HOSPITAL_COMMUNITY): Payer: Medicare Other

## 2018-10-18 DIAGNOSIS — Z952 Presence of prosthetic heart valve: Secondary | ICD-10-CM | POA: Diagnosis not present

## 2018-10-20 ENCOUNTER — Encounter (HOSPITAL_COMMUNITY): Payer: Medicare Other

## 2018-10-20 ENCOUNTER — Encounter (HOSPITAL_COMMUNITY)
Admission: RE | Admit: 2018-10-20 | Discharge: 2018-10-20 | Disposition: A | Discharge status: Home / Self Care | Payer: Medicare Other | Source: Ambulatory Visit | Attending: Cardiovascular Disease | Admitting: Cardiovascular Disease

## 2018-10-20 DIAGNOSIS — Z952 Presence of prosthetic heart valve: Secondary | ICD-10-CM | POA: Diagnosis not present

## 2018-10-23 ENCOUNTER — Encounter (HOSPITAL_COMMUNITY): Payer: Medicare Other

## 2018-10-23 ENCOUNTER — Encounter (HOSPITAL_COMMUNITY)
Admission: RE | Admit: 2018-10-23 | Discharge: 2018-10-23 | Disposition: A | Discharge status: Home / Self Care | Payer: Medicare Other | Source: Ambulatory Visit | Attending: Cardiovascular Disease | Admitting: Cardiovascular Disease

## 2018-10-23 DIAGNOSIS — Z952 Presence of prosthetic heart valve: Secondary | ICD-10-CM

## 2018-10-25 ENCOUNTER — Encounter (HOSPITAL_COMMUNITY): Payer: Medicare Other

## 2018-10-25 ENCOUNTER — Encounter (HOSPITAL_COMMUNITY)
Admission: RE | Admit: 2018-10-25 | Discharge: 2018-10-25 | Disposition: A | Discharge status: Home / Self Care | Payer: Medicare Other | Source: Ambulatory Visit | Attending: Cardiovascular Disease | Admitting: Cardiovascular Disease

## 2018-10-25 DIAGNOSIS — Z952 Presence of prosthetic heart valve: Secondary | ICD-10-CM

## 2018-10-27 ENCOUNTER — Encounter (HOSPITAL_COMMUNITY)
Admission: RE | Admit: 2018-10-27 | Discharge: 2018-10-27 | Disposition: A | Discharge status: Home / Self Care | Payer: Medicare Other | Source: Ambulatory Visit | Attending: Cardiovascular Disease | Admitting: Cardiovascular Disease

## 2018-10-27 ENCOUNTER — Encounter (HOSPITAL_COMMUNITY): Payer: Medicare Other

## 2018-10-27 DIAGNOSIS — Z952 Presence of prosthetic heart valve: Secondary | ICD-10-CM | POA: Diagnosis not present

## 2018-10-28 LAB — CUP PACEART REMOTE DEVICE CHECK
Battery Remaining Longevity: 156 mo
Brady Statistic RV Percent Paced: 99 %
Date Time Interrogation Session: 20191211052200
Implantable Lead Location: 753860
Implantable Lead Model: 7741
Implantable Lead Model: 7742
Implantable Lead Serial Number: 729262
Implantable Pulse Generator Implant Date: 20170510
Lead Channel Impedance Value: 664 Ohm
Lead Channel Pacing Threshold Amplitude: 0.9 V
Lead Channel Pacing Threshold Pulse Width: 0.4 ms
Lead Channel Setting Pacing Amplitude: 1.4 V
MDC IDC LEAD IMPLANT DT: 20170510
MDC IDC LEAD IMPLANT DT: 20170510
MDC IDC LEAD LOCATION: 753859
MDC IDC LEAD SERIAL: 766721
MDC IDC MSMT BATTERY REMAINING PERCENTAGE: 100 %
MDC IDC MSMT LEADCHNL RA IMPEDANCE VALUE: 777 Ohm
MDC IDC PG SERIAL: 748273
MDC IDC SET LEADCHNL RV PACING PULSEWIDTH: 0.4 ms
MDC IDC SET LEADCHNL RV SENSING SENSITIVITY: 2.5 mV
MDC IDC STAT BRADY RA PERCENT PACED: 0 %

## 2018-10-30 ENCOUNTER — Encounter (HOSPITAL_COMMUNITY): Payer: Medicare Other

## 2018-10-30 ENCOUNTER — Encounter (HOSPITAL_COMMUNITY)
Admission: RE | Admit: 2018-10-30 | Discharge: 2018-10-30 | Disposition: A | Discharge status: Home / Self Care | Payer: Medicare Other | Source: Ambulatory Visit | Attending: Cardiovascular Disease | Admitting: Cardiovascular Disease

## 2018-10-30 DIAGNOSIS — Z952 Presence of prosthetic heart valve: Secondary | ICD-10-CM

## 2018-10-31 ENCOUNTER — Telehealth: Payer: Self-pay | Admitting: Neurology

## 2018-10-31 MED ORDER — QUETIAPINE FUMARATE 25 MG PO TABS: 25.0000 mg | ORAL_TABLET | Freq: Every day | ORAL | 3 refills | Status: DC

## 2018-10-31 NOTE — Telephone Encounter (Signed)
I called [facility] and they just need a refill for this medication.  There is no PA required.  Rx will be sent electronically.

## 2018-10-31 NOTE — Telephone Encounter (Signed)
Pts wife (on [place]) states that the pharmacy [facility] needs a PA for QUEtiapine (SEROQUEL) 25 MG tablet  Please advise.

## 2018-11-01 ENCOUNTER — Encounter (HOSPITAL_COMMUNITY): Payer: Medicare Other

## 2018-11-01 ENCOUNTER — Encounter (HOSPITAL_COMMUNITY)
Admission: RE | Admit: 2018-11-01 | Discharge: 2018-11-01 | Disposition: A | Discharge status: Home / Self Care | Payer: Medicare Other | Source: Ambulatory Visit | Attending: Cardiovascular Disease | Admitting: Cardiovascular Disease

## 2018-11-01 VITALS — Ht 70.0 in | Wt 177.5 lb

## 2018-11-01 DIAGNOSIS — Z952 Presence of prosthetic heart valve: Secondary | ICD-10-CM

## 2018-11-03 ENCOUNTER — Encounter (HOSPITAL_COMMUNITY): Payer: Medicare Other

## 2018-11-03 ENCOUNTER — Encounter (HOSPITAL_COMMUNITY)
Admission: RE | Admit: 2018-11-03 | Discharge: 2018-11-03 | Disposition: A | Discharge status: Home / Self Care | Payer: Medicare Other | Source: Ambulatory Visit | Attending: Cardiovascular Disease | Admitting: Cardiovascular Disease

## 2018-11-03 DIAGNOSIS — Z952 Presence of prosthetic heart valve: Secondary | ICD-10-CM

## 2018-11-06 ENCOUNTER — Encounter (HOSPITAL_COMMUNITY)
Admission: RE | Admit: 2018-11-06 | Discharge: 2018-11-06 | Disposition: A | Discharge status: Home / Self Care | Payer: Medicare Other | Source: Ambulatory Visit | Attending: Cardiovascular Disease | Admitting: Cardiovascular Disease

## 2018-11-06 ENCOUNTER — Encounter (HOSPITAL_COMMUNITY): Payer: Medicare Other

## 2018-11-06 DIAGNOSIS — Z952 Presence of prosthetic heart valve: Secondary | ICD-10-CM | POA: Diagnosis not present

## 2018-11-08 ENCOUNTER — Encounter (HOSPITAL_COMMUNITY)
Admission: RE | Admit: 2018-11-08 | Discharge: 2018-11-08 | Disposition: A | Discharge status: Home / Self Care | Payer: Medicare Other | Source: Ambulatory Visit | Attending: Cardiovascular Disease | Admitting: Cardiovascular Disease

## 2018-11-08 ENCOUNTER — Encounter (HOSPITAL_COMMUNITY): Payer: Medicare Other

## 2018-11-08 DIAGNOSIS — Z952 Presence of prosthetic heart valve: Secondary | ICD-10-CM

## 2018-11-09 NOTE — Progress Notes (Signed)
Cardiac Individual Treatment Plan  Patient Details  Name: Jeffery Rogers MRN: [id number] Date of Birth: April 05, 1932 Referring Provider:     CARDIAC REHAB PHASE II ORIENTATION from 08/08/2018 in [place]  Referring Provider  Val Riles MD      Initial Encounter Date:    CARDIAC REHAB PHASE II ORIENTATION from 08/08/2018 in [place]  Date  08/08/18      Visit Diagnosis: 05/30/2018 S/P TAVR (transcatheter aortic valve replacement)  Patient's Home Medications on Admission:  Current Outpatient Medications:  .  amLODipine (NORVASC) 5 MG tablet, Take 1 tablet (5 mg total) by mouth daily., Disp: 90 tablet, Rfl: 3 .  amoxicillin (AMOXIL) 500 MG tablet, Take 4 tablets (2,000 mg) one hour prior to dental visits., Disp: 8 tablet, Rfl: 6 .  apixaban (ELIQUIS) 5 MG TABS tablet, Take 1 tablet (5 mg total) by mouth 2 (two) times daily., Disp: 180 tablet, Rfl: 3 .  aspirin EC 81 MG tablet, Take 81 mg by mouth daily., Disp: , Rfl:  .  atorvastatin (LIPITOR) 40 MG tablet, Take 1 tablet (40 mg total) by mouth daily., Disp: 90 tablet, Rfl: 2 .  Cholecalciferol (VITAMIN D-3) 1000 units CAPS, Take 1,000 Units by mouth daily. , Disp: , Rfl:  .  donepezil (ARICEPT) 10 MG tablet, Take 1 tablet (10 mg total) by mouth at bedtime., Disp: 90 tablet, Rfl: 4 .  ipratropium (ATROVENT) 0.06 % nasal spray, Place 1-2 sprays into both nostrils 4 (four) times daily as needed for rhinitis., Disp: , Rfl:  .  L-Methylfolate-Algae-B12-B6 (METANX) 3-90.314-2-35 MG CAPS, Take 1 capsule by mouth 2 (two) times daily., Disp: 180 capsule, Rfl: 4 .  memantine (NAMENDA) 10 MG tablet, Take 1 tablet (10 mg total) by mouth 2 (two) times daily., Disp: 180 tablet, Rfl: 4 .  QUEtiapine (SEROQUEL) 25 MG tablet, Take 1 tablet (25 mg total) by mouth at bedtime., Disp: 90 tablet, Rfl: 3 .  sertraline (ZOLOFT) 50 MG tablet, Take 50 mg by mouth daily. , Disp: , Rfl:  .   vitamin C (ASCORBIC ACID) 500 MG tablet, Take 500 mg by mouth daily., Disp: , Rfl:   Past Medical History: Past Medical History:  Diagnosis Date  . Arthritis    "right arm/shoulder" (02/10/2016)  . Complete heart block ([place]) 02/10/2016   a. s/p Emergency planning/management officer  . Goiter   . Hypercholesteremia   . Memory loss   . Nasal septal deviation   . Persistent atrial fibrillation   . Presence of permanent cardiac pacemaker   . 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 Severe aortic stenosis   . Tremor     Tobacco Use: Social History   Tobacco Use  Smoking Status Former Smoker  . Packs/day: 1.00  . Years: 16.00  . Pack years: 16.00  . Types: Cigarettes  Smokeless Tobacco Never Used  Tobacco Comment   Quit smoking cigarettes in 1971    Labs: Recent Review Flowsheet Data    Labs for ITP Cardiac and Pulmonary Rehab Latest Ref Rng & Units 05/22/2018 05/30/2018 05/30/2018 05/30/2018 05/30/2018   Hemoglobin A1c 4.8 - 5.6 % 5.5 - - - -   PHART 7.350 - 7.450 7.445 - - - -   PCO2ART 32.0 - 48.0 mmHg 34.3 - - - -   HCO3 20.0 - 28.0 mmol/L 23.2 - - - -  TCO2 22 - 32 mmol/L - 24 24 25 25    ACIDBASEDEF 0.0 - 2.0 mmol/L 0.4 - - - -   O2SAT % 97.9 - - - -      Capillary Blood Glucose: No results found for: GLUCAP   Exercise Target Goals: Exercise Program Goal: Individual exercise prescription set using results from initial 6 min walk test and THRR while considering  patient's activity barriers and safety.   Exercise Prescription Goal: Initial exercise prescription builds to 30-45 minutes a day of aerobic activity, 2-3 days per week.  Home exercise guidelines will be given to patient during program as part of exercise prescription that the participant will acknowledge.  Activity Barriers & Risk Stratification: Activity Barriers & Cardiac Risk Stratification -  08/08/18 1147      Activity Barriers & Cardiac Risk Stratification   Activity Barriers  Balance Concerns;Deconditioning    Cardiac Risk Stratification  High       6 Minute Walk: 6 Minute Walk    Row Name 08/08/18 1144 11/02/18 1157       6 Minute Walk   Phase  Initial  -    Distance  855 feet  1310 feet    Distance % Change  -  53.22 %    Distance Feet Change  -  455 ft    Walk Time  6 minutes  6 minutes    # of Rest Breaks  0  0    MPH  1.62  2.48    METS  0.9  2.21    RPE  9  11    Perceived Dyspnea   0  0    VO2 Peak  3.16  7.7    Symptoms  No  No    Resting HR  60 bpm  71 bpm    Resting BP  120/80  102/66    Resting Oxygen Saturation   98 %  -    Exercise Oxygen Saturation  during 6 min walk  99 %  -    Max Ex. HR  77 bpm  95 bpm    Max Ex. BP  104/60  120/68    2 Minute Post BP  118/56  118/72       Oxygen Initial Assessment:   Oxygen Re-Evaluation:   Oxygen Discharge (Final Oxygen Re-Evaluation):   Initial Exercise Prescription: Initial Exercise Prescription - 08/08/18 1100      Date of Initial Exercise RX and Referring Provider   Date  08/08/18    Referring Provider  Val Riles MD    Expected Discharge Date  11/13/18      T5 Nustep   Level  1    SPM  75    Minutes  20    METs  1      Track   Laps  4    Minutes  10    METs  0.9      Prescription Details   Frequency (times per week)  3x    Duration  Progress to 30 minutes of continuous aerobic without signs/symptoms of physical distress      Intensity   THRR 40-80% of Max Heartrate  54-108    Ratings of Perceived Exertion  11-13    Perceived Dyspnea  0-4      Progression   Progression  Continue progressive overload as per policy without signs/symptoms or physical distress.      Resistance Training   Training Prescription  Yes  Weight  2lbs    Reps  10-15       Perform Capillary Blood Glucose checks as needed.  Exercise Prescription Changes: Exercise Prescription  Changes    Row Name 08/14/18 1600 08/23/18 1333 08/28/18 1031 09/04/18 1346 09/11/18 1600     Response to Exercise   Blood Pressure (Admit)  108/70  117/72  122/78  122/70  104/64   Blood Pressure (Exercise)  118/68  118/58  114/68  118/64  120/66   Blood Pressure (Exit)  110/64  112/60  97/60  108/70  130/70   Heart Rate (Admit)  70 bpm  56 bpm  72 bpm  62 bpm  71 bpm   Heart Rate (Exercise)  110 bpm  104 bpm  92 bpm  102 bpm  109 bpm   Heart Rate (Exit)  60 bpm  60 bpm  70 bpm  60 bpm  58 bpm   Rating of Perceived Exertion (Exercise)  11  11  11  11  11    Perceived Dyspnea (Exercise)  0  0  0  0  0   Symptoms  None  None  None  None  None   Comments  Pt oriented to exercise equipment  None  None  None  None   Duration  Progress to 30 minutes of  aerobic without signs/symptoms of physical distress  Progress to 30 minutes of  aerobic without signs/symptoms of physical distress  Progress to 30 minutes of  aerobic without signs/symptoms of physical distress  Progress to 30 minutes of  aerobic without signs/symptoms of physical distress  Continue with 30 min of aerobic exercise without signs/symptoms of physical distress.   Intensity  THRR unchanged  THRR unchanged  THRR unchanged  THRR unchanged  THRR unchanged     Progression   Progression  Continue to progress workloads to maintain intensity without signs/symptoms of physical distress.  Continue to progress workloads to maintain intensity without signs/symptoms of physical distress.  Continue to progress workloads to maintain intensity without signs/symptoms of physical distress.  Continue to progress workloads to maintain intensity without signs/symptoms of physical distress.  Continue to progress workloads to maintain intensity without signs/symptoms of physical distress.   Average METs  2.54  2.4  2.5  2.9  2.77     Resistance Training   Training Prescription  Yes  No  Yes  No  No   Weight  2lbs  -  2lbs  -  -   Reps  10-15  -  10-15  -  -    Time  10 Minutes  -  10 Minutes  -  -     Interval Training   Interval Training  No  No  No  No  No     T5 Nustep   Level  1  3  4  4  4    SPM  85  95  95  105  105   Minutes  20  20  20  20  20    METs  2.5  3  2.8  2.9  2.8     Track   Laps  9  12  9  11  12    Minutes  10  10  10  10  10    METs  2.53  3.07  2.53  2.91  2.74     Home Exercise Plan   Plans to continue exercise at  -  -  -  -  Home (  comment) Walking   Frequency  -  -  -  -  Add 2 additional days to program exercise sessions.   Initial Home Exercises Provided  -  -  -  -  09/08/18   Row Name 09/18/18 1032 09/29/18 1032 10/09/18 1423 10/20/18 1027 11/03/18 1004     Response to Exercise   Blood Pressure (Admit)  118/70  124/70  112/76  112/68  118/60   Blood Pressure (Exercise)  118/78  148/68  122/60  124/64  120/68   Blood Pressure (Exit)  108/62  98/60  110/70  100/64  108/60   Heart Rate (Admit)  85 bpm  65 bpm  66 bpm  67 bpm  64 bpm   Heart Rate (Exercise)  106 bpm  102 bpm  113 bpm  111 bpm  113 bpm   Heart Rate (Exit)  60 bpm  63 bpm  60 bpm  67 bpm  64 bpm   Rating of Perceived Exertion (Exercise)  12  12  11  11  11    Perceived Dyspnea (Exercise)  0  0  0  0  0   Symptoms  None  None  None  None  None   Comments  None  None  None  None  None   Duration  Continue with 30 min of aerobic exercise without signs/symptoms of physical distress.  Continue with 30 min of aerobic exercise without signs/symptoms of physical distress.  Continue with 30 min of aerobic exercise without signs/symptoms of physical distress.  Continue with 30 min of aerobic exercise without signs/symptoms of physical distress.  Continue with 30 min of aerobic exercise without signs/symptoms of physical distress.   Intensity  THRR unchanged  THRR unchanged  THRR unchanged  THRR unchanged  THRR unchanged     Progression   Progression  Continue to progress workloads to maintain intensity without signs/symptoms of physical distress.  Continue  to progress workloads to maintain intensity without signs/symptoms of physical distress.  Continue to progress workloads to maintain intensity without signs/symptoms of physical distress.  Continue to progress workloads to maintain intensity without signs/symptoms of physical distress.  Continue to progress workloads to maintain intensity without signs/symptoms of physical distress.   Average METs  2.87  2.87  3  3.14  3     Resistance Training   Training Prescription  Yes  No  No  Yes  Yes   Weight  2lbs  -  -  2lbs  2lbs   Reps  10-15  -  -  10-15  10-15   Time  10 Minutes  -  -  10 Minutes  10 Minutes     Interval Training   Interval Training  No  No  No  No  No     T5 Nustep   Level  4  4  5  5  5    SPM  105  105  110  110  110   Minutes  20  20  20  20  20    METs  3  3  3.2  3.2  3.2     Track   Laps  10  11  11  12  12    Minutes  10  10  10  10  10    METs  2.76  2.91  2.91  3.07  3.07     Home Exercise Plan   Plans to continue exercise at  Home (comment) Walking  Home (  comment) Walking  Home (comment) Walking  Home (comment) Walking  Home (comment) Walking   Frequency  Add 2 additional days to program exercise sessions.  Add 2 additional days to program exercise sessions.  Add 2 additional days to program exercise sessions.  Add 2 additional days to program exercise sessions.  Add 2 additional days to program exercise sessions.   Initial Home Exercises Provided  09/08/18  09/08/18  09/08/18  09/08/18  09/08/18      Exercise Comments: Exercise Comments    Row Name 08/14/18 1624 09/08/18 1614 10/12/18 1424 11/09/18 1151     Exercise Comments  Pt's first day of exercise. Pt oriented to exercise equipment. Pt responded well to workloads, will continue to monitor and progress pt as tolerated.   Reviewed HEP with pt's wife. Pt is not exericising at home and give family attitude and pushback when they try and get him to go to the [facility]. Pt has dementia, but enjoys coming to Cardiac  Rehab.   Pt is continuing to respond well to exercise prescription. Pt's family he is enjoying coming to rehab. Will continue to monitor pt.   Pt is responding well to workloads. Spoke with pt's family regarding plans for exercise post rehab. Family is deciding between CR maintenance program and local [facility] program.        Exercise Goals and Review: Exercise Goals    Row Name 08/08/18 1146             Exercise Goals   Increase Physical Activity  Yes       Intervention  Provide advice, education, support and counseling about physical activity/exercise needs.;Develop an individualized exercise prescription for aerobic and resistive training based on initial evaluation findings, risk stratification, comorbidities and participant's personal goals.       Expected Outcomes  Short Term: Attend rehab on a regular basis to increase amount of physical activity.;Long Term: Add in home exercise to make exercise part of routine and to increase amount of physical activity.;Long Term: Exercising regularly at least 3-5 days a week.       Increase Strength and Stamina  Yes       Intervention  Provide advice, education, support and counseling about physical activity/exercise needs.;Develop an individualized exercise prescription for aerobic and resistive training based on initial evaluation findings, risk stratification, comorbidities and participant's personal goals.       Expected Outcomes  Short Term: Perform resistance training exercises routinely during rehab and add in resistance training at home;Short Term: Increase workloads from initial exercise prescription for resistance, speed, and METs.;Long Term: Improve cardiorespiratory fitness, muscular endurance and strength as measured by increased METs and functional capacity (6MWT)       Able to understand and use rate of perceived exertion (RPE) scale  Yes       Intervention  Provide education and explanation on how to use RPE scale       Expected Outcomes   Short Term: Able to use RPE daily in rehab to express subjective intensity level;Long Term:  Able to use RPE to guide intensity level when exercising independently       Knowledge and understanding of Target Heart Rate Range (THRR)  Yes       Intervention  Provide education and explanation of THRR including how the numbers were predicted and where they are located for reference       Expected Outcomes  Short Term: Able to state/look up THRR;Long Term: Able to use THRR to govern  intensity when exercising independently;Short Term: Able to use daily as guideline for intensity in rehab       Able to check pulse independently  Yes       Intervention  Provide education and demonstration on how to check pulse in carotid and radial arteries.;Review the importance of being able to check your own pulse for safety during independent exercise       Expected Outcomes  Short Term: Able to explain why pulse checking is important during independent exercise;Long Term: Able to check pulse independently and accurately       Understanding of Exercise Prescription  Yes       Intervention  Provide education, explanation, and written materials on patient's individual exercise prescription       Expected Outcomes  Short Term: Able to explain program exercise prescription;Long Term: Able to explain home exercise prescription to exercise independently          Exercise Goals Re-Evaluation : Exercise Goals Re-Evaluation    Row Name 09/08/18 1615 10/12/18 1432 11/09/18 1151 11/09/18 1157       Exercise Goal Re-Evaluation   Exercise Goals Review  Increase Physical Activity;Able to understand and use rate of perceived exertion (RPE) scale;Knowledge and understanding of Target Heart Rate Range (THRR);Understanding of Exercise Prescription;Increase Strength and Stamina;Able to check pulse independently  Increase Physical Activity;Understanding of Exercise Prescription  Increase Physical Activity;Understanding of Exercise  Prescription  Increase Physical Activity;Understanding of Exercise Prescription    Comments  Reviewed HEP with pt's wife, due to pt having dementia. Spoke with wife about the importance of exercising most days of the week. Spoke with pt's wife about pt using [employer] and possibly using that at [employer].   Pt puts forth great effort with exercise. Pt is continuing to increase cardiovascular strength. Will continue to monitor and progress pt.   -  Pt puts forth great effort with exercise. Pt is continuing to increase cardiovascular strength. Will continue to monitor and progress pt.     Expected Outcomes  Family will try and continue to work with pt to try and exercise at home. Family has a hard time getting pt to exercise at home due to resistance from pt. Will continue to help family with pt exercising on his own.   Family is still having trouble getting pt to move at home. Pt will only exercise when he comes to rehab. will continue to try and brainstorm with family on how to help get pt moving more at home.   -  Family is still having trouble getting pt to move at home. Pt will only exercise when he comes to rehab. will continue to try and brainstorm with family on how to help get pt moving more at home.        Discharge Exercise Prescription (Final Exercise Prescription Changes): Exercise Prescription Changes - 11/03/18 1004      Response to Exercise   Blood Pressure (Admit)  118/60    Blood Pressure (Exercise)  120/68    Blood Pressure (Exit)  108/60    Heart Rate (Admit)  64 bpm    Heart Rate (Exercise)  113 bpm    Heart Rate (Exit)  64 bpm    Rating of Perceived Exertion (Exercise)  11    Perceived Dyspnea (Exercise)  0    Symptoms  None    Comments  None    Duration  Continue with 30 min of aerobic exercise without signs/symptoms of physical distress.  Intensity  THRR unchanged      Progression   Progression  Continue to progress workloads to maintain intensity without  signs/symptoms of physical distress.    Average METs  3      Resistance Training   Training Prescription  Yes    Weight  2lbs    Reps  10-15    Time  10 Minutes      Interval Training   Interval Training  No      T5 Nustep   Level  5    SPM  110    Minutes  20    METs  3.2      Track   Laps  12    Minutes  10    METs  3.07      Home Exercise Plan   Plans to continue exercise at  Home (comment)   Walking   Frequency  Add 2 additional days to program exercise sessions.    Initial Home Exercises Provided  09/08/18       Nutrition:  Target Goals: Understanding of nutrition guidelines, daily intake of sodium 1500mg , cholesterol 200mg , calories 30% from fat and 7% or less from saturated fats, daily to have 5 or more servings of fruits and vegetables.  Biometrics: Pre Biometrics - 08/08/18 1146      Pre Biometrics   Height  5\' 10"  (1.778 m)    Weight  80.9 kg    Waist Circumference  39 inches    Hip Circumference  41 inches    Waist to Hip Ratio  0.95 %    BMI (Calculated)  25.59    Triceps Skinfold  35 mm    % Body Fat  30.2 %    Grip Strength  38 kg    Flexibility  0 in    Single Leg Stand  0 seconds      Post Biometrics - 11/02/18 1159       Post  Biometrics   Height  5\' 10"  (1.778 m)    Weight  80.5 kg    Waist Circumference  38.5 inches    Hip Circumference  41 inches    Waist to Hip Ratio  0.94 %    BMI (Calculated)  25.46    Triceps Skinfold  5 mm    % Body Fat  22 %    Grip Strength  43 kg    Flexibility  0 in    Single Leg Stand  0 seconds       Nutrition Therapy Plan and Nutrition Goals: Nutrition Therapy & Goals - 08/08/18 1042      Nutrition Therapy   Diet  general healthful      Personal Nutrition Goals   Nutrition Goal  Pt to identify and limit food sources of saturated fat, trans fat, refined carbohydrates and sodium    Personal Goal #2  Pt to eat complex carbs at breakfast    Personal Goal #3  Pt able to name foods that affect  blood glucose      Intervention Plan   Intervention  Prescribe, educate and counsel regarding individualized specific dietary modifications aiming towards targeted core components such as weight, hypertension, lipid management, diabetes, heart failure and other comorbidities.    Expected Outcomes  Short Term Goal: Understand basic principles of dietary content, such as calories, fat, sodium, cholesterol and nutrients.;Long Term Goal: Adherence to prescribed nutrition plan.       Nutrition Assessments: Nutrition Assessments - 08/08/18 1041  MEDFICTS Scores   Pre Score  18       Nutrition Goals Re-Evaluation: Nutrition Goals Re-Evaluation    Row Name 08/08/18 4401             Personal Goal #3 Re-Evaluation   Personal Goal #3  Pt able to name foods that affect blood glucose          Nutrition Goals Re-Evaluation: Nutrition Goals Re-Evaluation    [place] Name 08/08/18 0272             Personal Goal #3 Re-Evaluation   Personal Goal #3  Pt able to name foods that affect blood glucose          Nutrition Goals Discharge (Final Nutrition Goals Re-Evaluation): Nutrition Goals Re-Evaluation - 08/08/18 0937      Personal Goal #3 Re-Evaluation   Personal Goal #3  Pt able to name foods that affect blood glucose       Psychosocial: Target Goals: Acknowledge presence or absence of significant depression and/or stress, maximize coping skills, provide positive support system. Participant is able to verbalize types and ability to use techniques and skills needed for reducing stress and depression.  Initial Review & Psychosocial Screening: Initial Psych Review & Screening - 08/08/18 0917      Initial Review   Current issues with  Current Sleep Concerns;Current Anxiety/Panic   difficult to assess with dementia diagnosis     Family Dynamics   Good Support System?  Yes   spouse, children      Barriers   Psychosocial barriers to participate in program  Psychosocial barriers  identified (see note)   dementia      Screening Interventions   Interventions  Encouraged to exercise       Quality of Life Scores: Quality of Life - 08/08/18 0919      Quality of Life   Select  --   unable to assess due to dementia      Scores of 19 and below usually indicate a poorer quality of life in these areas.  A difference of  2-3 points is a clinically meaningful difference.  A difference of 2-3 points in the total score of the Quality of Life Index has been associated with significant improvement in overall quality of life, self-image, physical symptoms, and general health in studies assessing change in quality of life.  PHQ-9: Recent Review Flowsheet Data    There is no flowsheet data to display.     Interpretation of Total Score  Total Score Depression Severity:  1-4 = Minimal depression, 5-9 = Mild depression, 10-14 = Moderate depression, 15-19 = Moderately severe depression, 20-27 = Severe depression   Psychosocial Evaluation and Intervention: Psychosocial Evaluation - 08/14/18 1215      Psychosocial Evaluation & Interventions   Interventions  Encouraged to exercise with the program and follow exercise prescription;Relaxation education    Comments  Sleep concerns were reported at orientation.  This is difficult to assess with patient's dementia.  Pt enjoys participating in a social group that meets regularly on Tuesdays and Thursdays.     Expected Outcomes  Barbarann Ehlers will report lessened cocners with sleep.     Continue Psychosocial Services   Follow up required by staff       Psychosocial Re-Evaluation: Psychosocial Re-Evaluation    [place] Name 08/24/18 0745 09/14/18 1224 10/11/18 1701 11/09/18 1013       Psychosocial Re-Evaluation   Current issues with  Current Sleep Concerns  None Identified  None Identified  None Identified    Comments  No interventions necessary.  No interventions necessary.  No interventions necessary.  No interventions necessary.    Expected  Outcomes  Barbarann Ehlers will have less concerns of sleep.   Barbarann Ehlers will maintain a positive outlook.   Barbarann Ehlers will maintain a positive outlook using a good coping skills.   Barbarann Ehlers will maintain a positive outlook using a good coping skills.     Interventions  Encouraged to attend Cardiac Rehabilitation for the exercise  Encouraged to attend Cardiac Rehabilitation for the exercise  Encouraged to attend Cardiac Rehabilitation for the exercise  Encouraged to attend Cardiac Rehabilitation for the exercise    Continue Psychosocial Services   Follow up required by staff  No Follow up required  No Follow up required  No Follow up required       Psychosocial Discharge (Final Psychosocial Re-Evaluation): Psychosocial Re-Evaluation - 11/09/18 1013      Psychosocial Re-Evaluation   Current issues with  None Identified    Comments  No interventions necessary.    Expected Outcomes  Barbarann Ehlers will maintain a positive outlook using a good coping skills.     Interventions  Encouraged to attend Cardiac Rehabilitation for the exercise    Continue Psychosocial Services   No Follow up required       Vocational Rehabilitation: Provide vocational rehab assistance to qualifying candidates.   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 Participant will state understanding/return demonstration of topics presented.  Learning Barriers/Preferences: Learning Barriers/Preferences - 08/08/18 1139      Learning Barriers/Preferences   Learning Barriers  Hearing   Pt has dementia    Learning Preferences  Skilled Demonstration;Individual Instruction       Education Topics: Count Your Pulse:  -Group instruction provided by verbal instruction,  demonstration, patient participation and written materials to support subject.  Instructors address importance of being able to find your pulse and how to count your pulse when at home without a heart monitor.  Patients get hands on experience counting their pulse with staff help and individually.   CARDIAC REHAB PHASE II EXERCISE from 11/01/2018 in [place]  Date  09/22/18  Educator  RN  Instruction Review Code  2- Demonstrated Understanding      Heart Attack, Angina, and Risk Factor Modification:  -Group instruction provided by verbal instruction, video, and written materials to support subject.  Instructors address signs and symptoms of angina and heart attacks.    Also discuss risk factors for heart disease and how to make changes to improve heart health risk factors.   CARDIAC REHAB PHASE II EXERCISE from 11/01/2018 in [place]  Date  09/13/18  Educator  RN  Instruction Review Code  2- Demonstrated Understanding      Functional Fitness:  -Group instruction provided by verbal instruction, demonstration, patient participation, and written materials to support subject.  Instructors address safety measures for doing things around the house.  Discuss how to get up and down off the floor, how to pick things up properly, how to safely get out of a chair without assistance, and balance training.   CARDIAC REHAB PHASE II EXERCISE from 11/01/2018 in [facility]  [place]  Date  10/20/18  Educator  EP  Instruction Review Code  2- Demonstrated Understanding      Meditation and Mindfulness:  -Group instruction provided by verbal instruction, patient participation, and written materials to support subject.  Instructor addresses importance of mindfulness and meditation practice to help reduce stress and improve awareness.  Instructor also leads participants through a meditation exercise.    Stretching for  Flexibility and Mobility:  -Group instruction provided by verbal instruction, patient participation, and written materials to support subject.  Instructors lead participants through series of stretches that are designed to increase flexibility thus improving mobility.  These stretches are additional exercise for major muscle groups that are typically performed during regular warm up and cool down.   Hands Only CPR:  -Group verbal, video, and participation provides a basic overview of AHA guidelines for community CPR. Role-play of emergencies allow participants the opportunity to practice calling for help and chest compression technique with discussion of AED use.   Hypertension: -Group verbal and written instruction that provides a basic overview of hypertension including the most recent diagnostic guidelines, risk factor reduction with self-care instructions and medication management.    Nutrition I class: Heart Healthy Eating:  -Group instruction provided by PowerPoint slides, verbal discussion, and written materials to support subject matter. The instructor gives an explanation and review of the Therapeutic Lifestyle Changes diet recommendations, which includes a discussion on lipid goals, dietary fat, sodium, fiber, plant stanol/sterol esters, sugar, and the components of a well-balanced, healthy diet.   Nutrition II class: Lifestyle Skills:  -Group instruction provided by PowerPoint slides, verbal discussion, and written materials to support subject matter. The instructor gives an explanation and review of label reading, grocery shopping for heart health, heart healthy recipe modifications, and ways to make healthier choices when eating out.   Diabetes Question & Answer:  -Group instruction provided by PowerPoint slides, verbal discussion, and written materials to support subject matter. The instructor gives an explanation and review of diabetes co-morbidities, pre- and post-prandial blood  glucose goals, pre-exercise blood glucose goals, signs, symptoms, and treatment of hypoglycemia and hyperglycemia, and foot care basics.   Diabetes Blitz:  -Group instruction provided by PowerPoint slides, verbal discussion, and written materials to support subject matter. The instructor gives an explanation and review of the physiology behind type 1 and type 2 diabetes, diabetes medications and rational behind using different medications, pre- and post-prandial blood glucose recommendations and Hemoglobin A1c goals, diabetes diet, and exercise including blood glucose guidelines for exercising safely.    Portion Distortion:  -Group instruction provided by PowerPoint slides, verbal discussion, written materials, and food models to support subject matter. The instructor gives an explanation of serving size versus portion size, changes in portions sizes over the last 20 years, and what consists of a serving from each food group.   Stress Management:  -Group instruction provided by verbal instruction, video, and written materials to support subject matter.  Instructors review role of stress in heart disease and how to cope with stress positively.     CARDIAC REHAB PHASE II EXERCISE from 11/01/2018 in [place]  Date  11/01/18  Educator  RN  Instruction Review Code  2- Demonstrated Understanding      Exercising on Your Own:  -Group instruction provided by verbal instruction, power point, and written materials to support subject.  Instructors discuss benefits of exercise, components of exercise, frequency and intensity of exercise, and end points  for exercise.  Also discuss use of nitroglycerin and activating EMS.  Review options of places to exercise outside of rehab.  Review guidelines for sex with heart disease.   Cardiac Drugs I:  -Group instruction provided by verbal instruction and written materials to support subject.  Instructor reviews cardiac drug classes:  antiplatelets, anticoagulants, beta blockers, and statins.  Instructor discusses reasons, side effects, and lifestyle considerations for each drug class.   CARDIAC REHAB PHASE II EXERCISE from 11/01/2018 in [place]  Date  10/11/18  Educator  Pharmacist  Instruction Review Code  2- Demonstrated Understanding      Cardiac Drugs II:  -Group instruction provided by verbal instruction and written materials to support subject.  Instructor reviews cardiac drug classes: angiotensin converting enzyme inhibitors (ACE-I), angiotensin II receptor blockers (ARBs), nitrates, and calcium channel blockers.  Instructor discusses reasons, side effects, and lifestyle considerations for each drug class.   CARDIAC REHAB PHASE II EXERCISE from 11/01/2018 in [place]  Date  09/06/18  Educator  PharmD  Instruction Review Code  2- Demonstrated Understanding      Anatomy and Physiology of the Circulatory System:  Group verbal and written instruction and models provide basic cardiac anatomy and physiology, with the coronary electrical and arterial systems. Review of: AMI, Angina, Valve disease, Heart Failure, Peripheral Artery Disease, Cardiac Arrhythmia, Pacemakers, and the ICD.   Other Education:  -Group or individual verbal, written, or video instructions that support the educational goals of the cardiac rehab program.   Holiday Eating Survival Tips:  -Group instruction provided by PowerPoint slides, verbal discussion, and written materials to support subject matter. The instructor gives patients tips, tricks, and techniques to help them not only survive but enjoy the holidays despite the onslaught of food that accompanies the holidays.   Knowledge Questionnaire Score: Knowledge Questionnaire Score - 08/08/18 0919      Knowledge Questionnaire Score   Pre Score  20/24, quiz completed by pt wife        Core Components/Risk Factors/Patient  Goals at Admission: Personal Goals and Risk Factors at Admission - 08/08/18 1141      Core Components/Risk Factors/Patient Goals on Admission    Weight Management  Yes;Weight Loss    Intervention  Weight Management: Develop a combined nutrition and exercise program designed to reach desired caloric intake, while maintaining appropriate intake of nutrient and fiber, sodium and fats, and appropriate energy expenditure required for the weight goal.;Weight Management: Provide education and appropriate resources to help participant work on and attain dietary goals.;Weight Management/Obesity: Establish reasonable short term and long term weight goals.    Admit Weight  178 lb 5.6 oz (80.9 kg)    Goal Weight: Long Term  170 lb (77.1 kg)    Expected Outcomes  Short Term: Continue to assess and modify interventions until short term weight is achieved;Long Term: Adherence to nutrition and physical activity/exercise program aimed toward attainment of established weight goal;Weight Loss: Understanding of general recommendations for a balanced deficit meal plan, which promotes 1-2 lb weight loss per week and includes a negative energy balance of [phone] kcal/d;Understanding recommendations for meals to include 15-35% energy as protein, 25-35% energy from fat, 35-60% energy from carbohydrates, less than 200mg  of dietary cholesterol, 20-35 gm of total fiber daily;Understanding of distribution of calorie intake throughout the day with the consumption of 4-5 meals/snacks    Hypertension  Yes    Intervention  Provide education on lifestyle modifcations including regular  physical activity/exercise, weight management, moderate sodium restriction and increased consumption of fresh fruit, vegetables, and low fat dairy, alcohol moderation, and smoking cessation.;Monitor prescription use compliance.    Expected Outcomes  Short Term: Continued assessment and intervention until BP is < 140/28mm HG in hypertensive participants. <  130/78mm HG in hypertensive participants with diabetes, heart failure or chronic kidney disease.;Long Term: Maintenance of blood pressure at goal levels.    Lipids  Yes    Intervention  Provide education and support for participant on nutrition & aerobic/resistive exercise along with prescribed medications to achieve LDL 70mg , HDL >40mg .    Expected Outcomes  Short Term: Participant states understanding of desired cholesterol values and is compliant with medications prescribed. Participant is following exercise prescription and nutrition guidelines.;Long Term: Cholesterol controlled with medications as prescribed, with individualized exercise RX and with personalized nutrition plan. Value goals: LDL < 70mg , HDL > 40 mg.       Core Components/Risk Factors/Patient Goals Review:  Goals and Risk Factor Review    Row Name 08/14/18 1217 09/14/18 1225 10/11/18 1701 11/09/18 1013       Core Components/Risk Factors/Patient Goals Review   Personal Goals Review  Hypertension;Lipids;Weight Management/Obesity  Hypertension;Lipids;Weight Management/Obesity  Hypertension;Lipids;Weight Management/Obesity  Hypertension;Lipids;Weight Management/Obesity    Review  Pt with multiple CAD RFs willing to participate in CR exercise. Pt would like to increase his walking distance and comfort with walking.  Pt with multiple CAD RFs willing to participate in CR exercise. Barbarann Ehlers has been tolerating exercise with increasing workloads.   Pt with multiple CAD RFs willing to participate in CR exercise. Barbarann Ehlers has been tolerating exercise well with increasing workloads.   Pt with multiple CAD RFs willing to participate in CR exercise.  Barbarann Ehlers continues to tolerate increased workloads.  He will graduate 11/13/2018 with 36 complete sessions.  He has done very well.     Expected Outcomes  Pt will continue to participate in CR exercise, nutrition, and lifestyle modification opportunities.   Pt will continue to participate in CR exercise,  nutrition, and lifestyle modification opportunities.   Pt will continue to participate in CR exercise, nutrition, and lifestyle modification opportunities.   Pt will continue to participate in CR exercise, nutrition, and lifestyle modification opportunities.        Core Components/Risk Factors/Patient Goals at Discharge (Final Review):  Goals and Risk Factor Review - 11/09/18 1013      Core Components/Risk Factors/Patient Goals Review   Personal Goals Review  Hypertension;Lipids;Weight Management/Obesity    Review  Pt with multiple CAD RFs willing to participate in CR exercise.  Barbarann Ehlers continues to tolerate increased workloads.  He will graduate 11/13/2018 with 36 complete sessions.  He has done very well.     Expected Outcomes  Pt will continue to participate in CR exercise, nutrition, and lifestyle modification opportunities.        ITP Comments: ITP Comments    Row Name 08/08/18 2924 08/14/18 1523 09/14/18 1223 10/11/18 1700 11/09/18 1012   ITP Comments  Dr.Traci Turner, Medical Director   30 Day ITP Review.  Pt started exercise and tolerated exercise well.  30 Day ITP Review.  Barbarann Ehlers is tolerating exercise well with increased loads.  VSS.   30 Day ITP Review.  Barbarann Ehlers continues to tolerate increased workloads.  His family does not report anymore sleep concerns.   30 Day ITP Review.  Barbarann Ehlers continues to tolerate increased workloads.  He will graduate 11/13/2018 with 36 complete sessions.  He has done very well.  Comments: See ITP Comments.

## 2018-11-10 ENCOUNTER — Encounter (HOSPITAL_COMMUNITY): Payer: Medicare Other

## 2018-11-10 ENCOUNTER — Encounter (HOSPITAL_COMMUNITY)
Admission: RE | Admit: 2018-11-10 | Discharge: 2018-11-10 | Disposition: A | Discharge status: Home / Self Care | Payer: Medicare Other | Source: Ambulatory Visit | Attending: Cardiovascular Disease | Admitting: Cardiovascular Disease

## 2018-11-10 DIAGNOSIS — Z952 Presence of prosthetic heart valve: Secondary | ICD-10-CM | POA: Diagnosis not present

## 2018-11-13 ENCOUNTER — Encounter (HOSPITAL_COMMUNITY): Payer: Medicare Other

## 2018-11-13 ENCOUNTER — Telehealth: Payer: Self-pay | Admitting: Internal Medicine

## 2018-11-13 DIAGNOSIS — W540XXA Bitten by dog, initial encounter: Secondary | ICD-10-CM | POA: Diagnosis not present

## 2018-11-13 DIAGNOSIS — Z7901 Long term (current) use of anticoagulants: Secondary | ICD-10-CM | POA: Diagnosis not present

## 2018-11-13 DIAGNOSIS — S61459A Open bite of unspecified hand, initial encounter: Secondary | ICD-10-CM | POA: Diagnosis not present

## 2018-11-13 DIAGNOSIS — J309 Allergic rhinitis, unspecified: Secondary | ICD-10-CM | POA: Diagnosis not present

## 2018-11-13 NOTE — Telephone Encounter (Signed)
[name] was calling on behalf of the pt. Pt sent MY CHART message yesterday 11/12/18 that he was bitten by a dog yesterday was asking what he should do since he has had a valve replacement. Willeen Cass, RN replied back to the pt today through [place] which the pt has reviewed at 10:10 AM today. [name] was calling a few minutes ago and wants to  Know what to do. I advised her that Willeen Cass , RN sent [facility] CHART reply today and that Mr. Aikey read the reply. I reiterated the MY CHART per Willeen Cass as to either take the pt to Urgent Care or PCP for further evaluation. [name] states she hates going to Urgent Care and said she could take him to PCP but they don't know abnything about his valve replacement. I assured her that the important thing is to make sure pt's wound is checked and evaluated for possibly may need antibiotic. I assured her that if PCP has any questions about valve replacement they can call the office. [name] thanked me for the call. I will send message to Willeen Cass, RN as Juluis Rainier.

## 2018-11-13 NOTE — Telephone Encounter (Signed)
New Message     Pt was bitten by a dog yesterday, it broke the skin. She wants to know if pt should t go to the doctor, since he had a valve replacement?

## 2018-11-15 ENCOUNTER — Encounter (HOSPITAL_COMMUNITY): Payer: Medicare Other

## 2018-11-15 ENCOUNTER — Ambulatory Visit (INDEPENDENT_AMBULATORY_CARE_PROVIDER_SITE_OTHER): Payer: Medicare Other | Admitting: Neurology

## 2018-11-15 ENCOUNTER — Encounter: Payer: Self-pay | Admitting: Neurology

## 2018-11-15 VITALS — BP 122/66 | HR 60 | Ht 70.0 in | Wt 180.0 lb

## 2018-11-15 DIAGNOSIS — G3184 Mild cognitive impairment, so stated: Secondary | ICD-10-CM | POA: Diagnosis not present

## 2018-11-15 DIAGNOSIS — G250 Essential tremor: Secondary | ICD-10-CM | POA: Diagnosis not present

## 2018-11-15 NOTE — Progress Notes (Signed)
PATIENT: Jeffery Rogers DOB: [birth year]  Chief Complaint  Patient presents with  . Mild Cognitive Impairment    MMSE 23/30 - 7 animals.  He is here with his wife, Jeffery Rogers and his caregiver, Jeffery Rogers.  He is here for his routine follow up with no new concerns.     HISTORICAL  Jeffery Rogers is 85 years old right-handed male, accompanied by [name] and [name] for evaluation of memory loss, tremor, dizziness, he is referred by his primary care physician [name], initial evaluation was on January 14 2017.  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  There was no significant family history of memory loss  Family history of bilateral hands tremor, gradually getting worse, no gait abnormality, his daughter also suffered bilateral hands tremor,  Dizziness since January 2018, no dizziness, lightheadedness in the sitting down or lying down position, most noticeable when he get up quickly from seated position, overnight sleeping, he has transient lightheadedness, he denies significant low back pain, no gait abnormality, no lower extremity paresthesia notice, but on today's examination was noted to have mild length dependent sensory changes, suggestive of peripheral neuropathy  He also complains of new onset intermittent binocular double vision during his vestibular rehabilitation only  UPDATE May 11 2017: Laboratory evaluations showed mild elevated A1c 5.8, otherwise negative or normal RPR, B12, TSH, CBC,  acetylcholine receptor antibody, ESR C-reactive protein, positive ANA, elevated CPK 546  Electrodiagnostic study today showed length dependent peripheral neuropathy, in addition, there is evidence of mild chronic neuropathic changes involving bilateral L4 nerve roots. He has intermittent low back pain, but denies low back pain now, denies significant gait abnormality.  The also personally reviewed CT head without contrast, generalized atrophy, mild supratentorium small vessel disease.  He continues to complain short term memroy loss, wife reported agitation occasionally.   UPDATE Nov 14 2017: He is accompanied by his family at today's clinic visit, he is overall doing very well, status 25 out of 30, continue to exercise regularly, mild increased bilateral hands tremor.  UPDATE Nov 15 2018: He had severe aortic stenosis, had transcatheter aortic valve replacement on May 30, 2018, recovered very well, on Eliquis treatment  He continue have slow decline memory loss, Mini-Mental Status Examination 23 out of 30 today, taking Aricept, Namenda, Seroquel 25 mg every night, sleep 10 to 12 hours every night   REVIEW OF SYSTEMS: Full 14 system review of systems performed and notable only for as above  All rest review of the system were negative ALLERGIES: No Known Allergies  HOME MEDICATIONS: Current Outpatient Medications  Medication Sig Dispense Refill  . amoxicillin (AMOXIL) 500 MG tablet Take 4 tablets (2,000 mg) one hour prior to dental visits. 8 tablet 6  . apixaban (ELIQUIS) 5 MG TABS tablet Take 1 tablet (5 mg total) by mouth 2 (two) times daily. 180 tablet 3  . aspirin EC 81 MG tablet Take 81 mg by mouth daily.    Marland Kitchen atorvastatin (LIPITOR) 40 MG tablet Take 1 tablet (40 mg  total) by mouth daily. 90 tablet 2  . Cholecalciferol (VITAMIN D-3) 1000 units CAPS Take 1,000 Units by mouth daily.     Marland Kitchen donepezil (ARICEPT) 10 MG tablet Take 1 tablet (10 mg total) by mouth at bedtime. 90 tablet 4   . ipratropium (ATROVENT) 0.06 % nasal spray Place 1-2 sprays into both nostrils 4 (four) times daily as needed for rhinitis.    Marland Kitchen L-Methylfolate-Algae-B12-B6 (METANX) 3-90.314-2-35 MG CAPS Take 1 capsule by mouth 2 (two) times daily. 180 capsule 4  . memantine (NAMENDA) 10 MG tablet Take 1 tablet (10 mg total) by mouth 2 (two) times daily. 180 tablet 4  . QUEtiapine (SEROQUEL) 25 MG tablet Take 1 tablet (25 mg total) by mouth at bedtime. 90 tablet 3  . sertraline (ZOLOFT) 50 MG tablet Take 50 mg by mouth daily.     . vitamin C (ASCORBIC ACID) 500 MG tablet Take 500 mg by mouth daily.    Marland Kitchen amLODipine (NORVASC) 5 MG tablet Take 1 tablet (5 mg total) by mouth daily. 90 tablet 3   No current facility-administered medications for this visit.     PAST MEDICAL HISTORY: Past Medical History:  Diagnosis Date  . Arthritis    "right arm/shoulder" (02/10/2016)  . Complete heart block ([place]) 02/10/2016   a. s/p Emergency planning/management officer  . Goiter   . Hypercholesteremia   . Memory loss   . Nasal septal deviation   . Persistent atrial fibrillation   . Presence of permanent cardiac pacemaker   . 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 Severe aortic stenosis   . Tremor     PAST SURGICAL HISTORY: Past Surgical History:  Procedure Laterality Date  . CATARACT EXTRACTION W/ INTRAOCULAR LENS  IMPLANT, BILATERAL Bilateral ~ 2000  . EP IMPLANTABLE DEVICE N/A 02/11/2016   Procedure: Pacemaker Implant;  Surgeon: Askari Lance, MD;  Location: [facility];  Service: Cardiovascular;  Laterality: N/A;  . RIGHT/LEFT HEART CATH AND CORONARY ANGIOGRAPHY N/A 05/03/2018   Procedure: RIGHT/LEFT HEART CATH AND CORONARY ANGIOGRAPHY;  Surgeon: Burnell Blanks, MD;  Location: [facility];  Service: Cardiovascular;  Laterality: N/A;  . TEE WITHOUT CARDIOVERSION N/A 05/30/2018    Procedure: TRANSESOPHAGEAL ECHOCARDIOGRAM (TEE);  Surgeon: Burnell Blanks, MD;  Location: [facility];  Service: Open Heart Surgery;  Laterality: N/A;  . TONSILLECTOMY  1930s  . TRANSCATHETER AORTIC VALVE REPLACEMENT, TRANSFEMORAL N/A 05/30/2018   Procedure: TRANSCATHETER AORTIC VALVE REPLACEMENT, TRANSFEMORAL using a 70m Edwards Sapien 3 Aortic Valve;  Surgeon: MBurnell Blanks MD;  Location: [facility]  Service: Open Heart Surgery;  Laterality: N/A;  . WISDOM TOOTH EXTRACTION  1990s    FAMILY HISTORY: Family History  Problem Relation Age of Onset  . Stroke Other   . Irregular heart beat Other   . Stroke Mother   . Stroke Father   . CAD Father        MI    SOCIAL HISTORY:  Social History   Socioeconomic History  . Marital status: Married    Spouse name: Not on file  . Number of children: 2  . Years of education: Bachelors  . Highest education level: Not on file  Occupational History  . 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 Financial resource strain: Not on file  . Food insecurity:    Worry: Not on file  Inability: Not on file  . Transportation needs:    Medical: Not on file    Non-medical: Not on file  Tobacco Use  . Smoking status: Former Smoker    Packs/day: 1.00    Years: 16.00    Pack years: 16.00    Types: Cigarettes  . Smokeless tobacco: Never Used  . Tobacco comment: Quit smoking cigarettes in 1971  Substance and Sexual Activity  . Alcohol use: Never    Alcohol/week: 2.0 standard drinks    Types: 1 Cans of beer, 1 Shots of liquor per week    Frequency: Never  . Drug use: No  . Sexual activity: Yes  Lifestyle  . Physical activity:    Days per week: Not on file    Minutes per session: Not on file  . Stress: Not on file  Relationships  . Social connections:    Talks on phone: Not on file    Gets together: Not on file    Attends religious service: Not on file    Active member of club or organization: Not on file    Attends meetings  of clubs or organizations: Not on file    Relationship status: Not on file  . Intimate partner violence:    Fear of current or ex partner: Not on file    Emotionally abused: Not on file    Physically abused: Not on file    Forced sexual activity: Not on file  Other Topics Concern  . Not on file  Social History Narrative   Lives at home with his wife.   Right-handed.   1 cup coffee per day.  Occasional Coke.     PHYSICAL EXAM   Vitals:   11/15/18 0928  BP: 122/66  Pulse: 60  Weight: 180 lb (81.6 kg)  Height: 5' 10"  (1.778 m)    Not recorded      Body mass index is 25.83 kg/m.  PHYSICAL EXAMNIATION:  Gen: NAD, conversant, well nourised, obese, well groomed                     Cardiovascular: Regular rate rhythm, no peripheral edema, warm, nontender. Eyes: Conjunctivae clear without exudates or hemorrhage Neck: Supple, no carotid bruits. Pulmonary: Clear to auscultation bilaterally   NEUROLOGICAL EXAM:  MMSE - Mini Mental State Exam 11/15/2018 11/14/2017 05/11/2017  Orientation to time 1 3 4   Orientation to Place 5 5 5   Registration 3 3 3   Attention/ Calculation 5 5 5   Recall 0 0 0  Language- name 2 objects 2 2 2   Language- repeat 1 1 1   Language- follow 3 step command 3 3 3   Language- read & follow direction 1 1 1   Write a sentence 1 1 1   Copy design 1 1 1   Total score 23 25 26   Animal naming 7  MENTAL STATUS: Speech:    Speech is normal; fluent and spontaneous with normal comprehension.  Cognition:     Orientation to time, place and person     Normal recent and remote memory     Normal Attention span and concentration     Normal Language, naming, repeating,spontaneous speech     Fund of knowledge   CRANIAL NERVES: CN II: Visual fields are full to confrontation.Pupils are round equal and briskly reactive to light. CN III, IV, VI: extraocular movement are normal. No ptosis. Cover and uncover test showed right inferior/exophoria, left  superior/exophoria. CN V: Facial sensation is intact to pinprick in all  3 divisions bilaterally. Corneal responses are intact.  CN VII: Face is symmetric with normal eye closure and smile. CN VIII: Hard of hearing CN IX, X: Palate elevates symmetrically. Phonation is normal. CN XI: Head turning and shoulder shrug are intact CN XII: Tongue is midline with normal movements and no atrophy.  MOTOR: He has mild bilateral postural tremor, no significant rigidity, bradykinesia, no weakness  REFLEXES: Reflexes are 2+ and symmetric at the biceps, triceps, knees, absent at ankles. Plantar responses are flexor.  SENSORY: Length dependent decreased to light touch, pinprick, vibratory sensation to ankle level, preserved toe proprioception  COORDINATION: Rapid alternating movements and fine finger movements are intact. There is no dysmetria on finger-to-nose and heel-knee-shin.    GAIT/STANCE: Posture is normal. Gait is steady with normal steps, base, arm swing, and turning.  Romberg is absent.   DIAGNOSTIC DATA (LABS, IMAGING, TESTING) - I reviewed patient records, labs, notes, testing and imaging myself where available.   ASSESSMENT AND PLAN  Jeffery Rogers is a 85 y.o. male    Dementia  Consistent with central nervous system degenerative disorder, most likely Alzheimer's disease.  Laboratory evaluation showed no treatable etiology  Continue Namenda 10 mg twice a day, Aricept 10 mg daily  Moderate exercise Essential tremor  Posturing tremor, no evidence of parkinsonian features  Orthostatic dizziness  Improved with hydration  EMG nerve conduction study showed mild axonal peripheral neuropathy, mild elevated A1c 5.8         Marcial Pacas, M.D. Ph.D.  [facility] [address] [place], [place] [address] Ph: [phone] Fax: [phone]  CC: [name], MD

## 2018-11-16 NOTE — Progress Notes (Signed)
Discharge Progress Report  Patient Details  Name: Jeffery Rogers MRN: [id number] Date of Birth: April 19, 1932 Referring Provider:     [place] from 08/08/2018 in [place]  Referring Provider  Val Riles MD       Number of Visits: 36  Reason for Discharge:  Patient reached a stable level of exercise. Patient has met program and personal goals.  Smoking History:  Social History   Tobacco Use  Smoking Status Former Smoker  . Packs/day: 1.00  . Years: 16.00  . Pack years: 16.00  . Types: Cigarettes  Smokeless Tobacco Never Used  Tobacco Comment   Quit smoking cigarettes in 1971    Diagnosis:  05/30/2018 S/P TAVR (transcatheter aortic valve replacement)  ADL [facility]:   Initial Exercise Prescription: Initial Exercise Prescription - 08/08/18 1100      Date of Initial Exercise RX and Referring Provider   Date  08/08/18    Referring Provider  Val Riles MD    Expected Discharge Date  11/13/18      T5 Nustep   Level  1    SPM  75    Minutes  20    METs  1      Track   Laps  4    Minutes  10    METs  0.9      Prescription Details   Frequency (times per week)  3x    Duration  Progress to 30 minutes of continuous aerobic without signs/symptoms of physical distress      Intensity   THRR 40-80% of Max Heartrate  54-108    Ratings of Perceived Exertion  11-13    Perceived Dyspnea  0-4      Progression   Progression  Continue progressive overload as per policy without signs/symptoms or physical distress.      Resistance Training   Training Prescription  Yes    Weight  2lbs    Reps  10-15       Discharge Exercise Prescription (Final Exercise Prescription Changes): Exercise Prescription Changes - 11/10/18 1524      Response to Exercise   Blood Pressure (Admit)  102/68    Blood Pressure (Exercise)  122/70    Blood Pressure (Exit)  112/68    Heart Rate (Admit)  66 bpm    Heart Rate  (Exercise)  109 bpm    Heart Rate (Exit)  62 bpm    Rating of Perceived Exertion (Exercise)  12    Perceived Dyspnea (Exercise)  0    Symptoms  None    Comments  None    Duration  Continue with 30 min of aerobic exercise without signs/symptoms of physical distress.    Intensity  THRR unchanged      Progression   Progression  Continue to progress workloads to maintain intensity without signs/symptoms of physical distress.    Average METs  3.1      Resistance Training   Training Prescription  Yes    Weight  2lbs    Reps  10-15    Time  10 Minutes      Interval Training   Interval Training  No      T5 Nustep   Level  5    SPM  110    Minutes  20    METs  3.1      Track   Laps  12    Minutes  10    METs  3.07      Home Exercise Plan   Plans to continue exercise at  [facility] (comment)   [facility] [employer]   Frequency  Add 3 additional days to program exercise sessions.    Initial Home Exercises Provided  09/08/18       Functional Capacity: 6 Minute Walk    Row Name 08/08/18 1144 11/02/18 1157 11/10/18 0805     6 Minute Walk   Phase  Initial  -  Discharge   Distance  855 feet  1310 feet  -   Distance % Change  -  53.22 %  -   Distance Feet Change  -  455 ft  -   Walk Time  6 minutes  6 minutes  -   # of Rest Breaks  0  0  -   MPH  1.62  2.48  -   METS  0.9  2.21  -   RPE  9  11  -   Perceived Dyspnea   0  0  -   VO2 Peak  3.16  7.7  -   Symptoms  No  No  -   Resting HR  60 bpm  71 bpm  -   Resting BP  120/80  102/66  -   Resting Oxygen Saturation   98 %  -  -   Exercise Oxygen Saturation  during 6 min walk  99 %  -  -   Max Ex. HR  77 bpm  95 bpm  -   Max Ex. BP  104/60  120/68  -   2 Minute Post BP  118/56  118/72  -      Psychological, QOL, Others - Outcomes: PHQ 2/9: No flowsheet data found.  Quality of Life: Quality of Life - 08/08/18 0919      Quality of Life   Select  --   unable to assess due to dementia       Personal Goals: Goals  established at orientation with interventions provided to work toward goal. Personal Goals and Risk Factors at Admission - 08/08/18 1141      Core Components/Risk Factors/Patient Goals on Admission    Weight Management  Yes;Weight Loss    Intervention  Weight Management: Develop a combined nutrition and exercise program designed to reach desired caloric intake, while maintaining appropriate intake of nutrient and fiber, sodium and fats, and appropriate energy expenditure required for the weight goal.;Weight Management: Provide education and appropriate resources to help participant work on and attain dietary goals.;Weight Management/Obesity: Establish reasonable short term and long term weight goals.    Admit Weight  178 lb 5.6 oz (80.9 kg)    Goal Weight: Long Term  170 lb (77.1 kg)    Expected Outcomes  Short Term: Continue to assess and modify interventions until short term weight is achieved;Long Term: Adherence to nutrition and physical activity/exercise program aimed toward attainment of established weight goal;Weight Loss: Understanding of general recommendations for a balanced deficit meal plan, which promotes 1-2 lb weight loss per week and includes a negative energy balance of [phone] kcal/d;Understanding recommendations for meals to include 15-35% energy as protein, 25-35% energy from fat, 35-60% energy from carbohydrates, less than 265m of dietary cholesterol, 20-35 gm of total fiber daily;Understanding of distribution of calorie intake throughout the day with the consumption of 4-5 meals/snacks    Hypertension  Yes    Intervention  Provide education on lifestyle modifcations including regular physical activity/exercise, weight management,  moderate sodium restriction and increased consumption of fresh fruit, vegetables, and low fat dairy, alcohol moderation, and smoking cessation.;Monitor prescription use compliance.    Expected Outcomes  Short Term: Continued assessment and intervention  until BP is < 140/52m HG in hypertensive participants. < 130/874mHG in hypertensive participants with diabetes, heart failure or chronic kidney disease.;Long Term: Maintenance of blood pressure at goal levels.    Lipids  Yes    Intervention  Provide education and support for participant on nutrition & aerobic/resistive exercise along with prescribed medications to achieve LDL '70mg'$ , HDL >'40mg'$ .    Expected Outcomes  Short Term: Participant states understanding of desired cholesterol values and is compliant with medications prescribed. Participant is following exercise prescription and nutrition guidelines.;Long Term: Cholesterol controlled with medications as prescribed, with individualized exercise RX and with personalized nutrition plan. Value goals: LDL < '70mg'$ , HDL > 40 mg.        Personal Goals Discharge: Goals and Risk Factor Review    Row Name 08/14/18 1217 09/14/18 1225 10/11/18 1701 11/09/18 1013 11/16/18 1012     Core Components/Risk Factors/Patient Goals Review   Personal Goals Review  Hypertension;Lipids;Weight Management/Obesity  Hypertension;Lipids;Weight Management/Obesity  Hypertension;Lipids;Weight Management/Obesity  Hypertension;Lipids;Weight Management/Obesity  Hypertension;Lipids;Weight Management/Obesity   Review  Pt with multiple CAD RFs willing to participate in CR exercise. Pt would like to increase his walking distance and comfort with walking.  Pt with multiple CAD RFs willing to participate in CR exercise. DiBarbarann Ehlersas been tolerating exercise with increasing workloads.   Pt with multiple CAD RFs willing to participate in CR exercise. DiBarbarann Ehlersas been tolerating exercise well with increasing workloads.   Pt with multiple CAD RFs willing to participate in CR exercise.  DiBarbarann Ehlersontinues to tolerate increased workloads.  He will graduate 11/13/2018 with 36 complete sessions.  He has done very well.   Dick graduated with 36 completed sessions.  He tolerated exercise well and enjoyed  participating in a group exercise program.    Expected Outcomes  Pt will continue to participate in CR exercise, nutrition, and lifestyle modification opportunities.   Pt will continue to participate in CR exercise, nutrition, and lifestyle modification opportunities.   Pt will continue to participate in CR exercise, nutrition, and lifestyle modification opportunities.   Pt will continue to participate in CR exercise, nutrition, and lifestyle modification opportunities.   Pt will continue to participate in exercise, nutrition, and lifestyle modification opportunities.  His wife plans to enroll him into silver sneakers.      Exercise Goals and Review: Exercise Goals    Row Name 08/08/18 1146             Exercise Goals   Increase Physical Activity  Yes       Intervention  Provide advice, education, support and counseling about physical activity/exercise needs.;Develop an individualized exercise prescription for aerobic and resistive training based on initial evaluation findings, risk stratification, comorbidities and participant's personal goals.       Expected Outcomes  Short Term: Attend rehab on a regular basis to increase amount of physical activity.;Long Term: Add in home exercise to make exercise part of routine and to increase amount of physical activity.;Long Term: Exercising regularly at least 3-5 days a week.       Increase Strength and Stamina  Yes       Intervention  Provide advice, education, support and counseling about physical activity/exercise needs.;Develop an individualized exercise prescription for aerobic and resistive training based on initial evaluation findings, risk stratification, comorbidities  and participant's personal goals.       Expected Outcomes  Short Term: Perform resistance training exercises routinely during rehab and add in resistance training at home;Short Term: Increase workloads from initial exercise prescription for resistance, speed, and METs.;Long Term:  Improve cardiorespiratory fitness, muscular endurance and strength as measured by increased METs and functional capacity (6MWT)       Able to understand and use rate of perceived exertion (RPE) scale  Yes       Intervention  Provide education and explanation on how to use RPE scale       Expected Outcomes  Short Term: Able to use RPE daily in rehab to express subjective intensity level;Long Term:  Able to use RPE to guide intensity level when exercising independently       Knowledge and understanding of Target Heart Rate Range (THRR)  Yes       Intervention  Provide education and explanation of THRR including how the numbers were predicted and where they are located for reference       Expected Outcomes  Short Term: Able to state/look up THRR;Long Term: Able to use THRR to govern intensity when exercising independently;Short Term: Able to use daily as guideline for intensity in rehab       Able to check pulse independently  Yes       Intervention  Provide education and demonstration on how to check pulse in carotid and radial arteries.;Review the importance of being able to check your own pulse for safety during independent exercise       Expected Outcomes  Short Term: Able to explain why pulse checking is important during independent exercise;Long Term: Able to check pulse independently and accurately       Understanding of Exercise Prescription  Yes       Intervention  Provide education, explanation, and written materials on patient's individual exercise prescription       Expected Outcomes  Short Term: Able to explain program exercise prescription;Long Term: Able to explain home exercise prescription to exercise independently          Exercise Goals Re-Evaluation: Exercise Goals Re-Evaluation    Row Name 09/08/18 1615 10/12/18 1432 11/09/18 1151 11/09/18 1157 11/13/18 1515     Exercise Goal Re-Evaluation   Exercise Goals Review  Increase Physical Activity;Able to understand and use rate of  perceived exertion (RPE) scale;Knowledge and understanding of Target Heart Rate Range (THRR);Understanding of Exercise Prescription;Increase Strength and Stamina;Able to check pulse independently  Increase Physical Activity;Understanding of Exercise Prescription  Increase Physical Activity;Understanding of Exercise Prescription  Increase Physical Activity;Understanding of Exercise Prescription  Increase Physical Activity;Understanding of Exercise Prescription   Comments  Reviewed HEP with pt's wife, due to pt having dementia. Spoke with wife about the importance of exercising most days of the week. Spoke with pt's wife about pt using [employer] and possibly using that at [employer].   Pt puts forth great effort with exercise. Pt is continuing to increase cardiovascular strength. Will continue to monitor and progress pt.   -  Pt puts forth great effort with exercise. Pt is continuing to increase cardiovascular strength. Will continue to monitor and progress pt.   Pt completed 36 sessions of rehab. Pt increased functional capacity by 53.22%. Pt increased post increased 6MWT distance by 466f. Pt's goal was to increase walking distance and was able to do that. Pt has dementia and would give family hard time about exercising at home, but loved coming to exercise at rehab.  Expected Outcomes  Family will try and continue to work with pt to try and exercise at home. Family has a hard time getting pt to exercise at home due to resistance from pt. Will continue to help family with pt exercising on his own.   Family is still having trouble getting pt to move at home. Pt will only exercise when he comes to rehab. will continue to try and brainstorm with family on how to help get pt moving more at home.   -  Family is still having trouble getting pt to move at home. Pt will only exercise when he comes to rehab. will continue to try and brainstorm with family on how to help get pt moving more at home.   [facility]. [facility] a [facility] for 30-45 minutes. Pt's family stated pt enjoyed program and even made connections with a volunteer.      Nutrition & Weight - Outcomes: Pre Biometrics - 08/08/18 1146      Pre Biometrics   Height  5' 10"  (1.778 m)    Weight  80.9 kg    Waist Circumference  39 inches    Hip Circumference  41 inches    Waist to Hip Ratio  0.95 %    BMI (Calculated)  25.59    Triceps Skinfold  35 mm    % Body Fat  30.2 %    Grip Strength  38 kg    Flexibility  0 in    Single Leg Stand  0 seconds      Post Biometrics - 11/02/18 1159       Post  Biometrics   Height  5' 10"  (1.778 m)    Weight  80.5 kg    Waist Circumference  38.5 inches    Hip Circumference  41 inches    Waist to Hip Ratio  0.94 %    BMI (Calculated)  25.46    Triceps Skinfold  5 mm    % Body Fat  22 %    Grip Strength  43 kg    Flexibility  0 in    Single Leg Stand  0 seconds       Nutrition: Nutrition Therapy & Goals - 08/08/18 1042      Nutrition Therapy   Diet  general healthful      Personal Nutrition Goals   Nutrition Goal  Pt to identify and limit food sources of saturated fat, trans fat, refined carbohydrates and sodium    Personal Goal #2  Pt to eat complex carbs at breakfast    Personal Goal #3  Pt able to name foods that affect blood glucose      Intervention Plan   Intervention  Prescribe, educate and counsel regarding individualized specific dietary modifications aiming towards targeted core components such as weight, hypertension, lipid management, diabetes, heart failure and other comorbidities.    Expected Outcomes  Short Term Goal: Understand basic principles of dietary content, such as calories, fat, sodium, cholesterol and nutrients.;Long Term Goal: Adherence to prescribed nutrition plan.       Nutrition Discharge: Nutrition Assessments - 11/15/18 1735      MEDFICTS Scores   Pre Score  18    Post Score  --   pt did not return post  survey      Education Questionnaire Score: Knowledge Questionnaire Score - 08/08/18 0919      Knowledge Questionnaire Score  Pre Score  20/24, quiz completed by pt wife        Goals reviewed with patient; copy given to patient.

## 2018-12-13 ENCOUNTER — Ambulatory Visit (INDEPENDENT_AMBULATORY_CARE_PROVIDER_SITE_OTHER): Payer: Medicare Other | Admitting: *Deleted

## 2018-12-13 ENCOUNTER — Other Ambulatory Visit: Payer: Self-pay

## 2018-12-13 DIAGNOSIS — I442 Atrioventricular block, complete: Secondary | ICD-10-CM

## 2018-12-13 DIAGNOSIS — R001 Bradycardia, unspecified: Secondary | ICD-10-CM

## 2018-12-14 LAB — CUP PACEART REMOTE DEVICE CHECK
Date Time Interrogation Session: 20200311042100
Implantable Lead Implant Date: 20170510
Implantable Lead Implant Date: 20170510
Implantable Lead Location: 753859
Implantable Lead Serial Number: 729262
Implantable Pulse Generator Implant Date: 20170510
Lead Channel Pacing Threshold Amplitude: 0.8 V
Lead Channel Pacing Threshold Pulse Width: 0.4 ms
Lead Channel Setting Pacing Amplitude: 1.3 V
MDC IDC LEAD LOCATION: 753860
MDC IDC LEAD SERIAL: 766721
MDC IDC MSMT BATTERY REMAINING LONGEVITY: 156 mo
MDC IDC MSMT BATTERY REMAINING PERCENTAGE: 100 %
MDC IDC MSMT LEADCHNL RA IMPEDANCE VALUE: 766 Ohm
MDC IDC MSMT LEADCHNL RV IMPEDANCE VALUE: 677 Ohm
MDC IDC SET LEADCHNL RV PACING PULSEWIDTH: 0.4 ms
MDC IDC SET LEADCHNL RV SENSING SENSITIVITY: 2.5 mV
MDC IDC STAT BRADY RA PERCENT PACED: 0 %
MDC IDC STAT BRADY RV PERCENT PACED: 100 %
Pulse Gen Serial Number: 748273

## 2018-12-20 NOTE — Progress Notes (Signed)
Remote pacemaker transmission.   

## 2019-02-23 DIAGNOSIS — E7800 Pure hypercholesterolemia, unspecified: Secondary | ICD-10-CM | POA: Diagnosis not present

## 2019-02-23 DIAGNOSIS — Z7982 Long term (current) use of aspirin: Secondary | ICD-10-CM | POA: Diagnosis not present

## 2019-02-27 DIAGNOSIS — I10 Essential (primary) hypertension: Secondary | ICD-10-CM | POA: Diagnosis not present

## 2019-02-27 DIAGNOSIS — N401 Enlarged prostate with lower urinary tract symptoms: Secondary | ICD-10-CM | POA: Diagnosis not present

## 2019-02-27 DIAGNOSIS — R413 Other amnesia: Secondary | ICD-10-CM | POA: Diagnosis not present

## 2019-02-27 DIAGNOSIS — R2689 Other abnormalities of gait and mobility: Secondary | ICD-10-CM | POA: Diagnosis not present

## 2019-02-27 DIAGNOSIS — G629 Polyneuropathy, unspecified: Secondary | ICD-10-CM | POA: Diagnosis not present

## 2019-02-27 DIAGNOSIS — I442 Atrioventricular block, complete: Secondary | ICD-10-CM | POA: Diagnosis not present

## 2019-02-27 DIAGNOSIS — J309 Allergic rhinitis, unspecified: Secondary | ICD-10-CM | POA: Diagnosis not present

## 2019-02-27 DIAGNOSIS — D696 Thrombocytopenia, unspecified: Secondary | ICD-10-CM | POA: Diagnosis not present

## 2019-02-27 DIAGNOSIS — I480 Paroxysmal atrial fibrillation: Secondary | ICD-10-CM | POA: Diagnosis not present

## 2019-02-27 DIAGNOSIS — Z950 Presence of cardiac pacemaker: Secondary | ICD-10-CM | POA: Diagnosis not present

## 2019-02-27 DIAGNOSIS — F411 Generalized anxiety disorder: Secondary | ICD-10-CM | POA: Diagnosis not present

## 2019-02-27 DIAGNOSIS — E7800 Pure hypercholesterolemia, unspecified: Secondary | ICD-10-CM | POA: Diagnosis not present

## 2019-03-01 ENCOUNTER — Telehealth: Payer: Self-pay | Admitting: *Deleted

## 2019-03-01 NOTE — Telephone Encounter (Signed)
Labs received from [facility] (collected on 02/23/2019):  CBC w/ diff: wnl  CMP: wnl  LIPID PANEL: wnl  UA:  [email or web address]

## 2019-03-06 ENCOUNTER — Emergency Department (HOSPITAL_COMMUNITY): Payer: Medicare Other

## 2019-03-06 ENCOUNTER — Ambulatory Visit (INDEPENDENT_AMBULATORY_CARE_PROVIDER_SITE_OTHER): Payer: Medicare Other | Admitting: Family Medicine

## 2019-03-06 ENCOUNTER — Encounter: Payer: Self-pay | Admitting: Family Medicine

## 2019-03-06 ENCOUNTER — Encounter (HOSPITAL_COMMUNITY): Payer: Self-pay

## 2019-03-06 ENCOUNTER — Emergency Department (HOSPITAL_COMMUNITY)
Admission: EM | Admit: 2019-03-06 | Discharge: 2019-03-06 | Disposition: A | Discharge status: Home / Self Care | Payer: Medicare Other | Attending: Emergency Medicine | Admitting: Emergency Medicine

## 2019-03-06 ENCOUNTER — Other Ambulatory Visit: Payer: Self-pay

## 2019-03-06 VITALS — BP 128/70 | HR 78 | Temp 98.4°F | Resp 18 | Ht 70.0 in

## 2019-03-06 DIAGNOSIS — Y92480 Sidewalk as the place of occurrence of the external cause: Secondary | ICD-10-CM | POA: Insufficient documentation

## 2019-03-06 DIAGNOSIS — Z7982 Long term (current) use of aspirin: Secondary | ICD-10-CM | POA: Insufficient documentation

## 2019-03-06 DIAGNOSIS — Y92009 Unspecified place in unspecified non-institutional (private) residence as the place of occurrence of the external cause: Secondary | ICD-10-CM

## 2019-03-06 DIAGNOSIS — Z87891 Personal history of nicotine dependence: Secondary | ICD-10-CM | POA: Diagnosis not present

## 2019-03-06 DIAGNOSIS — W01198A Fall on same level from slipping, tripping and stumbling with subsequent striking against other object, initial encounter: Secondary | ICD-10-CM | POA: Insufficient documentation

## 2019-03-06 DIAGNOSIS — Z79899 Other long term (current) drug therapy: Secondary | ICD-10-CM | POA: Diagnosis not present

## 2019-03-06 DIAGNOSIS — Y9301 Activity, walking, marching and hiking: Secondary | ICD-10-CM | POA: Insufficient documentation

## 2019-03-06 DIAGNOSIS — Z7901 Long term (current) use of anticoagulants: Secondary | ICD-10-CM | POA: Diagnosis not present

## 2019-03-06 DIAGNOSIS — Y999 Unspecified external cause status: Secondary | ICD-10-CM | POA: Insufficient documentation

## 2019-03-06 DIAGNOSIS — W19XXXA Unspecified fall, initial encounter: Secondary | ICD-10-CM | POA: Diagnosis not present

## 2019-03-06 DIAGNOSIS — S0101XA Laceration without foreign body of scalp, initial encounter: Secondary | ICD-10-CM | POA: Diagnosis not present

## 2019-03-06 DIAGNOSIS — S01111A Laceration without foreign body of right eyelid and periocular area, initial encounter: Secondary | ICD-10-CM

## 2019-03-06 DIAGNOSIS — M25511 Pain in right shoulder: Secondary | ICD-10-CM

## 2019-03-06 DIAGNOSIS — S199XXA Unspecified injury of neck, initial encounter: Secondary | ICD-10-CM | POA: Diagnosis not present

## 2019-03-06 DIAGNOSIS — S0990XA Unspecified injury of head, initial encounter: Secondary | ICD-10-CM

## 2019-03-06 NOTE — Patient Instructions (Addendum)
   We do not have Xray or stat labs Please go to [facility] on [address]  Given your fall and your history of stroke and Eliquis you need Urgent Evaluation   If you have lab work done today you will be contacted with your lab results within the next 2 weeks.  If you have not heard from [place] then please contact us. The fastest way to get your results is to register for My Chart.   IF you received an x-ray today, you will receive an invoice from [facility]. Please contact [facility] at [phone] with questions or concerns regarding your invoice.   IF you received labwork today, you will receive an invoice from [place]. Please contact [facility] at [phone] with questions or concerns regarding your invoice.   Our billing staff will not be able to assist you with questions regarding bills from these companies.  You will be contacted with the lab results as soon as they are available. The fastest way to get your results is to activate your My Chart account. Instructions are located on the last page of this paperwork. If you have not heard from [place] regarding the results in 2 weeks, please contact this office.

## 2019-03-06 NOTE — Discharge Instructions (Signed)
Hold your Eliquis tonight but you may be able to take it tomorrow  The glue will fall off in several days.   You may have some dizziness and headaches. Take tylenol for pain   See your doctor  Return to ER if you have worse headaches, dizziness, vomiting.

## 2019-03-06 NOTE — Progress Notes (Signed)
New Patient Office Visit  Subjective:  Patient ID: Jeffery Rogers, male    DOB: 1932/08/28  Age: 85 y.o. MRN: [id number]  CC:  Chief Complaint  Patient presents with  . Hand Injury    pt had a fall today an have a few abrasions on his head,arms, and hands     HPI Jeffery Rogers presents for  This is a 85 year old male who walked into our lobby with visible blood from the right forehead as well as arm who stated that shortly before presentation this morning he fell onto the sidewalk.  He has a history of stroke and is on Plavix though he is no longer taking aspirin.  He denies any dizziness at the time of the fall.  He states that he just stumbled.  He is here with his wife who drove him.  They came in because they thought we were in urgent care and were requesting evaluation for internal bleeding.  Due to the amount of blood from the laceration patient was taken to her room for evaluation and was noted to have laceration to the forehead says are superficial as well as bruising extensive bruising on the right forearm and the back of the hand.  Patient reports pain with lateral abduction and abduction of the arm and states that he fell onto outstretched right arm and hurt his shoulder.  He denies any current headache, vision changes, blurry vision, nausea or vomiting or dizziness.  He is able to ambulate without the assistance of his wife and does not use an assistive device.  He is compliant with his medications.  Past Medical History:  Diagnosis Date  . Arthritis    "right arm/shoulder" (02/10/2016)  . Complete heart block ([place]) 02/10/2016   a. s/p Emergency planning/management officer  . Goiter   . Hypercholesteremia   . Memory loss   . Nasal septal deviation   . Persistent atrial fibrillation   . Presence of permanent cardiac pacemaker   . S/P TAVR (transcatheter aortic valve replacement) 05/30/2018   23 mm Edwards Sapien 3 transcatheter heart valve placed via percutaneous right transfemoral  approach   . Severe aortic stenosis   . Tremor     Past Surgical History:  Procedure Laterality Date  . CATARACT EXTRACTION W/ INTRAOCULAR LENS  IMPLANT, BILATERAL Bilateral ~ 2000  . EP IMPLANTABLE DEVICE N/A 02/11/2016   Procedure: Pacemaker Implant;  Surgeon: Rietz Lance, MD;  Location: [facility] [facility];  Service: Cardiovascular;  Laterality: N/A;  . RIGHT/LEFT HEART CATH AND CORONARY ANGIOGRAPHY N/A 05/03/2018   Procedure: RIGHT/LEFT HEART CATH AND CORONARY ANGIOGRAPHY;  Surgeon: Burnell Blanks, MD;  Location: [facility];  Service: Cardiovascular;  Laterality: N/A;  . TEE WITHOUT CARDIOVERSION N/A 05/30/2018   Procedure: TRANSESOPHAGEAL ECHOCARDIOGRAM (TEE);  Surgeon: Burnell Blanks, MD;  Location: [place];  Service: Open Heart Surgery;  Laterality: N/A;  . TONSILLECTOMY  1930s  . TRANSCATHETER AORTIC VALVE REPLACEMENT, TRANSFEMORAL N/A 05/30/2018   Procedure: TRANSCATHETER AORTIC VALVE REPLACEMENT, TRANSFEMORAL using a 70mm Edwards Sapien 3 Aortic Valve;  Surgeon: Burnell Blanks, MD;  Location: [facility];  Service: Open Heart Surgery;  Laterality: N/A;  . WISDOM TOOTH EXTRACTION  1990s    Family History  Problem Relation Age of Onset  . Stroke Other   . Irregular heart beat Other   . Stroke Mother   . Stroke Father   . CAD Father        MI  Social History   Socioeconomic History  . Marital status: Married    Spouse name: Not on file  . Number of children: 2  . Years of education: Bachelors  . Highest education level: Not on file  Occupational History  . Occupation: Retired Glass blower/designer  . Financial resource strain: Not on file  . Food insecurity:    Worry: Not on file    Inability: Not on file  . Transportation needs:    Medical: Not on file    Non-medical: Not on file  Tobacco Use  . Smoking status: Former Smoker    Packs/day: 1.00    Years: 16.00    Pack years: 16.00    Types: Cigarettes  . Smokeless tobacco: Never  Used  . Tobacco comment: Quit smoking cigarettes in 1971  Substance and Sexual Activity  . Alcohol use: Never    Alcohol/week: 2.0 standard drinks    Types: 1 Cans of beer, 1 Shots of liquor per week    Frequency: Never  . Drug use: No  . Sexual activity: Yes  Lifestyle  . Physical activity:    Days per week: Not on file    Minutes per session: Not on file  . Stress: Not on file  Relationships  . Social connections:    Talks on phone: Not on file    Gets together: Not on file    Attends religious service: Not on file    Active member of club or organization: Not on file    Attends meetings of clubs or organizations: Not on file    Relationship status: Not on file  . Intimate partner violence:    Fear of current or ex partner: Not on file    Emotionally abused: Not on file    Physically abused: Not on file    Forced sexual activity: Not on file  Other Topics Concern  . Not on file  Social History Narrative   Lives at home with his wife.   Right-handed.   1 cup coffee per day.  Occasional Coke.    ROS Review of Systems  Objective:   Today's Vitals: BP 128/70   Pulse 78   Temp 98.4 F (36.9 C) (Oral)   Resp 18   Ht 5\' 10"  (1.778 m)   SpO2 95%   BMI 25.83 kg/m   Physical Exam  Physical Exam  Constitutional: Oriented to person, place, and time. Appears well-developed and well-nourished.  HENT:  Head: Normocephalic.  Laceration superficial of the forehead right above the eyebrow and to the lateral aspect of the orbit.  No palpable hematoma at this point or palpable mass. Eyes: Conjunctivae and EOM are normal.  Pulses equal and reactive. Cardiovascular: IRRegular rate and rhythm. Pulmonary/Chest: Effort normal and breath sounds normal. No stridor. No respiratory distress. Has no wheezes.  Musculoskeletal: Normal range of motion, tender to palpation over the [facility] joint on the right Neurological: Is alert and oriented to person, place, and time.  Skin: Skin is warm.  Capillary refill takes less than 2 seconds.  Psychiatric: Has a normal mood and affect. Behavior is normal. Judgment and thought content normal.   Assessment & Plan:   Problem List Items Addressed This Visit    None    Visit Diagnoses    Eyelid skin and periocular area laceration, right, initial encounter    -  Primary   Fall in home, initial encounter       Acute pain of right shoulder  Patient has a history of stroke on Eliquis as well as other issues such as atrial fibrillation who is currently appearing hemodynamically stable with normal vitals however we do not have x-ray on site or certain stat labs thus patient was advised to go to [facility] urgent care for further evaluation. Superficial laceration was repaired using Dermabond after cleaning the area with Betadine.  Evaluation of the eyes was limited as the triage space did not have capability of becoming dark.  However patient's eyes and ocular movements are normal and intact thus patient was sent by private vehicle.  They verbalized understanding.  Outpatient Encounter Medications as of 03/06/2019  Medication Sig  . amoxicillin (AMOXIL) 500 MG tablet Take 4 tablets (2,000 mg) one hour prior to dental visits.  Marland Kitchen apixaban (ELIQUIS) 5 MG TABS tablet Take 1 tablet (5 mg total) by mouth 2 (two) times daily.  Marland Kitchen aspirin EC 81 MG tablet Take 81 mg by mouth daily.  Marland Kitchen atorvastatin (LIPITOR) 40 MG tablet Take 1 tablet (40 mg total) by mouth daily.  . Cholecalciferol (VITAMIN D-3) 1000 units CAPS Take 1,000 Units by mouth daily.   Marland Kitchen donepezil (ARICEPT) 10 MG tablet Take 1 tablet (10 mg total) by mouth at bedtime.  Marland Kitchen ipratropium (ATROVENT) 0.06 % nasal spray Place 1-2 sprays into both nostrils 4 (four) times daily as needed for rhinitis.  Marland Kitchen L-Methylfolate-Algae-B12-B6 (METANX) 3-90.314-2-35 MG CAPS Take 1 capsule by mouth 2 (two) times daily.  . memantine (NAMENDA) 10 MG tablet Take 1 tablet (10 mg total) by mouth 2 (two) times daily.   . QUEtiapine (SEROQUEL) 25 MG tablet Take 1 tablet (25 mg total) by mouth at bedtime.  . sertraline (ZOLOFT) 50 MG tablet Take 50 mg by mouth daily.   . vitamin C (ASCORBIC ACID) 500 MG tablet Take 500 mg by mouth daily.  Marland Kitchen amLODipine (NORVASC) 5 MG tablet Take 1 tablet (5 mg total) by mouth daily.   No facility-administered encounter medications on file as of 03/06/2019.     Follow-up: No follow-ups on file.   Forrest Moron, MD

## 2019-03-06 NOTE — ED Triage Notes (Signed)
Patient was seen at urgent care and wound cared for there.  Glue to laceration above left eye and forehead.  Referred her for head ct and eval

## 2019-03-06 NOTE — ED Provider Notes (Signed)
[facility] EMERGENCY DEPARTMENT Provider Note   CSN: [id number] Arrival date & time: 03/06/19  1236    History   Chief Complaint Chief Complaint  Patient presents with   Fall    HPI Jeffery Rogers is a 85 y.o. male history of high cholesterol, A. Fib on Eliquis here presenting with fall.  Patient states that he had a mechanical fall and tripped over something while on the curb and hit his head.  He denies passing out or chest pain or shortness of breath.  Patient went to urgent care and was noted to have a right scalp laceration and dermabond was applied. Patient was sent in for CT head since he is on eliquis.  Denies any weakness or numbness or trouble speaking. Tdap is up to date.      The history is provided by the patient.    Past Medical History:  Diagnosis Date   Arthritis    "right arm/shoulder" (02/10/2016)   Complete heart block ([place]) 02/10/2016   a. s/p [employer] Pacemaker   Goiter    Hypercholesteremia    Memory loss    Nasal septal deviation    Persistent atrial fibrillation    Presence of permanent cardiac pacemaker    S/P TAVR (transcatheter aortic valve replacement) 05/30/2018   23 mm Edwards Sapien 3 transcatheter heart valve placed via percutaneous right transfemoral approach    Severe aortic stenosis    Tremor     Patient Active Problem List   Diagnosis Date Noted   Noise effect on both inner ears 10/06/2018   Memory loss    Presence of permanent cardiac pacemaker    Persistent atrial fibrillation    S/P TAVR (transcatheter aortic valve replacement) 05/30/2018   Severe aortic stenosis    Tendinopathy of left rotator cuff 12/19/2017   Bilateral impacted cerumen 11/25/2017   Presbycusis of both ears 11/25/2017   Vasomotor rhinitis 11/25/2017   Dizziness 05/15/2017   Chronic right maxillary sinusitis 03/30/2017   Rhinitis 03/30/2017   Elevated CPK 02/18/2017   Sinusitis 02/18/2017   Peripheral  neuropathy 02/18/2017   Chronic maxillary sinusitis 02/18/2017   Mild cognitive impairment 01/12/2017   Essential tremor 01/12/2017   Orthostatic dizziness 01/12/2017   Diplopia 01/12/2017   First degree atrioventricular block 03/29/2013   Hypercholesteremia    Goiter    Nasal septal deviation    Snoring     Past Surgical History:  Procedure Laterality Date   CATARACT EXTRACTION W/ INTRAOCULAR LENS  IMPLANT, BILATERAL Bilateral ~ 2000   EP IMPLANTABLE DEVICE N/A 02/11/2016   Procedure: Pacemaker Implant;  Surgeon: Stach Lance, MD;  Location: [place] [facility];  Service: Cardiovascular;  Laterality: N/A;   RIGHT/LEFT HEART CATH AND CORONARY ANGIOGRAPHY N/A 05/03/2018   Procedure: RIGHT/LEFT HEART CATH AND CORONARY ANGIOGRAPHY;  Surgeon: Burnell Blanks, MD;  Location: [place] [facility];  Service: Cardiovascular;  Laterality: N/A;   TEE WITHOUT CARDIOVERSION N/A 05/30/2018   Procedure: TRANSESOPHAGEAL ECHOCARDIOGRAM (TEE);  Surgeon: Burnell Blanks, MD;  Location: [place];  Service: Open Heart Surgery;  Laterality: N/A;   TONSILLECTOMY  1930s   TRANSCATHETER AORTIC VALVE REPLACEMENT, TRANSFEMORAL N/A 05/30/2018   Procedure: TRANSCATHETER AORTIC VALVE REPLACEMENT, TRANSFEMORAL using a 80mm Edwards Sapien 3 Aortic Valve;  Surgeon: Burnell Blanks, MD;  Location: [place];  Service: Open Heart Surgery;  Laterality: N/A;   [place] Medications  Prior to Admission medications   Medication Sig Start Date End Date Taking? Authorizing Provider  amLODipine (NORVASC) 5 MG tablet Take 1 tablet (5 mg total) by mouth daily. 08/10/18 11/16/18  Eileen Stanford, PA-C  amoxicillin (AMOXIL) 500 MG tablet Take 4 tablets (2,000 mg) one hour prior to dental visits. 06/08/18   Eileen Stanford, PA-C  apixaban (ELIQUIS) 5 MG TABS tablet Take 1 tablet (5 mg total) by mouth 2 (two) times daily. 02/08/17   [place] Lance, MD    aspirin EC 81 MG tablet Take 81 mg by mouth daily.    [provider]  atorvastatin (LIPITOR) 40 MG tablet Take 1 tablet (40 mg total) by mouth daily. 05/24/17   Prisk Lance, MD  Cholecalciferol (VITAMIN D-3) 1000 units CAPS Take 1,000 Units by mouth daily.     [provider]  donepezil (ARICEPT) 10 MG tablet Take 1 tablet (10 mg total) by mouth at bedtime. 05/15/18   Marcial Pacas, MD  ipratropium (ATROVENT) 0.06 % nasal spray Place 1-2 sprays into both nostrils 4 (four) times daily as needed for rhinitis.    [provider]  L-Methylfolate-Algae-B12-B6 Glade Stanford) 3-90.314-2-35 MG CAPS Take 1 capsule by mouth 2 (two) times daily. 05/15/18   Marcial Pacas, MD  memantine (NAMENDA) 10 MG tablet Take 1 tablet (10 mg total) by mouth 2 (two) times daily. 05/15/18   Marcial Pacas, MD  QUEtiapine (SEROQUEL) 25 MG tablet Take 1 tablet (25 mg total) by mouth at bedtime. 10/31/18   Marcial Pacas, MD  sertraline (ZOLOFT) 50 MG tablet Take 50 mg by mouth daily.     [provider]  vitamin C (ASCORBIC ACID) 500 MG tablet Take 500 mg by mouth daily.    [provider]    Family History Family History  Problem Relation Age of Onset   Stroke Other    Irregular heart beat Other    Stroke Mother    Stroke Father    CAD Father        MI    Social History Social History   Tobacco Use   Smoking status: Former Smoker    Packs/day: 1.00    Years: 16.00    Pack years: 16.00    Types: Cigarettes   Smokeless tobacco: Never Used   Tobacco comment: Quit smoking cigarettes in 1971  Substance Use Topics   Alcohol use: Never    Alcohol/week: 2.0 standard drinks    Types: 1 Cans of beer, 1 Shots of liquor per week    Frequency: Never   Drug use: No     Allergies   Patient has no known allergies.   Review of Systems Review of Systems  Skin: Positive for wound.  All other systems reviewed and are negative.    Physical Exam Updated Vital Signs BP  125/70    Pulse 60    Temp 98.5 F (36.9 C) (Oral)    Resp 20    SpO2 97%   Physical Exam Vitals signs and nursing note reviewed.  HENT:     Head: Normocephalic.     Comments: 1 cm R scalp laceration that is dermbonded already and remained closed     Mouth/Throat:     Mouth: Mucous membranes are moist.  Eyes:     Extraocular Movements: Extraocular movements intact.     Conjunctiva/sclera: Conjunctivae normal.     Pupils: Pupils are equal, round, and reactive to light.  Neck:     Musculoskeletal: Normal  range of motion and neck supple.  Cardiovascular:     Rate and Rhythm: Normal rate and regular rhythm.  Pulmonary:     Effort: Pulmonary effort is normal.     Breath sounds: Normal breath sounds.  Abdominal:     General: Abdomen is flat.     Palpations: Abdomen is soft.  Musculoskeletal: Normal range of motion.     Comments: Abrasion R knee, nl ROM.   Skin:    General: Skin is warm.  Neurological:     General: No focal deficit present.     Mental Status: He is alert.  Psychiatric:        Mood and Affect: Mood normal.      ED Treatments / Results  Labs (all labs ordered are listed, but only abnormal results are displayed) Labs Reviewed - No data to display  EKG None  Radiology Ct Head Wo Contrast  Result Date: 03/06/2019 CLINICAL DATA:  Head trauma after fall.  No loss of consciousness. EXAM: CT HEAD WITHOUT CONTRAST CT CERVICAL SPINE WITHOUT CONTRAST TECHNIQUE: Multidetector CT imaging of the head and cervical spine was performed following the standard protocol without intravenous contrast. Multiplanar CT image reconstructions of the cervical spine were also generated. COMPARISON:  CT scan of January 14, 2017. FINDINGS: CT HEAD FINDINGS Brain: Mild diffuse cortical atrophy is noted. Mild chronic ischemic white matter disease is noted. No mass effect or midline shift is noted. Ventricular size is within normal limits. There is no evidence of mass lesion, hemorrhage or acute  infarction. Vascular: No hyperdense vessel or unexpected calcification. Skull: Normal. Negative for fracture or focal lesion. Sinuses/Orbits: Right maxillary sinusitis is noted. Other: None. CT CERVICAL SPINE FINDINGS Alignment: Normal. Skull base and vertebrae: No acute fracture. No primary bone lesion or focal pathologic process. Soft tissues and spinal canal: No prevertebral fluid or swelling. No visible canal hematoma. Disc levels: Severe degenerative disc disease is noted at C4-5, C5-6 and C6-7. Upper chest: Negative. Other: Degenerative changes are seen involving posterior facet joints bilaterally. IMPRESSION: Mild diffuse cortical atrophy. Mild chronic ischemic white matter disease. No acute intracranial abnormality seen. Severe multilevel degenerative disc disease. No acute abnormality seen in the cervical spine. Electronically Signed   By: Marijo Conception M.D.   On: 03/06/2019 14:20   Ct Cervical Spine Wo Contrast  Result Date: 03/06/2019 CLINICAL DATA:  Head trauma after fall.  No loss of consciousness. EXAM: CT HEAD WITHOUT CONTRAST CT CERVICAL SPINE WITHOUT CONTRAST TECHNIQUE: Multidetector CT imaging of the head and cervical spine was performed following the standard protocol without intravenous contrast. Multiplanar CT image reconstructions of the cervical spine were also generated. COMPARISON:  CT scan of January 14, 2017. FINDINGS: CT HEAD FINDINGS Brain: Mild diffuse cortical atrophy is noted. Mild chronic ischemic white matter disease is noted. No mass effect or midline shift is noted. Ventricular size is within normal limits. There is no evidence of mass lesion, hemorrhage or acute infarction. Vascular: No hyperdense vessel or unexpected calcification. Skull: Normal. Negative for fracture or focal lesion. Sinuses/Orbits: Right maxillary sinusitis is noted. Other: None. CT CERVICAL SPINE FINDINGS Alignment: Normal. Skull base and vertebrae: No acute fracture. No primary bone lesion or focal  pathologic process. Soft tissues and spinal canal: No prevertebral fluid or swelling. No visible canal hematoma. Disc levels: Severe degenerative disc disease is noted at C4-5, C5-6 and C6-7. Upper chest: Negative. Other: Degenerative changes are seen involving posterior facet joints bilaterally. IMPRESSION: Mild diffuse cortical atrophy. Mild chronic  ischemic white matter disease. No acute intracranial abnormality seen. Severe multilevel degenerative disc disease. No acute abnormality seen in the cervical spine. Electronically Signed   By: Marijo Conception M.D.   On: 03/06/2019 14:20    Procedures Procedures (including critical care time)  Medications Ordered in ED Medications - No data to display   Initial Impression / Assessment and Plan / ED Course  I have reviewed the triage vital signs and the nursing notes.  Pertinent labs & imaging results that were available during my care of the patient were reviewed by me and considered in my medical decision making (see chart for details).       Jeffery Rogers is a 85 y.o. male here with fall. Had R scalp laceration that was dermbonded at urgent care. Since he is on eliquis, will get CT head. Patient has normal neuro exam currently.   2:25 PM CT head/neck unremarkable. Stable for discharge. Gave head injury instructions.   Final Clinical Impressions(s) / ED Diagnoses   Final diagnoses:  None    ED Discharge Orders    None       Drenda Freeze, MD 03/06/19 1425

## 2019-03-07 ENCOUNTER — Ambulatory Visit (INDEPENDENT_AMBULATORY_CARE_PROVIDER_SITE_OTHER): Payer: Medicare Other | Admitting: Neurology

## 2019-03-07 ENCOUNTER — Encounter: Payer: Self-pay | Admitting: Neurology

## 2019-03-07 VITALS — BP 124/69 | HR 71 | Temp 97.3°F | Ht 70.0 in | Wt 179.5 lb

## 2019-03-07 DIAGNOSIS — W19XXXS Unspecified fall, sequela: Secondary | ICD-10-CM | POA: Diagnosis not present

## 2019-03-07 DIAGNOSIS — R413 Other amnesia: Secondary | ICD-10-CM

## 2019-03-07 DIAGNOSIS — W19XXXA Unspecified fall, initial encounter: Secondary | ICD-10-CM | POA: Insufficient documentation

## 2019-03-07 DIAGNOSIS — G6289 Other specified polyneuropathies: Secondary | ICD-10-CM | POA: Diagnosis not present

## 2019-03-07 NOTE — Progress Notes (Signed)
PATIENT: Jeffery Rogers DOB: [birth year]  Chief Complaint  Patient presents with  . Gait Problem    He is here with his wife, Jeffery Rogers, to be evaluated for increased falls.     HISTORICAL  Jeffery Rogers is 85 years old right-handed male, accompanied by [name] and [name] for evaluation of memory loss, tremor, dizziness, he is referred by his primary care physician [name], initial evaluation was on January 14 2017.  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  There was no significant family history of memory loss  Family history of bilateral hands tremor, gradually getting worse, no gait abnormality, his daughter also suffered bilateral hands tremor,  Dizziness since January 2018, no dizziness, lightheadedness in the sitting down or lying down position, most noticeable when he get up quickly from seated position, overnight sleeping, he has transient lightheadedness, he denies significant low back pain, no gait abnormality, no lower extremity paresthesia notice, but on today's examination was noted to have mild length dependent sensory changes, suggestive of peripheral neuropathy  He also complains of new onset intermittent binocular double vision during his vestibular rehabilitation only  UPDATE May 11 2017: Laboratory evaluations showed mild elevated A1c 5.8, otherwise negative or normal RPR, B12, TSH, CBC, acetylcholine receptor antibody, ESR C-reactive protein, positive ANA, elevated CPK 546   Electrodiagnostic study today showed length dependent peripheral neuropathy, in addition, there is evidence of mild chronic neuropathic changes involving bilateral L4 nerve roots. He has intermittent low back pain, but denies low back pain now, denies significant gait abnormality.  The also personally reviewed CT head without contrast, generalized atrophy, mild supratentorium small vessel disease.  He continues to complain short term memroy loss, wife reported agitation occasionally.   UPDATE Nov 14 2017: He is accompanied by his family at today's clinic visit, he is overall doing very well, status 25 out of 30, continue to exercise regularly, mild increased bilateral hands tremor.  UPDATE Nov 15 2018: He had severe aortic stenosis, had transcatheter aortic valve replacement on May 30, 2018, recovered very well, on Eliquis treatment  He continue have slow decline memory loss, Mini-Mental Status Examination 23 out of 30 today, taking Aricept, Namenda, Seroquel 25 mg every night, sleep 10 to 12 hours every night  UPDATE June 4th 2020: He is accompanied by his wife at visit, he had fall on June 2nd 2020, he went out for a walk around 10:15 AM, without warning signs, his lower extremity give out underneath him, he denied loss of consciousness, had a bruise at his forehead, knees, his wife was able to get a phone call from his neighbor, pick him up about 5 minutes later, he was at his baseline,  He was checked at emergency room, I personally reviewed CT head, mild diffuse cortical atrophy, mild chronic ischemic white matter disease, no acute intracranial abnormality  CT of cervical spine showed multilevel degenerative disc disease, no acute abnormality  He fell few other times in the past, due to misjudgment of his steps,  REVIEW OF SYSTEMS: Full 14 system review of systems performed and notable only for as above  All rest review of the system were negative ALLERGIES: No Known Allergies   HOME MEDICATIONS: Current Outpatient Medications  Medication Sig Dispense Refill  . amoxicillin (AMOXIL) 500 MG tablet Take 4 tablets (2,000 mg) one hour prior to dental visits. 8 tablet 6  . apixaban (ELIQUIS) 5 MG TABS tablet Take 1 tablet (5 mg total) by mouth 2 (two) times daily. 180 tablet 3  . atorvastatin (LIPITOR) 40 MG tablet Take 1 tablet (40 mg total) by mouth daily. 90 tablet 2  . Cholecalciferol (VITAMIN D-3) 1000 units CAPS Take 1,000 Units by mouth daily.     Marland Kitchen donepezil (ARICEPT) 10 MG tablet Take 1 tablet (10 mg total) by mouth at bedtime. 90 tablet 4  . ipratropium (ATROVENT) 0.06 % nasal spray Place 1-2 sprays into both nostrils 4 (four) times daily as needed for rhinitis.    Marland Kitchen L-Methylfolate-Algae-B12-B6 (METANX) 3-90.314-2-35 MG CAPS Take 1 capsule by mouth 2 (two) times daily. 180 capsule 4  . memantine (NAMENDA) 10 MG tablet Take 1 tablet (10 mg total) by mouth 2 (two) times daily. 180 tablet 4  . QUEtiapine (SEROQUEL) 25 MG tablet Take 1 tablet (25 mg total) by mouth at bedtime. 90 tablet 3  . sertraline (ZOLOFT) 50 MG tablet Take 50 mg by mouth daily.     . vitamin C (ASCORBIC ACID) 500 MG tablet Take 500 mg by mouth daily.    Marland Kitchen amLODipine (NORVASC) 5 MG tablet Take 1 tablet (5 mg total) by mouth daily. 90 tablet 3   No current facility-administered medications for this visit.     PAST MEDICAL HISTORY: Past Medical History:  Diagnosis Date  . Arthritis    "right arm/shoulder" (02/10/2016)  . Complete heart block ([place]) 02/10/2016   a. s/p Emergency planning/management officer  . Goiter   . Hypercholesteremia   . Memory loss   . Nasal septal deviation   . Persistent atrial fibrillation   . Presence of permanent cardiac pacemaker   . 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 Severe aortic stenosis   . Tremor     PAST SURGICAL HISTORY: Past Surgical  History:  Procedure Laterality Date  . CATARACT EXTRACTION W/ INTRAOCULAR LENS  IMPLANT, BILATERAL Bilateral ~ 2000  . EP IMPLANTABLE DEVICE N/A 02/11/2016   Procedure: Pacemaker Implant;  Surgeon: Rochin Lance, MD;  Location: [facility];  Service: Cardiovascular;  Laterality: N/A;  . RIGHT/LEFT HEART CATH AND CORONARY ANGIOGRAPHY N/A 05/03/2018   Procedure: RIGHT/LEFT HEART CATH AND CORONARY ANGIOGRAPHY;  Surgeon: Burnell Blanks, MD;  Location: [facility];  Service: Cardiovascular;  Laterality: N/A;  . TEE WITHOUT CARDIOVERSION N/A 05/30/2018   Procedure: TRANSESOPHAGEAL ECHOCARDIOGRAM (TEE);  Surgeon: Burnell Blanks, MD;  Location: [facility];  Service: Open Heart Surgery;  Laterality: N/A;  . TONSILLECTOMY  1930s  . TRANSCATHETER AORTIC VALVE REPLACEMENT, TRANSFEMORAL N/A 05/30/2018   Procedure: TRANSCATHETER AORTIC VALVE REPLACEMENT, TRANSFEMORAL using a 27m Edwards Sapien 3 Aortic Valve;  Surgeon: MBurnell Blanks MD;  Location: [facility]  Service: Open Heart Surgery;  Laterality: N/A;  . WISDOM TOOTH EXTRACTION  1990s    FAMILY HISTORY: Family History  Problem Relation Age of Onset  . Stroke Other   . Irregular heart beat Other   . Stroke  Mother   . Stroke Father   . CAD Father        MI    SOCIAL HISTORY:  Social History   Socioeconomic History  . Marital status: Married    Spouse name: Not on file  . Number of children: 2  . Years of education: Bachelors  . Highest education level: Not on file  Occupational History  . Occupation: Retired Glass blower/designer  . Financial resource strain: Not on file  . Food insecurity:    Worry: Not on file    Inability: Not on file  . Transportation needs:    Medical: Not on file    Non-medical: Not on file  Tobacco Use  . Smoking status: Former Smoker    Packs/day: 1.00    Years: 16.00    Pack years: 16.00    Types: Cigarettes  . Smokeless tobacco: Never Used  . Tobacco comment: Quit  smoking cigarettes in 1971  Substance and Sexual Activity  . Alcohol use: Never    Alcohol/week: 2.0 standard drinks    Types: 1 Cans of beer, 1 Shots of liquor per week    Frequency: Never  . Drug use: No  . Sexual activity: Yes  Lifestyle  . Physical activity:    Days per week: Not on file    Minutes per session: Not on file  . Stress: Not on file  Relationships  . Social connections:    Talks on phone: Not on file    Gets together: Not on file    Attends religious service: Not on file    Active member of club or organization: Not on file    Attends meetings of clubs or organizations: Not on file    Relationship status: Not on file  . Intimate partner violence:    Fear of current or ex partner: Not on file    Emotionally abused: Not on file    Physically abused: Not on file    Forced sexual activity: Not on file  Other Topics Concern  . Not on file  Social History Narrative   Lives at home with his wife.   Right-handed.   1 cup coffee per day.  Occasional Coke.     PHYSICAL EXAM   Vitals:   03/07/19 1138  BP: 124/69  Pulse: 71  Temp: (!) 97.3 F (36.3 C)  Weight: 179 lb 8 oz (81.4 kg)  Height: 5' 10"  (1.778 m)    Not recorded      Body mass index is 25.76 kg/m.  PHYSICAL EXAMNIATION:  Gen: NAD, conversant, well nourised, obese, well groomed                     Cardiovascular: Regular rate rhythm, no peripheral edema, warm, nontender. Eyes: Conjunctivae clear without exudates or hemorrhage Neck: Supple, no carotid bruits. Pulmonary: Clear to auscultation bilaterally   NEUROLOGICAL EXAM:  MMSE - Mini Mental State Exam 11/15/2018 11/14/2017 05/11/2017  Orientation to time 1 3 4   Orientation to Place 5 5 5   Registration 3 3 3   Attention/ Calculation 5 5 5   Recall 0 0 0  Language- name 2 objects 2 2 2   Language- repeat 1 1 1   Language- follow 3 step command 3 3 3   Language- read & follow direction 1 1 1   Write a sentence 1 1 1   Copy design 1 1 1    Total score 23 25 26   Animal naming 7  MENTAL STATUS: Speech:  Speech is normal; fluent and spontaneous with normal comprehension.  Cognition:     Orientation to time, place and person     Normal recent and remote memory     Normal Attention span and concentration     Normal Language, naming, repeating,spontaneous speech     Fund of knowledge   CRANIAL NERVES: CN II: Visual fields are full to confrontation.Pupils are round equal and briskly reactive to light. CN III, IV, VI: extraocular movement are normal. No ptosis. Cover and uncover test showed right inferior/exophoria, left superior/exophoria. CN V: Facial sensation is intact to pinprick in all 3 divisions bilaterally. Corneal responses are intact.  CN VII: Face is symmetric with normal eye closure and smile. CN VIII: Hard of hearing CN IX, X: Palate elevates symmetrically. Phonation is normal. CN XI: Head turning and shoulder shrug are intact CN XII: Tongue is midline with normal movements and no atrophy.  MOTOR: He has mild bilateral postural tremor, no significant rigidity, bradykinesia, no weakness  REFLEXES: Reflexes are 1 and symmetric at the biceps, triceps, knees, absent at ankles. Plantar responses are flexor.  SENSORY: Length dependent decreased to light touch, pinprick, vibratory sensation to ankle level, preserved toe proprioception  COORDINATION: Rapid alternating movements and fine finger movements are intact. There is no dysmetria on finger-to-nose and heel-knee-shin.    GAIT/STANCE: He needs to push up to get up from seated position, normal posturing, steady gait Romberg is absent.   DIAGNOSTIC DATA (LABS, IMAGING, TESTING) - I reviewed patient records, labs, notes, testing and imaging myself where available.   ASSESSMENT AND PLAN  CUTTER PASSEY is a 85 y.o. male   [place] on March 06 2019:  No acute abnormalities on CT head and cervical spine,   No new neurologic event noted, likely due to  misjudgement of his steps   Dementia  Consistent with central nervous system degenerative disorder, most likely Alzheimer's disease.  Laboratory evaluation showed no treatable etiology  Continue Namenda 10 mg twice a day, Aricept 10 mg daily  Continue Seroquel 25 mg every night  Moderate exercise Due to his Essential tremor  Posturing tremor, no evidence of parkinsonian features  Orthostatic dizziness  Improved with hydration  EMG nerve conduction study showed mild axonal peripheral neuropathy, mild elevated A1c 5.8         Marcial Pacas, M.D. Ph.D.  [facility] [address] [place], [place] [address] Ph: [phone] Fax: [phone]  CC: [name], MD

## 2019-03-08 DIAGNOSIS — S0011XA Contusion of right eyelid and periocular area, initial encounter: Secondary | ICD-10-CM | POA: Diagnosis not present

## 2019-03-08 DIAGNOSIS — Z961 Presence of intraocular lens: Secondary | ICD-10-CM | POA: Diagnosis not present

## 2019-03-08 DIAGNOSIS — D3132 Benign neoplasm of left choroid: Secondary | ICD-10-CM | POA: Diagnosis not present

## 2019-03-08 DIAGNOSIS — H35033 Hypertensive retinopathy, bilateral: Secondary | ICD-10-CM | POA: Diagnosis not present

## 2019-03-14 ENCOUNTER — Ambulatory Visit (INDEPENDENT_AMBULATORY_CARE_PROVIDER_SITE_OTHER): Payer: Medicare Other | Admitting: *Deleted

## 2019-03-14 DIAGNOSIS — I442 Atrioventricular block, complete: Secondary | ICD-10-CM

## 2019-03-14 LAB — CUP PACEART REMOTE DEVICE CHECK
Battery Remaining Longevity: 150 mo
Battery Remaining Percentage: 100 %
Brady Statistic RA Percent Paced: 0 %
Brady Statistic RV Percent Paced: 100 %
Date Time Interrogation Session: 20200610042100
Implantable Lead Implant Date: 20170510
Implantable Lead Implant Date: 20170510
Implantable Lead Location: 753859
Implantable Lead Location: 753860
Implantable Lead Model: 7741
Implantable Lead Model: 7742
Implantable Lead Serial Number: 729262
Implantable Lead Serial Number: 766721
Implantable Pulse Generator Implant Date: 20170510
Lead Channel Impedance Value: 655 Ohm
Lead Channel Impedance Value: 761 Ohm
Lead Channel Pacing Threshold Amplitude: 0.9 V
Lead Channel Pacing Threshold Pulse Width: 0.4 ms
Lead Channel Setting Pacing Amplitude: 1.4 V
Lead Channel Setting Pacing Pulse Width: 0.4 ms
Lead Channel Setting Sensing Sensitivity: 2.5 mV
Pulse Gen Serial Number: 748273

## 2019-03-23 ENCOUNTER — Encounter: Payer: Self-pay | Admitting: Cardiology

## 2019-03-23 NOTE — Progress Notes (Signed)
Remote pacemaker transmission.   

## 2019-03-30 DIAGNOSIS — H6121 Impacted cerumen, right ear: Secondary | ICD-10-CM | POA: Diagnosis not present

## 2019-04-14 ENCOUNTER — Emergency Department (HOSPITAL_COMMUNITY)
Admission: EM | Admit: 2019-04-14 | Discharge: 2019-04-18 | Disposition: A | Discharge status: Other Acute Inpatient Hospital | Payer: Medicare Other | Attending: Emergency Medicine | Admitting: Emergency Medicine

## 2019-04-14 ENCOUNTER — Other Ambulatory Visit: Payer: Self-pay

## 2019-04-14 ENCOUNTER — Encounter (HOSPITAL_COMMUNITY): Payer: Self-pay | Admitting: *Deleted

## 2019-04-14 DIAGNOSIS — Z01818 Encounter for other preprocedural examination: Secondary | ICD-10-CM

## 2019-04-14 DIAGNOSIS — Z20828 Contact with and (suspected) exposure to other viral communicable diseases: Secondary | ICD-10-CM | POA: Diagnosis not present

## 2019-04-14 DIAGNOSIS — Z79899 Other long term (current) drug therapy: Secondary | ICD-10-CM | POA: Insufficient documentation

## 2019-04-14 DIAGNOSIS — R456 Violent behavior: Secondary | ICD-10-CM | POA: Diagnosis not present

## 2019-04-14 DIAGNOSIS — Z87891 Personal history of nicotine dependence: Secondary | ICD-10-CM | POA: Diagnosis not present

## 2019-04-14 DIAGNOSIS — F0391 Unspecified dementia with behavioral disturbance: Secondary | ICD-10-CM | POA: Insufficient documentation

## 2019-04-14 DIAGNOSIS — F0394 Unspecified dementia, unspecified severity, with anxiety: Secondary | ICD-10-CM | POA: Diagnosis present

## 2019-04-14 DIAGNOSIS — Z03818 Encounter for observation for suspected exposure to other biological agents ruled out: Secondary | ICD-10-CM | POA: Diagnosis not present

## 2019-04-14 DIAGNOSIS — Z008 Encounter for other general examination: Secondary | ICD-10-CM | POA: Diagnosis not present

## 2019-04-14 DIAGNOSIS — R4586 Emotional lability: Secondary | ICD-10-CM | POA: Diagnosis not present

## 2019-04-14 DIAGNOSIS — F322 Major depressive disorder, single episode, severe without psychotic features: Secondary | ICD-10-CM | POA: Diagnosis not present

## 2019-04-14 DIAGNOSIS — I447 Left bundle-branch block, unspecified: Secondary | ICD-10-CM | POA: Diagnosis not present

## 2019-04-14 DIAGNOSIS — R451 Restlessness and agitation: Secondary | ICD-10-CM | POA: Diagnosis not present

## 2019-04-14 DIAGNOSIS — R45851 Suicidal ideations: Secondary | ICD-10-CM | POA: Diagnosis not present

## 2019-04-14 DIAGNOSIS — Z7901 Long term (current) use of anticoagulants: Secondary | ICD-10-CM | POA: Insufficient documentation

## 2019-04-14 DIAGNOSIS — F0390 Unspecified dementia without behavioral disturbance: Secondary | ICD-10-CM | POA: Diagnosis not present

## 2019-04-14 LAB — URINALYSIS, ROUTINE W REFLEX MICROSCOPIC
Bilirubin Urine: NEGATIVE
Glucose, UA: NEGATIVE mg/dL
Hgb urine dipstick: NEGATIVE
Ketones, ur: NEGATIVE mg/dL
Leukocytes,Ua: NEGATIVE
Nitrite: NEGATIVE
Protein, ur: NEGATIVE mg/dL
Specific Gravity, Urine: 1.011 (ref 1.005–1.030)
pH: 6 (ref 5.0–8.0)

## 2019-04-14 LAB — COMPREHENSIVE METABOLIC PANEL
ALT: 34 U/L (ref 0–44)
AST: 39 U/L (ref 15–41)
Albumin: 4.6 g/dL (ref 3.5–5.0)
Alkaline Phosphatase: 99 U/L (ref 38–126)
Anion gap: 10 (ref 5–15)
BUN: 23 mg/dL (ref 8–23)
CO2: 25 mmol/L (ref 22–32)
Calcium: 9.4 mg/dL (ref 8.9–10.3)
Chloride: 103 mmol/L (ref 98–111)
Creatinine, Ser: 1.06 mg/dL (ref 0.61–1.24)
GFR calc Af Amer: >60 mL/min (ref >60)
GFR calc non Af Amer: >60 mL/min (ref >60)
Glucose, Bld: 106 mg/dL — ABNORMAL HIGH (ref 70–99)
Potassium: 3.9 mmol/L (ref 3.5–5.1)
Sodium: 138 mmol/L (ref 135–145)
Total Bilirubin: 0.9 mg/dL (ref 0.3–1.2)
Total Protein: 8 g/dL (ref 6.5–8.1)

## 2019-04-14 LAB — ETHANOL: Alcohol, Ethyl (B): <10 mg/dL (ref <10)

## 2019-04-14 LAB — SALICYLATE LEVEL: Salicylate Lvl: <7 mg/dL (ref 2.8–30.0)

## 2019-04-14 LAB — RAPID URINE DRUG SCREEN, HOSP PERFORMED
Amphetamines: NOT DETECTED
Barbiturates: NOT DETECTED
Benzodiazepines: POSITIVE — AB
Cocaine: NOT DETECTED
Opiates: NOT DETECTED
Tetrahydrocannabinol: NOT DETECTED

## 2019-04-14 LAB — CBC
HCT: 45.1 % (ref 39.0–52.0)
Hemoglobin: 14.5 g/dL (ref 13.0–17.0)
MCH: 30.9 pg (ref 26.0–34.0)
MCHC: 32.2 g/dL (ref 30.0–36.0)
MCV: 96.2 fL (ref 80.0–100.0)
Platelets: 148 10*3/uL — ABNORMAL LOW (ref 150–400)
RBC: 4.69 MIL/uL (ref 4.22–5.81)
RDW: 14 % (ref 11.5–15.5)
WBC: 8.8 10*3/uL (ref 4.0–10.5)
nRBC: 0 % (ref 0.0–0.2)

## 2019-04-14 LAB — ACETAMINOPHEN LEVEL: Acetaminophen (Tylenol), Serum: <10 ug/mL — ABNORMAL LOW (ref 10–30)

## 2019-04-14 LAB — SARS CORONAVIRUS 2 BY RT PCR (HOSPITAL ORDER, PERFORMED IN ~~LOC~~ HOSPITAL LAB): SARS Coronavirus 2: NEGATIVE

## 2019-04-14 MED ORDER — FA-PYRIDOXINE-CYANOCOBALAMIN 2.5-25-2 MG PO TABS
1.0000 | ORAL_TABLET | Freq: Two times a day (BID) | ORAL | Status: DC
  Administered 2019-04-15 – 2019-04-17 (×5): 1 via ORAL
  Filled 2019-04-14 (×9): qty 1

## 2019-04-14 MED ORDER — MEMANTINE HCL 10 MG PO TABS
10.0000 mg | ORAL_TABLET | Freq: Two times a day (BID) | ORAL | Status: DC
  Administered 2019-04-15 – 2019-04-17 (×5): 10 mg via ORAL
  Filled 2019-04-14 (×8): qty 1

## 2019-04-14 MED ORDER — VITAMIN C 500 MG PO TABS
500.0000 mg | ORAL_TABLET | Freq: Every day | ORAL | Status: DC
  Administered 2019-04-16 – 2019-04-17 (×2): 500 mg via ORAL
  Filled 2019-04-14 (×5): qty 1

## 2019-04-14 MED ORDER — SERTRALINE HCL 50 MG PO TABS
50.0000 mg | ORAL_TABLET | Freq: Every day | ORAL | Status: DC
  Administered 2019-04-16 – 2019-04-17 (×2): 50 mg via ORAL
  Filled 2019-04-14 (×3): qty 1

## 2019-04-14 MED ORDER — DONEPEZIL HCL 5 MG PO TABS
10.0000 mg | ORAL_TABLET | Freq: Every day | ORAL | Status: DC
  Administered 2019-04-15 – 2019-04-17 (×3): 10 mg via ORAL
  Filled 2019-04-14 (×3): qty 2

## 2019-04-14 MED ORDER — ALPRAZOLAM 0.25 MG PO TABS: 0.2500 mg | ORAL_TABLET | Freq: Two times a day (BID) | ORAL | Status: DC | PRN

## 2019-04-14 MED ORDER — ATORVASTATIN CALCIUM 40 MG PO TABS
40.0000 mg | ORAL_TABLET | Freq: Every day | ORAL | Status: DC
  Administered 2019-04-15: 40 mg via ORAL
  Filled 2019-04-14 (×4): qty 1

## 2019-04-14 MED ORDER — APIXABAN 5 MG PO TABS
5.0000 mg | ORAL_TABLET | Freq: Two times a day (BID) | ORAL | Status: DC
  Administered 2019-04-15 – 2019-04-17 (×5): 5 mg via ORAL
  Filled 2019-04-14 (×9): qty 1

## 2019-04-14 MED ORDER — QUETIAPINE FUMARATE 25 MG PO TABS
25.0000 mg | ORAL_TABLET | Freq: Every day | ORAL | Status: DC
  Administered 2019-04-15 – 2019-04-17 (×3): 25 mg via ORAL
  Filled 2019-04-14 (×3): qty 1

## 2019-04-14 MED ORDER — AMLODIPINE BESYLATE 5 MG PO TABS
5.0000 mg | ORAL_TABLET | Freq: Every day | ORAL | Status: DC
  Administered 2019-04-16 – 2019-04-17 (×2): 5 mg via ORAL
  Filled 2019-04-14 (×3): qty 1

## 2019-04-14 MED ORDER — VITAMIN D 25 MCG (1000 UNIT) PO TABS
1000.0000 [IU] | ORAL_TABLET | Freq: Every day | ORAL | Status: DC
  Administered 2019-04-16 – 2019-04-17 (×2): 1000 [IU] via ORAL
  Filled 2019-04-14 (×5): qty 1

## 2019-04-14 NOTE — ED Notes (Signed)
Patient refused his night medications
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
## 2019-04-14 NOTE — ED Notes (Signed)
Patient refused dinner tray.  
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
## 2019-04-14 NOTE — ED Notes (Signed)
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

## 2019-04-14 NOTE — ED Notes (Signed)
Patient refused night medications. Patient states: "what's the use? I'm tired of living like this. I feel belittled. 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 I am not a fool. I just have dementia, and if I die so be it I know where I'm going because I'm a [place]"

## 2019-04-14 NOTE — BH Assessment (Signed)
Tele Assessment Note   Patient Name: [facility] MRN: [id number] Referring Physician: Dr. Lacretia Leigh Location of Patient: [facility] Location of Provider: [place] [facility]  [name] is an 85 y.o. male.  -Clinician reviewed note by Dr. Zenia Resides.  85 year old male with history of dementia presents with increasing agitation.  Patient denies any SI or HI.  No new medications.  According to his Jeffery Rogers, patient placed a knife to his throat.  He denies this.  Has not been hallucinating.  Reportedly was more aggressive with his spouse.  Patient says he did hold a knife about 6 inches away from his neck earlier today.  He admits that he has had some thoughts of killing himself because he does not want to get worse with his dementia and be dependent on others.  He said, "If I have to deal with this, I don't want to be around."  When asked for clarification he said "I don't want to be on this earth."  Patient said he has seen people with advanced dementia and he does not want to get to that state.  Patient denies any HI or A/V hallucinations.  No use of ETOH or other substances.  Patient gave permission for clinician to talk to Jeffery Rogers.  Jeffery Rogers said that patient has been increasingly agitated since Thursday (04/12/19).  He started talking about his son, who lives in another state.  He would talk about how the son had taken his car without permission. Jeffery Rogers said he would talk bad about the son then later talk about how fortunate that they had good son and daughter.    Jeffery Rogers said that yesterday (07/10) he was more restless and made a couple of comments about wanting to not be around.  Today he became agitated after talking about the son.  He hit his fists against a table then started looking for a knife.  He got one and asked his Jeffery Rogers if it was sharp.  She told him she thought it was then he said "I am going to use this knife to cut my throat."  She told him he should not do that kind of thing.  He  made a comment about going out to the driveway "to do it in front of everyone."    Patient has not had any previous history of suicide attempts.  He has no past psychiatric history.  Jeffery Rogers was able to get him to agree to come to [facility] for an assessment.  He was referred to [facility] for medical clearance.    Jeffery Rogers said that she did try to contact his neurologist Dr. Marcial Rogers yesterday (07/10).  She wants to contact her on Monday too.  Clinician informed her that patient would be referred for inpatient geriatric psychiatric care.  She understood.    -Clinician discussed patient care with Jeffery Spell, NP.  She said that patient does meet inpatient geropsych placement criteria.  Clinician informed Dr. Wyvonnia Dusky at [facility] of disposition.  TTS to seek placement.  Diagnosis: F32.2 MDD single episode, severe; dementia  Past Medical History:  Past Medical History:  Diagnosis Date  . Arthritis    "right arm/shoulder" (02/10/2016)  . Complete heart block ([place]) 02/10/2016   a. s/p Emergency planning/management officer  . Goiter   . Hypercholesteremia   . Memory loss   . Nasal septal deviation   . Persistent atrial fibrillation   . Presence of permanent cardiac pacemaker   . S/P TAVR (transcatheter aortic valve replacement) 05/30/2018  23 mm Edwards Sapien 3 transcatheter heart valve placed via percutaneous right transfemoral approach   . Severe aortic stenosis   . Tremor     Past Surgical History:  Procedure Laterality Date  . CATARACT EXTRACTION W/ INTRAOCULAR LENS  IMPLANT, BILATERAL Bilateral ~ 2000  . EP IMPLANTABLE DEVICE N/A 02/11/2016   Procedure: Pacemaker Implant;  Surgeon: Merkey Lance, MD;  Location: [place] [facility];  Service: Cardiovascular;  Laterality: N/A;  . RIGHT/LEFT HEART CATH AND CORONARY ANGIOGRAPHY N/A 05/03/2018   Procedure: RIGHT/LEFT HEART CATH AND CORONARY ANGIOGRAPHY;  Surgeon: Burnell Blanks, MD;  Location: [facility];  Service: Cardiovascular;  Laterality: N/A;  . TEE  WITHOUT CARDIOVERSION N/A 05/30/2018   Procedure: TRANSESOPHAGEAL ECHOCARDIOGRAM (TEE);  Surgeon: Burnell Blanks, MD;  Location: [place];  Service: Open Heart Surgery;  Laterality: N/A;  . TONSILLECTOMY  1930s  . TRANSCATHETER AORTIC VALVE REPLACEMENT, TRANSFEMORAL N/A 05/30/2018   Procedure: TRANSCATHETER AORTIC VALVE REPLACEMENT, TRANSFEMORAL using a 3mm Edwards Sapien 3 Aortic Valve;  Surgeon: Burnell Blanks, MD;  Location: [facility];  Service: Open Heart Surgery;  Laterality: N/A;  . WISDOM TOOTH EXTRACTION  1990s    Family History:  Family History  Problem Relation Age of Onset  . Stroke Other   . Irregular heart beat Other   . Stroke Mother   . Stroke Father   . CAD Father        MI    Social History:  reports that he has quit smoking. His smoking use included cigarettes. He has a 16.00 pack-year smoking history. He has never used smokeless tobacco. He reports that he does not drink alcohol or use drugs.  Additional Social History:  Alcohol / Drug Use Pain Medications: See PTA medication list Prescriptions: See PTA medication list Over the Counter: See PTA medication list History of alcohol / drug use?: No history of alcohol / drug abuse  CIWA: CIWA-Ar BP: (!) 171/74 Pulse Rate: 60 COWS:    Allergies: No Known Allergies  Home Medications: (Not in a hospital admission)   OB/GYN Status:  No LMP for male patient.  General Assessment Data [facility] of Assessment: [facility] TTS Assessment: In system Is this a Tele or Face-to-Face Assessment?: Tele Assessment Is this an Initial Assessment or a Re-assessment for this encounter?: Initial Assessment Patient Accompanied by:: N/A Language Other than English: No Living Arrangements: Other (Comment)(With spouse.) What gender do you identify as?: Male Marital status: Married Pregnancy Status: No Living Arrangements: Spouse/significant other Can pt return to current living arrangement?: Yes Admission Status:  Voluntary Is patient capable of signing voluntary admission?: No Referral Source: Self/Family/Friend Insurance type: MCR & BC/BS supplement     Crisis Care Plan Living Arrangements: Spouse/significant other Name of Psychiatrist: None Name of Therapist: None  Education Status Is patient currently in school?: No Is the patient employed, unemployed or receiving disability?: Unemployed(Pt is retired.)  Risk to self with the past 6 months Suicidal Ideation: Yes-Currently Present Has patient been a risk to self within the past 6 months prior to admission? : No Suicidal Intent: Yes-Currently Present Has patient had any suicidal intent within the past 6 months prior to admission? : No Is patient at risk for suicide?: Yes Suicidal Plan?: Yes-Currently Present Has patient had any suicidal plan within the past 6 months prior to admission? : No Specify Current Suicidal Plan: Cut himself Access to Means: Yes Specify Access to Suicidal Means: knife What has been your use of drugs/alcohol  within the last 12 months?: Denies Previous Attempts/Gestures: No How many times?: 0 Other Self Harm Risks: None Triggers for Past Attempts: None known Intentional Self Injurious Behavior: None Family Suicide History: No Recent stressful life event(s): Recent negative physical changes(Depression over advancing dementia) Persecutory voices/beliefs?: Yes Depression: Yes Depression Symptoms: Despondent, Loss of interest in usual pleasures Substance abuse history and/or treatment for substance abuse?: No Suicide prevention information given to non-admitted patients: Not applicable  Risk to Others within the past 6 months Homicidal Ideation: No Does patient have any lifetime risk of violence toward others beyond the six months prior to admission? : No Thoughts of Harm to Others: No Current Homicidal Intent: No Current Homicidal Plan: No Access to Homicidal Means: No Identified Victim: No one History of  harm to others?: No Assessment of Violence: None Noted Violent Behavior Description: None reported Does patient have access to weapons?: Yes (Comment)(knives) Criminal Charges Pending?: No Does patient have a court date: No Is patient on probation?: No  Psychosis Hallucinations: None noted Delusions: None noted  Mental Status Report Appearance/Hygiene: Unremarkable Eye Contact: Good Motor Activity: Freedom of movement, Restlessness Speech: Logical/coherent Level of Consciousness: Alert Mood: Depressed, Anxious, Helpless, Sad Affect: Anxious, Depressed, Irritable Anxiety Level: Severe Thought Processes: Coherent, Relevant Judgement: Impaired Orientation: Person, Place Obsessive Compulsive Thoughts/Behaviors: Minimal  Cognitive Functioning Concentration: Decreased Memory: Recent Impaired, Remote Impaired Is patient IDD: No Insight: Fair Impulse Control: Poor Appetite: Fair Have you had any weight changes? : No Change Sleep: No Change Total Hours of Sleep: 8 Vegetative Symptoms: None  ADLScreening [facility] Assessment Services) Patient's cognitive ability adequate to safely complete daily activities?: Yes Patient able to express need for assistance with ADLs?: Yes Independently performs ADLs?: Yes (appropriate for developmental age)  Prior Inpatient Therapy Prior Inpatient Therapy: No  Prior Outpatient Therapy Prior Outpatient Therapy: No Does patient have an ACCT team?: No Does patient have Intensive In-House Services?  : No Does patient have [facility] services? : No Does patient have P4CC services?: No  ADL Screening (condition at time of admission) Patient's cognitive ability adequate to safely complete daily activities?: Yes Is the patient deaf or have difficulty hearing?: Yes Does the patient have difficulty seeing, even when wearing glasses/contacts?: No(Does wear glasses.) Does the patient have difficulty concentrating, remembering, or making decisions?:  Yes Patient able to express need for assistance with ADLs?: Yes Does the patient have difficulty dressing or bathing?: No Independently performs ADLs?: Yes (appropriate for developmental age) Does the patient have difficulty walking or climbing stairs?: No Weakness of Legs: None Weakness of Arms/Hands: None       Abuse/Neglect Assessment (Assessment to be complete while patient is alone) Abuse/Neglect Assessment Can Be Completed: Yes Physical Abuse: Denies Verbal Abuse: Denies Sexual Abuse: Denies Exploitation of patient/patient's resources: Denies     Regulatory affairs officer (For Healthcare) Does Patient Have a Medical Advance Directive?: Yes Type of Advance Directive: Healthcare Power of Attorney, Living will          Disposition:  Disposition Initial Assessment Completed for this Encounter: Yes Patient referred to: Other (Comment)(Gero psych facility)  This service was provided via telemedicine using a 2-way, interactive audio and video technology.  Names of all persons participating in this telemedicine service and their role in this encounter. Name: Kendell Bane Role: patient  Name: Rosebud Poles Role: spouse  Name: Curlene Dolphin, M.S. LCAS QP Role: clinician  Name:  Role:     Raymondo Band 04/14/2019 11:37 PM

## 2019-04-15 DIAGNOSIS — R4586 Emotional lability: Secondary | ICD-10-CM

## 2019-04-15 DIAGNOSIS — R451 Restlessness and agitation: Secondary | ICD-10-CM

## 2019-04-15 DIAGNOSIS — R456 Violent behavior: Secondary | ICD-10-CM

## 2019-04-15 DIAGNOSIS — F322 Major depressive disorder, single episode, severe without psychotic features: Secondary | ICD-10-CM | POA: Diagnosis not present

## 2019-04-15 DIAGNOSIS — R45851 Suicidal ideations: Secondary | ICD-10-CM | POA: Diagnosis not present

## 2019-04-15 LAB — URINE CULTURE: Culture: <10000 — AB

## 2019-04-15 MED ORDER — STERILE WATER FOR INJECTION IJ SOLN
INTRAMUSCULAR | Status: AC
  Administered 2019-04-15: 1 mL
  Filled 2019-04-15: qty 10

## 2019-04-15 MED ORDER — ZIPRASIDONE MESYLATE 20 MG IM SOLR
INTRAMUSCULAR | Status: AC
  Filled 2019-04-15: qty 20

## 2019-04-15 MED ORDER — ZIPRASIDONE MESYLATE 20 MG IM SOLR
10.0000 mg | Freq: Once | INTRAMUSCULAR | Status: AC
  Administered 2019-04-15: 10 mg via INTRAMUSCULAR

## 2019-04-15 MED ORDER — LORAZEPAM 2 MG/ML IJ SOLN
1.0000 mg | Freq: Once | INTRAMUSCULAR | Status: AC
  Administered 2019-04-15: 1 mg via INTRAMUSCULAR
  Filled 2019-04-15: qty 1

## 2019-04-15 MED ORDER — STERILE WATER FOR INJECTION IJ SOLN
INTRAMUSCULAR | Status: AC
  Administered 2019-04-15: 2 mL
  Filled 2019-04-15: qty 10

## 2019-04-15 MED ORDER — ZIPRASIDONE MESYLATE 20 MG IM SOLR
10.0000 mg | Freq: Once | INTRAMUSCULAR | Status: AC
  Administered 2019-04-15: 10 mg via INTRAMUSCULAR
  Filled 2019-04-15: qty 20

## 2019-04-15 MED ORDER — LORAZEPAM 1 MG PO TABS: 1.0000 mg | ORAL_TABLET | Freq: Four times a day (QID) | ORAL | Status: DC | PRN

## 2019-04-15 NOTE — ED Notes (Signed)
Pt sitting in chair slapping his legs and shaking his head.

## 2019-04-15 NOTE — ED Notes (Addendum)
Other hearing aid found and placed in the same specimen cup.

## 2019-04-15 NOTE — Progress Notes (Signed)
Per Dr. Darleene Cleaver MD, patient meets criteria for inpatient treatment-gero psych. CSW faxed referrals to the following facilities for review:  [place], [facility], [place], [place], [place], [place], [place], [place], [place], [place].   TTS will continue to seek bed placement.   Maxie Better, MSW, LCSW Clinical Social Worker 04/15/2019 4:01 PM

## 2019-04-15 NOTE — ED Notes (Signed)
Pt's hearing aid (1) placed in a specimen cup next to computer at pt request.

## 2019-04-15 NOTE — ED Notes (Signed)
Patient is resting comfortably. Sitter at bedside.  

## 2019-04-15 NOTE — ED Notes (Signed)
Patient asleep, RN will give medications to patient once awake due to behavioral issues yesterday and last night.

## 2019-04-15 NOTE — ED Notes (Signed)
Pt pacing in room. Pt stated: "I don't think I can sleep so I might as well be up. I think I have too many grudges and I'm just going to be negative".

## 2019-04-15 NOTE — ED Notes (Addendum)
Pt wife requested that Dr. Krista Blue with [facility] (patients neurology) evaluate patient.

## 2019-04-15 NOTE — ED Notes (Signed)
Pt getting aggressively more agitated with sitter being in his room. Pt gets agitated when staff ask him if they can complete any orders.

## 2019-04-15 NOTE — ED Notes (Signed)
Per Social Worker:  Per Dr. Darleene Cleaver MD, patient meets criteria for inpatient treatment-gero psych. CSW faxed referrals to the following facilities for review:  [place], [facility], [place], [place], [place], [place], [place], [place], [place], [place].   TTS will continue to seek bed placement.   Maxie Better, MSW, LCSW Clinical Social Worker 04/15/2019 4:01 PM

## 2019-04-15 NOTE — ED Notes (Signed)
Pt attempted to get out of bed again. When asked to remain in the bed, pt became combative and hit this Probation officer. Security, Lovena Le, RN, and Zenia Resides, MD were notified. Pt slid back in bed.

## 2019-04-15 NOTE — ED Notes (Signed)
Per TTS: Seeking placement for Geri-Psych however patient wife wants patient local and wife wants to be consulted before placement is made.

## 2019-04-15 NOTE — ED Provider Notes (Signed)
We will check back with TTS to verify disposition.  There is conflicting notes once it patient did not meet criteria for geriatric psych but then there is a note saying that he can seek placement.  This may mean that they are looking for nursing home placement.  We will get clarification.   Fredia Sorrow, MD 04/15/19 [phone]

## 2019-04-15 NOTE — ED Notes (Addendum)
Pt has become more agitated and confused. Pt kicked this Probation officer and continues to stop his feet. Pt attempted to get out of bed, but this Probation officer and Josh, EMT redirected pt back to bed.

## 2019-04-15 NOTE — ED Notes (Signed)
Patient began to be aggressive towards staff throwing his hearing aids and trying to lift up bed, balling up fists while talking to RN. Security called to stand by. Patient was restrained physically on bed to give IM medication. Patient was not willing and compliant. Patient is stable, on cardiac monitor with sitter at bedside and security standing by until medication has taken effect.

## 2019-04-15 NOTE — ED Notes (Signed)
Pt is sleeping. No acute distress. Sitter at bedside

## 2019-04-15 NOTE — ED Notes (Signed)
Pt awake and agitated, used restroom and wants to go back to sleep. Patient stated "I thought I would live a little longer but I am ready for it to be over." Patient states he is not hungry and does not want meal tray

## 2019-04-15 NOTE — ED Notes (Signed)
Patient has been provided Hot meal.

## 2019-04-15 NOTE — ED Notes (Signed)
Pt is calm after using the restroom and walking the halls, decided to lay down to try to get some rest

## 2019-04-15 NOTE — Progress Notes (Addendum)
[facility] Psych ED Progress Note Patient is reassessed with Dr. Darleene Cleaver. At this time he continues to rest comfortably and is uncooperative with the assessment. Patient asked for [place] to leave the room in order to complete assessment with his wife. At this time he continues to exhibit mood lability and agitation. Upon seeking clarification with his wife, she reports some increased aggression (throwing objects, and pushing objects over), verbally abusive towards her. She also reports he pulled a knife from the kitchen drawer, and she was able to de-esclate the situation. SHe reports increase passive suicidal ideations, feelings of hopelessness, and worthlessness, and remains in denial about his progression of dementia. He is under the care of Dr. Evelena Leyden at [facility] who is managing his dementia. SHe is concerned about his overall depression and suicidal thoughts and would like Dr. Evelena Leyden to be consulted prior to making any decisions. Discussed in length with wife about treatment services to include inpatient gero psych admission, and outpatient resources. At this time she would like to be consulted prior to any decisions are made to include mild medications for agitation. She is aware that he will be referred to multiple facilities, and by law we have to take the first bed available that is accepted. She would like to keep him local at this time. Will  Continue to seek inpatient admission at this time.   Patient seen face-to-face for psychiatric evaluation, chart reviewed and case discussed with the physician extender and developed treatment plan. Reviewed the information documented and agree with the treatment plan. Corena Pilgrim, MD

## 2019-04-16 ENCOUNTER — Encounter (HOSPITAL_COMMUNITY): Payer: Self-pay | Admitting: Registered Nurse

## 2019-04-16 ENCOUNTER — Emergency Department (HOSPITAL_COMMUNITY): Payer: Medicare Other

## 2019-04-16 DIAGNOSIS — R451 Restlessness and agitation: Secondary | ICD-10-CM | POA: Diagnosis not present

## 2019-04-16 DIAGNOSIS — F0390 Unspecified dementia without behavioral disturbance: Secondary | ICD-10-CM | POA: Diagnosis not present

## 2019-04-16 DIAGNOSIS — F322 Major depressive disorder, single episode, severe without psychotic features: Secondary | ICD-10-CM | POA: Diagnosis not present

## 2019-04-16 DIAGNOSIS — Z87891 Personal history of nicotine dependence: Secondary | ICD-10-CM | POA: Diagnosis not present

## 2019-04-16 DIAGNOSIS — F0391 Unspecified dementia with behavioral disturbance: Secondary | ICD-10-CM | POA: Diagnosis not present

## 2019-04-16 DIAGNOSIS — R45851 Suicidal ideations: Secondary | ICD-10-CM | POA: Diagnosis not present

## 2019-04-16 DIAGNOSIS — F0394 Unspecified dementia, unspecified severity, with anxiety: Secondary | ICD-10-CM | POA: Diagnosis present

## 2019-04-16 MED ORDER — OLANZAPINE 10 MG IM SOLR
5.0000 mg | Freq: Once | INTRAMUSCULAR | Status: AC
  Administered 2019-04-17: 5 mg via INTRAMUSCULAR
  Filled 2019-04-16: qty 10

## 2019-04-16 MED ORDER — OLANZAPINE 10 MG IM SOLR
5.0000 mg | Freq: Two times a day (BID) | INTRAMUSCULAR | Status: DC | PRN
  Filled 2019-04-16: qty 10

## 2019-04-16 NOTE — ED Notes (Signed)
Patient becoming increasingly agitated and aggressive towards staff. Patient making comments about ending his life because he has "lived his life and is okay with dying." EDP made aware of aggression towards staff and SI . Patient able to be redirected to bed.
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
## 2019-04-16 NOTE — ED Notes (Signed)
EKG handed to MD Vanita Panda.

## 2019-04-16 NOTE — ED Notes (Signed)
Patient resting quietly. Denies any needs at this time. Will continue to monitor patient.

## 2019-04-16 NOTE — ED Notes (Signed)
Patient tolerated medication administration well. Security at bedside on standby for increased agitation and aggression. Patient in no acute distress at the moment. Will continue to monitor patient.

## 2019-04-16 NOTE — ED Notes (Signed)
Patient ambulated to the bathroom with NT assistance. Patient ambulated w/ a steady gate.
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
## 2019-04-16 NOTE — BH Assessment (Signed)
[facility] Assessment Progress Note  Per Buford Dresser, DO this pt requires psychiatric hospitalization at this time at a facility providing specialty geriatric services.  Dr Mariea Clonts also finds that pt meets criteria for IVC, which she has initiated.  IVC documents have been faxed to [facility], and at [employer] confirms receipt.  He has since faxed Findings and Custody Order to this Probation officer.  At 16:24 I called [employer] and spoke to [employer], who took demographic information, agreeing to dispatch law enforcement to fill out Return of Service.  As of this writing arrival of law enforcement is pending.  Placement will be sought for pt.  Pt's nurse reports that pt's spouse has been trying to reach ED staff to ask about disposition.  The nurse reports that pt has signed consent to release information to the spouse.  ED secretary Santiago Glad fielded the call and informed her.  Please keep the spouse informed of final disposition.  Jalene Mullet, [place] Coordinator [phone]

## 2019-04-17 ENCOUNTER — Telehealth: Payer: Self-pay | Admitting: Neurology

## 2019-04-17 DIAGNOSIS — R45851 Suicidal ideations: Secondary | ICD-10-CM | POA: Diagnosis not present

## 2019-04-17 DIAGNOSIS — F322 Major depressive disorder, single episode, severe without psychotic features: Secondary | ICD-10-CM | POA: Diagnosis not present

## 2019-04-17 DIAGNOSIS — F0391 Unspecified dementia with behavioral disturbance: Secondary | ICD-10-CM | POA: Diagnosis not present

## 2019-04-17 DIAGNOSIS — R451 Restlessness and agitation: Secondary | ICD-10-CM | POA: Diagnosis not present

## 2019-04-17 NOTE — BH Assessment (Addendum)
[facility] Assessment Progress Note  At 16:45 this writer called pt's wife, Nicholes Hibler [phone]), to update her on pt's disposition.  During conversation I informed her that pt was placed under IVC for his safety and to assure that he could be admitted to a facility even if he did not agree to transfer.  Mrs Headley became very upset about this.  In this context I asked if she is pt's legal guardian or his healthcare power of attorney.  She then informed me that she is healthcare power of attorney, and that she had left documents with [facility] staff when she brought pt to the hospital.  I was then able to find documents in pt's EPIC record.  I informed her that, while it is the physician's prerogative to initiate an IVC at any time that the physician believes that it is in the interest of the pt's safety and that the pt is a danger to self or others, that I would nonetheless discuss this with Buford Dresser, DO.  I also informed Mrs Shankar that the Dr Mariea Clonts is rounding on the pt daily, and that we are working to stabilize him in the ED, but that we are nonetheless seeking psychiatric placement for him at a geriatric specialty facility. Mrs Pipkins agrees to this course of action, and agrees to visit pt in the ED to discuss goals with him, to calm him, and to offer her perspective on pt's demeanor now as compared to baseline.  After concluding conversation with her, I called Dr Mariea Clonts and staffed details with her.  As of this writing pt remains under IVC, and placement will continue to be sought for pt.  Jalene Mullet, [place] Coordinator [phone]

## 2019-04-17 NOTE — ED Notes (Signed)
pts hearing aids placed in pink container and labeled and all items transfer with pt to room 29 in back. Report given.
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
## 2019-04-17 NOTE — Consult Note (Addendum)
Telepsych Consultation   Reason for Consult:  SI and agitation  Referring Physician:  EDP Location of Patient: [facility] Location of Provider: [facility]  Patient Identification: Jeffery Rogers MRN:  [id number] Principal Diagnosis: Dementia with behavioral disturbance ([place]) Diagnosis:  Principal Problem:   Dementia with behavioral disturbance ([place])   Total Time spent with patient: 15 minutes  Subjective:   Jeffery Rogers is a 85 y.o. male patient admitted with SI and agitation.  HPI:   04/16/2019:  Per chart review, patient's wife brought him to the hospital due to concerns for his safety. He has been increasingly agitated in the setting of worsening dementia. He placed a knife to his throat and stated that he no longer wanted to live. He reports that he does not want to deal with worsening dementia symptoms. His wife was able to deescalate the situation prior to bringing him to the hospital. He has not been himself. He has been intermittently speaking poorly of his family. He has been verbally abusive to his wife.  On interview, Jeffery Rogers reports that he believes that he is in the hospital due to his dementia. He reports placing a knife up to his throat because he was mad after an argument with his wife. He initially reports that religion is a protective factor against suicide but later reports that if he wants to harm himself then he will. He is unable to safety plan. He denies HI. He denies mood symptoms but his wife reports depressive symptoms per TTS assessment note. He does not have access to guns at home. He was combative and agitated overnight per nursing notes.   04/17/2019 Jeffery Rogers, 85 y.o., male patient seen via tele psych by this provider, Dr. Mariea Clonts; and chart reviewed on 04/17/19.  On evaluation Jeffery Rogers continues to report that he wants to end his life.  "I don't like the life that I'm living right now.  I just want to leave here so I can get out of  it."  Patient states that he continues to plan on ending his life "I'm just being honest.  You might not want to hear it but I just want to get out of it.  I am physically alright but I'm not mentally."  Patient states that he is a good [place] and if kills himself he would go to heaven.  "I want to go to heaven to get out of this life." During evaluation Jeffery Rogers is alert/oriented x 4; calm/cooperative; and mood is congruent with affect.  He does not appear to be responding to internal/external stimuli or delusional thoughts.  Patient denies homicidal ideation, psychosis, and paranoia.  Patient answers question appropriately.     Past Psychiatric History: Dementia   Risk to Self: Suicidal Ideation: Yes-Currently Present Suicidal Intent: Yes-Currently Present Is patient at risk for suicide?: Yes Suicidal Plan?: Yes-Currently Present Specify Current Suicidal Plan: Cut himself Access to Means: Yes Specify Access to Suicidal Means: knife What has been your use of drugs/alcohol within the last 12 months?: Denies How many times?: 0 Other Self Harm Risks: None Triggers for Past Attempts: None known Intentional Self Injurious Behavior: None Risk to Others: Homicidal Ideation: No Thoughts of Harm to Others: No Current Homicidal Intent: No Current Homicidal Plan: No Access to Homicidal Means: No Identified Victim: No one History of harm to others?: No Assessment of Violence: None Noted Violent Behavior Description: None reported Does patient have access to weapons?: Yes (Comment)(knives) Criminal Charges  Pending?: No Does patient have a court date: No Prior Inpatient Therapy: Prior Inpatient Therapy: No Prior Outpatient Therapy: Prior Outpatient Therapy: No Does patient have an ACCT team?: No Does patient have Intensive In-House Services?  : No Does patient have [facility] services? : No Does patient have P4CC services?: No  Past Medical History:  Past Medical History:  Diagnosis  Date  . Arthritis    "right arm/shoulder" (02/10/2016)  . Complete heart block ([place]) 02/10/2016   a. s/p Emergency planning/management officer  . Goiter   . Hypercholesteremia   . Memory loss   . Nasal septal deviation   . Persistent atrial fibrillation   . Presence of permanent cardiac pacemaker   . 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 Severe aortic stenosis   . Tremor     Past Surgical History:  Procedure Laterality Date  . CATARACT EXTRACTION W/ INTRAOCULAR LENS  IMPLANT, BILATERAL Bilateral ~ 2000  . EP IMPLANTABLE DEVICE N/A 02/11/2016   Procedure: Pacemaker Implant;  Surgeon: Roorda Lance, MD;  Location: [facility];  Service: Cardiovascular;  Laterality: N/A;  . RIGHT/LEFT HEART CATH AND CORONARY ANGIOGRAPHY N/A 05/03/2018   Procedure: RIGHT/LEFT HEART CATH AND CORONARY ANGIOGRAPHY;  Surgeon: Burnell Blanks, MD;  Location: [facility];  Service: Cardiovascular;  Laterality: N/A;  . TEE WITHOUT CARDIOVERSION N/A 05/30/2018   Procedure: TRANSESOPHAGEAL ECHOCARDIOGRAM (TEE);  Surgeon: Burnell Blanks, MD;  Location: [facility];  Service: Open Heart Surgery;  Laterality: N/A;  . TONSILLECTOMY  1930s  . TRANSCATHETER AORTIC VALVE REPLACEMENT, TRANSFEMORAL N/A 05/30/2018   Procedure: TRANSCATHETER AORTIC VALVE REPLACEMENT, TRANSFEMORAL using a 29mm Edwards Sapien 3 Aortic Valve;  Surgeon: Burnell Blanks, MD;  Location: [facility];  Service: Open Heart Surgery;  Laterality: N/A;  . WISDOM TOOTH EXTRACTION  1990s   Family History:  Family History  Problem Relation Age of Onset  . Stroke Other   . Irregular heart beat Other   . Stroke Mother   . Stroke Father   . CAD Father        MI   Family Psychiatric  History: Denies  Social History:  Social History   Substance and Sexual Activity  Alcohol Use Never  . Alcohol/week: 2.0 standard  drinks  . Types: 1 Cans of beer, 1 Shots of liquor per week  . Frequency: Never     Social History   Substance and Sexual Activity  Drug Use No    Social History   Socioeconomic History  . Marital status: Married    Spouse name: Not on file  . Number of children: 2  . Years of education: Bachelors  . Highest education level: Not on file  Occupational History  . Occupation: Retired Glass blower/designer  . Financial resource strain: Not on file  . Food insecurity    Worry: Not on file    Inability: Not on file  . Transportation needs    Medical: Not on file    Non-medical: Not on file  Tobacco Use  . Smoking status: Former Smoker    Packs/day: 1.00    Years: 16.00    Pack years: 16.00    Types: Cigarettes  . Smokeless tobacco: Never Used  . Tobacco comment: Quit smoking cigarettes in 1971  Substance and Sexual Activity  . Alcohol use: Never    Alcohol/week: 2.0 standard  drinks    Types: 1 Cans of beer, 1 Shots of liquor per week    Frequency: Never  . Drug use: No  . Sexual activity: Yes  Lifestyle  . Physical activity    Days per week: Not on file    Minutes per session: Not on file  . Stress: Not on file  Relationships  . Social Herbalist on phone: Not on file    Gets together: Not on file    Attends religious service: Not on file    Active member of club or organization: Not on file    Attends meetings of clubs or organizations: Not on file    Relationship status: Not on file  Other Topics Concern  . Not on file  Social History Narrative   Lives at home with his wife.   Right-handed.   1 cup coffee per day.  Occasional Coke.   Additional Social History: He lives at home with his wife. He has two adult children. He is retired. He denies alcohol or illicit substance use.     Allergies:  No Known Allergies  Labs:  No results found for this or any previous visit (from the past 48 hour(s)).  Medications:  Current Facility-Administered  Medications  Medication Dose Route Frequency Provider Last Rate Last Dose  . ALPRAZolam Duanne Moron) tablet 0.25 mg  0.25 mg Oral BID PRN Lacretia Leigh, MD      . amLODipine (NORVASC) tablet 5 mg  5 mg Oral Daily Lacretia Leigh, MD   5 mg at 04/17/19 1123  . apixaban (ELIQUIS) tablet 5 mg  5 mg Oral BID Lacretia Leigh, MD   5 mg at 04/17/19 1122  . atorvastatin (LIPITOR) tablet 40 mg  40 mg Oral Daily Lacretia Leigh, MD   40 mg at 04/15/19 2342  . cholecalciferol (VITAMIN D3) tablet 1,000 Units  1,000 Units Oral Daily Lacretia Leigh, MD   1,000 Units at 04/17/19 1123  . donepezil (ARICEPT) tablet 10 mg  10 mg Oral QHS Lacretia Leigh, MD   10 mg at 04/17/19 0100  . folic acid-pyridoxine-cyancobalamin (FOLTX) 2.5-25-2 MG per tablet 1 tablet  1 tablet Oral BID Lacretia Leigh, MD   1 tablet at 04/17/19 1122  . LORazepam (ATIVAN) tablet 1 mg  1 mg Oral Q6H PRN Fredia Sorrow, MD      . memantine [facility]) tablet 10 mg  10 mg Oral BID Lacretia Leigh, MD   10 mg at 04/17/19 1123  . OLANZapine (ZYPREXA) injection 5 mg  5 mg Intramuscular BID PRN Faythe Dingwall, DO      . QUEtiapine (SEROQUEL) tablet 25 mg  25 mg Oral QHS Lacretia Leigh, MD   25 mg at 04/17/19 0100  . sertraline (ZOLOFT) tablet 50 mg  50 mg Oral Daily Lacretia Leigh, MD   50 mg at 04/17/19 1122  . vitamin C (ASCORBIC ACID) tablet 500 mg  500 mg Oral Daily Lacretia Leigh, MD   500 mg at 04/17/19 1124   Current Outpatient Medications  Medication Sig Dispense Refill  . ALPRAZolam (XANAX) 0.25 MG tablet Take 0.25 mg by mouth 2 (two) times daily as needed (agitation).    Marland Kitchen amLODipine (NORVASC) 5 MG tablet Take 1 tablet (5 mg total) by mouth daily. 90 tablet 3  . apixaban (ELIQUIS) 5 MG TABS tablet Take 1 tablet (5 mg total) by mouth 2 (two) times daily. 180 tablet 3  . atorvastatin (LIPITOR) 40 MG tablet Take 1 tablet (40  mg total) by mouth daily. 90 tablet 2  . Cholecalciferol (VITAMIN D-3) 1000 units CAPS Take 1,000 Units by mouth  daily.     Marland Kitchen donepezil (ARICEPT) 10 MG tablet Take 1 tablet (10 mg total) by mouth at bedtime. 90 tablet 4  . ipratropium (ATROVENT) 0.06 % nasal spray Place 1-2 sprays into both nostrils 4 (four) times daily as needed for rhinitis.    Marland Kitchen L-Methylfolate-Algae-B12-B6 (METANX) 3-90.314-2-35 MG CAPS Take 1 capsule by mouth 2 (two) times daily. 180 capsule 4  . memantine (NAMENDA) 10 MG tablet Take 1 tablet (10 mg total) by mouth 2 (two) times daily. 180 tablet 4  . QUEtiapine (SEROQUEL) 25 MG tablet Take 1 tablet (25 mg total) by mouth at bedtime. 90 tablet 3  . sertraline (ZOLOFT) 50 MG tablet Take 50 mg by mouth daily.     . vitamin C (ASCORBIC ACID) 500 MG tablet Take 500 mg by mouth daily.      Musculoskeletal: Strength & Muscle Tone: Spontaneously moves all 4 extremities.  Gait & Station: UTA since patient is sitting in bed. Patient leans: N/A  Psychiatric Specialty Exam: Physical Exam  Nursing note and vitals reviewed. Constitutional: He is oriented to person, place, and time. He appears well-developed and well-nourished.  HENT:  Head: Normocephalic and atraumatic.  Neck: Normal range of motion.  Respiratory: Effort normal.  Musculoskeletal: Normal range of motion.  Neurological: He is alert and oriented to person, place, and time.  Psychiatric: His speech is normal and behavior is normal. Judgment and thought content normal. Cognition and memory are impaired. He exhibits a depressed mood.    Review of Systems  Psychiatric/Behavioral: Positive for memory loss. Negative for depression, substance abuse and suicidal ideas.  All other systems reviewed and are negative.   Blood pressure (!) 119/52, pulse 60, temperature 97.6 F (36.4 C), temperature source Oral, resp. rate 16, SpO2 100 %.There is no height or weight on file to calculate BMI.  General Appearance: Fairly Groomed, elderly, Caucasian male, wearing casual clothes with thinning, gray hair who is sitting in bed. NAD.   Eye  Contact:  Good  Speech:  Clear and Coherent and Normal Rate  Volume:  Normal  Mood:  Euthymic  Affect:  Depressed and Tearful  Thought Process:  Goal Directed, Linear and Descriptions of Associations: Intact  Orientation:  Other:  Oriented to person, place and situation.  Thought Content:  Logical  Suicidal Thoughts:  Yes.  without intent/plan States that he wants to end life but hasn't given a specific plan  Homicidal Thoughts:  No  Memory:  Immediate;   Fair Recent;   Fair Remote;   Good  Judgement:  Poor  Insight:  Fair  Psychomotor Activity:  Normal  Concentration:  Concentration: Good and Attention Span: Good  Recall:  [employer] of Knowledge:  Fair  Language:  Good  Akathisia:  No  Handed:  Right  AIMS (if indicated):   N/A  Assets:  Communication Skills Resilience Social Support  ADL's:  Intact  Cognition:  Impaired with short term memory deficits. History of dementia.   Sleep:   N/A   Assessment:  Jeffery Rogers is a 85 y.o. male who was admitted with SI and agitation. Patient's wife reports concern for his safety at home. He has been increasingly agitated and exhibited symptoms of depression. Patient is unable to safety plan. He warrants inpatient psychiatric hospitalization for stabilization and treatment.  Patient continues to be suicidal and unable to  contract for safety  Treatment Plan Summary: Daily contact with patient to assess and evaluate symptoms and progress in treatment and Medication management -Continue Zoloft 50 mg daily for depression. -Continue Seroquel 25 mg qhs for agitation.  -Ordered Zyprexa 5 mg BID PRN for agitation.  -EKG reviewed and QTc 507 on 7/11. Please closely monitor when starting or increasing QTc prolonging agents. Will repeat to monitor for QTc prolongation.    Disposition: Recommend psychiatric Inpatient admission when medically cleared. Continue to seek inpatient psychiatric treatment.  No changes in treatment plan at this  time.  This service was provided via telemedicine using a 2-way, interactive audio and video technology.  Names of all persons participating in this telemedicine service and their role in this encounter. Name: Buford Dresser, DO Role: Psychiatrist   Name: Earleen Newport Role: NP  Name: Jeffery Rogers Role: Patient    Earleen Newport, NP 04/17/2019 3:53 PM  Patient seen by telemedicine for psychiatric evaluation, chart reviewed and case discussed with the physician extender and developed treatment plan. Reviewed the information documented and agree with the treatment plan.  Buford Dresser, DO 04/17/19 5:53 PM

## 2019-04-17 NOTE — BH Assessment (Signed)
[facility] Assessment Progress Note  Per Buford Dresser, DO, this pt continues to require psychiatric hospitalization at this time.  Pt remains under IVC initiated by Dr Mariea Clonts.  The following facilities have been contacted to seek placement for this pt, with results as noted:  Beds available, information sent, decision pending:  [facility] (referred on 7/13; still under consideration) [facility]   Unable to reach:  [facility] (left message, no reply) [facility] (left message, no reply)   At capacity:  [facility]   Not referred:  [facility] (dementia is exclusionary) [facility] (dementia is exclusionary) [facility] (dementia is exclusionary) [facility] (IVC is exclusionary) [facility]  (dementia is exclusionary)   Jalene Mullet, [place] Coordinator [phone]

## 2019-04-17 NOTE — Progress Notes (Signed)
Received Jeffery Rogers in his room asleep with the sitter at the bedside. Later he woke up to check the time, afterwards he  and sat up in the chair for a period of time. He was compliant with his medications although he felt he did not need medications.

## 2019-04-17 NOTE — Telephone Encounter (Signed)
I have talked with patient's wife and daughter on April 16 2019, she reported patient had sudden worsening of agitation, combative, suicidal attempt.  Reviewed hospital record, he will be admitted for inpatient psychiatric treatment

## 2019-04-17 NOTE — Progress Notes (Signed)
04/17/2019  1845  Spoke with Janett Billow from [facility]. Report given, She is waiting to see if her MD will accept patient. She will be calling back.

## 2019-04-18 DIAGNOSIS — J342 Deviated nasal septum: Secondary | ICD-10-CM | POA: Diagnosis present

## 2019-04-18 DIAGNOSIS — E049 Nontoxic goiter, unspecified: Secondary | ICD-10-CM | POA: Diagnosis present

## 2019-04-18 DIAGNOSIS — F322 Major depressive disorder, single episode, severe without psychotic features: Secondary | ICD-10-CM | POA: Diagnosis not present

## 2019-04-18 DIAGNOSIS — Z8639 Personal history of other endocrine, nutritional and metabolic disease: Secondary | ICD-10-CM | POA: Diagnosis not present

## 2019-04-18 DIAGNOSIS — I4891 Unspecified atrial fibrillation: Secondary | ICD-10-CM | POA: Diagnosis present

## 2019-04-18 DIAGNOSIS — M1990 Unspecified osteoarthritis, unspecified site: Secondary | ICD-10-CM | POA: Diagnosis not present

## 2019-04-18 DIAGNOSIS — Z950 Presence of cardiac pacemaker: Secondary | ICD-10-CM | POA: Diagnosis not present

## 2019-04-18 DIAGNOSIS — F323 Major depressive disorder, single episode, severe with psychotic features: Secondary | ICD-10-CM | POA: Diagnosis not present

## 2019-04-18 DIAGNOSIS — F29 Unspecified psychosis not due to a substance or known physiological condition: Secondary | ICD-10-CM | POA: Diagnosis present

## 2019-04-18 DIAGNOSIS — I482 Chronic atrial fibrillation, unspecified: Secondary | ICD-10-CM | POA: Diagnosis not present

## 2019-04-18 DIAGNOSIS — Z79899 Other long term (current) drug therapy: Secondary | ICD-10-CM | POA: Diagnosis not present

## 2019-04-18 DIAGNOSIS — E785 Hyperlipidemia, unspecified: Secondary | ICD-10-CM | POA: Diagnosis not present

## 2019-04-18 DIAGNOSIS — Z8679 Personal history of other diseases of the circulatory system: Secondary | ICD-10-CM | POA: Diagnosis not present

## 2019-04-18 DIAGNOSIS — I350 Nonrheumatic aortic (valve) stenosis: Secondary | ICD-10-CM | POA: Diagnosis present

## 2019-04-18 MED ORDER — OLANZAPINE 10 MG IM SOLR: 5.0000 mg | Freq: Two times a day (BID) | INTRAMUSCULAR | 0 refills | Status: DC | PRN

## 2019-04-18 NOTE — BH Assessment (Signed)
Jeffery Rogers  At 09:17 this Probation officer received a call from pt's wife/HCPOA, Jeffery Rogers [phone]), regarding pt's disposition.  I reported to her that we had heard from Jeffery Rogers and that they are considering accepting pt.  She then demanded, as pt's HCPOA, that we send pt voluntarily to either Jeffery Rogers or to Jeffery Rogers.  I informed her that after staffing details of yesterday's conversation with Jeffery Dresser, DO a decision was made to keep pt under IVC.  I also explained that EMTALA requires [place] to send pt to the first available facility that is able to provide care for the pt.  She indicates that she disagrees with all of these details.  I informed her that if she feels safe taking the pt home at this time, this may influence Jeffery Rogers decision.  She maintains that pt is currently not safe to go home.  Conversation was then concluded.  Subsequently, Jeffery Rogers received a call from Jeffery Rogers accepting pt (see Rogers dated 04/18/2019 at 9:31 am).  All of these findings have been staffed with Jeffery Rogers, who agrees to transfer under existing IVC.  I then faxed HCPOA documents to Jeffery Rogers.  At 10:29 I received a call from Jeffery Rogers at Jeffery Rogers.  She confirms receipt of document, and will look forward to receiving the pt later today.  I provided her with the name and phone number of Jeffery Rogers, informing her that Jeffery Rogers will need to be kept informed of developments.  After several failed attempts to reach Jeffery Rogers, I succeeded in reaching her at 10:44.  She continues to insist that we cannot transfer pt against her wishes, and reports that she will be coming to Jeffery Rogers with a view toward taking him home.  I offered her contact information for Jeffery Rogers Rogers, but she hung up the phone without accepting it.  Pt's nurse, Diane, has been informed of these details. She reports that sheriff will be at [facility] within 30 minutes to pick pt up.  She agrees to alert  Security.  Jalene Mullet, [place] Coordinator [phone]

## 2019-04-18 NOTE — ED Notes (Signed)
Pt irritable. States, "I' going to starve myself."  Refused breakfast and medication.

## 2019-04-18 NOTE — Discharge Summary (Addendum)
  Patient to be transferred to [facility] for inpatient psychiatric treatment  [name], NP  Patient's chart reviewed. Reviewed the information documented and agree with the treatment plan.  Buford Dresser, DO 04/18/19 5:51 PM

## 2019-04-18 NOTE — Progress Notes (Signed)
Pt accepted to [facility]  Gerrit Halls, MD is the accepting/attending provider.  Dr. is the attending provider.  Call report to [phone] Ask for [place] [email or web address] [place] notified.   Pt is IVC  Pt may be transported by [employer] Pt scheduled  to arrive at [facility] as soon as transportation can be arranged.  Areatha Keas. Judi Cong, MSW, [place] Disposition Clinical Social Work [phone] (cell) [phone] (office)

## 2019-04-18 NOTE — ED Notes (Signed)
Pt ate lunch. Aware that he is going to [facility].

## 2019-04-18 NOTE — ED Notes (Signed)
This Probation officer spent a significant amount of time speaking to the patient's wife.  This Probation officer had been made aware by Gershon Mussel, with TTS, Tim, Agricultural consultant, and Diane, primary RN that the [name] was upset, due to him being IVC'd and being transferred to [facility].  Wife showed up to ED stating she was his POA and demanding he be discharged so she could take him home.  She had earlier been given this option and stated he was not safe to come home to [place].  This Probation officer spoke to Dr Mariea Clonts regarding options.  Dr. Mariea Clonts had reassessed the patient and deemed that she would not be willing to recend the IVC, due to increasing depression and comments around self-harm.  Wife informed of this decision and continually stated that she is his POA and should be able to take him home.  This Probation officer reiterated the same conversations that were previously had by Lupe Carney, and Diane around [place] and IVC.  Wife remained unhappy with situation and called her daughter.  After conversation, she was more resigned to the situation.    This Probation officer offered to have the patient discharged through Triage area, so wife could see him and speak to him briefly.  Wife refused this option.  Stated "I don't want to see him, if I can't take him home," but asked for his wallet.  This Probation officer and Diane RN went through 2 bags of belongings and no wallet was found.  Sean with Security double checked the safe and nothing was found.  Wife given a small black coin purse and keys, the phone number for Patient Experience, thanked this Probation officer for her help, and left.

## 2019-04-18 NOTE — ED Notes (Signed)
Transported to [facility] in [place] by [facility]. All belongings returned to pt. Pt was calm and cooperative. His wife wanted his wallet which was not found by staff in his belongings. Staff, including Altha Harm and Hilliard Clark looked for said wallet.

## 2019-04-23 ENCOUNTER — Other Ambulatory Visit: Payer: Self-pay | Admitting: Neurology

## 2019-04-24 DIAGNOSIS — J309 Allergic rhinitis, unspecified: Secondary | ICD-10-CM | POA: Diagnosis not present

## 2019-04-24 DIAGNOSIS — I10 Essential (primary) hypertension: Secondary | ICD-10-CM | POA: Diagnosis not present

## 2019-04-24 DIAGNOSIS — Z20828 Contact with and (suspected) exposure to other viral communicable diseases: Secondary | ICD-10-CM | POA: Diagnosis not present

## 2019-04-24 DIAGNOSIS — F411 Generalized anxiety disorder: Secondary | ICD-10-CM | POA: Diagnosis not present

## 2019-04-24 NOTE — Telephone Encounter (Signed)
[place] Database Verified LR: 06-29-2017 Qty: 60 Pending appointment: 11-19-2019

## 2019-04-26 ENCOUNTER — Other Ambulatory Visit: Payer: Self-pay | Admitting: Neurology

## 2019-04-26 DIAGNOSIS — R358 Other polyuria: Secondary | ICD-10-CM | POA: Diagnosis not present

## 2019-05-23 ENCOUNTER — Other Ambulatory Visit: Payer: Self-pay

## 2019-05-23 ENCOUNTER — Ambulatory Visit (HOSPITAL_COMMUNITY): Payer: Medicare Other | Attending: Cardiovascular Disease

## 2019-05-23 DIAGNOSIS — I359 Nonrheumatic aortic valve disorder, unspecified: Secondary | ICD-10-CM

## 2019-05-23 DIAGNOSIS — Z952 Presence of prosthetic heart valve: Secondary | ICD-10-CM | POA: Diagnosis not present

## 2019-05-23 NOTE — Progress Notes (Signed)
HEART AND [facility]ASC[facility]ENTER   MULTIDISCIPLINARY HEART VALVE TEAM  Virtual Visit via Telephone Note   This visit type was conducted due to national recommendations for restrictions regarding the COVID-19 Pandemic (e.g. social distancing) in an effort to limit this patient's exposure and mitigate transmission in our community.  Due to his co-morbid illnesses, this patient is at least at moderate risk for complications without adequate follow up.  This format is felt to be most appropriate for this patient at this time.  The patient did not have access to video technology/had technical difficulties with video requiring transitioning to audio format only (telephone).  All issues noted in this document were discussed and addressed.  No physical exam could be performed with this format.  Please refer to the patient's chart for his  consent to telehealth for [facility].   Evaluation Performed:  Follow-up visit  Date:  05/24/2019   ID:  Jeffery Rogers, DOB 08/17/1932, MRN [id number]  Patient Location: Home Provider Location: Home  PCP:  [name], MD  Cardiologist:  Dr. Lovena Le / Dr. Angelena Form &Dr. Roxy Manns (TAVR)   Chief Complaint:  1 year s/p TAVR  History of Present Illness:    Jeffery Rogers is a 85 y.o. male with a history of persistent atrial fibrillation on Eliquis, CHB s/p PPM (2017), dementia with behavioral disturbance, hearing loss and severe ASs/p TAVR (05/30/18) who presents for follow up.  Echocardiogram on 03/24/2018 showed progression to severe aortic stenosis with a mean gradient of 45 mmHg and a peak gradient of 68 mmHg. Left ventricular ejection fraction was 60 to 65%. He complained of some dyspnea on exertion and fatigue.[facility] 05/03/18 showed mild non obst CAD.  He underwent successfulTAVR with a49mm Edwards Sapien 3 THV via the TF approach on 05/30/18. Post operative echoshowed EF 65-70%, normally functioning valve with no obvious PVL and mean gradient 14 mm Hg.He was  discharged on ASA and Eliquis 2.5mg  daily. 1 month echo showed EF 60% with a normally functioning TAVR valve with mild regurgitation and mean gradient of 14 mmHg.   He was seen in the [facility] ED in 04/2019 for dementia with behavioral disturbance with suicidal ideation, agitation and violence. He was transferred to [facility] for inpatient psychiatric treatment.  The patient does not have symptoms concerning for COVID-19 infection (fever, chills, cough, or new shortness of breath).   Today he present via telemedicine for follow up.Wife is present for telephone visit. He is doing quite well with no issues. No CP or SOB. No LE edema, orthopnea or PND. No dizziness or syncope. No blood in stool or urine. No palpitations. He feels quite well but has really slowed down per him and wife. Just says he has no drive to do anything. Wonders if he is too old to exercise.    Past Medical History:  Diagnosis Date  . Arthritis    "right arm/shoulder" (02/10/2016)  . Complete heart block ([place]) 02/10/2016   a. s/p Emergency planning/management officer  . Goiter   . Hypercholesteremia   . Memory loss   . Nasal septal deviation   . Persistent atrial fibrillation   . Presence of permanent cardiac pacemaker   . 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 Severe aortic stenosis   . Tremor    Past Surgical History:  Procedure Laterality Date  . CATARACT EXTRACTION W/ INTRAOCULAR LENS  IMPLANT, BILATERAL  Bilateral ~ 2000  . EP IMPLANTABLE DEVICE N/A 02/11/2016   Procedure: Pacemaker Implant;  Surgeon: Mangold Lance, MD;  Location: [place] [facility];  Service: Cardiovascular;  Laterality: N/A;  . RIGHT/LEFT HEART CATH AND CORONARY ANGIOGRAPHY N/A 05/03/2018   Procedure: RIGHT/LEFT HEART CATH AND CORONARY ANGIOGRAPHY;  Surgeon: Burnell Blanks, MD;  Location: [place] CV [facility];  Service:  Cardiovascular;  Laterality: N/A;  . TEE WITHOUT CARDIOVERSION N/A 05/30/2018   Procedure: TRANSESOPHAGEAL ECHOCARDIOGRAM (TEE);  Surgeon: Burnell Blanks, MD;  Location: [place];  Service: Open Heart Surgery;  Laterality: N/A;  . TONSILLECTOMY  1930s  . TRANSCATHETER AORTIC VALVE REPLACEMENT, TRANSFEMORAL N/A 05/30/2018   Procedure: TRANSCATHETER AORTIC VALVE REPLACEMENT, TRANSFEMORAL using a 80mm Edwards Sapien 3 Aortic Valve;  Surgeon: Burnell Blanks, MD;  Location: [place];  Service: Open Heart Surgery;  Laterality: N/A;  . WISDOM TOOTH EXTRACTION  1990s     Current Meds  Medication Sig  . ALPRAZolam (XANAX) 0.25 MG tablet Take 0.25 mg by mouth 2 (two) times daily as needed (agitation).  Marland Kitchen amLODipine (NORVASC) 5 MG tablet Take 1 tablet (5 mg total) by mouth daily.  Marland Kitchen apixaban (ELIQUIS) 5 MG TABS tablet Take 1 tablet (5 mg total) by mouth 2 (two) times daily.  Marland Kitchen atorvastatin (LIPITOR) 40 MG tablet Take 1 tablet (40 mg total) by mouth daily.  . Cholecalciferol (VITAMIN D-3) 1000 units CAPS Take 1,000 Units by mouth daily.   Marland Kitchen donepezil (ARICEPT) 10 MG tablet Take 1 tablet (10 mg total) by mouth at bedtime.  Marland Kitchen ipratropium (ATROVENT) 0.06 % nasal spray Place 1-2 sprays into both nostrils 4 (four) times daily as needed for rhinitis.  Marland Kitchen L-Methylfolate-Algae-B12-B6 (METANX) 3-90.314-2-35 MG CAPS Take 1 capsule by mouth 2 (two) times daily.  . memantine (NAMENDA) 10 MG tablet Take 1 tablet (10 mg total) by mouth 2 (two) times daily.  . QUEtiapine (SEROQUEL) 25 MG tablet Take 1 tablet (25 mg total) by mouth at bedtime.  . sertraline (ZOLOFT) 50 MG tablet Take 50 mg by mouth daily.   . vitamin C (ASCORBIC ACID) 500 MG tablet Take 500 mg by mouth daily.     Allergies:   Patient has no known allergies.   Social History   Tobacco Use  . Smoking status: Former Smoker    Packs/day: 1.00    Years: 16.00    Pack years: 16.00    Types: Cigarettes  . Smokeless tobacco: Never Used  .  Tobacco comment: Quit smoking cigarettes in 1971  Substance Use Topics  . Alcohol use: Never    Alcohol/week: 2.0 standard drinks    Types: 1 Cans of beer, 1 Shots of liquor per week    Frequency: Never  . Drug use: No     Family Hx: The patient's family history includes CAD in his father; Irregular heart beat in an other family member; Stroke in his father, mother, and another family member.  ROS:   Please see the history of present illness.    All other systems reviewed and are negative.   Prior CV studies:   The following studies were reviewed today:  TAVR OPERATIVE NOTE   Date of Procedure:05/30/2018  Preoperative Diagnosis:Severe Aortic Stenosis   Procedure:   Transcatheter Aortic Valve Replacement - Transfemoral Approach Edwards Sapien 3 THV (size 19mm, model # [id number], serial [id number])  Co-Surgeons:Christopher Angelena Form, MD and Valentina Gu. Roxy Manns, MD   Pre-operative Echo Findings: ? Severe aortic stenosis ? Normalleft ventricular systolic  function  Post-operative Echo Findings: ? Trivial to mildparavalvular leak ? Normalleft ventricular systolic function  ____________  Echo 07/12/18 (1 month s/p TAVR) Study Conclusions - Left ventricle: The cavity size was normal. There was mild concentric hypertrophy. Systolic function was normal. The estimated ejection fraction was in the range of 60% to 65%. Wall motion was normal; there were no regional wall motion abnormalities. - Ventricular septum: Septal motion showed abnormal function, dyssynergy, and paradox. These changes are consistent with right ventricular pacing. - Aortic valve: A stent-valve (TAVR) bioprosthesis was present and functioning normally. There was mild valvular regurgitation (probably not perivavlvular, but difficult to be sure). - Mitral valve: Severely calcified annulus. - Left atrium: The atrium  was mildly dilated. - Right ventricle: The cavity size was mildly dilated. Wall thickness was normal. - Atrial septum: No defect or patent foramen ovale was identified. - Pulmonary arteries: Systolic pressure was mildly increased. PA peak pressure: 34 mm Hg (S). Impressions: - TAVR prosthesis gradients are unchanged. Mild aortic insufficiency is seen, likely not perivalvular.  ______________  Echo 05/23/19 IMPRESSIONS  1. - TAVR: A 23 mm S3 is present in the aortic position. The Vmax and gradients are increased compared with the prior study (06/29/2018). Vmax 3.3 m/s, peak/mean gradients 43/22 mmHG, AVA 1.28 cm2. There is a mild to moderate degree of paravalvular vs central regurgitation (difficult to ascertain). The acceleration time is <100 ms, so I suspect the higher gradients possibly are related to slightly worsening regurgitaiton rather than obstruction.  2. The left ventricle has normal systolic function with an ejection fraction of 60-65%. The cavity size was normal. There is moderate concentric left ventricular hypertrophy. Left ventricular diastolic Doppler parameters are indeterminate. No evidence  of left ventricular regional wall motion abnormalities.  3. The right ventricle has low normal systolic function. The cavity was mildly enlarged. There is no increase in right ventricular wall thickness. Right ventricular systolic pressure is normal.  4. Left atrial size was severely dilated.  5. Right atrial size was severely dilated.  6. Trivial pericardial effusion is present.  7. The tricuspid valve is grossly normal. Tricuspid valve regurgitation is mild-moderate.  8. A 67mm an Wende Crease Sapien bioprosthetic aortic valve (TAVR) valve is present in the aortic position. Procedure Date: 05/30/2018 Echo findings shows no evidence of regurgitation or perivalvular leak of the aortic prosthesis.  9. The aorta is normal unless otherwise noted. 10. Aortic valve regurgitation is  mild to moderate by color flow Doppler. 11. The mitral valve is degenerative. Moderate thickening of the mitral valve leaflet. Moderate calcification of the mitral valve leaflet. There is moderate mitral annular calcification present. No evidence of mitral valve stenosis. 12. When compared to the prior study: Higher gradients noted across prosthetic aortic valve as noted above when compared with the prior study (06/29/2018).   Labs/Other Tests and Data Reviewed:    EKG:  No ECG reviewed.  Recent Labs: 05/31/2018: Magnesium 2.1 04/14/2019: ALT 34; BUN 23; Creatinine, Ser 1.06; Hemoglobin 14.5; Platelets 148; Potassium 3.9; Sodium 138   Recent Lipid Panel No results found for: CHOL, TRIG, HDL, CHOLHDL, LDLCALC, LDLDIRECT  Wt Readings from Last 3 Encounters:  03/07/19 179 lb 8 oz (81.4 kg)  11/15/18 180 lb (81.6 kg)  11/02/18 177 lb 7.5 oz (80.5 kg)     Objective:    Vital Signs:  BP 101/60   Pulse 60    Well nourished, well developed male in no acute distress.   ASSESSMENT & PLAN:  Severe AS s/p TAVR: echo today 05/23/19 showed EF 60-65%, normally functioning TAVR with mean gradient of 22 mm Hg (increased from 14 mm hg on 1 month echo) and mild-mod PVL (previously read as mild). Given increase in gradients and mild increase in severity of PVL, will get 6 month follow up echo. He has NYHA class I symptoms and doing quite well clinically although he has slowed down quite a bit over the past 6 months. SBE prophylaxis discussed; he has amoxicillin. He is on Eliquis 5mg  BID for afib.     Persistent atrial fibrillation:rate well controlled. Continue Eliquis for thromboembolic prophylaxis.   S/p BCW:[id number] by Dr Lovena Le   Dementia:has had a recent admission in July for dementia with behavioral disturbance (with suicidal ideation, agitation and violent behavior). He has slowed down considerably over the past 6 months due to no drive. I have encouraged him to try to stay active and  exercise.    COVID-19 Education: The signs and symptoms of COVID-19 were discussed with the patient and how to seek care for testing (follow up with PCP or arrange E-visit).  The importance of social distancing was discussed today.  Time:   Today, I have spent 20 minutes with the patient with telehealth technology discussing the above problems.     Medication Adjustments/Labs and Tests Ordered: Current medicines are reviewed at length with the patient today.  Concerns regarding medicines are outlined above.   Tests Ordered: Orders Placed This Encounter  Procedures  . ECHOCARDIOGRAM COMPLETE    Medication Changes: No orders of the defined types were placed in this encounter.   Disposition:  Follow up in 6 month(s) for repeat echo.   Signed, Angelena Form, PA-C  05/24/2019 1:50 PM    [facility]

## 2019-05-24 ENCOUNTER — Telehealth (INDEPENDENT_AMBULATORY_CARE_PROVIDER_SITE_OTHER): Payer: Medicare Other | Admitting: Physician Assistant

## 2019-05-24 VITALS — BP 101/60 | HR 60

## 2019-05-24 DIAGNOSIS — F0391 Unspecified dementia with behavioral disturbance: Secondary | ICD-10-CM

## 2019-05-24 DIAGNOSIS — Z952 Presence of prosthetic heart valve: Secondary | ICD-10-CM

## 2019-05-24 DIAGNOSIS — Z950 Presence of cardiac pacemaker: Secondary | ICD-10-CM

## 2019-05-24 DIAGNOSIS — I4819 Other persistent atrial fibrillation: Secondary | ICD-10-CM

## 2019-05-31 DIAGNOSIS — Z20828 Contact with and (suspected) exposure to other viral communicable diseases: Secondary | ICD-10-CM | POA: Diagnosis not present

## 2019-06-08 DIAGNOSIS — Z23 Encounter for immunization: Secondary | ICD-10-CM | POA: Diagnosis not present

## 2019-06-13 ENCOUNTER — Ambulatory Visit (INDEPENDENT_AMBULATORY_CARE_PROVIDER_SITE_OTHER): Payer: Medicare Other | Admitting: *Deleted

## 2019-06-13 DIAGNOSIS — I4819 Other persistent atrial fibrillation: Secondary | ICD-10-CM

## 2019-06-13 DIAGNOSIS — I440 Atrioventricular block, first degree: Secondary | ICD-10-CM

## 2019-06-15 DIAGNOSIS — Z20828 Contact with and (suspected) exposure to other viral communicable diseases: Secondary | ICD-10-CM | POA: Diagnosis not present

## 2019-06-17 LAB — CUP PACEART REMOTE DEVICE CHECK
Battery Remaining Longevity: 156 mo
Battery Remaining Percentage: 100 %
Brady Statistic RA Percent Paced: 0 %
Brady Statistic RV Percent Paced: 99 %
Date Time Interrogation Session: 20200909042200
Implantable Lead Implant Date: 20170510
Implantable Lead Implant Date: 20170510
Implantable Lead Location: 753859
Implantable Lead Location: 753860
Implantable Lead Model: 7741
Implantable Lead Model: 7742
Implantable Lead Serial Number: 729262
Implantable Lead Serial Number: 766721
Implantable Pulse Generator Implant Date: 20170510
Lead Channel Impedance Value: 680 Ohm
Lead Channel Impedance Value: 751 Ohm
Lead Channel Pacing Threshold Amplitude: 0.8 V
Lead Channel Pacing Threshold Pulse Width: 0.4 ms
Lead Channel Setting Pacing Amplitude: 1.3 V
Lead Channel Setting Pacing Pulse Width: 0.4 ms
Lead Channel Setting Sensing Sensitivity: 2.5 mV
Pulse Gen Serial Number: 748273

## 2019-06-18 DIAGNOSIS — L821 Other seborrheic keratosis: Secondary | ICD-10-CM | POA: Diagnosis not present

## 2019-06-18 DIAGNOSIS — L570 Actinic keratosis: Secondary | ICD-10-CM | POA: Diagnosis not present

## 2019-06-18 DIAGNOSIS — L820 Inflamed seborrheic keratosis: Secondary | ICD-10-CM | POA: Diagnosis not present

## 2019-06-28 NOTE — Progress Notes (Signed)
Remote pacemaker transmission.   

## 2019-07-16 ENCOUNTER — Other Ambulatory Visit (HOSPITAL_COMMUNITY): Payer: Medicare Other

## 2019-07-20 ENCOUNTER — Other Ambulatory Visit: Payer: Self-pay | Admitting: Physician Assistant

## 2019-08-23 ENCOUNTER — Other Ambulatory Visit: Payer: Self-pay

## 2019-08-23 MED ORDER — AMOXICILLIN 500 MG PO TABS: ORAL_TABLET | ORAL | 6 refills | Status: DC

## 2019-09-12 ENCOUNTER — Ambulatory Visit (INDEPENDENT_AMBULATORY_CARE_PROVIDER_SITE_OTHER): Payer: Medicare Other | Admitting: *Deleted

## 2019-09-12 DIAGNOSIS — Z950 Presence of cardiac pacemaker: Secondary | ICD-10-CM

## 2019-09-12 LAB — CUP PACEART REMOTE DEVICE CHECK
Battery Remaining Longevity: 144 mo
Battery Remaining Percentage: 100 %
Brady Statistic RA Percent Paced: 0 %
Brady Statistic RV Percent Paced: 99 %
Date Time Interrogation Session: 20201209002100
Implantable Lead Implant Date: 20170510
Implantable Lead Implant Date: 20170510
Implantable Lead Location: 753859
Implantable Lead Location: 753860
Implantable Lead Model: 7741
Implantable Lead Model: 7742
Implantable Lead Serial Number: 729262
Implantable Lead Serial Number: 766721
Implantable Pulse Generator Implant Date: 20170510
Lead Channel Impedance Value: 642 Ohm
Lead Channel Impedance Value: 743 Ohm
Lead Channel Pacing Threshold Amplitude: 0.8 V
Lead Channel Pacing Threshold Pulse Width: 0.4 ms
Lead Channel Setting Pacing Amplitude: 1.3 V
Lead Channel Setting Pacing Pulse Width: 0.4 ms
Lead Channel Setting Sensing Sensitivity: 2.5 mV
Pulse Gen Serial Number: 748273

## 2019-09-18 ENCOUNTER — Other Ambulatory Visit: Payer: Self-pay | Admitting: Neurology

## 2019-10-18 DIAGNOSIS — Z23 Encounter for immunization: Secondary | ICD-10-CM | POA: Diagnosis not present

## 2019-10-20 NOTE — Progress Notes (Signed)
PPM remote 

## 2019-10-24 DIAGNOSIS — N390 Urinary tract infection, site not specified: Secondary | ICD-10-CM | POA: Diagnosis not present

## 2019-10-24 DIAGNOSIS — R350 Frequency of micturition: Secondary | ICD-10-CM | POA: Diagnosis not present

## 2019-10-30 ENCOUNTER — Telehealth: Payer: Self-pay | Admitting: Physician Assistant

## 2019-10-30 ENCOUNTER — Ambulatory Visit: Payer: Medicare Other

## 2019-10-30 DIAGNOSIS — N410 Acute prostatitis: Secondary | ICD-10-CM | POA: Diagnosis not present

## 2019-10-30 DIAGNOSIS — N390 Urinary tract infection, site not specified: Secondary | ICD-10-CM | POA: Diagnosis not present

## 2019-10-30 DIAGNOSIS — N1832 Chronic kidney disease, stage 3b: Secondary | ICD-10-CM | POA: Diagnosis not present

## 2019-10-30 DIAGNOSIS — R358 Other polyuria: Secondary | ICD-10-CM | POA: Diagnosis not present

## 2019-10-30 NOTE — Telephone Encounter (Signed)
LGlenda, Wife of the patient will need to be present during the patient's appointment with [facility] on Friday 01-29. Please let the wife know if for any reason she would not be allowed to come

## 2019-11-01 NOTE — Progress Notes (Deleted)
Cardiology Office Note Date:  11/01/2019  Patient ID:  Jeffery Rogers, Jeffery Rogers [birth year], MRN [id number] PCP:  Jeffery Pretty, MD  Cardiologist:  Dr. Angelena Rogers Electrophysiologist: Dr. Lovena Rogers  ***refresh   Chief Complaint: *** annual visit  History of Present Illness: Jeffery Rogers is a 86 y.o. male with history of persistent AFib, CHB w/PPM, severe AS >> TAVR, non-obstructive CAD, dementia, hearing loss, vertigo, HTN.  He comes in today to be seen for Dr. Lovena Rogers, last seen by him Jan 2020.  AT that time doing well.  Using his stationary bike.  No changes were made to his programming or medicines.  *** symptoms *** bleeding, eliquis, labs *** meds *** labs, lipids... *** burden, ?permanent   Device information BSCi dual chamber PPM implanted 02/11/2016 Programmed VVIR   Past Medical History:  Diagnosis Date  . Arthritis    "right arm/shoulder" (02/10/2016)  . Complete heart block ([place]) 02/10/2016   a. s/p Emergency planning/management officer  . Goiter   . Hypercholesteremia   . Memory loss   . Nasal septal deviation   . Persistent atrial fibrillation   . Presence of permanent cardiac pacemaker   . 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 Severe aortic stenosis   . Tremor     Past Surgical History:  Procedure Laterality Date  . CATARACT EXTRACTION W/ INTRAOCULAR LENS  IMPLANT, BILATERAL Bilateral ~ 2000  . EP IMPLANTABLE DEVICE N/A 02/11/2016   Procedure: Pacemaker Implant;  Surgeon: Lav[place] Lance, MD;  Location: [place] [facility];  Service: Cardiovascular;  Laterality: N/A;  . RIGHT/LEFT HEART CATH AND CORONARY ANGIOGRAPHY N/A 05/03/2018   Procedure: RIGHT/LEFT HEART CATH AND CORONARY ANGIOGRAPHY;  Surgeon: Burnell Blanks, MD;  Location: [place] [facility];  Service: Cardiovascular;  Laterality: N/A;  . TEE WITHOUT CARDIOVERSION N/A 05/30/2018   Procedure: TRANSESOPHAGEAL ECHOCARDIOGRAM (TEE);  Surgeon: Burnell Blanks, MD;  Location: [place];  Service: Open Heart Surgery;  Laterality: N/A;  . TONSILLECTOMY  1930s  . TRANSCATHETER AORTIC VALVE REPLACEMENT, TRANSFEMORAL N/A 05/30/2018   Procedure: TRANSCATHETER AORTIC VALVE REPLACEMENT, TRANSFEMORAL using a 66mm Edwards Sapien 3 Aortic Valve;  Surgeon: Burnell Blanks, MD;  Location: [place];  Service: Open Heart Surgery;  Laterality: N/A;  . WISDOM TOOTH EXTRACTION  1990s    Current Outpatient Medications  Medication Sig Dispense Refill  . ALPRAZolam (XANAX) 0.25 MG tablet Take 0.25 mg by mouth 2 (two) times daily as needed (agitation).    Marland Kitchen amLODipine (NORVASC) 5 MG tablet Take 1 tablet by mouth daily. 90 tablet 2  . amoxicillin (AMOXIL) 500 MG tablet Take 4 tablets (2,000 mg) one hour prior to dental visits. 8 tablet 6  . apixaban (ELIQUIS) 5 MG TABS tablet Take 1 tablet (5 mg total) by mouth 2 (two) times daily. 180 tablet 3  . atorvastatin (LIPITOR) 40 MG tablet Take 1 tablet (40 mg total) by mouth daily. 90 tablet 2  . Cholecalciferol (VITAMIN D-3) 1000 units CAPS Take 1,000 Units by mouth daily.     Marland Kitchen donepezil (ARICEPT) 10 MG tablet Take 1 tablet (10 mg total) by mouth at bedtime. 90 tablet 3  . ipratropium (ATROVENT) 0.06 % nasal spray Place 1-2 sprays into both nostrils 4 (four) times daily as needed for rhinitis.    Marland Kitchen L-Methylfolate-Algae-B12-B6 (METANX) 3-90.314-2-35 MG CAPS Take 1 capsule by mouth 2 (two) times daily.  180 capsule 3  . memantine (NAMENDA) 10 MG tablet Take 1 tablet (10 mg total) by mouth 2 (two) times daily. 180 tablet 3  . OLANZapine (ZYPREXA) injection Inject 5 mg into the muscle 2 (two) times daily as needed for agitation. (Patient not taking: Reported on 05/23/2019) 1 each 0  . QUEtiapine (SEROQUEL) 25 MG tablet Take 1 tablet by mouth at bedtime. 90 tablet 5  . sertraline (ZOLOFT) 50 MG tablet Take 50 mg by mouth daily.     . vitamin C  (ASCORBIC ACID) 500 MG tablet Take 500 mg by mouth daily.     No current facility-administered medications for this visit.    Allergies:   Patient has no known allergies.   Social History:  The patient  reports that he has quit smoking. His smoking use included cigarettes. He has a 16.00 pack-year smoking history. He has never used smokeless tobacco. He reports that he does not drink alcohol or use drugs.   Family History:  The patient's family history includes CAD in his father; Irregular heart beat in an other family member; Stroke in his father, mother, and another family member.  ROS:  Please see the history of present illness.    All other systems are reviewed and otherwise negative.   PHYSICAL EXAM: *** VS:  There were no vitals taken for this visit. BMI: There is no height or weight on file to calculate BMI. Well nourished, well developed, in no acute distress  HEENT: normocephalic, atraumatic  Neck: no JVD, carotid bruits or masses Cardiac:  *** RRR; no significant murmurs, no rubs, or gallops Lungs:  *** CTA b/l, no wheezing, rhonchi or rales  Abd: soft, nontender MS: no deformity or *** atrophy Ext: *** no edema  Skin: warm and dry, no rash Neuro:  No gross deficits appreciated Psych: euthymic mood, full affect  *** PPM site is stable, no tethering or discomfort   EKG:  Done today and reviewed by myself shows ***  PPM interrogation done today and reviewed by myself: ***   05/23/2019: TTE IMPRESSIONS 1. - TAVR: A 23 mm S3 is present in the aortic position. The Vmax and gradients are increased compared with the prior study (06/29/2018). Vmax 3.3 m/s, peak/mean gradients 43/22 mmHG, AVA 1.28 cm2. There is a mild to moderate degree of paravalvular vs  central regurgitation (difficult to ascertain). The acceleration time is <100 ms, so I suspect the higher gradients possibly are related to slightly worsening regurgitaiton rather than obstruction.  2. The left ventricle has  normal systolic function with an ejection fraction of 60-65%. The cavity size was normal. There is moderate concentric left ventricular hypertrophy. Left ventricular diastolic Doppler parameters are indeterminate. No evidence  of left ventricular regional wall motion abnormalities.  3. The right ventricle has low normal systolic function. The cavity was mildly enlarged. There is no increase in right ventricular wall thickness. Right ventricular systolic pressure is normal.  4. Left atrial size was severely dilated.  5. Right atrial size was severely dilated.  6. Trivial pericardial effusion is present.  7. The tricuspid valve is grossly normal. Tricuspid valve regurgitation is mild-moderate.  8. A 86mm an Wende Crease Sapien bioprosthetic aortic valve (TAVR) valve is present in the aortic position. Procedure Date: 05/30/2018 Echo findings shows no evidence of regurgitation or perivalvular leak of the aortic prosthesis.  9. The aorta is normal unless otherwise noted. 10. Aortic valve regurgitation is mild to moderate by color flow Doppler. 11. The mitral  valve is degenerative. Moderate thickening of the mitral valve leaflet. Moderate calcification of the mitral valve leaflet. There is moderate mitral annular calcification present. No evidence of mitral valve stenosis. 12. When compared to the prior study: Higher gradients noted across prosthetic aortic valve as noted above when compared with the prior study (06/29/2018).   05/03/2018: LHC  Ost Ramus to Ramus lesion is 30% stenosed.  Prox Cx to Mid Cx lesion is 30% stenosed.  Ost LAD to Prox LAD lesion is 30% stenosed.  Prox LAD to Dist LAD lesion is 20% stenosed.  LV end diastolic pressure is normal.   1. Mild non-obstructive CAD 2. Severe aortic stenosis by echo criteria. By cath the peak to peak gradient across the valve is 7 mmHg and the mean gradient is 9.1 mmHg.     Recent Labs: 04/14/2019: ALT 34; BUN 23; Creatinine, Ser 1.06;  Hemoglobin 14.5; Platelets 148; Potassium 3.9; Sodium 138  No results found for requested labs within last 8760 hours.   CrCl cannot be calculated (Patient's most recent lab result is older than the maximum 21 days allowed.).   Wt Readings from Last 3 Encounters:  03/07/19 179 lb 8 oz (81.4 kg)  11/15/18 180 lb (81.6 kg)  11/02/18 177 lb 7.5 oz (80.5 kg)     Other studies reviewed: Additional studies/records reviewed today include: summarized above  ASSESSMENT AND PLAN:  1. PPM     ***  2. Persistent AFib     CHA2DS2Vasc is 3, on Eliquis      ***  3. VHD w/severe AS     S/p TAVR  4. HTN     ***    Disposition: F/u with ***  Current medicines are reviewed at length with the patient today.  The patient did not have any concerns regarding medicines.***  Signed, Tommye Standard, PA-C 11/01/2019 5:45 AM     [facility] [address] [place] [address] [phone] (office)  [phone] (fax)

## 2019-11-02 ENCOUNTER — Encounter: Payer: Medicare Other | Admitting: Physician Assistant

## 2019-11-06 DIAGNOSIS — N189 Chronic kidney disease, unspecified: Secondary | ICD-10-CM | POA: Diagnosis not present

## 2019-11-07 DIAGNOSIS — E7800 Pure hypercholesterolemia, unspecified: Secondary | ICD-10-CM | POA: Diagnosis not present

## 2019-11-07 DIAGNOSIS — N189 Chronic kidney disease, unspecified: Secondary | ICD-10-CM | POA: Diagnosis not present

## 2019-11-07 DIAGNOSIS — I10 Essential (primary) hypertension: Secondary | ICD-10-CM | POA: Diagnosis not present

## 2019-11-07 DIAGNOSIS — Z8679 Personal history of other diseases of the circulatory system: Secondary | ICD-10-CM | POA: Diagnosis not present

## 2019-11-12 NOTE — Progress Notes (Addendum)
Cardiology Office Note Date:  11/14/2019  Patient ID:  Jeffery, Rogers Jan 06, 1932, MRN [id number] PCP:  [name], MD  Cardiologist:  Dr. Angelena Form Electrophysiologist: Dr. Lovena Le    Chief Complaint: annual visit  History of Present Illness: Jeffery Rogers is a 86 y.o. male with history of persistent AFib, CHB w/PPM, severe AS >> TAVR, non-obstructive CAD, dementia, hearing loss, vertigo, HTN.  He comes in today to be seen for Dr. Lovena Le, last seen by him Jan 2020.  AT that time doing well.  Using his stationary bike.  No changes were made to his programming or medicines.  He comes today accompanied by his wofe, the patient with some memory deficit and hard of hearing.  He reports feeling well, denies any CP, palpitations.  He is generally pretty sedentary, does not exercise, but he and his wife deniy ant symptoms or difficulties with ADLs.  He denies any SOB or DOE, no symptoms of PND or orthopnea.  No near syncope or syncope.  He does on occasion have fleeting dizziness upon standing, and his wife mentions he has mentioned while seated a dizziness that he says things are spinning.  This is also reported as brief in duration.  No bleeding or signs of bleeding  Currently having some prostate issues that have been the concern of late, on his 2nd antibiotic now, pending a urology consultation.  Reports labs done routinely with his PMD, last about a month ago   Device information BSCi dual chamber PPM implanted 02/11/2016 Programmed VVIR   Past Medical History:  Diagnosis Date  . Arthritis    "right arm/shoulder" (02/10/2016)  . Complete heart block ([place]) 02/10/2016   a. s/p Emergency planning/management officer  . Goiter   . Hypercholesteremia   . Memory loss   . Nasal septal deviation   . Persistent atrial fibrillation ([place])   . Presence of permanent cardiac pacemaker   . S/P TAVR (transcatheter aortic valve replacement) 05/30/2018   23 mm Edwards Sapien 3 transcatheter heart valve  placed via percutaneous right transfemoral approach   . Severe aortic stenosis   . Tremor     Past Surgical History:  Procedure Laterality Date  . CATARACT EXTRACTION W/ INTRAOCULAR LENS  IMPLANT, BILATERAL Bilateral ~ 2000  . EP IMPLANTABLE DEVICE N/A 02/11/2016   Procedure: Pacemaker Implant;  Surgeon: Mitchell Lance, MD;  Location: [facility];  Service: Cardiovascular;  Laterality: N/A;  . RIGHT/LEFT HEART CATH AND CORONARY ANGIOGRAPHY N/A 05/03/2018   Procedure: RIGHT/LEFT HEART CATH AND CORONARY ANGIOGRAPHY;  Surgeon: Burnell Blanks, MD;  Location: [facility];  Service: Cardiovascular;  Laterality: N/A;  . TEE WITHOUT CARDIOVERSION N/A 05/30/2018   Procedure: TRANSESOPHAGEAL ECHOCARDIOGRAM (TEE);  Surgeon: Burnell Blanks, MD;  Location: [facility];  Service: Open Heart Surgery;  Laterality: N/A;  . TONSILLECTOMY  1930s  . TRANSCATHETER AORTIC VALVE REPLACEMENT, TRANSFEMORAL N/A 05/30/2018   Procedure: TRANSCATHETER AORTIC VALVE REPLACEMENT, TRANSFEMORAL using a 62mm Edwards Sapien 3 Aortic Valve;  Surgeon: Burnell Blanks, MD;  Location: [facility];  Service: Open Heart Surgery;  Laterality: N/A;  . WISDOM TOOTH EXTRACTION  1990s    Current Outpatient Medications  Medication Sig Dispense Refill  . ALPRAZolam (XANAX) 0.25 MG tablet Take 0.25 mg by mouth 2 (two) times daily as needed (agitation).    Marland Kitchen amLODipine (NORVASC) 5 MG tablet Take 1 tablet by mouth daily. 90 tablet 2  . amoxicillin (AMOXIL) 500 MG tablet Take 4 tablets (  2,000 mg) one hour prior to dental visits. 8 tablet 6  . apixaban (ELIQUIS) 5 MG TABS tablet Take 1 tablet (5 mg total) by mouth 2 (two) times daily. 180 tablet 3  . atorvastatin (LIPITOR) 40 MG tablet Take 1 tablet (40 mg total) by mouth daily. 90 tablet 2  . Cholecalciferol (VITAMIN D-3) 1000 units CAPS Take 1,000 Units by mouth daily.     Marland Kitchen donepezil (ARICEPT) 10 MG tablet Take 1 tablet (10 mg total) by mouth at bedtime. 90 tablet  3  . ipratropium (ATROVENT) 0.06 % nasal spray Place 1-2 sprays into both nostrils 4 (four) times daily as needed for rhinitis.    Marland Kitchen L-Methylfolate-Algae-B12-B6 (METANX) 3-90.314-2-35 MG CAPS Take 1 capsule by mouth 2 (two) times daily. 180 capsule 3  . levofloxacin (LEVAQUIN) 500 MG tablet Take 500 mg by mouth daily.    . Loratadine (CLARITIN PO) Take by mouth daily.    . memantine (NAMENDA) 10 MG tablet Take 1 tablet (10 mg total) by mouth 2 (two) times daily. 180 tablet 3  . QUEtiapine (SEROQUEL) 25 MG tablet Take 1 tablet by mouth at bedtime. (Patient taking differently: Every other day take 50 mg) 90 tablet 5  . sertraline (ZOLOFT) 50 MG tablet Take 50 mg by mouth daily.     . vitamin C (ASCORBIC ACID) 500 MG tablet Take 500 mg by mouth daily.     No current facility-administered medications for this visit.    Allergies:   Patient has no known allergies.   Social History:  The patient  reports that he has quit smoking. His smoking use included cigarettes. He has a 16.00 pack-year smoking history. He has never used smokeless tobacco. He reports that he does not drink alcohol or use drugs.   Family History:  The patient's family history includes CAD in his father; Irregular heart beat in an other family member; Stroke in his father, mother, and another family member.  ROS:  Please see the history of present illness.    All other systems are reviewed and otherwise negative.   PHYSICAL EXAM:  VS:  BP 125/74   Pulse 66   Ht 5\' 10"  (1.778 m)   Wt 176 lb (79.8 kg)   BMI 25.25 kg/m  BMI: Body mass index is 25.25 kg/m. Well nourished, well developed, in no acute distress  HEENT: normocephalic, atraumatic  Neck: no JVD, carotid bruits or masses Cardiac:  RRR; 2/6 SM, no rubs, or gallops Lungs:  CTA b/l, no wheezing, rhonchi or rales  Abd: soft, nontender MS: no deformity, age appropriate atrophy Ext: no edema  Skin: warm and dry, no rash Neuro:  No gross deficits appreciated Psych:  euthymic mood, full affect  PPM site is stable, no tethering or discomfort   EKG:  Not done today  PPM interrogation done today and reviewed by myself:  Battery and lead measurements are good Device dependent today One NSVT, 12/15/2018, 14 beats No other device observations HR historgram quite flat, primarily 60's   05/23/2019: TTE IMPRESSIONS 1. - TAVR: A 23 mm S3 is present in the aortic position. The Vmax and gradients are increased compared with the prior study (06/29/2018). Vmax 3.3 m/s, peak/mean gradients 43/22 mmHG, AVA 1.28 cm2. There is a mild to moderate degree of paravalvular vs  central regurgitation (difficult to ascertain). The acceleration time is <100 ms, so I suspect the higher gradients possibly are related to slightly worsening regurgitaiton rather than obstruction.  2. The left ventricle has  normal systolic function with an ejection fraction of 60-65%. The cavity size was normal. There is moderate concentric left ventricular hypertrophy. Left ventricular diastolic Doppler parameters are indeterminate. No evidence  of left ventricular regional wall motion abnormalities.  3. The right ventricle has low normal systolic function. The cavity was mildly enlarged. There is no increase in right ventricular wall thickness. Right ventricular systolic pressure is normal.  4. Left atrial size was severely dilated.  5. Right atrial size was severely dilated.  6. Trivial pericardial effusion is present.  7. The tricuspid valve is grossly normal. Tricuspid valve regurgitation is mild-moderate.  8. A 80mm an Wende Crease Sapien bioprosthetic aortic valve (TAVR) valve is present in the aortic position. Procedure Date: 05/30/2018 Echo findings shows no evidence of regurgitation or perivalvular leak of the aortic prosthesis.  9. The aorta is normal unless otherwise noted. 10. Aortic valve regurgitation is mild to moderate by color flow Doppler. 11. The mitral valve is degenerative.  Moderate thickening of the mitral valve leaflet. Moderate calcification of the mitral valve leaflet. There is moderate mitral annular calcification present. No evidence of mitral valve stenosis. 12. When compared to the prior study: Higher gradients noted across prosthetic aortic valve as noted above when compared with the prior study (06/29/2018).   05/03/2018: LHC  Ost Ramus to Ramus lesion is 30% stenosed.  Prox Cx to Mid Cx lesion is 30% stenosed.  Ost LAD to Prox LAD lesion is 30% stenosed.  Prox LAD to Dist LAD lesion is 20% stenosed.  LV end diastolic pressure is normal.   1. Mild non-obstructive CAD 2. Severe aortic stenosis by echo criteria. By cath the peak to peak gradient across the valve is 7 mmHg and the mean gradient is 9.1 mmHg.     Recent Labs: 04/14/2019: ALT 34; BUN 23; Creatinine, Ser 1.06; Hemoglobin 14.5; Platelets 148; Potassium 3.9; Sodium 138  No results found for requested labs within last 8760 hours.   CrCl cannot be calculated (Patient's most recent lab result is older than the maximum 21 days allowed.).   Wt Readings from Last 3 Encounters:  11/14/19 176 lb (79.8 kg)  03/07/19 179 lb 8 oz (81.4 kg)  11/15/18 180 lb (81.6 kg)     Other studies reviewed: Additional studies/records reviewed today include: summarized above  ASSESSMENT AND PLAN:  1. PPM     Intact function     No progamming changes made  2. Persistent AFib     CHA2DS2Vasc is 3, on Eliquis, appropriately dosed        3. VHD w/severe AS     S/p TAVR     Scheduled for echo already, will be due to see Dr. Angelena Form in march  4. HTN     Looks OK, no changes  5. Dizzines     A component of orthostatic dizziness, we discussed adequate hydration and to avoid standing quickly     Also has a dizziness that sounds like vertigo, a known diagnosis for him    Disposition: F/u with Q 3 mo remotes, and in-clinic with EP in a year, sooner if needed    Current medicines are reviewed  at length with the patient today.  The patient did not have any concerns regarding medicines.  Venetia Night, PA-C 11/14/2019 11:07 AM     [facility] [address] [place] [address] [phone] (office)  [phone] (fax)

## 2019-11-14 ENCOUNTER — Encounter: Payer: Self-pay | Admitting: Physician Assistant

## 2019-11-14 ENCOUNTER — Ambulatory Visit (INDEPENDENT_AMBULATORY_CARE_PROVIDER_SITE_OTHER): Payer: Medicare Other | Admitting: Physician Assistant

## 2019-11-14 ENCOUNTER — Other Ambulatory Visit: Payer: Self-pay

## 2019-11-14 VITALS — BP 125/74 | HR 66 | Ht 70.0 in | Wt 176.0 lb

## 2019-11-14 DIAGNOSIS — I10 Essential (primary) hypertension: Secondary | ICD-10-CM

## 2019-11-14 DIAGNOSIS — I4819 Other persistent atrial fibrillation: Secondary | ICD-10-CM

## 2019-11-14 DIAGNOSIS — Z952 Presence of prosthetic heart valve: Secondary | ICD-10-CM | POA: Diagnosis not present

## 2019-11-14 DIAGNOSIS — R42 Dizziness and giddiness: Secondary | ICD-10-CM | POA: Diagnosis not present

## 2019-11-14 DIAGNOSIS — Z950 Presence of cardiac pacemaker: Secondary | ICD-10-CM

## 2019-11-14 NOTE — Patient Instructions (Addendum)
Medication Instructions:   Your physician recommends that you continue on your current medications as directed. Please refer to the Current Medication list given to you today.  *If you need a refill on your cardiac medications before your next appointment, please call your pharmacy*  Lab Work:  [place]   If you have labs (blood work) drawn today and your tests are completely normal, you will receive your results only by: Marland Kitchen MyChart Message (if you have MyChart) OR . A paper copy in the mail If you have any lab test that is abnormal or we need to change your treatment, we will call you to review the results.  Testing/Procedures: NONE ORDERED  TODAY   Follow-Up: At [facility], you and your health needs are our priority.  As part of our continuing mission to provide you with exceptional heart care, we have created designated Provider Care Teams.  These Care Teams include your primary Cardiologist (physician) and Advanced Practice Providers (APPs -  Physician Assistants and Nurse Practitioners) who all work together to provide you with the care you need, when you need it.  Your next appointment:   1 year(s)  The format for your next appointment:   In Person  Provider:   You may see Dr. Lovena Le  or one of the following Advanced Practice Providers on your designated Care Team:    Chanetta Marshall, NP  Tommye Standard, PA-C  Legrand Como "Oda [place]ts, [place]   Other Instructions

## 2019-11-15 DIAGNOSIS — Z23 Encounter for immunization: Secondary | ICD-10-CM | POA: Diagnosis not present

## 2019-11-19 ENCOUNTER — Ambulatory Visit (INDEPENDENT_AMBULATORY_CARE_PROVIDER_SITE_OTHER): Payer: Medicare Other | Admitting: Neurology

## 2019-11-19 ENCOUNTER — Encounter: Payer: Self-pay | Admitting: Neurology

## 2019-11-19 ENCOUNTER — Other Ambulatory Visit: Payer: Self-pay

## 2019-11-19 VITALS — BP 114/56 | HR 59 | Temp 96.8°F | Ht 70.0 in | Wt 180.2 lb

## 2019-11-19 DIAGNOSIS — F0281 Dementia in other diseases classified elsewhere with behavioral disturbance: Secondary | ICD-10-CM | POA: Diagnosis not present

## 2019-11-19 DIAGNOSIS — F02818 Dementia in other diseases classified elsewhere, unspecified severity, with other behavioral disturbance: Secondary | ICD-10-CM

## 2019-11-19 DIAGNOSIS — G301 Alzheimer's disease with late onset: Secondary | ICD-10-CM

## 2019-11-19 NOTE — Progress Notes (Signed)
PATIENT: Jeffery Rogers DOB: Jul 07, 1932  Chief Complaint  Patient presents with  . Follow-up    Rm Hall. wife.  He stated doing ok,  HOH (bilateral aides).  . [facility]     HISTORICAL  Jeffery Rogers is 86 years old right-handed male, accompanied by [name] and [name] for evaluation of memory loss, tremor, dizziness, he is referred by his primary care physician [name], initial evaluation was on January 14 2017.  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  There was no significant family history of memory loss  Family history of bilateral hands tremor, gradually getting worse, no gait abnormality, his daughter also suffered bilateral hands tremor,  Dizziness since January 2018, no dizziness, lightheadedness in the sitting down or lying down position, most noticeable when he get up quickly from seated position, overnight sleeping, he has transient lightheadedness, he denies significant low back pain, no gait abnormality, no lower extremity paresthesia notice, but on today's examination was noted to have mild length dependent sensory changes, suggestive of peripheral neuropathy  He also complains of new onset intermittent binocular double vision during his vestibular rehabilitation only  UPDATE May 11 2017: Laboratory evaluations showed mild elevated A1c 5.8, otherwise negative or normal RPR, B12, TSH, CBC, acetylcholine receptor antibody, ESR C-reactive protein, positive ANA, elevated CPK  546  Electrodiagnostic study today showed length dependent peripheral neuropathy, in addition, there is evidence of mild chronic neuropathic changes involving bilateral L4 nerve roots. He has intermittent low back pain, but denies low back pain now, denies significant gait abnormality.  The also personally reviewed CT head without contrast, generalized atrophy, mild supratentorium small vessel disease.  He continues to complain short term memroy loss, wife reported agitation occasionally.   UPDATE Nov 14 2017: He is accompanied by his family at today's clinic visit, he is overall doing very well, status 25 out of 30, continue to exercise regularly, mild increased bilateral hands tremor.  UPDATE Nov 15 2018: He had severe aortic stenosis, had transcatheter aortic valve replacement on May 30, 2018, recovered very well, on Eliquis treatment  He continue have slow decline memory loss, Mini-Mental Status Examination 23 out of 30 today, taking Aricept, Namenda, Seroquel 25 mg every night, sleep 10 to 12 hours every night  UPDATE June 4th 2020: He is accompanied by his wife at visit, he had fall on June 2nd 2020, he went out for a walk around 10:15 AM, without warning signs, his lower extremity give out underneath him, he denied loss of consciousness, had a bruise at his forehead, knees, his wife was able to get a phone call from his neighbor, pick him up about 5 minutes later, he was at his baseline,  He was checked at emergency room, I personally reviewed CT head, mild diffuse cortical atrophy, mild chronic ischemic white matter disease, no acute intracranial abnormality  CT of cervical spine showed multilevel degenerative disc disease, no acute abnormality  He fell few other times in the past, due to misjudgment of his steps,  UPDATE Nov 19 2019: He is accompanied by his wife at today's clinical visit, he experiences episode of sudden worsening in July 2020, to the point of being violent,  had involuntary admission to [facility] for few days, now back to home, is overall stable over the past few months, he is now on higher dose of Seroquel 25 mg 2 tablets every night, Zoloft 50 mg daily, also Aricept 10 mg daily, Namenda 10 mg twice a day, he sleeps well, has good appetite, still trying to be active, continue have slow worsening memory loss, today's Mini-Mental Status Examination is 22 out of 30  Laboratory evaluations in July 2020, negative alcohol, salicylate level less than 7, UDS was positive for benzodiazepine, no evidence of UTI, CMP showed mild elevated glucose 106 otherwise normal, CBC showed slight decrease platelet which is at his baseline Chest x-ray showed no acute cardiopulmonary disease,  REVIEW OF SYSTEMS: Full 14 system review of systems performed and notable only for as above  All rest review of the system were negative ALLERGIES: No Known Allergies  HOME MEDICATIONS: Current Outpatient Medications  Medication Sig Dispense Refill  . ALPRAZolam (XANAX) 0.25 MG tablet Take 0.25 mg by mouth 2 (two) times daily as needed (agitation).    Marland Kitchen amLODipine (NORVASC) 5 MG tablet Take 1 tablet by mouth daily. 90 tablet 2  . apixaban (ELIQUIS) 5 MG TABS tablet Take 1 tablet (5 mg total) by mouth 2 (two) times daily. 180 tablet 3  . atorvastatin (LIPITOR) 40 MG tablet Take 1 tablet (40 mg total) by mouth daily. 90 tablet 2  . Cholecalciferol (VITAMIN D-3) 1000 units CAPS Take 1,000 Units by mouth daily.     Marland Kitchen donepezil (ARICEPT) 10 MG tablet Take 1 tablet (10 mg total) by mouth at bedtime. 90 tablet 3  . ipratropium (ATROVENT) 0.06 % nasal spray Place 1-2 sprays into both nostrils 4 (four) times daily as needed for rhinitis.    Marland Kitchen L-Methylfolate-Algae-B12-B6 (METANX) 3-90.314-2-35 MG CAPS Take 1 capsule by mouth 2 (two) times daily. 180 capsule 3  . levofloxacin (LEVAQUIN) 500 MG tablet Take 500 mg by mouth daily.    . Loratadine (CLARITIN PO) Take by mouth daily.    .  memantine (NAMENDA) 10 MG tablet Take 1 tablet (10 mg total) by mouth 2 (two) times daily. 180 tablet 3  . Probiotic Product (PROBIOTIC DAILY PO) Take by mouth. While on antibiotics    . QUEtiapine (SEROQUEL) 25 MG tablet Take 1 tablet by mouth at bedtime. (Patient taking differently: Every other day take 50 mg) 90 tablet 5  . sertraline (ZOLOFT) 50 MG tablet Take 50 mg by mouth daily.     . vitamin C (ASCORBIC ACID) 500 MG tablet Take 500 mg by mouth daily.    Marland Kitchen amoxicillin (AMOXIL) 500 MG tablet Take 4 tablets (2,000 mg) one hour prior to dental visits. (Patient not taking: Reported on 11/19/2019) 8 tablet 6   No current facility-administered medications for this visit.    PAST MEDICAL HISTORY: Past Medical History:  Diagnosis Date  . Arthritis    "right arm/shoulder" (02/10/2016)  . Complete heart block ([place]) 02/10/2016   a. s/p Emergency planning/management officer  . Goiter   . Hypercholesteremia   . Memory loss   . Nasal septal deviation   . Persistent atrial fibrillation ([place])   . Presence of permanent cardiac pacemaker   . S/P TAVR (transcatheter aortic valve replacement) 05/30/2018   23 mm Edwards Sapien 3 transcatheter heart valve placed  via percutaneous right transfemoral approach   . Severe aortic stenosis   . Tremor     PAST SURGICAL HISTORY: Past Surgical History:  Procedure Laterality Date  . CATARACT EXTRACTION W/ INTRAOCULAR LENS  IMPLANT, BILATERAL Bilateral ~ 2000  . EP IMPLANTABLE DEVICE N/A 02/11/2016   Procedure: Pacemaker Implant;  Surgeon: Tarman Lance, MD;  Location: [place];  Service: Cardiovascular;  Laterality: N/A;  . RIGHT/LEFT HEART CATH AND CORONARY ANGIOGRAPHY N/A 05/03/2018   Procedure: RIGHT/LEFT HEART CATH AND CORONARY ANGIOGRAPHY;  Surgeon: Burnell Blanks, MD;  Location: [place] [facility];  Service: Cardiovascular;  Laterality: N/A;  . TEE WITHOUT CARDIOVERSION N/A 05/30/2018   Procedure: TRANSESOPHAGEAL ECHOCARDIOGRAM (TEE);  Surgeon:  Burnell Blanks, MD;  Location: [facility];  Service: Open Heart Surgery;  Laterality: N/A;  . TONSILLECTOMY  1930s  . TRANSCATHETER AORTIC VALVE REPLACEMENT, TRANSFEMORAL N/A 05/30/2018   Procedure: TRANSCATHETER AORTIC VALVE REPLACEMENT, TRANSFEMORAL using a 62m Edwards Sapien 3 Aortic Valve;  Surgeon: MBurnell Blanks MD;  [place]  Service: Open Heart Surgery;  Laterality: N/A;  . WISDOM TOOTH EXTRACTION  1990s    FAMILY HISTORY: Family History  Problem Relation Age of Onset  . Stroke Other   . Irregular heart beat Other   . Stroke Mother   . Stroke Father   . CAD Father        MI    SOCIAL HISTORY:  Social History   Socioeconomic History  . Marital status: Married    Spouse name: Not on file  . Number of children: 2  . Years of education: Bachelors  . Highest education level: Not on file  Occupational History  . Occupation: Retired EChief Financial Officer Tobacco Use  . Smoking status: Former Smoker    Packs/day: 1.00    Years: 16.00    Pack years: 16.00    Types: Cigarettes  . Smokeless tobacco: Never Used  . Tobacco comment: Quit smoking cigarettes in 1971  Substance and Sexual Activity  . Alcohol use: Never    Alcohol/week: 2.0 standard drinks    Types: 1 Cans of beer, 1 Shots of liquor per week  . Drug use: No  . Sexual activity: Yes  Other Topics Concern  . Not on file  Social History Narrative   Lives at home with his wife.   Right-handed.   1 cup coffee per day.  Occasional Coke.   Social Determinants of Health   Financial Resource Strain:   . Difficulty of Paying Living Expenses: Not on file  Food Insecurity:   . Worried About RCharity fundraiserin the Last Year: Not on file  . Ran Out of Food in the Last Year: Not on file  Transportation Needs:   . Lack of Transportation (Medical): Not on file  . Lack of Transportation (Non-Medical): Not on file  Physical Activity:   . Days of Exercise per Week: Not on file  . Minutes of Exercise per  Session: Not on file  Stress:   . Feeling of Stress : Not on file  Social Connections:   . Frequency of Communication with Friends and Family: Not on file  . Frequency of Social Gatherings with Friends and Family: Not on file  . Attends Religious Services: Not on file  . Active Member of Clubs or Organizations: Not on file  . Attends CArchivistMeetings: Not on file  . Marital Status: Not on file  Intimate Partner Violence:   .  Fear of Current or Ex-Partner: Not on file  . Emotionally Abused: Not on file  . Physically Abused: Not on file  . Sexually Abused: Not on file     PHYSICAL EXAM   Vitals:   11/19/19 1054  BP: (!) 114/56  Pulse: (!) 59  Temp: (!) 96.8 F (36 C)  Weight: 180 lb 3.2 oz (81.7 kg)  Height: 5' 10"  (1.778 m)    Not recorded      Body mass index is 25.86 kg/m.  PHYSICAL EXAMNIATION:  Gen: NAD, conversant, well nourised, well groomed                     Cardiovascular: Regular rate rhythm, no peripheral edema, warm, nontender. Eyes: Conjunctivae clear without exudates or hemorrhage Neck: Supple, no carotid bruits. Pulmonary: Clear to auscultation bilaterally   NEUROLOGICAL EXAM:  MMSE - Mini Mental State Exam 11/19/2019 11/15/2018 11/14/2017  Orientation to time 1 1 3   Orientation to Place 4 5 5   Registration 3 3 3   Attention/ Calculation 5 5 5   Recall 0 0 0  Language- name 2 objects 2 2 2   Language- repeat 1 1 1   Language- follow 3 step command 3 3 3   Language- read & follow direction 1 1 1   Write a sentence 1 1 1   Copy design 1 1 1   Total score 22 23 25   Animal naming 7  MENTAL STATUS: Speech:    Speech is normal; fluent and spontaneous with normal comprehension.  Cognition:     Orientation to time, place and person     Normal recent and remote memory     Normal Attention span and concentration     Normal Language, naming, repeating,spontaneous speech     Fund of knowledge   CRANIAL NERVES: CN II: Visual fields are full  to confrontation.Pupils are small equal and reactive to light. CN III, IV, VI: extraocular movement are normal. No ptosis. Cover and uncover test showed right inferior/exophoria, left superior/exophoria. CN V: Facial sensation is intact to light touch CN VII: Face is symmetric with normal eye closure and smile. CN VIII: Hard of hearing, wearing hearing aid CN IX, X: Palate elevates symmetrically. Phonation is normal. CN XI: Head turning and shoulder shrug are intact  MOTOR: He has mild bilateral postural tremor, no significant rigidity, bradykinesia, no weakness  REFLEXES: Reflexes are 1 and symmetric at the biceps, triceps, knees, absent at ankles. Plantar responses are flexor.  SENSORY: Length dependent decreased to light touch, pinprick, vibratory sensation to ankle level, preserved toe proprioception  COORDINATION: Rapid alternating movements and fine finger movements are intact. There is no dysmetria on finger-to-nose and heel-knee-shin.    GAIT/STANCE: Mild difficulty getting up from seated position with arms crossed, steady gait, Romberg is absent.   DIAGNOSTIC DATA (LABS, IMAGING, TESTING) - I reviewed patient records, labs, notes, testing and imaging myself where available.   ASSESSMENT AND PLAN  Jeffery Rogers is a 86 y.o. male   Dementia  Slow decline, Mini-Mental Status Examination is 22 out of 30 today   Consistent with central nervous system degenerative disorder, most likely Alzheimer's disease.  Laboratory evaluation showed no treatable etiology  Continue Namenda 10 mg twice a day, Aricept 10 mg daily  Continue Seroquel 50 mg every night, Zoloft 50 mg daily  Moderate exercise         Marcial Pacas, M.D. Ph.D.  [facility] [address], [place], [place] [address] Ph: [phone]  Fax: [phone]  CC: [name], MD

## 2019-11-20 ENCOUNTER — Ambulatory Visit (HOSPITAL_COMMUNITY): Payer: Medicare Other | Attending: Cardiovascular Disease

## 2019-11-20 ENCOUNTER — Other Ambulatory Visit: Payer: Self-pay

## 2019-11-20 DIAGNOSIS — Z952 Presence of prosthetic heart valve: Secondary | ICD-10-CM | POA: Diagnosis not present

## 2019-11-29 DIAGNOSIS — K8020 Calculus of gallbladder without cholecystitis without obstruction: Secondary | ICD-10-CM | POA: Diagnosis not present

## 2019-11-29 DIAGNOSIS — N1832 Chronic kidney disease, stage 3b: Secondary | ICD-10-CM | POA: Diagnosis not present

## 2019-11-30 DIAGNOSIS — N400 Enlarged prostate without lower urinary tract symptoms: Secondary | ICD-10-CM | POA: Diagnosis not present

## 2019-11-30 DIAGNOSIS — N410 Acute prostatitis: Secondary | ICD-10-CM | POA: Diagnosis not present

## 2019-12-12 ENCOUNTER — Ambulatory Visit (INDEPENDENT_AMBULATORY_CARE_PROVIDER_SITE_OTHER): Payer: Medicare Other | Admitting: *Deleted

## 2019-12-12 DIAGNOSIS — Z950 Presence of cardiac pacemaker: Secondary | ICD-10-CM

## 2019-12-12 LAB — CUP PACEART REMOTE DEVICE CHECK
Battery Remaining Longevity: 144 mo
Battery Remaining Percentage: 100 %
Brady Statistic RA Percent Paced: 0 %
Brady Statistic RV Percent Paced: 100 %
Date Time Interrogation Session: 20210310002000
Implantable Lead Implant Date: 20170510
Implantable Lead Implant Date: 20170510
Implantable Lead Location: 753859
Implantable Lead Location: 753860
Implantable Lead Model: 7741
Implantable Lead Model: 7742
Implantable Lead Serial Number: 729262
Implantable Lead Serial Number: 766721
Implantable Pulse Generator Implant Date: 20170510
Lead Channel Impedance Value: 660 Ohm
Lead Channel Impedance Value: 736 Ohm
Lead Channel Pacing Threshold Amplitude: 0.8 V
Lead Channel Pacing Threshold Pulse Width: 0.4 ms
Lead Channel Setting Pacing Amplitude: 1.3 V
Lead Channel Setting Pacing Pulse Width: 0.4 ms
Lead Channel Setting Sensing Sensitivity: 2.5 mV
Pulse Gen Serial Number: 748273

## 2019-12-12 NOTE — Progress Notes (Signed)
PPM Remote  

## 2020-02-18 ENCOUNTER — Telehealth: Payer: Self-pay | Admitting: Neurology

## 2020-02-18 DIAGNOSIS — H9193 Unspecified hearing loss, bilateral: Secondary | ICD-10-CM | POA: Diagnosis not present

## 2020-02-18 DIAGNOSIS — Z952 Presence of prosthetic heart valve: Secondary | ICD-10-CM | POA: Diagnosis not present

## 2020-02-18 DIAGNOSIS — E7800 Pure hypercholesterolemia, unspecified: Secondary | ICD-10-CM | POA: Diagnosis not present

## 2020-02-18 DIAGNOSIS — Z20822 Contact with and (suspected) exposure to covid-19: Secondary | ICD-10-CM | POA: Diagnosis not present

## 2020-02-18 DIAGNOSIS — I4891 Unspecified atrial fibrillation: Secondary | ICD-10-CM | POA: Diagnosis not present

## 2020-02-18 DIAGNOSIS — F320 Major depressive disorder, single episode, mild: Secondary | ICD-10-CM | POA: Diagnosis not present

## 2020-02-18 DIAGNOSIS — F329 Major depressive disorder, single episode, unspecified: Secondary | ICD-10-CM | POA: Diagnosis not present

## 2020-02-18 DIAGNOSIS — F0281 Dementia in other diseases classified elsewhere with behavioral disturbance: Secondary | ICD-10-CM | POA: Diagnosis not present

## 2020-02-18 DIAGNOSIS — I10 Essential (primary) hypertension: Secondary | ICD-10-CM | POA: Diagnosis not present

## 2020-02-18 DIAGNOSIS — R45851 Suicidal ideations: Secondary | ICD-10-CM | POA: Diagnosis not present

## 2020-02-18 DIAGNOSIS — R600 Localized edema: Secondary | ICD-10-CM | POA: Diagnosis not present

## 2020-02-18 DIAGNOSIS — F0391 Unspecified dementia with behavioral disturbance: Secondary | ICD-10-CM | POA: Diagnosis not present

## 2020-02-18 DIAGNOSIS — Z7901 Long term (current) use of anticoagulants: Secondary | ICD-10-CM | POA: Diagnosis not present

## 2020-02-18 DIAGNOSIS — Z87891 Personal history of nicotine dependence: Secondary | ICD-10-CM | POA: Diagnosis not present

## 2020-02-18 DIAGNOSIS — Z950 Presence of cardiac pacemaker: Secondary | ICD-10-CM | POA: Diagnosis not present

## 2020-02-18 DIAGNOSIS — G309 Alzheimer's disease, unspecified: Secondary | ICD-10-CM | POA: Diagnosis not present

## 2020-02-18 DIAGNOSIS — Z79899 Other long term (current) drug therapy: Secondary | ICD-10-CM | POA: Diagnosis not present

## 2020-02-18 NOTE — Telephone Encounter (Signed)
I returned the call to the patient's wife and left a message. We were able to get him scheduled with Butler Denmark, NP on Wednesday, 02/20/20, at 11:15am. He will need to be here for check-in at 10:45am.   I have requested his wife call back to change this appt, if it is not convenient for them.

## 2020-02-18 NOTE — Telephone Encounter (Signed)
Pt wife called to inform pt has been having difficulty with dementia for the past week and would like to know if his medication can be increased or what recommendations MD has.

## 2020-02-18 NOTE — Telephone Encounter (Signed)
Received the following message from the patient's wife, requesting a visit in the office:  Mat's is becoming increasingly confused. He is often angry. He is very assertive verbally. Sometimes will not go to bed, sleeps in a recliner in street clothes in the sunroom. He awakens totally confused.  This type behavior has been manifest three times this week. I would like his medication re-evaluated; maybe an office visit also.

## 2020-02-19 DIAGNOSIS — G309 Alzheimer's disease, unspecified: Secondary | ICD-10-CM | POA: Diagnosis not present

## 2020-02-19 DIAGNOSIS — F329 Major depressive disorder, single episode, unspecified: Secondary | ICD-10-CM | POA: Diagnosis not present

## 2020-02-20 ENCOUNTER — Ambulatory Visit: Payer: Self-pay | Admitting: Neurology

## 2020-02-20 MED ORDER — MEMANTINE HCL 10 MG PO TABS
10.00 | ORAL_TABLET | ORAL | Status: DC
Start: 2020-02-19 — End: 2020-02-20

## 2020-02-20 MED ORDER — AMLODIPINE BESYLATE 5 MG PO TABS
5.00 | ORAL_TABLET | ORAL | Status: DC
Start: 2020-02-20 — End: 2020-02-20

## 2020-02-20 MED ORDER — QUETIAPINE FUMARATE 25 MG PO TABS
25.00 | ORAL_TABLET | ORAL | Status: DC
Start: ? — End: 2020-02-20

## 2020-02-20 MED ORDER — SERTRALINE HCL 50 MG PO TABS
100.00 | ORAL_TABLET | ORAL | Status: DC
Start: 2020-02-20 — End: 2020-02-20

## 2020-02-20 MED ORDER — FA-PYRIDOXINE-CYANOCOBALAMIN 2.5-25-2 MG PO TABS
1.00 | ORAL_TABLET | ORAL | Status: DC
Start: 2020-02-20 — End: 2020-02-20

## 2020-02-20 MED ORDER — DONEPEZIL HCL 10 MG PO TABS
10.00 | ORAL_TABLET | ORAL | Status: DC
Start: ? — End: 2020-02-20

## 2020-02-20 MED ORDER — ACETAMINOPHEN 325 MG PO TABS
650.00 | ORAL_TABLET | ORAL | Status: DC
Start: ? — End: 2020-02-20

## 2020-02-20 MED ORDER — APIXABAN 5 MG PO TABS
5.00 | ORAL_TABLET | ORAL | Status: DC
Start: 2020-02-19 — End: 2020-02-20

## 2020-02-20 MED ORDER — ASPIRIN 81 MG PO TBEC
81.00 | DELAYED_RELEASE_TABLET | ORAL | Status: DC
Start: 2020-02-20 — End: 2020-02-20

## 2020-02-20 MED ORDER — GENERIC EXTERNAL MEDICATION
7.50 | Status: DC
Start: 2020-02-19 — End: 2020-02-20

## 2020-02-20 MED ORDER — ATORVASTATIN CALCIUM 40 MG PO TABS
40.00 | ORAL_TABLET | ORAL | Status: DC
Start: 2020-02-19 — End: 2020-02-20
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
## 2020-02-21 ENCOUNTER — Telehealth: Payer: Self-pay | Admitting: Neurology

## 2020-02-21 DIAGNOSIS — F02818 Dementia in other diseases classified elsewhere, unspecified severity, with other behavioral disturbance: Secondary | ICD-10-CM

## 2020-02-21 NOTE — Telephone Encounter (Signed)
I called patient, left message, I have put in a referral for psychiatric Dr. Norma Fredrickson,

## 2020-02-28 DIAGNOSIS — E7800 Pure hypercholesterolemia, unspecified: Secondary | ICD-10-CM | POA: Diagnosis not present

## 2020-02-28 DIAGNOSIS — N189 Chronic kidney disease, unspecified: Secondary | ICD-10-CM | POA: Diagnosis not present

## 2020-02-28 DIAGNOSIS — I10 Essential (primary) hypertension: Secondary | ICD-10-CM | POA: Diagnosis not present

## 2020-03-04 DIAGNOSIS — J309 Allergic rhinitis, unspecified: Secondary | ICD-10-CM | POA: Diagnosis not present

## 2020-03-04 DIAGNOSIS — K439 Ventral hernia without obstruction or gangrene: Secondary | ICD-10-CM | POA: Diagnosis not present

## 2020-03-04 DIAGNOSIS — F0391 Unspecified dementia with behavioral disturbance: Secondary | ICD-10-CM | POA: Diagnosis not present

## 2020-03-04 DIAGNOSIS — I480 Paroxysmal atrial fibrillation: Secondary | ICD-10-CM | POA: Diagnosis not present

## 2020-03-04 DIAGNOSIS — R251 Tremor, unspecified: Secondary | ICD-10-CM | POA: Diagnosis not present

## 2020-03-04 DIAGNOSIS — H9193 Unspecified hearing loss, bilateral: Secondary | ICD-10-CM | POA: Diagnosis not present

## 2020-03-04 DIAGNOSIS — I10 Essential (primary) hypertension: Secondary | ICD-10-CM | POA: Diagnosis not present

## 2020-03-04 DIAGNOSIS — Z0000 Encounter for general adult medical examination without abnormal findings: Secondary | ICD-10-CM | POA: Diagnosis not present

## 2020-03-04 DIAGNOSIS — R748 Abnormal levels of other serum enzymes: Secondary | ICD-10-CM | POA: Diagnosis not present

## 2020-03-04 DIAGNOSIS — D696 Thrombocytopenia, unspecified: Secondary | ICD-10-CM | POA: Diagnosis not present

## 2020-03-12 ENCOUNTER — Ambulatory Visit (INDEPENDENT_AMBULATORY_CARE_PROVIDER_SITE_OTHER): Payer: Medicare Other | Admitting: *Deleted

## 2020-03-12 DIAGNOSIS — R001 Bradycardia, unspecified: Secondary | ICD-10-CM | POA: Diagnosis not present

## 2020-03-12 LAB — CUP PACEART REMOTE DEVICE CHECK
Battery Remaining Longevity: 138 mo
Battery Remaining Percentage: 100 %
Brady Statistic RA Percent Paced: 0 %
Brady Statistic RV Percent Paced: 100 %
Date Time Interrogation Session: 20210609002000
Implantable Lead Implant Date: 20170510
Implantable Lead Implant Date: 20170510
Implantable Lead Location: 753859
Implantable Lead Location: 753860
Implantable Lead Model: 7741
Implantable Lead Model: 7742
Implantable Lead Serial Number: 729262
Implantable Lead Serial Number: 766721
Implantable Pulse Generator Implant Date: 20170510
Lead Channel Impedance Value: 654 Ohm
Lead Channel Impedance Value: 743 Ohm
Lead Channel Pacing Threshold Amplitude: 0.8 V
Lead Channel Pacing Threshold Pulse Width: 0.4 ms
Lead Channel Setting Pacing Amplitude: 1.3 V
Lead Channel Setting Pacing Pulse Width: 0.4 ms
Lead Channel Setting Sensing Sensitivity: 2.5 mV
Pulse Gen Serial Number: 748273

## 2020-03-13 NOTE — Progress Notes (Signed)
Remote pacemaker transmission.   

## 2020-03-15 IMAGING — DX DG CHEST 1V PORT
1 series · 1 of 1 positions shown · non-contrast
Comparison: Chest x-ray dated May 22, 2018.

CLINICAL DATA: TAVR.

EXAM:
PORTABLE CHEST 1 VIEW

[chest ap]
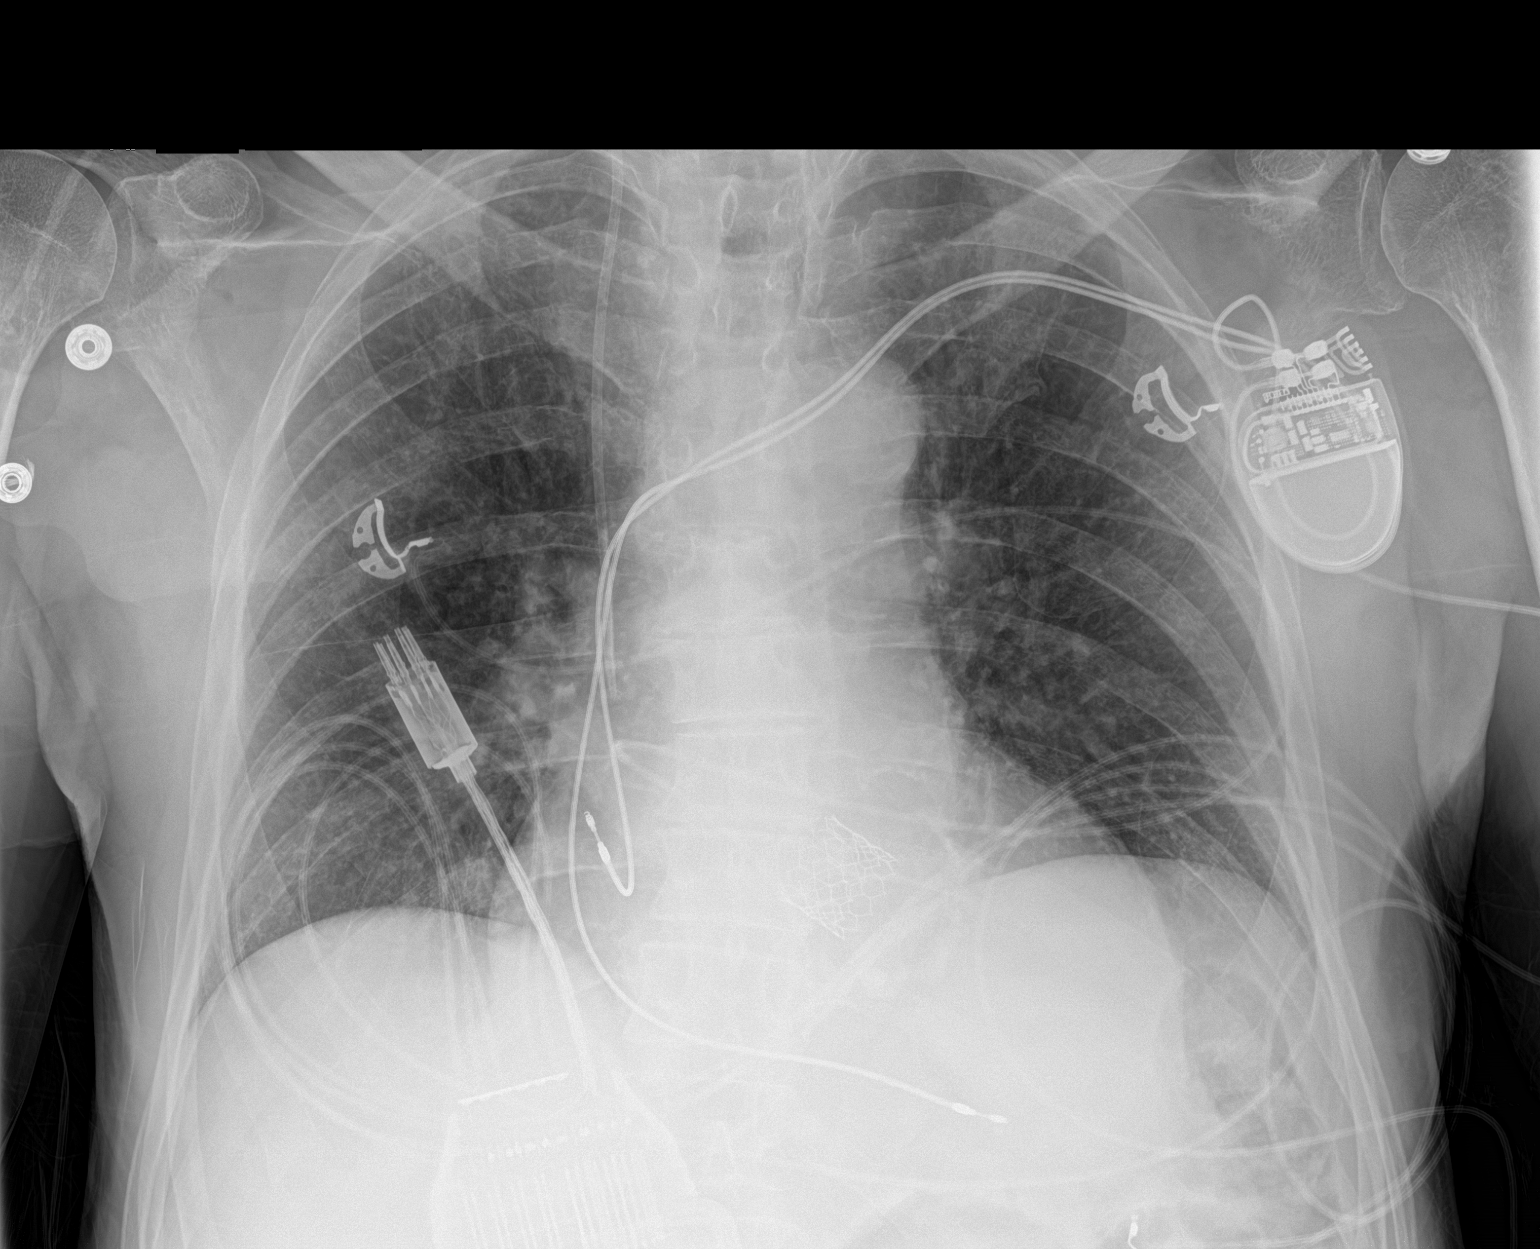

[1 of 1 positions shown; findings below may reference images not displayed]

FINDINGS: New right internal jugular central venous catheter with the tip at
the cavoatrial junction. Interval TAVR. Unchanged left chest wall
pacemaker. The heart size and mediastinal contours are within normal
limits. Normal pulmonary vascularity. No focal consolidation,
pleural effusion, or pneumothorax. No acute osseous abnormality.
IMPRESSION: Interval TAVR.  No active disease.

## 2020-03-16 ENCOUNTER — Other Ambulatory Visit: Payer: Self-pay | Admitting: Neurology

## 2020-03-19 DIAGNOSIS — H6123 Impacted cerumen, bilateral: Secondary | ICD-10-CM | POA: Diagnosis not present

## 2020-03-21 ENCOUNTER — Telehealth: Payer: Self-pay | Admitting: Neurology

## 2020-03-21 NOTE — Telephone Encounter (Signed)
I spoke to the patient's wife. She had question about his medications. She also had a call into his PCP for another reason and they were also able to answer her med questions. She appreciated our call back.

## 2020-03-21 NOTE — Telephone Encounter (Signed)
[name] called needing to discuss the pt's medications with RN or provider. Please advise.

## 2020-03-25 ENCOUNTER — Other Ambulatory Visit: Payer: Self-pay | Admitting: *Deleted

## 2020-03-25 MED ORDER — METANX 3-90.314-2-35 MG PO CAPS: 1.0000 | ORAL_CAPSULE | Freq: Two times a day (BID) | ORAL | 3 refills | Status: DC

## 2020-04-01 DIAGNOSIS — S5002XA Contusion of left elbow, initial encounter: Secondary | ICD-10-CM | POA: Diagnosis not present

## 2020-04-15 ENCOUNTER — Other Ambulatory Visit: Payer: Self-pay | Admitting: Physician Assistant

## 2020-05-08 DIAGNOSIS — R338 Other retention of urine: Secondary | ICD-10-CM | POA: Diagnosis not present

## 2020-05-08 DIAGNOSIS — N139 Obstructive and reflux uropathy, unspecified: Secondary | ICD-10-CM | POA: Diagnosis not present

## 2020-05-12 DIAGNOSIS — R338 Other retention of urine: Secondary | ICD-10-CM | POA: Diagnosis not present

## 2020-05-12 DIAGNOSIS — N312 Flaccid neuropathic bladder, not elsewhere classified: Secondary | ICD-10-CM | POA: Diagnosis not present

## 2020-06-04 DIAGNOSIS — F0391 Unspecified dementia with behavioral disturbance: Secondary | ICD-10-CM | POA: Diagnosis not present

## 2020-06-11 ENCOUNTER — Ambulatory Visit (INDEPENDENT_AMBULATORY_CARE_PROVIDER_SITE_OTHER): Payer: Medicare Other | Admitting: *Deleted

## 2020-06-11 DIAGNOSIS — R001 Bradycardia, unspecified: Secondary | ICD-10-CM | POA: Diagnosis not present

## 2020-06-11 LAB — CUP PACEART REMOTE DEVICE CHECK
Battery Remaining Longevity: 138 mo
Battery Remaining Percentage: 100 %
Brady Statistic RA Percent Paced: 0 %
Brady Statistic RV Percent Paced: 100 %
Date Time Interrogation Session: 20210908002200
Implantable Lead Implant Date: 20170510
Implantable Lead Implant Date: 20170510
Implantable Lead Location: 753859
Implantable Lead Location: 753860
Implantable Lead Model: 7741
Implantable Lead Model: 7742
Implantable Lead Serial Number: 729262
Implantable Lead Serial Number: 766721
Implantable Pulse Generator Implant Date: 20170510
Lead Channel Impedance Value: 660 Ohm
Lead Channel Impedance Value: 762 Ohm
Lead Channel Pacing Threshold Amplitude: 0.9 V
Lead Channel Pacing Threshold Pulse Width: 0.4 ms
Lead Channel Setting Pacing Amplitude: 1.3 V
Lead Channel Setting Pacing Pulse Width: 0.4 ms
Lead Channel Setting Sensing Sensitivity: 2.5 mV
Pulse Gen Serial Number: 748273

## 2020-06-12 NOTE — Progress Notes (Signed)
Remote pacemaker transmission.   

## 2020-07-02 DIAGNOSIS — F0391 Unspecified dementia with behavioral disturbance: Secondary | ICD-10-CM | POA: Diagnosis not present

## 2020-07-02 DIAGNOSIS — Z23 Encounter for immunization: Secondary | ICD-10-CM | POA: Diagnosis not present

## 2020-07-14 DIAGNOSIS — H6123 Impacted cerumen, bilateral: Secondary | ICD-10-CM | POA: Diagnosis not present

## 2020-08-04 DIAGNOSIS — Z23 Encounter for immunization: Secondary | ICD-10-CM | POA: Diagnosis not present

## 2020-08-19 DIAGNOSIS — I950 Idiopathic hypotension: Secondary | ICD-10-CM | POA: Diagnosis not present

## 2020-08-19 DIAGNOSIS — F0391 Unspecified dementia with behavioral disturbance: Secondary | ICD-10-CM | POA: Diagnosis not present

## 2020-09-02 DIAGNOSIS — I10 Essential (primary) hypertension: Secondary | ICD-10-CM | POA: Diagnosis not present

## 2020-09-02 DIAGNOSIS — I959 Hypotension, unspecified: Secondary | ICD-10-CM | POA: Diagnosis not present

## 2020-09-03 ENCOUNTER — Telehealth: Payer: Self-pay | Admitting: Neurology

## 2020-09-03 ENCOUNTER — Encounter: Payer: Self-pay | Admitting: *Deleted

## 2020-09-03 DIAGNOSIS — F0281 Dementia in other diseases classified elsewhere with behavioral disturbance: Secondary | ICD-10-CM

## 2020-09-03 DIAGNOSIS — F02818 Dementia in other diseases classified elsewhere, unspecified severity, with other behavioral disturbance: Secondary | ICD-10-CM

## 2020-09-03 MED ORDER — ARIPIPRAZOLE 10 MG PO TABS
10.0000 mg | ORAL_TABLET | Freq: Every day | ORAL | 6 refills | Status: DC
Start: 2020-09-03 — End: 2020-11-26

## 2020-09-03 MED ORDER — QUETIAPINE FUMARATE 50 MG PO TABS: 100.0000 mg | ORAL_TABLET | Freq: Every day | ORAL | 11 refills | Status: DC
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
## 2020-09-03 NOTE — Addendum Note (Signed)
Addended by: Marcial Pacas on: 09/03/2020 09:50 AM   Modules accepted: Orders

## 2020-09-04 DIAGNOSIS — I10 Essential (primary) hypertension: Secondary | ICD-10-CM | POA: Diagnosis not present

## 2020-09-04 DIAGNOSIS — F0391 Unspecified dementia with behavioral disturbance: Secondary | ICD-10-CM | POA: Diagnosis not present

## 2020-09-05 ENCOUNTER — Telehealth: Payer: Self-pay | Admitting: Neurology

## 2020-09-05 NOTE — Telephone Encounter (Signed)
[name], Gerell Fortson (on [place]) called, clarify dosage for QUEtiapine (SEROQUEL) 50 MG tablet; If I tablet or 2 tablet in the evening.

## 2020-09-08 NOTE — Telephone Encounter (Signed)
I called the patient's daughter (on [place]) and reviewed the changes discussed with Dr. Krista Blue on 09/03/20.  From Dr. Rhea Belton call: I have made following changes 1.  Add on Abilify 10 mg every morning 2.  Seroquel 50 mg 2 tablets, 1 every evening, 1 before bedtime 3.  Xanax 0.25 mg as needed 4.  Keep Zoloft 100 mg daily 5.  May consider stop Namenda, Aricept, vitamins, if patient resists taking medications.  The patient's daughter verbalized full understanding of the plan. She is helping her mother get his medications straight.

## 2020-09-10 ENCOUNTER — Ambulatory Visit (INDEPENDENT_AMBULATORY_CARE_PROVIDER_SITE_OTHER): Payer: Medicare Other

## 2020-09-10 DIAGNOSIS — R001 Bradycardia, unspecified: Secondary | ICD-10-CM

## 2020-09-11 LAB — CUP PACEART REMOTE DEVICE CHECK
Battery Remaining Longevity: 138 mo
Battery Remaining Percentage: 100 %
Brady Statistic RA Percent Paced: 0 %
Brady Statistic RV Percent Paced: 100 %
Date Time Interrogation Session: 20211208002100
Implantable Lead Implant Date: 20170510
Implantable Lead Implant Date: 20170510
Implantable Lead Location: 753859
Implantable Lead Location: 753860
Implantable Lead Model: 7741
Implantable Lead Model: 7742
Implantable Lead Serial Number: 729262
Implantable Lead Serial Number: 766721
Implantable Pulse Generator Implant Date: 20170510
Lead Channel Impedance Value: 653 Ohm
Lead Channel Impedance Value: 755 Ohm
Lead Channel Pacing Threshold Amplitude: 0.8 V
Lead Channel Pacing Threshold Pulse Width: 0.4 ms
Lead Channel Setting Pacing Amplitude: 1.3 V
Lead Channel Setting Pacing Pulse Width: 0.4 ms
Lead Channel Setting Sensing Sensitivity: 2.5 mV
Pulse Gen Serial Number: 748273

## 2020-09-15 ENCOUNTER — Telehealth: Payer: Self-pay | Admitting: Neurology

## 2020-09-15 NOTE — Telephone Encounter (Signed)
I have talked with his daughter,  With the medication change, he is overall improved, taking Seroquel 50 mg at 3 PM, 1 before bedtime, the only change on September 03, 2020, was added on Abilify 10 mg every morning  Daughter denied significant sedation, the medication change did not make a difference in his sundowning, evening time agitations,  He has been on Zoloft 100 mg daily from his primary care physician Dr. Shelia Media for a while.  Daughter also reported that he has low blood pressure issues,  I have suggested check blood pressure often, hold of Norvasc 5 mg if blood pressure is less than 120/70, keep Flomax only 0.4 mg every night, rule out possible infection,

## 2020-09-15 NOTE — Telephone Encounter (Signed)
I returned the call to the patient's daughter. States she sent a detailed mychart message for Dr. Krista Blue to review. It included a detailed medication schedule. Says it is okay for Dr. Krista Blue to send a mychart message back with the clarification.

## 2020-09-15 NOTE — Telephone Encounter (Signed)
Daughter(on DPR) is asking for a call from [facility] to discuss pt's medications.  She states there is a concern for a possible drug interaction. Please call

## 2020-09-23 NOTE — Progress Notes (Signed)
Remote pacemaker transmission.   

## 2020-10-07 ENCOUNTER — Telehealth: Payer: Self-pay | Admitting: Internal Medicine

## 2020-10-07 NOTE — Telephone Encounter (Signed)
Pt c/o swelling: STAT is pt has developed SOB within 24 hours  1) How much weight have you gained and in what time span? Not sure if he has gained weight  2) If swelling, where is the swelling located? ankles  3) Are you currently taking a fluid pill? no  4) Are you currently SOB? no  5) Do you have a log of your daily weights (if so, list)? no  6) Have you gained 3 pounds in a day or 5 pounds in a week? no  7) Have you traveled recently? no  Patient's wife states in he last couple of days they have noticed the patient has some swelling in his ankles. She states he does not weigh himself so she does not know if he has gained any weight. She states he was also taken off amlodipine so she's not sure if that is correlated. She states the patient does get a bit dizzy when he stands up, but she's not sure if this is normal. Patient is scheduled 10/16/2020 with Renee and states she does not need a call back.

## 2020-10-11 ENCOUNTER — Encounter (HOSPITAL_COMMUNITY): Payer: Self-pay | Admitting: *Deleted

## 2020-10-11 ENCOUNTER — Emergency Department (HOSPITAL_COMMUNITY)
Admission: EM | Admit: 2020-10-11 | Discharge: 2020-10-11 | Disposition: A | Discharge status: Home / Self Care | Payer: Medicare Other | Attending: Emergency Medicine | Admitting: Emergency Medicine

## 2020-10-11 ENCOUNTER — Other Ambulatory Visit: Payer: Self-pay

## 2020-10-11 DIAGNOSIS — Z950 Presence of cardiac pacemaker: Secondary | ICD-10-CM | POA: Insufficient documentation

## 2020-10-11 DIAGNOSIS — Z7901 Long term (current) use of anticoagulants: Secondary | ICD-10-CM | POA: Insufficient documentation

## 2020-10-11 DIAGNOSIS — R339 Retention of urine, unspecified: Secondary | ICD-10-CM | POA: Insufficient documentation

## 2020-10-11 DIAGNOSIS — F0390 Unspecified dementia without behavioral disturbance: Secondary | ICD-10-CM | POA: Insufficient documentation

## 2020-10-11 DIAGNOSIS — Z87891 Personal history of nicotine dependence: Secondary | ICD-10-CM | POA: Insufficient documentation

## 2020-10-11 DIAGNOSIS — I4819 Other persistent atrial fibrillation: Secondary | ICD-10-CM | POA: Insufficient documentation

## 2020-10-11 LAB — I-STAT CHEM 8, ED
BUN: 19 mg/dL (ref 8–23)
Calcium, Ion: 1.24 mmol/L (ref 1.15–1.40)
Chloride: 100 mmol/L (ref 98–111)
Creatinine, Ser: 1.1 mg/dL (ref 0.61–1.24)
Glucose, Bld: 81 mg/dL (ref 70–99)
HCT: 41 % (ref 39.0–52.0)
Hemoglobin: 13.9 g/dL (ref 13.0–17.0)
Potassium: 4.4 mmol/L (ref 3.5–5.1)
Sodium: 137 mmol/L (ref 135–145)
TCO2: 27 mmol/L (ref 22–32)

## 2020-10-11 LAB — URINALYSIS, ROUTINE W REFLEX MICROSCOPIC
Bilirubin Urine: NEGATIVE
Glucose, UA: NEGATIVE mg/dL
Hgb urine dipstick: NEGATIVE
Ketones, ur: NEGATIVE mg/dL
Leukocytes,Ua: NEGATIVE
Nitrite: NEGATIVE
Protein, ur: NEGATIVE mg/dL
Specific Gravity, Urine: 1.011 (ref 1.005–1.030)
pH: 6 (ref 5.0–8.0)

## 2020-10-11 NOTE — ED Triage Notes (Signed)
Pt states he has not been able to urinate since earlier this morning, pt did have some lower abd pain earlier.

## 2020-10-11 NOTE — Discharge Instructions (Signed)
Contact a health care provider if: You have uncomfortable bladder contractions that you cannot control (spasms) or you leak urine with the spasms. Get help right away if: You have chills or fever. You have blood in your urine. You have a catheter and: Your catheter stops draining urine. Your catheter falls out. 

## 2020-10-11 NOTE — ED Provider Notes (Signed)
[facility] DEPT Provider Note   CSN: [id number] Arrival date & time: 10/11/20  1218     History Chief Complaint  Patient presents with  . Urinary Retention  . Abdominal Pain    Jeffery Rogers is a 87 y.o. male who presents emergency department for urinary retention.  He has dementia and the history is given by the patient's wife who is at bedside.  She states that he began complaining of urgency to urinate with severe pain.  He has a history of one episode of previous urinary retention.  He is followed by Dr. Gloriann Loan.  They spoke with Dr. Alyson Ingles this morning who is on-call for urology service who told him to come here.  Patient had a bladder scan in triage which showed about 500 mL which was drained with a Foley catheter.  He has been otherwise well and in his normal state of health prior to this morning.  No fevers, chills, nausea, vomiting.  Patient is lying comfortably in triage setting.  HPI     Past Medical History:  Diagnosis Date  . Arthritis    "right arm/shoulder" (02/10/2016)  . Complete heart block ([place]) 02/10/2016   a. s/p Emergency planning/management officer  . Goiter   . Hypercholesteremia   . Memory loss   . Nasal septal deviation   . Persistent atrial fibrillation ([place])   . Presence of permanent cardiac pacemaker   . 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 Severe aortic stenosis   . Tremor     Patient Active Problem List   Diagnosis Date Noted  . Late onset Alzheimer's disease with behavioral disturbance ([place]) 11/19/2019  . Dementia with behavioral disturbance ([place])   . Fall 03/07/2019  . Noise effect on both inner ears 10/06/2018  . Memory loss   . Presence of permanent cardiac pacemaker   . Persistent atrial fibrillation ([place])   . S/P TAVR (transcatheter aortic valve replacement) 05/30/2018  . Severe aortic  stenosis   . Tendinopathy of left rotator cuff 12/19/2017  . Bilateral impacted cerumen 11/25/2017  . Presbycusis of both ears 11/25/2017  . Vasomotor rhinitis 11/25/2017  . Dizziness 05/15/2017  . Chronic right maxillary sinusitis 03/30/2017  . Rhinitis 03/30/2017  . Elevated CPK 02/18/2017  . Sinusitis 02/18/2017  . Peripheral neuropathy 02/18/2017  . Chronic maxillary sinusitis 02/18/2017  . Mild cognitive impairment 01/12/2017  . Essential tremor 01/12/2017  . Orthostatic dizziness 01/12/2017  . Diplopia 01/12/2017  . First degree atrioventricular block 03/29/2013  . Hypercholesteremia   . Goiter   . Nasal septal deviation   . Snoring     Past Surgical History:  Procedure Laterality Date  . CATARACT EXTRACTION W/ INTRAOCULAR LENS  IMPLANT, BILATERAL Bilateral ~ 2000  . EP IMPLANTABLE DEVICE N/A 02/11/2016   Procedure: Pacemaker Implant;  Surgeon: Lafitte Lance, MD;  Location: [facility];  Service: Cardiovascular;  Laterality: N/A;  . RIGHT/LEFT HEART CATH AND CORONARY ANGIOGRAPHY N/A 05/03/2018   Procedure: RIGHT/LEFT HEART CATH AND CORONARY ANGIOGRAPHY;  Surgeon: Burnell Blanks, MD;  Location: [facility];  Service: Cardiovascular;  Laterality: N/A;  . TEE WITHOUT CARDIOVERSION N/A 05/30/2018   Procedure: TRANSESOPHAGEAL ECHOCARDIOGRAM (TEE);  Surgeon: Burnell Blanks, MD;  Location: [facility];  Service: Open Heart Surgery;  Laterality: N/A;  . TONSILLECTOMY  1930s  . TRANSCATHETER AORTIC VALVE REPLACEMENT, TRANSFEMORAL  N/A 05/30/2018   Procedure: TRANSCATHETER AORTIC VALVE REPLACEMENT, TRANSFEMORAL using a 53mm Edwards Sapien 3 Aortic Valve;  Surgeon: Burnell Blanks, MD;  Location: [facility];  Service: Open Heart Surgery;  Laterality: N/A;  . WISDOM TOOTH EXTRACTION  1990s       Family History  Problem Relation Age of Onset  . Stroke Other   . Irregular heart beat Other   . Stroke Mother   . Stroke Father   . CAD Father        MI     Social History   Tobacco Use  . Smoking status: Former Smoker    Packs/day: 1.00    Years: 16.00    Pack years: 16.00    Types: Cigarettes  . Smokeless tobacco: Never Used  . Tobacco comment: Quit smoking cigarettes in 1971  Vaping Use  . Vaping Use: Never used  Substance Use Topics  . Alcohol use: Never    Alcohol/week: 2.0 standard drinks    Types: 1 Cans of beer, 1 Shots of liquor per week  . Drug use: No    Home Medications Prior to Admission medications   Medication Sig Start Date End Date Taking? Authorizing Provider  ALPRAZolam (XANAX) 0.25 MG tablet Take 0.25 mg by mouth in the morning, at noon, and at bedtime.    [provider]  amLODipine (NORVASC) 5 MG tablet Take 1 tablet by mouth daily. 04/15/20   Eileen Stanford, PA-C  amoxicillin (AMOXIL) 500 MG tablet Take 4 tablets (2,000 mg) one hour prior to dental visits. Patient not taking: Reported on 11/19/2019 08/23/19   Eileen Stanford, PA-C  apixaban (ELIQUIS) 5 MG TABS tablet Take 1 tablet (5 mg total) by mouth 2 (two) times daily. 02/08/17   [place] Lance, MD  ARIPiprazole (ABILIFY) 10 MG tablet Take 1 tablet (10 mg total) by mouth daily. 09/03/20   Marcial Pacas, MD  atorvastatin (LIPITOR) 40 MG tablet Take 1 tablet (40 mg total) by mouth daily. 05/24/17   Simms Lance, MD  Cholecalciferol (VITAMIN D-3) 1000 units CAPS Take 1,000 Units by mouth daily.     [provider]  donepezil (ARICEPT) 10 MG tablet Take 1 tablet by mouth at bedtime. 03/17/20   Marcial Pacas, MD  ipratropium (ATROVENT) 0.06 % nasal spray Place 1-2 sprays into both nostrils 4 (four) times daily as needed for rhinitis.    [provider]  L-Methylfolate-Algae-B12-B6 Glade Stanford) 3-90.314-2-35 MG CAPS Take 1 capsule by mouth 2 (two) times daily. 03/25/20   Marcial Pacas, MD  levofloxacin (LEVAQUIN) 500 MG tablet Take 500 mg by mouth daily. 10/30/19   [provider]  Loratadine (CLARITIN PO) Take by mouth daily.     [provider]  memantine (NAMENDA) 10 MG tablet Take 1 tablet by mouth twice daily. 03/17/20   Marcial Pacas, MD  Probiotic Product (PROBIOTIC DAILY PO) Take by mouth. While on antibiotics    [provider]  QUEtiapine (SEROQUEL) 50 MG tablet Take 2 tablets (100 mg total) by mouth at bedtime. 09/03/20   Marcial Pacas, MD  sertraline (ZOLOFT) 50 MG tablet Take 100 mg by mouth daily.     [provider]  vitamin C (ASCORBIC ACID) 500 MG tablet Take 500 mg by mouth daily.    [provider]    Allergies    Patient has no known allergies.  Review of Systems   Review of Systems Ten systems reviewed and are negative for acute change, except as  noted in the HPI.   Physical Exam Updated Vital Signs BP (!) 155/80 (BP Location: Left Arm)   Pulse 62   Temp 97.9 F (36.6 C) (Oral)   Resp 16   SpO2 98%   Physical Exam Vitals and nursing note reviewed.  Constitutional:      General: He is not in acute distress.    Appearance: He is well-developed and well-nourished. He is not diaphoretic.  HENT:     Head: Normocephalic and atraumatic.  Eyes:     General: No scleral icterus.    Conjunctiva/sclera: Conjunctivae normal.  Cardiovascular:     Rate and Rhythm: Normal rate and regular rhythm.     Heart sounds: Normal heart sounds.  Pulmonary:     Effort: Pulmonary effort is normal. No respiratory distress.     Breath sounds: Normal breath sounds.  Abdominal:     Palpations: Abdomen is soft.     Tenderness: There is no abdominal tenderness.  Musculoskeletal:        General: No edema.     Cervical back: Normal range of motion and neck supple.  Skin:    General: Skin is warm and dry.  Neurological:     Mental Status: He is alert.  Psychiatric:        Behavior: Behavior normal.     ED Results / Procedures / Treatments   Labs (all labs ordered are listed, but only abnormal results are displayed) Labs Reviewed  URINALYSIS, ROUTINE W REFLEX MICROSCOPIC   I-STAT CHEM 8, ED    EKG None  Radiology No results found.  Procedures Procedures (including critical care time)  Medications Ordered in ED Medications - No data to display  ED Course  I have reviewed the triage vital signs and the nursing notes.  Pertinent labs & imaging results that were available during my care of the patient were reviewed by me and considered in my medical decision making (see chart for details).    MDM Rules/Calculators/A&P                          87 year old male here with acute urinary retention.  Patient had a Foley catheter placed with drainage of a 500 mL of urine.  I reviewed urinalysis which is negative for infection.  Chem-8 shows no elevation the patient's creatinine level.  Case discussed with Dr. Alyson Ingles.  He has that the patient follow closely with Dr. Gloriann Loan this coming week for catheter removal.  He appears otherwise appropriate for discharge at this time with catheter and leg bag in place. Final Clinical Impression(s) / ED Diagnoses Final diagnoses:  None    Rx / DC Orders ED Discharge Orders    None       Margarita Mail, PA-C 10/11/20 1505    Lacretia Leigh, MD 10/12/20 [phone]

## 2020-10-15 NOTE — Progress Notes (Addendum)
Cardiology Office Note Date:  10/15/2020  Patient ID:  Jeffery Rogers, Jeffery Rogers 05/02/1932, MRN [id number] PCP:  [name], MD  Cardiologist:  Dr. Angelena Form Electrophysiologist: Dr. Lovena Le    Chief Complaint:  annual device visit  History of Present Illness: Jeffery Rogers is a 87 y.o. male with history of persistent AFib, CHB w/PPM, severe AS >> TAVR, non-obstructive CAD, dementia, hearing loss, vertigo, HTN.  He comes in today to be seen for Dr. Lovena Le, last seen by him Jan 2020.  AT that time doing well.  Using his stationary bike.  No changes were made to his programming or medicines.   I saw him Feb 2021 He comes today accompanied by his wofe, the patient with some memory deficit and hard of hearing.  He reports feeling well, denies any CP, palpitations.  He is generally pretty sedentary, does not exercise, but he and his wife deniy ant symptoms or difficulties with ADLs.  He denies any SOB or DOE, no symptoms of PND or orthopnea.  No near syncope or syncope.  He does on occasion have fleeting dizziness upon standing, and his wife mentions he has mentioned while seated a dizziness that he says things are spinning.  This is also reported as brief in duration. No bleeding or signs of bleeding Currently having some prostate issues that have been the concern of late, on his 2nd antibiotic now, pending a urology consultation. Reports labs done routinely with his PMD, last about a month ago  He was due to see Dr. Angelena Form had an echo scheduled No changes were made Echo was completed, Structural heart PA with recommendations for annual echos  TODAY He comes today accompanied by his wife. He is hard of hearing and historically relied heavily on reading lips as well, though when speaking louder and near him he did ok. He has recently had significant issues with urinary retention and recently had to have a foley placed (still has) and is following with urology. Otherwise doing OK, weather  permitting he walks a mile a day with good exertional capacity. Denies any CP, palpitations or cardiac awareness. He has not had any further dizziness of any kind. No near syncoe or syncope. Whe the foley was 1st placed he had some blood noted in the urine they were told that was expected and has cleared, denies bleeding ro signs of bleeding otherwise The patient's wife states the ankle swelling resolved after foley was placed  Device information BSCi dual chamber PPM implanted 02/11/2016 Programmed VVIR   Past Medical History:  Diagnosis Date  . Arthritis    "right arm/shoulder" (02/10/2016)  . Complete heart block ([place]) 02/10/2016   a. s/p Emergency planning/management officer  . Goiter   . Hypercholesteremia   . Memory loss   . Nasal septal deviation   . Persistent atrial fibrillation ([place])   . Presence of permanent cardiac pacemaker   . 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 Severe aortic stenosis   . Tremor     Past Surgical History:  Procedure Laterality Date  . CATARACT EXTRACTION W/ INTRAOCULAR LENS  IMPLANT, BILATERAL Bilateral ~ 2000  . EP IMPLANTABLE DEVICE N/A 02/11/2016   Procedure: Pacemaker Implant;  Surgeon: Balin Lance, MD;  Location: [facility];  Service: Cardiovascular;  Laterality: N/A;  . RIGHT/LEFT HEART CATH AND CORONARY ANGIOGRAPHY N/A 05/03/2018   Procedure: RIGHT/LEFT  HEART CATH AND CORONARY ANGIOGRAPHY;  Surgeon: Burnell Blanks, MD;  Location: [facility];  Service: Cardiovascular;  Laterality: N/A;  . TEE WITHOUT CARDIOVERSION N/A 05/30/2018   Procedure: TRANSESOPHAGEAL ECHOCARDIOGRAM (TEE);  Surgeon: Burnell Blanks, MD;  Location: [facility];  Service: Open Heart Surgery;  Laterality: N/A;  . TONSILLECTOMY  1930s  . TRANSCATHETER AORTIC VALVE REPLACEMENT, TRANSFEMORAL N/A 05/30/2018   Procedure: TRANSCATHETER  AORTIC VALVE REPLACEMENT, TRANSFEMORAL using a 72mm Edwards Sapien 3 Aortic Valve;  Surgeon: Burnell Blanks, MD;  Location: [facility];  Service: Open Heart Surgery;  Laterality: N/A;  . WISDOM TOOTH EXTRACTION  1990s    Current Outpatient Medications  Medication Sig Dispense Refill  . ALPRAZolam (XANAX) 0.25 MG tablet Take 0.25 mg by mouth in the morning, at noon, and at bedtime.    Marland Kitchen amLODipine (NORVASC) 5 MG tablet Take 1 tablet by mouth daily. 90 tablet 2  . amoxicillin (AMOXIL) 500 MG tablet Take 4 tablets (2,000 mg) one hour prior to dental visits. (Patient not taking: Reported on 11/19/2019) 8 tablet 6  . apixaban (ELIQUIS) 5 MG TABS tablet Take 1 tablet (5 mg total) by mouth 2 (two) times daily. 180 tablet 3  . ARIPiprazole (ABILIFY) 10 MG tablet Take 1 tablet (10 mg total) by mouth daily. 30 tablet 6  . atorvastatin (LIPITOR) 40 MG tablet Take 1 tablet (40 mg total) by mouth daily. 90 tablet 2  . Cholecalciferol (VITAMIN D-3) 1000 units CAPS Take 1,000 Units by mouth daily.     Marland Kitchen donepezil (ARICEPT) 10 MG tablet Take 1 tablet by mouth at bedtime. 90 tablet 2  . ipratropium (ATROVENT) 0.06 % nasal spray Place 1-2 sprays into both nostrils 4 (four) times daily as needed for rhinitis.    Marland Kitchen L-Methylfolate-Algae-B12-B6 (METANX) 3-90.314-2-35 MG CAPS Take 1 capsule by mouth 2 (two) times daily. 180 capsule 3  . levofloxacin (LEVAQUIN) 500 MG tablet Take 500 mg by mouth daily.    . Loratadine (CLARITIN PO) Take by mouth daily.    . memantine (NAMENDA) 10 MG tablet Take 1 tablet by mouth twice daily. 180 tablet 2  . Probiotic Product (PROBIOTIC DAILY PO) Take by mouth. While on antibiotics    . QUEtiapine (SEROQUEL) 50 MG tablet Take 2 tablets (100 mg total) by mouth at bedtime. 60 tablet 11  . sertraline (ZOLOFT) 50 MG tablet Take 100 mg by mouth daily.     . vitamin C (ASCORBIC ACID) 500 MG tablet Take 500 mg by mouth daily.     No current facility-administered medications for this  visit.    Allergies:   Patient has no known allergies.   Social History:  The patient  reports that he has quit smoking. His smoking use included cigarettes. He has a 16.00 pack-year smoking history. He has never used smokeless tobacco. He reports that he does not drink alcohol and does not use drugs.   Family History:  The patient's family history includes CAD in his father; Irregular heart beat in an other family member; Stroke in his father, mother, and another family member.  ROS:  Please see the history of present illness.    All other systems are reviewed and otherwise negative.   PHYSICAL EXAM:  VS:  There were no vitals taken for this visit. BMI: There is no height or weight on file to calculate BMI. Well nourished, well developed, in no acute distress  HEENT: normocephalic, atraumatic  Neck: no JVD, carotid bruits or masses  Cardiac:  RRR (paced); 2/6 SM, no rubs, or gallops Lungs:  CTA b/l, no wheezing, rhonchi or rales  Abd: soft, nontender MS: no deformity, age appropriate atrophy Ext: no edema  Skin: warm and dry, no rash Neuro:  Hard of hearung, otherwise no gross deficits appreciated Psych: euthymic mood, full affect  PPM site is stable, no tethering or discomfort   EKG:  Done today and reviewed by myself Aib, V paced  PPM interrogation done today and reviewed by myself:  Battery and lead measurements are good No R waves today at 40bpm 2 NSVT episodes One quite brief and irregular , the second was 6 seconds, slightly irregular, both  different morphology from paced Suspect both were NSVT Aug and Sept none since Has been seen previuosly   11/20/2019: TTE IMPRESSIONS  1. 23 mm S3 TAVR. Vmax 2.9 m/s, Mean gradient 19 mmHG, EOA 2.21 cm2, DI  0.41. Mild to moderate paravalvular leak is present in the 2-3 o'clock  position. Gradients stable compared with prior study and PVL unchanged.  Stable compared with 05/23/2019. The  aortic valve has been  repaired/replaced. Aortic valve regurgitation is  mild to moderate. There is a 23 mm Edwards Sapien prosthetic (TAVR) valve  present in the aortic position. Procedure Date: 05/30/18.  2. Left ventricular ejection fraction, by estimation, is 55 to 60%. The  left ventricle has normal function. The left ventricle has no regional  wall motion abnormalities. Left ventricular diastolic function could not  be evaluated.  3. Right ventricular systolic function is mildly reduced. The right  ventricular size is moderately enlarged. There is mildly elevated  pulmonary artery systolic pressure. The estimated right ventricular  systolic pressure is 123456 mmHg.  4. Right atrial size was severely dilated.  5. The mitral valve is degenerative. Mild mitral valve regurgitation. No  evidence of mitral stenosis.  6. Tricuspid valve regurgitation is moderate to severe.  7. There is mild dilatation of the ascending aorta measuring 41 mm.  8. The inferior vena cava is dilated in size with <50% respiratory  variability, suggesting right atrial pressure of 15 mmHg.   Comparison(s): A prior study was performed on 05/23/2019. No significant  change from prior study. EF unchanged. PVL similar in appearnce to prior  study.    05/23/2019: TTE IMPRESSIONS 1. - TAVR: A 23 mm S3 is present in the aortic position. The Vmax and gradients are increased compared with the prior study (06/29/2018). Vmax 3.3 m/s, peak/mean gradients 43/22 mmHG, AVA 1.28 cm2. There is a mild to moderate degree of paravalvular vs  central regurgitation (difficult to ascertain). The acceleration time is <100 ms, so I suspect the higher gradients possibly are related to slightly worsening regurgitaiton rather than obstruction.  2. The left ventricle has normal systolic function with an ejection fraction of 60-65%. The cavity size was normal. There is moderate concentric left ventricular hypertrophy. Left ventricular diastolic Doppler parameters  are indeterminate. No evidence  of left ventricular regional wall motion abnormalities.  3. The right ventricle has low normal systolic function. The cavity was mildly enlarged. There is no increase in right ventricular wall thickness. Right ventricular systolic pressure is normal.  4. Left atrial size was severely dilated.  5. Right atrial size was severely dilated.  6. Trivial pericardial effusion is present.  7. The tricuspid valve is grossly normal. Tricuspid valve regurgitation is mild-moderate.  8. A 41mm an Wende Crease Sapien bioprosthetic aortic valve (TAVR) valve is present in the aortic position. Procedure Date:  05/30/2018 Echo findings shows no evidence of regurgitation or perivalvular leak of the aortic prosthesis.  9. The aorta is normal unless otherwise noted. 10. Aortic valve regurgitation is mild to moderate by color flow Doppler. 11. The mitral valve is degenerative. Moderate thickening of the mitral valve leaflet. Moderate calcification of the mitral valve leaflet. There is moderate mitral annular calcification present. No evidence of mitral valve stenosis. 12. When compared to the prior study: Higher gradients noted across prosthetic aortic valve as noted above when compared with the prior study (06/29/2018).   05/03/2018: LHC  Ost Ramus to Ramus lesion is 30% stenosed.  Prox Cx to Mid Cx lesion is 30% stenosed.  Ost LAD to Prox LAD lesion is 30% stenosed.  Prox LAD to Dist LAD lesion is 20% stenosed.  LV end diastolic pressure is normal.   1. Mild non-obstructive CAD 2. Severe aortic stenosis by echo criteria. By cath the peak to peak gradient across the valve is 7 mmHg and the mean gradient is 9.1 mmHg.     Recent Labs: 10/11/2020: BUN 19; Creatinine, Ser 1.10; Hemoglobin 13.9; Potassium 4.4; Sodium 137  No results found for requested labs within last 8760 hours.   CrCl cannot be calculated (Unknown ideal weight.).   Wt Readings from Last 3 Encounters:   11/19/19 180 lb 3.2 oz (81.7 kg)  11/14/19 176 lb (79.8 kg)  03/07/19 179 lb 8 oz (81.4 kg)     Other studies reviewed: Additional studies/records reviewed today include: summarized above  ASSESSMENT AND PLAN:  1. PPM     Intact function     No progamming changes made  2. Persistent >> permanent AFib     CHA2DS2Vasc is 3, on Eliquis, appropriately dosed     Pacer programmed VVIR  3. VHD w/severe AS     S/p TAVR     planned for annual echos vis strutural heart team     Will get an echo done     D/w Dr. Angelena Form after the patient left, he would be happy to see the patient post TAVR annually.  I have asked my MA to reach put to the patient and arrange follow up at [facility]   4. HTN     Looks OK, no changes    Disposition: remotes as usual and in clinic with EP in 70mo, sooner if needed    Current medicines are reviewed at length with the patient today.  The patient did not have any concerns regarding medicines.  Venetia Night, PA-C 10/15/2020 7:48 PM     [address] [place] [place] [address] [phone] (office)  [phone] (fax)

## 2020-10-16 ENCOUNTER — Encounter: Payer: Self-pay | Admitting: Physician Assistant

## 2020-10-16 ENCOUNTER — Ambulatory Visit (INDEPENDENT_AMBULATORY_CARE_PROVIDER_SITE_OTHER): Payer: Medicare Other | Admitting: Physician Assistant

## 2020-10-16 ENCOUNTER — Other Ambulatory Visit: Payer: Self-pay

## 2020-10-16 VITALS — BP 134/76 | HR 60 | Ht 70.0 in | Wt 169.0 lb

## 2020-10-16 DIAGNOSIS — Z952 Presence of prosthetic heart valve: Secondary | ICD-10-CM | POA: Diagnosis not present

## 2020-10-16 DIAGNOSIS — I4821 Permanent atrial fibrillation: Secondary | ICD-10-CM

## 2020-10-16 DIAGNOSIS — I10 Essential (primary) hypertension: Secondary | ICD-10-CM

## 2020-10-16 DIAGNOSIS — Z950 Presence of cardiac pacemaker: Secondary | ICD-10-CM | POA: Diagnosis not present

## 2020-10-16 NOTE — Patient Instructions (Signed)
Medication Instructions:   Your physician recommends that you continue on your current medications as directed. Please refer to the Current Medication list given to you today.   *If you need a refill on your cardiac medications before your next appointment, please call your pharmacy*   Lab Work: NONE ORDERED  TODAY    If you have labs (blood work) drawn today and your tests are completely normal, you will receive your results only by: . MyChart Message (if you have MyChart) OR . A paper copy in the mail If you have any lab test that is abnormal or we need to change your treatment, we will call you to review the results.   Testing/Procedures: Your physician has requested that you have an echocardiogram. Echocardiography is a painless test that uses sound waves to create images of your heart. It provides your doctor with information about the size and shape of your heart and how well your heart's chambers and valves are working. This procedure takes approximately one hour. There are no restrictions for this procedure.   Follow-Up: At [facility], you and your health needs are our priority.  As part of our continuing mission to provide you with exceptional heart care, we have created designated Provider Care Teams.  These Care Teams include your primary Cardiologist (physician) and Advanced Practice Providers (APPs -  Physician Assistants and Nurse Practitioners) who all work together to provide you with the care you need, when you need it.  We recommend signing up for the patient portal called "MyChart".  Sign up information is provided on this After Visit Summary.  MyChart is used to connect with patients for Virtual Visits (Telemedicine).  Patients are able to view lab/test results, encounter notes, upcoming appointments, etc.  Non-urgent messages can be sent to your provider as well.   To learn more about what you can do with MyChart, go to [email or web address].    Your next  appointment:   1 year(s)  The format for your next appointment:   In Person  Provider:   You may see Dr. Taylor  or one of the following Advanced Practice Providers on your designated Care Team:    Amber Seiler, NP  Renee Ursuy, PA-C  Michael "Andy" Tillery, PA-C    Other Instructions    

## 2020-10-23 DIAGNOSIS — F0391 Unspecified dementia with behavioral disturbance: Secondary | ICD-10-CM | POA: Diagnosis not present

## 2020-10-23 DIAGNOSIS — I10 Essential (primary) hypertension: Secondary | ICD-10-CM | POA: Diagnosis not present

## 2020-11-07 ENCOUNTER — Other Ambulatory Visit: Payer: Self-pay

## 2020-11-07 ENCOUNTER — Other Ambulatory Visit (HOSPITAL_COMMUNITY): Payer: Medicare Other

## 2020-11-07 ENCOUNTER — Ambulatory Visit (HOSPITAL_COMMUNITY): Payer: Medicare Other | Attending: Internal Medicine

## 2020-11-07 DIAGNOSIS — Z952 Presence of prosthetic heart valve: Secondary | ICD-10-CM | POA: Diagnosis not present

## 2020-11-07 LAB — ECHOCARDIOGRAM COMPLETE
AR max vel: 1.27 cm2
AV Area VTI: 1.34 cm2
AV Area mean vel: 1.35 cm2
AV Mean grad: 16.8 mmHg
AV Peak grad: 33.1 mmHg
Ao pk vel: 2.88 m/s
Area-P 1/2: 1.86 cm2
P 1/2 time: 480 msec
S' Lateral: 2.6 cm

## 2020-11-10 ENCOUNTER — Telehealth: Payer: Self-pay | Admitting: Internal Medicine

## 2020-11-10 NOTE — Telephone Encounter (Signed)
Patient's wife is calling about echo results. Please call back to discuss

## 2020-11-10 NOTE — Telephone Encounter (Signed)
Lvm for patient and wife of stable results and and to call back to be able to  get appointment with Dr Ernest Pine  to reestablish

## 2020-11-12 DIAGNOSIS — N312 Flaccid neuropathic bladder, not elsewhere classified: Secondary | ICD-10-CM | POA: Diagnosis not present

## 2020-11-12 DIAGNOSIS — R338 Other retention of urine: Secondary | ICD-10-CM | POA: Diagnosis not present

## 2020-11-13 ENCOUNTER — Telehealth: Payer: Self-pay

## 2020-11-13 ENCOUNTER — Emergency Department (HOSPITAL_COMMUNITY): Payer: Medicare Other

## 2020-11-13 ENCOUNTER — Other Ambulatory Visit: Payer: Self-pay

## 2020-11-13 ENCOUNTER — Emergency Department (HOSPITAL_COMMUNITY)
Admission: EM | Admit: 2020-11-13 | Discharge: 2020-11-14 | Disposition: A | Discharge status: Home / Self Care | Payer: Medicare Other | Attending: Emergency Medicine | Admitting: Emergency Medicine

## 2020-11-13 DIAGNOSIS — Z7901 Long term (current) use of anticoagulants: Secondary | ICD-10-CM | POA: Diagnosis not present

## 2020-11-13 DIAGNOSIS — R410 Disorientation, unspecified: Secondary | ICD-10-CM | POA: Diagnosis not present

## 2020-11-13 DIAGNOSIS — I2510 Atherosclerotic heart disease of native coronary artery without angina pectoris: Secondary | ICD-10-CM | POA: Diagnosis not present

## 2020-11-13 DIAGNOSIS — I70203 Unspecified atherosclerosis of native arteries of extremities, bilateral legs: Secondary | ICD-10-CM | POA: Diagnosis not present

## 2020-11-13 DIAGNOSIS — F0391 Unspecified dementia with behavioral disturbance: Secondary | ICD-10-CM | POA: Insufficient documentation

## 2020-11-13 DIAGNOSIS — I708 Atherosclerosis of other arteries: Secondary | ICD-10-CM | POA: Diagnosis not present

## 2020-11-13 DIAGNOSIS — N281 Cyst of kidney, acquired: Secondary | ICD-10-CM | POA: Diagnosis not present

## 2020-11-13 DIAGNOSIS — Z87891 Personal history of nicotine dependence: Secondary | ICD-10-CM | POA: Insufficient documentation

## 2020-11-13 DIAGNOSIS — R52 Pain, unspecified: Secondary | ICD-10-CM | POA: Diagnosis not present

## 2020-11-13 DIAGNOSIS — Z950 Presence of cardiac pacemaker: Secondary | ICD-10-CM | POA: Insufficient documentation

## 2020-11-13 DIAGNOSIS — K8020 Calculus of gallbladder without cholecystitis without obstruction: Secondary | ICD-10-CM | POA: Diagnosis not present

## 2020-11-13 DIAGNOSIS — R059 Cough, unspecified: Secondary | ICD-10-CM | POA: Insufficient documentation

## 2020-11-13 DIAGNOSIS — R404 Transient alteration of awareness: Secondary | ICD-10-CM | POA: Diagnosis not present

## 2020-11-13 DIAGNOSIS — M549 Dorsalgia, unspecified: Secondary | ICD-10-CM | POA: Diagnosis not present

## 2020-11-13 DIAGNOSIS — M546 Pain in thoracic spine: Secondary | ICD-10-CM | POA: Diagnosis not present

## 2020-11-13 DIAGNOSIS — I10 Essential (primary) hypertension: Secondary | ICD-10-CM | POA: Diagnosis not present

## 2020-11-13 DIAGNOSIS — J90 Pleural effusion, not elsewhere classified: Secondary | ICD-10-CM | POA: Diagnosis not present

## 2020-11-13 DIAGNOSIS — J984 Other disorders of lung: Secondary | ICD-10-CM | POA: Diagnosis not present

## 2020-11-13 DIAGNOSIS — J9811 Atelectasis: Secondary | ICD-10-CM | POA: Diagnosis not present

## 2020-11-13 LAB — COMPREHENSIVE METABOLIC PANEL
ALT: 34 U/L (ref 0–44)
AST: 45 U/L — ABNORMAL HIGH (ref 15–41)
Albumin: 3.8 g/dL (ref 3.5–5.0)
Alkaline Phosphatase: 93 U/L (ref 38–126)
Anion gap: 11 (ref 5–15)
BUN: 21 mg/dL (ref 8–23)
CO2: 24 mmol/L (ref 22–32)
Calcium: 8.8 mg/dL — ABNORMAL LOW (ref 8.9–10.3)
Chloride: 102 mmol/L (ref 98–111)
Creatinine, Ser: 0.98 mg/dL (ref 0.61–1.24)
GFR, Estimated: >60 mL/min (ref >60)
Glucose, Bld: 101 mg/dL — ABNORMAL HIGH (ref 70–99)
Potassium: 3.9 mmol/L (ref 3.5–5.1)
Sodium: 137 mmol/L (ref 135–145)
Total Bilirubin: 1.1 mg/dL (ref 0.3–1.2)
Total Protein: 6.9 g/dL (ref 6.5–8.1)

## 2020-11-13 LAB — URINALYSIS, ROUTINE W REFLEX MICROSCOPIC
Bilirubin Urine: NEGATIVE
Glucose, UA: NEGATIVE mg/dL
Hgb urine dipstick: NEGATIVE
Ketones, ur: 5 mg/dL — AB
Nitrite: NEGATIVE
Protein, ur: 100 mg/dL — AB
Specific Gravity, Urine: 1.03 (ref 1.005–1.030)
WBC, UA: >50 WBC/hpf — ABNORMAL HIGH (ref 0–5)
pH: 5 (ref 5.0–8.0)

## 2020-11-13 LAB — CBC WITH DIFFERENTIAL/PLATELET
Abs Immature Granulocytes: 0.05 10*3/uL (ref 0.00–0.07)
Basophils Absolute: 0 10*3/uL (ref 0.0–0.1)
Basophils Relative: 0 %
Eosinophils Absolute: 0.1 10*3/uL (ref 0.0–0.5)
Eosinophils Relative: 1 %
HCT: 41.5 % (ref 39.0–52.0)
Hemoglobin: 14.5 g/dL (ref 13.0–17.0)
Immature Granulocytes: 1 %
Lymphocytes Relative: 11 %
Lymphs Abs: 1.2 10*3/uL (ref 0.7–4.0)
MCH: 32.1 pg (ref 26.0–34.0)
MCHC: 34.9 g/dL (ref 30.0–36.0)
MCV: 91.8 fL (ref 80.0–100.0)
Monocytes Absolute: 0.8 10*3/uL (ref 0.1–1.0)
Monocytes Relative: 7 %
Neutro Abs: 8.4 10*3/uL — ABNORMAL HIGH (ref 1.7–7.7)
Neutrophils Relative %: 80 %
Platelets: 198 10*3/uL (ref 150–400)
RBC: 4.52 MIL/uL (ref 4.22–5.81)
RDW: 13.6 % (ref 11.5–15.5)
WBC: 10.5 10*3/uL (ref 4.0–10.5)
nRBC: 0 % (ref 0.0–0.2)

## 2020-11-13 LAB — TROPONIN I (HIGH SENSITIVITY)
Troponin I (High Sensitivity): 47 ng/L — ABNORMAL HIGH (ref <18)
Troponin I (High Sensitivity): 47 ng/L — ABNORMAL HIGH (ref <18)

## 2020-11-13 LAB — LIPASE, BLOOD: Lipase: 32 U/L (ref 11–51)

## 2020-11-13 MED ORDER — MORPHINE SULFATE (PF) 2 MG/ML IV SOLN
2.0000 mg | Freq: Once | INTRAVENOUS | Status: AC
  Administered 2020-11-13: 2 mg via INTRAVENOUS
  Filled 2020-11-13: qty 1

## 2020-11-13 MED ORDER — OXYCODONE-ACETAMINOPHEN 5-325 MG PO TABS: 1.0000 | ORAL_TABLET | Freq: Four times a day (QID) | ORAL | 0 refills | Status: DC | PRN

## 2020-11-13 MED ORDER — HYDROCODONE-ACETAMINOPHEN 5-325 MG PO TABS
1.0000 | ORAL_TABLET | Freq: Once | ORAL | Status: AC
  Administered 2020-11-13: 1 via ORAL
  Filled 2020-11-13: qty 1

## 2020-11-13 MED ORDER — SODIUM CHLORIDE 0.9 % IV SOLN
2.0000 g | Freq: Once | INTRAVENOUS | Status: AC
  Administered 2020-11-13: 2 g via INTRAVENOUS
  Filled 2020-11-13: qty 20

## 2020-11-13 MED ORDER — IOHEXOL 350 MG/ML SOLN
100.0000 mL | Freq: Once | INTRAVENOUS | Status: AC | PRN
  Administered 2020-11-13: 100 mL via INTRAVENOUS

## 2020-11-13 NOTE — ED Notes (Signed)
Patient transported to Ultrasound 

## 2020-11-13 NOTE — Telephone Encounter (Signed)
Attempted to contact patient's wife [name] to schedule a Palliative Care consult appointment. No answer left a message to return call.

## 2020-11-13 NOTE — Telephone Encounter (Signed)
Spoke with patient's wife Jeffery Rogers and scheduled an in-person Palliative Consult for 11/28/20 @ 8:30AM  COVID screening was negative. No pets in home. Patient lives with wife.  Consent obtained; updated Outlook/Netsmart/Team List and Epic.  Family is aware they will be receiving a call from NP the day before or day of to confirm appointment.

## 2020-11-13 NOTE — Discharge Instructions (Signed)
1.  Take 1-2 Percocet every 6 hours for pain. 2.  See your doctor for recheck as soon as possible. 3.  You may try heating pads for additional pain control on the back.

## 2020-11-13 NOTE — ED Provider Notes (Signed)
[facility] EMERGENCY DEPARTMENT Provider Note   CSN: [id number] Arrival date & time: 11/13/20  1820     History Chief Complaint  Patient presents with  . Flank Pain    Jeffery Rogers is a 87 y.o. male.  HPI Patient wife is main historian.  The patient has dementia.  She reports that he has had the pain off and on in the right thoracic back.  Seems to been going on for about a week.  Earlier, he has having problems with urinary retention and got a Foley catheter placed.  She reports the pain seems to be higher than his kidneys.  She has been able to localize it to the right thoracic back around the area of the scapula.  Today for the first time, the pain was continuous and he requested to come to the hospital.  They have tried several doses of Tylenol with a little bit of relief.  No fevers at home.  Some occasional cough.  No appearance of shortness of breath.  No vomiting.  He has been eating well.  He denies any history of similar pain.    Past Medical History:  Diagnosis Date  . Arthritis    "right arm/shoulder" (02/10/2016)  . Complete heart block ([place]) 02/10/2016   a. s/p Emergency planning/management officer  . Goiter   . Hypercholesteremia   . Memory loss   . Nasal septal deviation   . Persistent atrial fibrillation ([place])   . Presence of permanent cardiac pacemaker   . 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 Severe aortic stenosis   . Tremor     Patient Active Problem List   Diagnosis Date Noted  . Late onset Alzheimer's disease with behavioral disturbance ([place]) 11/19/2019  . Dementia with behavioral disturbance ([place])   . Fall 03/07/2019  . Noise effect on both inner ears 10/06/2018  . Memory loss   . Presence of permanent cardiac pacemaker   . Persistent atrial fibrillation ([place])   . S/P TAVR (transcatheter aortic valve replacement)  05/30/2018  . Severe aortic stenosis   . Tendinopathy of left rotator cuff 12/19/2017  . Bilateral impacted cerumen 11/25/2017  . Presbycusis of both ears 11/25/2017  . Vasomotor rhinitis 11/25/2017  . Dizziness 05/15/2017  . Chronic right maxillary sinusitis 03/30/2017  . Rhinitis 03/30/2017  . Elevated CPK 02/18/2017  . Sinusitis 02/18/2017  . Peripheral neuropathy 02/18/2017  . Chronic maxillary sinusitis 02/18/2017  . Mild cognitive impairment 01/12/2017  . Essential tremor 01/12/2017  . Orthostatic dizziness 01/12/2017  . Diplopia 01/12/2017  . First degree atrioventricular block 03/29/2013  . Hypercholesteremia   . Goiter   . Nasal septal deviation   . Snoring     Past Surgical History:  Procedure Laterality Date  . CATARACT EXTRACTION W/ INTRAOCULAR LENS  IMPLANT, BILATERAL Bilateral ~ 2000  . EP IMPLANTABLE DEVICE N/A 02/11/2016   Procedure: Pacemaker Implant;  Surgeon: Armenti Lance, MD;  Location: [facility];  Service: Cardiovascular;  Laterality: N/A;  . RIGHT/LEFT HEART CATH AND CORONARY ANGIOGRAPHY N/A 05/03/2018   Procedure: RIGHT/LEFT HEART CATH AND CORONARY ANGIOGRAPHY;  Surgeon: Burnell Blanks, MD;  Location: [facility];  Service: Cardiovascular;  Laterality: N/A;  . TEE WITHOUT CARDIOVERSION N/A 05/30/2018   Procedure: TRANSESOPHAGEAL ECHOCARDIOGRAM (TEE);  Surgeon: Burnell Blanks, MD;  Location: [facility];  Service: Open Heart  Surgery;  Laterality: N/A;  . TONSILLECTOMY  1930s  . TRANSCATHETER AORTIC VALVE REPLACEMENT, TRANSFEMORAL N/A 05/30/2018   Procedure: TRANSCATHETER AORTIC VALVE REPLACEMENT, TRANSFEMORAL using a 31mm Edwards Sapien 3 Aortic Valve;  Surgeon: Burnell Blanks, MD;  Location: [facility];  Service: Open Heart Surgery;  Laterality: N/A;  . WISDOM TOOTH EXTRACTION  1990s       Family History  Problem Relation Age of Onset  . Stroke Other   . Irregular heart beat Other   . Stroke Mother   . Stroke Father    . CAD Father        MI    Social History   Tobacco Use  . Smoking status: Former Smoker    Packs/day: 1.00    Years: 16.00    Pack years: 16.00    Types: Cigarettes  . Smokeless tobacco: Never Used  . Tobacco comment: Quit smoking cigarettes in 1971  Vaping Use  . Vaping Use: Never used  Substance Use Topics  . Alcohol use: Never    Alcohol/week: 2.0 standard drinks    Types: 1 Cans of beer, 1 Shots of liquor per week  . Drug use: No    Home Medications Prior to Admission medications   Medication Sig Start Date End Date Taking? Authorizing Provider  oxyCODONE-acetaminophen (PERCOCET) 5-325 MG tablet Take 1-2 tablets by mouth every 6 (six) hours as needed. 11/13/20  Yes Charlesetta Shanks, MD  ALPRAZolam Duanne Moron) 0.25 MG tablet Take 0.25 mg by mouth in the morning, at noon, and at bedtime.    [provider]  amLODipine (NORVASC) 5 MG tablet Take 1 tablet by mouth daily. Patient taking differently: 2.5 mg. 04/15/20   Eileen Stanford, PA-C  amoxicillin (AMOXIL) 500 MG tablet Take 4 tablets (2,000 mg) one hour prior to dental visits. 08/23/19   Eileen Stanford, PA-C  apixaban (ELIQUIS) 5 MG TABS tablet Take 1 tablet (5 mg total) by mouth 2 (two) times daily. 02/08/17   Selmon Lance, MD  ARIPiprazole (ABILIFY) 10 MG tablet Take 1 tablet (10 mg total) by mouth daily. 09/03/20   Marcial Pacas, MD  atorvastatin (LIPITOR) 40 MG tablet Take 1 tablet (40 mg total) by mouth daily. 05/24/17   Bachand Lance, MD  Cholecalciferol (VITAMIN D-3) 1000 units CAPS Take 1,000 Units by mouth daily.     [provider]  donepezil (ARICEPT) 10 MG tablet Take 1 tablet by mouth at bedtime. 03/17/20   Marcial Pacas, MD  ipratropium (ATROVENT) 0.06 % nasal spray Place 1-2 sprays into both nostrils 4 (four) times daily as needed for rhinitis.    [provider]  L-Methylfolate-Algae-B12-B6 Glade Stanford) 3-90.314-2-35 MG CAPS Take 1 capsule by mouth 2 (two) times daily. 03/25/20   Marcial Pacas, MD  Loratadine (CLARITIN PO) Take by mouth daily.    [provider]  memantine (NAMENDA) 10 MG tablet Take 1 tablet by mouth twice daily. 03/17/20   Marcial Pacas, MD  mirtazapine (REMERON) 7.5 MG tablet Take 7.5 mg by mouth daily. 02/19/20   [provider]  QUEtiapine (SEROQUEL) 50 MG tablet Take 2 tablets (100 mg total) by mouth at bedtime. 09/03/20   Marcial Pacas, MD  sertraline (ZOLOFT) 50 MG tablet Take 100 mg by mouth daily.     [provider]  tamsulosin (FLOMAX) 0.4 MG CAPS capsule Take 0.8 mg by mouth daily. 08/17/20   [provider]  vitamin C (ASCORBIC ACID) 500 MG tablet Take 500 mg by mouth  daily.    [provider]    Allergies    Patient has no known allergies.  Review of Systems   Review of Systems 10 systems reviewed and negative except as per HPI Physical Exam Updated Vital Signs BP (!) 158/58   Pulse 60   Temp 98 F (36.7 C) (Oral)   Resp 20   SpO2 99%   Physical Exam Constitutional:      Comments: Patient is alert.  No acute distress.  No respiratory distress.  Well-nourished well-developed.  HENT:     Head: Normocephalic and atraumatic.     Mouth/Throat:     Pharynx: Oropharynx is clear.  Eyes:     Extraocular Movements: Extraocular movements intact.  Cardiovascular:     Rate and Rhythm: Normal rate and regular rhythm.  Pulmonary:     Effort: Pulmonary effort is normal.     Breath sounds: Normal breath sounds.     Comments: Patient does not really have reproducible pain.  He does endorse that the area of pain to palpation around the right scapula.  There is no rash or soft tissue abnormality present. Abdominal:     General: There is no distension.     Palpations: Abdomen is soft.     Tenderness: There is no abdominal tenderness. There is no guarding.  Musculoskeletal:        General: No swelling, tenderness, deformity or signs of injury. Normal range of motion.  Skin:    General: Skin is warm and dry.   Neurological:     General: No focal deficit present.     Coordination: Coordination normal.     ED Results / Procedures / Treatments   Labs (all labs ordered are listed, but only abnormal results are displayed) Labs Reviewed  COMPREHENSIVE METABOLIC PANEL - Abnormal; Notable for the following components:      Result Value   Glucose, Bld 101 (*)    Calcium 8.8 (*)    AST 45 (*)    All other components within normal limits  CBC WITH DIFFERENTIAL/PLATELET - Abnormal; Notable for the following components:   Neutro Abs 8.4 (*)    All other components within normal limits  URINALYSIS, ROUTINE W REFLEX MICROSCOPIC - Abnormal; Notable for the following components:   APPearance CLOUDY (*)    Ketones, ur 5 (*)    Protein, ur 100 (*)    Leukocytes,Ua LARGE (*)    WBC, UA >50 (*)    Bacteria, UA RARE (*)    All other components within normal limits  TROPONIN I (HIGH SENSITIVITY) - Abnormal; Notable for the following components:   Troponin I (High Sensitivity) 47 (*)    All other components within normal limits  TROPONIN I (HIGH SENSITIVITY) - Abnormal; Notable for the following components:   Troponin I (High Sensitivity) 47 (*)    All other components within normal limits  LIPASE, BLOOD    EKG None  Radiology DG Chest 2 View  Result Date: 11/13/2020 CLINICAL DATA:  Right upper back pain. EXAM: CHEST - 2 VIEW COMPARISON:  Chest x-ray dated April 16, 2019. FINDINGS: Unchanged left chest wall pacemaker. Interval enlargement of the cardiopericardial silhouette with a globular appearance. Prior TAVR. Normal pulmonary vascularity. No focal consolidation, pleural effusion, or pneumothorax. No acute osseous abnormality. IMPRESSION: 1. Interval enlargement of the cardiopericardial silhouette with a globular appearance, raising concern for pericardial effusion. Electronically Signed   By: Titus Dubin M.D.   On: 11/13/2020 19:55   CT Angio  Chest/Abd/Pel for Dissection W and/or  W/WO  Result Date: 11/13/2020 CLINICAL DATA:  Aortic dissection suspected, right flank pain for 4 days, recent UTI, history of dementia EXAM: CT ANGIOGRAPHY CHEST, ABDOMEN AND PELVIS TECHNIQUE: Non-contrast CT of the chest was initially obtained. Multidetector CT imaging through the chest, abdomen and pelvis was performed using the standard protocol during bolus administration of intravenous contrast. Multiplanar reconstructed images and MIPs were obtained and reviewed to evaluate the vascular anatomy. CONTRAST:  178mL OMNIPAQUE IOHEXOL 350 MG/ML SOLN COMPARISON:  Radiograph 11/13/2020, CT 05/16/2018 thyroid ultrasound 12/22/2012 FINDINGS: CTA CHEST FINDINGS Cardiovascular: Noncontrast CT images of the chest were obtained initially. Images depict a normal caliber thoracic aorta mild dilatation of the ascending thoracic aorta to 4 cm, not significantly changed from comparison imaging in 2019. Atherosclerotic plaque within the normal caliber aorta. No hyperdense mural thickening or plaque displacement to suggest intramural hematoma. Postcontrast administration there is satisfactory opacification of the arterial vasculature. No acute luminal abnormality of the imaged aorta. No periaortic stranding or hemorrhage. Normal 3 vessel branching of the aortic arch. Proximal great vessels mildly calcified and tortuous but otherwise unremarkable. No significant flow-limiting stenosis or occlusion is seen. Central pulmonary arteries are borderline enlarged. No large central, lobar or proximal segmental filling defects are seen within limitations of this non tailored examination of the pulmonary arteries. Redemonstration of cardiomegaly with predominantly right atrioventricular enlargement. A left chest wall battery pack is in position with pacer leads terminating at the right atrium and cardiac apex. There is dense calcification of mitral annulus. Prior transcatheter aortic valve replacement is noted, in expected positioning.  Extensive coronary artery calcifications are seen. No pericardial effusion. Slight reflux of contrast into the hepatic veins and IVC. No major venous abnormalities within the limitations of this arterial phase exam. Mediastinum/Nodes: No mediastinal fluid or gas. Stable appearance of a 2 cm hypoattenuating the partially calcified right posterior thyroid nodule. Thoracic inlet is otherwise unremarkable. No acute abnormality of the trachea or esophagus. No worrisome mediastinal, hilar or axillary adenopathy. Lungs/Pleura: Chronic region of of focal subpleural scarring and architectural distortion in the right upper lobe. More mild biapical pleuroparenchymal scarring is present as well. Some dependent atelectasis is seen posteriorly. Stable calcified perifissural nodule measuring 2 mm in the right middle lobe. No known new or concerning pulmonary nodules or masses are seen. Musculoskeletal: The osseous structures appear diffusely demineralized which may limit detection of small or nondisplaced fractures. No acute osseous abnormality or suspicious osseous lesion. Levocurvature of the lower thoracic spine, apex T8-9 with exaggerated thoracic kyphosis is similar to comparison exam. Additional degenerative changes in the shoulders and sternoclavicular joints. No worrisome chest wall masses or lesions. Left chest wall battery pack, as above. Mild bilateral gynecomastia. Review of the MIP images confirms the above findings. CTA ABDOMEN AND PELVIS FINDINGS VASCULAR Aorta: Calcified and noncalcified atheromatous plaque throughout the abdominal aorta. No flow-limiting stenosis or occlusion is seen. No acute luminal abnormality. No periaortic stranding or hemorrhage. Celiac: Widely patent ostium, normal opacification. Minimal plaque in the proximal branches, most pronounced in the splenic artery, without significant flow-limiting stenosis or occlusion. No aneurysm or ectasia. No acute luminal abnormality. SMA: Mild ostial plaque  narrowing. Otherwise patent without evidence of aneurysm, dissection, vasculitis or other significant stenosis. Renals: Single renal arteries bilaterally. Mild bilateral ostial plaque narrowing, left slightly greater than right. Vessels are otherwise normally opacified without acute luminal abnormality, features of vasculitis or fibromuscular dysplasia. IMA: Mild-to-moderate ostial plaque narrowing. Otherwise normally opacified. No evidence of  aneurysm, dissection or vasculitis. Inflow: Calcification of the inflow vasculature predominantly within the common and internal iliac arteries. Mild ectasia of the internal iliac just proximal to the bifurcation of the anterior and posterior divisions. No other acute vascular abnormality is seen in the inflow vessels. No acute luminal abnormality. No high-grade stenosis or occlusions. Limited visualization of the proximal outflow with calcified plaque in the bilateral common and superficial femoral arteries. Widely patent origins of the bilateral profundus femora S arteries. Veins: Reflux of contrast into the hepatic veins and IVC. No other major venous abnormality within limitations of this arterial phase examination. Review of the MIP images confirms the above findings. NON-VASCULAR Hepatobiliary: No worrisome focal liver lesions. Smooth liver surface contour. Normal hepatic attenuation. Gallbladder contains a dependently layering calcified 11 mm gallstone. There is some additional hyperdense thickening towards the gallbladder fundus which could reflect inflammatory wall thickening versus adenomyomatosis. No other pericholecystic fluid or inflammation is seen. No biliary ductal dilatation or visible intraductal gallstones. Small air-filled duodenal diverticula near the ampulla. Pancreas: No pancreatic ductal dilatation or surrounding inflammatory changes. Spleen: Mild heterogeneity of the splenic enhancement is a typical finding in the arterial phase of imaging. Normal in  size. No concerning splenic lesions. Adrenals/Urinary Tract: Kidneys enhance symmetrically and uniformly. Fluid attenuation cyst is seen in the interpolar left kidney measuring up to 3.2 cm and lower pole right kidney measuring up to 4.1 cm. No concerning internal complexity. No other concerning renal mass. No visible urolithiasis or hydronephrosis. Bladder decompressed by inflated Foley catheter. Mild bladder wall thickening perivesicular hazy stranding is nonspecific with few punctate foci of gas likely related to instrumentation. Stomach/Bowel: Distal esophagus, stomach and duodenal sweep are unremarkable. No small bowel wall thickening or dilatation. No evidence of obstruction. Normal air-filled appendix in the right lower quadrant. No colonic dilatation or wall thickening. Lymphatic: No suspicious or enlarged lymph nodes in the included lymphatic chains. Reproductive: Mild prostatomegaly. Seminal vesicles are unremarkable. Other: No abdominopelvic free fluid or free gas. No bowel containing hernias. Mild body wall edema. Some focal skin thickening noted adjacent the greater trochanters, correlate with visual inspection. Musculoskeletal: The osseous structures appear diffusely demineralized which may limit detection of small or nondisplaced fractures. No acute osseous abnormality or suspicious osseous lesion. Multilevel degenerative changes are present in the imaged portions of the spine. Features most pronounced L4, L5 to some mild retrolisthesis L5 on S1 and resulting moderate bilateral foraminal narrowing. Additional degenerative changes in the hips and pelvis. Review of the MIP images confirms the above findings. IMPRESSION: Vascular 1. No evidence of acute aortic syndrome. 2. Stable dilatation of the ascending thoracic aorta to 4 cm. Recommend annual imaging followup by CTA or MRA. This recommendation follows 2010 ACCF/AHA/AATS/ACR/ASA/SCA/SCAI/SIR/STS/SVM Guidelines for the Diagnosis and Management of  Patients with Thoracic Aortic Disease. Circulation. 2010; 121: T267-T245. Aortic aneurysm NOS (ICD10-I71.9) 3. No evidence of pulmonary embolism. 4. Cardiomegaly with right atrioventricular enlargement. Reflux of contrast into the hepatic veins and IVC, suggestive of right heart dysfunction. Central pulmonary artery enlargement may reflect further elevation of the right sided pressures or chronic pulmonary artery hypertension. 5.  Aortic Atherosclerosis (ICD10-I70.0). 6. Mild to moderate ostial narrowing of the inferior mesenteric artery. Mild ostial narrowing of the bilateral renal arteries and SMA. 7. Mild ectasia of the left internal iliac, unchanged from prior Nonvascular 1. Foley catheter in place. Mild bladder wall thickening with perivesicular hazy stranding, correlate with urinalysis to exclude cystitis. No obstructive urolithiasis or hydronephrosis is seen. 2. Cholelithiasis with  mild gallbladder wall thickening versus adenomyomatosis at the gallbladder fundus. Correlate with right upper quadrant symptoms and consider further evaluation with right upper quadrant ultrasound. 3. Stable appearance of a 2 cm hypoattenuating the partially calcified right posterior thyroid nodule. This has been evaluated on previous imaging. (ref: J Am Coll Radiol. 2015 Feb;12(2): 143-50). 4. Stable regions of subpleural scarring and architectural distortion in the right upper lobe. Electronically Signed   By: Lovena Le M.D.   On: 11/13/2020 22:49    Procedures Procedures   Medications Ordered in ED Medications  cefTRIAXone (ROCEPHIN) 2 g in sodium chloride 0.9 % 100 mL IVPB (2 g Intravenous New Bag/Given 11/13/20 2348)  HYDROcodone-acetaminophen (NORCO/VICODIN) 5-325 MG per tablet 1 tablet (1 tablet Oral Given 11/13/20 1958)  morphine 2 MG/ML injection 2 mg (2 mg Intravenous Given 11/13/20 2125)  iohexol (OMNIPAQUE) 350 MG/ML injection 100 mL (100 mLs Intravenous Contrast Given 11/13/20 2221)    ED Course  I have  reviewed the triage vital signs and the nursing notes.  Pertinent labs & imaging results that were available during my care of the patient were reviewed by me and considered in my medical decision making (see chart for details).    MDM Rules/Calculators/A&P                           Patient presents with right thoracic back pain.  Patient's wife is the main historian.  Patient does have dementia but can answer questions and report pain.  Chest x-ray obtained with concern for cardiac enlargement.  Follow-up CT angiogram identifies multiple chronic conditions but no acute concerning vascular findings.  Patient has had an echocardiogram within the past several weeks.  Stable findings.  CT did identify possible gallbladder wall thickening.  Certainly, patient's pain could be due to cholecystitis.  Will need to proceed with ultrasound.  If ultrasound does not identify any cholecystitis or acute findings of the gallbladder, patient will be stable for discharge home.  Reviewed plan and follow-up with the patient's wife.  Final Clinical Impression(s) / ED Diagnoses Final diagnoses:  Pain  Acute right-sided thoracic back pain    Rx / DC Orders ED Discharge Orders         Ordered    oxyCODONE-acetaminophen (PERCOCET) 5-325 MG tablet  Every 6 hours PRN        11/13/20 2348           Charlesetta Shanks, MD 11/13/20 2351

## 2020-11-13 NOTE — ED Notes (Signed)
Patient transported to X-ray 

## 2020-11-13 NOTE — ED Notes (Signed)
Pt returned to room (from XR). 

## 2020-11-13 NOTE — ED Triage Notes (Signed)
Assume care of EMS, EMS reports CC of Right flank pain x 4 days and was seen by PCP and diagnose with UTI and started on antibiotics today. Hx of dementia, hard of hearing   148/82 HR 60- Paced RR 20 99% RA  Temp 98.1 oral  CBG 109

## 2020-11-14 DIAGNOSIS — K8020 Calculus of gallbladder without cholecystitis without obstruction: Secondary | ICD-10-CM | POA: Diagnosis not present

## 2020-11-14 NOTE — ED Provider Notes (Signed)
Care assumed from Dr. Vallery Ridge at shift change.  Patient presented here with pain to his right upper back.  Patient has undergone laboratory studies, CT of the chest, and is awaiting results of a right upper quadrant ultrasound.  This study has resulted and is unremarkable with the exception of gallstones.  Per Dr. Charlott Rakes communication with the patient's wife, if the study is unremarkable patient will be discharged to home with pain medication which has been already prescribed.  I informed the patient's wife of the results of the ultrasound.  Patient to be discharged to home with follow-up with primary doctor.   Veryl Speak, MD 11/14/20 [phone]

## 2020-11-14 NOTE — ED Notes (Signed)
Pt d/c by MD and is provided with d/c instructions and follow up care, Pt out of ED with family in wheel chair

## 2020-11-17 ENCOUNTER — Telehealth: Payer: Self-pay | Admitting: Neurology

## 2020-11-17 NOTE — Telephone Encounter (Signed)
Wife has called back to speak further with Erika,RN re: changes in pt and to discuss the idea of changing around some medications to better help pt.

## 2020-11-17 NOTE — Telephone Encounter (Signed)
[name] called today to let the nurse know his condition and to speak about what is going on. Best call back is [phone]

## 2020-11-17 NOTE — Telephone Encounter (Signed)
Called wife and she stated Jeffery Rogers was at [place] on Wednesday night.  She believes he is either having heart issues or a stroke.  He is in a wheelchair and doesn't believe they can make their visit this coming Thursday.  She is requesting for Dr. Krista Blue to look over his blood work and testing for more advice.    Wasn't able to continue to speak to patient's wife as she said she had to tend to her husband.

## 2020-11-18 NOTE — Telephone Encounter (Signed)
Spoke with Dr. Krista Blue, she will call back

## 2020-11-18 NOTE — Telephone Encounter (Signed)
The son Bade Valtierra(on DPR) states that Dr Krista Blue asked that he calls her, he can be reached at [phone]

## 2020-11-18 NOTE — Telephone Encounter (Signed)
I was able to talk with his son, patient has generalized weakness, deconditioning over the past few weeks in the setting of smoldering and active urinary tract infection, dehydration, polypharmacy, pain, constipation, was given oxycodone during his emergency room visit on November 13, 2020  His son denies localized signs, it is more of generalized deconditioning, likely due to worsening confusion, deconditioning in the setting of active UTI with background of dementia, polypharmacy, dehydration,  Has appointment pending February 17, in the meantime, keep well-hydrated.

## 2020-11-18 NOTE — Telephone Encounter (Signed)
I talked with his wife, patient was sent to the emergency room on November 13, 2020, complains of upper shoulder pain, evaluation failed to demonstrate etiology, he is on meloxicam, which helped the pain  Wife reported that he has sudden decline over the past 2 weeks, is in wheelchair now, also has difficulty controlling urine, has been under the care of urologist in the past few months, dark urine,  Patient was able to walk a mile daily 3 to 4 weeks ago, this is a sudden change over the past 2 weeks  I have advised patient's family to bring patient to ED to expedite the work-up if they worry about the possibility of stroke,  He has appointment scheduled with me on Thursday, November 20, 2020

## 2020-11-20 ENCOUNTER — Other Ambulatory Visit: Payer: Self-pay

## 2020-11-20 ENCOUNTER — Ambulatory Visit
Admission: RE | Admit: 2020-11-20 | Discharge: 2020-11-20 | Disposition: A | Discharge status: Home / Self Care | Payer: Medicare Other | Source: Ambulatory Visit | Attending: Neurology | Admitting: Neurology

## 2020-11-20 ENCOUNTER — Ambulatory Visit (INDEPENDENT_AMBULATORY_CARE_PROVIDER_SITE_OTHER): Payer: Medicare Other | Admitting: Neurology

## 2020-11-20 ENCOUNTER — Encounter: Payer: Self-pay | Admitting: *Deleted

## 2020-11-20 ENCOUNTER — Telehealth: Payer: Self-pay | Admitting: Neurology

## 2020-11-20 VITALS — BP 126/62 | HR 76 | Wt 172.0 lb

## 2020-11-20 DIAGNOSIS — F02818 Dementia in other diseases classified elsewhere, unspecified severity, with other behavioral disturbance: Secondary | ICD-10-CM

## 2020-11-20 DIAGNOSIS — G301 Alzheimer's disease with late onset: Secondary | ICD-10-CM

## 2020-11-20 DIAGNOSIS — F0281 Dementia in other diseases classified elsewhere with behavioral disturbance: Secondary | ICD-10-CM

## 2020-11-20 DIAGNOSIS — R4182 Altered mental status, unspecified: Secondary | ICD-10-CM | POA: Diagnosis not present

## 2020-11-20 DIAGNOSIS — R410 Disorientation, unspecified: Secondary | ICD-10-CM

## 2020-11-20 DIAGNOSIS — R531 Weakness: Secondary | ICD-10-CM | POA: Diagnosis not present

## 2020-11-20 NOTE — Telephone Encounter (Signed)
Medicare/bcbs supp no auth patient will walk into GI .

## 2020-11-20 NOTE — Progress Notes (Signed)
PATIENT: Jeffery Rogers DOB: [birth year]  Chief Complaint  Patient presents with  . Alzheimer's disease    One Year FU son- Jeffery Rogers "difficulty swallowing, very somnolent; waiting on bed in SNF"      HISTORICAL  Jeffery Rogers is 87 years old right-handed male, accompanied by [name] and [name] for evaluation of memory loss, tremor, dizziness, he is referred by his primary care physician [name], initial evaluation was on January 14 2017.  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  There was no significant family history of memory loss  Family history of bilateral hands tremor, gradually getting worse, no gait abnormality, his daughter also suffered bilateral hands tremor,  Dizziness since January 2018, no dizziness, lightheadedness in the sitting down or lying down position, most noticeable when he get up quickly from seated position, overnight sleeping, he has transient lightheadedness, he denies significant low back pain, no gait abnormality, no lower extremity paresthesia notice, but on today's examination was noted to have mild length dependent sensory changes, suggestive of peripheral neuropathy  He also complains of new onset intermittent binocular double vision during his vestibular rehabilitation only  UPDATE May 11 2017: Laboratory evaluations showed mild elevated A1c 5.8, otherwise negative or normal RPR, B12, TSH, CBC, acetylcholine receptor antibody, ESR C-reactive protein, positive  ANA, elevated CPK 546  Electrodiagnostic study today showed length dependent peripheral neuropathy, in addition, there is evidence of mild chronic neuropathic changes involving bilateral L4 nerve roots. He has intermittent low back pain, but denies low back pain now, denies significant gait abnormality.  The also personally reviewed CT head without contrast, generalized atrophy, mild supratentorium small vessel disease.  He continues to complain short term memroy loss, wife reported agitation occasionally.   UPDATE Nov 14 2017: He is accompanied by his family at today's clinic visit, he is overall doing very well, status 25 out of 30, continue to exercise regularly, mild increased bilateral hands tremor.  UPDATE Nov 15 2018: He had severe aortic stenosis, had transcatheter aortic valve replacement on May 30, 2018, recovered very well, on Eliquis treatment  He continue have slow decline memory loss, Mini-Mental Status Examination 23 out of 30 today, taking Aricept, Namenda, Seroquel 25 mg every night, sleep 10 to 12 hours every night  UPDATE June 4th 2020: He is accompanied by his wife at visit, he had fall on June 2nd 2020, he went out for a walk around 10:15 AM, without warning signs, his lower extremity give out underneath him, he denied loss of consciousness, had a bruise at his forehead, knees, his wife was able to get a phone call from his neighbor, pick him up about 5 minutes later, he was at his baseline,  He was checked at emergency room, I personally reviewed CT head, mild diffuse cortical atrophy, mild chronic ischemic white matter disease, no acute intracranial abnormality  CT of cervical spine showed multilevel degenerative disc disease, no acute abnormality  He fell few other times in the past, due to misjudgment of his steps,  UPDATE Nov 19 2019: He is accompanied by his wife at today's clinical visit, he experiences episode of sudden worsening in July 2020, to the point  of being violent, had involuntary admission to [facility] for few days, now back to home, is overall stable over the past few months, he is now on higher dose of Seroquel 25 mg 2 tablets every night, Zoloft 50 mg daily, also Aricept 10 mg daily, Namenda 10 mg twice a day, he sleeps well, has good appetite, still trying to be active, continue have slow worsening memory loss, today's Mini-Mental Status Examination is 22 out of 30  Laboratory evaluations in July 2020, negative alcohol, salicylate level less than 7, UDS was positive for benzodiazepine, no evidence of UTI, CMP showed mild elevated glucose 106 otherwise normal, CBC showed slight decrease platelet which is at his baseline Chest x-ray showed no acute cardiopulmonary disease,  UPDATE Nov 20 2020: He is accompanied by his son and wife at today's clinical visit, he was at his baseline about 3 weeks ago, had fairly decline over the past couple weeks, he has difficulty emptying his bladder, with urinary retention, now become fully dependent since January 2022, was treated with UTI since early February, on November 13, 2020, he was able to complain to his wife that he has right upper back pain, was evaluated at emergency room,  UA continue to show evidence of UTI, also elevated troponin 47, normal CBC, WBC of 10.5, hemoglobin of 14.5, negative lipase, CMP with calcium of 8.8, potassium of 0.98,  Ultrasound of abdomen showed cholelithiasis without secondary signs of acute cholecystitis  CT angiogram of chest showed no acute aortic syndrome, stable dilatation of the ascending thoracic aorta to 4 cm  He was given Rocephin, and discharged with p.o. Bactrim,  But over the past 1 week, he continued to decline, generalized weakness, no longer ambulatory, decreased p.o. intake, bloody urine by Foley today, more confused  REVIEW OF SYSTEMS: Full 14 system review of systems performed and notable only for as above  All rest review of the system were  negative ALLERGIES: No Known Allergies  HOME MEDICATIONS: Current Outpatient Medications  Medication Sig Dispense Refill  . ALPRAZolam (XANAX) 0.25 MG tablet Take 0.25 mg by mouth in the morning, at noon, and at bedtime.    Marland Kitchen amLODipine (NORVASC) 5 MG tablet Take 1 tablet by mouth daily. (Patient taking differently: 2.5 mg.) 90 tablet 2  . amoxicillin (AMOXIL) 500 MG tablet Take 4 tablets (2,000 mg) one hour prior to dental visits. 8 tablet 6  . apixaban (ELIQUIS) 5 MG TABS tablet Take 1 tablet (5 mg total) by mouth 2 (two) times daily. 180 tablet 3  . ARIPiprazole (ABILIFY) 10 MG tablet Take 1 tablet (10 mg total) by mouth daily. 30 tablet 6  . donepezil (ARICEPT) 10 MG tablet Take 1 tablet by mouth at bedtime. 90 tablet 2  . ipratropium (ATROVENT) 0.06 % nasal spray Place 1-2 sprays into both nostrils 4 (four) times daily as needed for rhinitis.    Marland Kitchen L-Methylfolate-Algae-B12-B6 (METANX) 3-90.314-2-35 MG CAPS Take 1 capsule by mouth 2 (two) times daily. 180 capsule 3  . meloxicam (MOBIC) 7.5 MG tablet Take 7.5 mg by mouth 2 (two) times daily.    . memantine (NAMENDA) 10 MG tablet Take 1 tablet by mouth twice daily. 180 tablet 2  . mirtazapine (REMERON) 7.5 MG tablet Take 7.5 mg by mouth daily.    Marland Kitchen oxyCODONE-acetaminophen (PERCOCET) 5-325 MG tablet Take 1-2 tablets by  mouth every 6 (six) hours as needed. 20 tablet 0  . QUEtiapine (SEROQUEL) 50 MG tablet Take 2 tablets (100 mg total) by mouth at bedtime. 60 tablet 11  . sertraline (ZOLOFT) 50 MG tablet Take 100 mg by mouth daily.     Marland Kitchen sulfamethoxazole-trimethoprim (BACTRIM DS) 800-160 MG tablet Take 1 tablet by mouth 2 (two) times daily.    Marland Kitchen atorvastatin (LIPITOR) 40 MG tablet Take 1 tablet (40 mg total) by mouth daily. (Patient not taking: Reported on 11/20/2020) 90 tablet 2  . Cholecalciferol (VITAMIN D-3) 1000 units CAPS Take 1,000 Units by mouth daily.  (Patient not taking: Reported on 11/20/2020)    . Loratadine (CLARITIN PO) Take by  mouth daily. (Patient not taking: Reported on 11/20/2020)    . tamsulosin (FLOMAX) 0.4 MG CAPS capsule Take 0.8 mg by mouth daily. (Patient not taking: Reported on 11/20/2020)    . vitamin C (ASCORBIC ACID) 500 MG tablet Take 500 mg by mouth daily. (Patient not taking: Reported on 11/20/2020)     No current facility-administered medications for this visit.    PAST MEDICAL HISTORY: Past Medical History:  Diagnosis Date  . Arthritis    "right arm/shoulder" (02/10/2016)  . Complete heart block ([place]) 02/10/2016   a. s/p Emergency planning/management officer  . Goiter   . Hypercholesteremia   . Memory loss   . Nasal septal deviation   . Persistent atrial fibrillation ([place])   . Presence of permanent cardiac pacemaker   . 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 Severe aortic stenosis   . Tremor     PAST SURGICAL HISTORY: Past Surgical History:  Procedure Laterality Date  . CATARACT EXTRACTION W/ INTRAOCULAR LENS  IMPLANT, BILATERAL Bilateral ~ 2000  . EP IMPLANTABLE DEVICE N/A 02/11/2016   Procedure: Pacemaker Implant;  Surgeon: Polito Lance, MD;  Location: [place] [facility];  Service: Cardiovascular;  Laterality: N/A;  . RIGHT/LEFT HEART CATH AND CORONARY ANGIOGRAPHY N/A 05/03/2018   Procedure: RIGHT/LEFT HEART CATH AND CORONARY ANGIOGRAPHY;  Surgeon: Burnell Blanks, MD;  Location: [place] [facility];  Service: Cardiovascular;  Laterality: N/A;  . TEE WITHOUT CARDIOVERSION N/A 05/30/2018   Procedure: TRANSESOPHAGEAL ECHOCARDIOGRAM (TEE);  Surgeon: Burnell Blanks, MD;  Location: [place];  Service: Open Heart Surgery;  Laterality: N/A;  . TONSILLECTOMY  1930s  . TRANSCATHETER AORTIC VALVE REPLACEMENT, TRANSFEMORAL N/A 05/30/2018   Procedure: TRANSCATHETER AORTIC VALVE REPLACEMENT, TRANSFEMORAL using a 27mm Edwards Sapien 3 Aortic Valve;  Surgeon: Burnell Blanks, MD;  Location: [place];  Service: Open Heart Surgery;  Laterality: N/A;  . WISDOM TOOTH EXTRACTION  1990s    FAMILY HISTORY: Family History  Problem Relation Age of Onset  . Stroke Other   . Irregular heart beat Other   . Stroke Mother   . Stroke Father   . CAD Father        MI    SOCIAL HISTORY:  Social History   Socioeconomic History  . Marital status: Married    Spouse name: Not on file  . Number of children: 2  . Years of education: Bachelors  . Highest education level: Not on file  Occupational History  . Occupation: Retired Chief Financial Officer  Tobacco Use  . Smoking status: Former Smoker    Packs/day: 1.00    Years: 16.00    Pack years: 16.00    Types: Cigarettes  .  Smokeless tobacco: Never Used  . Tobacco comment: Quit smoking cigarettes in 1971  Vaping Use  . Vaping Use: Never used  Substance and Sexual Activity  . Alcohol use: Never    Alcohol/week: 2.0 standard drinks    Types: 1 Cans of beer, 1 Shots of liquor per week  . Drug use: No  . Sexual activity: Yes  Other Topics Concern  . Not on file  Social History Narrative   Lives at home with his wife.   Right-handed.   1 cup coffee per day.  Occasional Coke.   Social Determinants of Health   Financial Resource Strain: Not on file  Food Insecurity: Not on file  Transportation Needs: Not on file  Physical Activity: Not on file  Stress: Not on file  Social Connections: Not on file  Intimate Partner Violence: Not on file     PHYSICAL EXAM   Vitals:   11/20/20 1013  BP: 126/62  Pulse: 76  Weight: 172 lb (78 kg)   Not recorded     Body mass index is 24.68 kg/m.  PHYSICAL EXAMNIATION:  Gen: NAD, conversant, well nourised, well groomed                     Cardiovascular: Regular rate rhythm, no peripheral edema, warm, nontender. Eyes: Conjunctivae clear without exudates or hemorrhage Neck: Supple, no carotid bruits. Pulmonary: Clear to auscultation bilaterally   NEUROLOGICAL EXAM:  MMSE  - Mini Mental State Exam 11/19/2019 11/15/2018 11/14/2017  Orientation to time 1 1 3   Orientation to Place 4 5 5   Registration 3 3 3   Attention/ Calculation 5 5 5   Recall 0 0 0  Language- name 2 objects 2 2 2   Language- repeat 1 1 1   Language- follow 3 step command 3 3 3   Language- read & follow direction 1 1 1   Write a sentence 1 1 1   Copy design 1 1 1   Total score 22 23 25   Animal naming 7  MENTAL STATUS: Speech/cognition He sits in wheelchair, confused, trying to follow command, able to count fingers, slurred slow speech  CRANIAL NERVES: CN II: Visual fields are full to confrontation.Pupils are small equal and reactive to light. CN III, IV, VI: extraocular movement are normal. No ptosis.  CN V: Facial sensation is intact to light touch CN VII: Face is symmetric with normal eye closure and smile. CN VIII: Hard of hearing, wearing hearing aid CN IX, X: Slurred speech CN XI: Head turning and shoulder shrug are intact  MOTOR: Able to move upper and lower extremity against gravity  REFLEXES: Reflexes are 1 and symmetric at the biceps, triceps, knees, absent at ankles. Plantar responses are flexor.  COORDINATION: No limb or trunk dysmetria noted,  GAIT/STANCE: Deferred   DIAGNOSTIC DATA (LABS, IMAGING, TESTING) - I reviewed patient records, labs, notes, testing and imaging myself where available.   ASSESSMENT AND PLAN  DIO GILLER is a 87 y.o. male   Dementia  Rapid decline of functional status in the setting of active UTI  We went over his medication list in detail, will only keep Eliquis 5 mg twice a day, antibiotics, quetiapine 50 mg half to 1 tablet as needed for sleep  Repeat laboratory evaluations CMP, CBC,  Stat CT head to rule out new event  Family is discussing nursing home replacement         Jeffery Rogers, M.D. Ph.D.  [facility] [address] [place], [place] [address]  Ph: [phone] Fax: [phone]  CC: [name], MD

## 2020-11-21 ENCOUNTER — Inpatient Hospital Stay (HOSPITAL_COMMUNITY)
Admission: EM | Admit: 2020-11-21 | Discharge: 2020-11-26 | DRG: 690 | Disposition: A | Discharge status: Skilled Nursing Facility | Payer: Medicare Other | Attending: Family Medicine | Admitting: Family Medicine

## 2020-11-21 ENCOUNTER — Ambulatory Visit (HOSPITAL_COMMUNITY): Admit: 2020-11-21 | Payer: Medicare Other

## 2020-11-21 ENCOUNTER — Encounter (HOSPITAL_COMMUNITY): Payer: Self-pay | Admitting: Internal Medicine

## 2020-11-21 ENCOUNTER — Telehealth: Payer: Self-pay | Admitting: Neurology

## 2020-11-21 ENCOUNTER — Emergency Department (HOSPITAL_COMMUNITY): Payer: Medicare Other

## 2020-11-21 ENCOUNTER — Ambulatory Visit (HOSPITAL_COMMUNITY): Payer: Medicare Other

## 2020-11-21 ENCOUNTER — Encounter (HOSPITAL_COMMUNITY): Payer: Self-pay

## 2020-11-21 ENCOUNTER — Ambulatory Visit (HOSPITAL_COMMUNITY)
Admission: EM | Admit: 2020-11-21 | Discharge: 2020-11-21 | Disposition: A | Discharge status: Home / Self Care | Payer: Medicare Other | Source: Home / Self Care | Attending: Internal Medicine | Admitting: Internal Medicine

## 2020-11-21 ENCOUNTER — Other Ambulatory Visit: Payer: Self-pay

## 2020-11-21 DIAGNOSIS — Z952 Presence of prosthetic heart valve: Secondary | ICD-10-CM

## 2020-11-21 DIAGNOSIS — Z950 Presence of cardiac pacemaker: Secondary | ICD-10-CM

## 2020-11-21 DIAGNOSIS — I10 Essential (primary) hypertension: Secondary | ICD-10-CM | POA: Diagnosis present

## 2020-11-21 DIAGNOSIS — R778 Other specified abnormalities of plasma proteins: Secondary | ICD-10-CM | POA: Diagnosis present

## 2020-11-21 DIAGNOSIS — G250 Essential tremor: Secondary | ICD-10-CM | POA: Diagnosis present

## 2020-11-21 DIAGNOSIS — F0151 Vascular dementia with behavioral disturbance: Secondary | ICD-10-CM | POA: Diagnosis present

## 2020-11-21 DIAGNOSIS — R41841 Cognitive communication deficit: Secondary | ICD-10-CM | POA: Diagnosis not present

## 2020-11-21 DIAGNOSIS — K8080 Other cholelithiasis without obstruction: Secondary | ICD-10-CM | POA: Diagnosis not present

## 2020-11-21 DIAGNOSIS — G9340 Encephalopathy, unspecified: Secondary | ICD-10-CM | POA: Diagnosis present

## 2020-11-21 DIAGNOSIS — F0281 Dementia in other diseases classified elsewhere with behavioral disturbance: Secondary | ICD-10-CM | POA: Diagnosis present

## 2020-11-21 DIAGNOSIS — N281 Cyst of kidney, acquired: Secondary | ICD-10-CM | POA: Diagnosis not present

## 2020-11-21 DIAGNOSIS — Z20822 Contact with and (suspected) exposure to covid-19: Secondary | ICD-10-CM | POA: Diagnosis present

## 2020-11-21 DIAGNOSIS — R748 Abnormal levels of other serum enzymes: Secondary | ICD-10-CM | POA: Diagnosis present

## 2020-11-21 DIAGNOSIS — R5381 Other malaise: Secondary | ICD-10-CM | POA: Diagnosis present

## 2020-11-21 DIAGNOSIS — Z791 Long term (current) use of non-steroidal anti-inflammatories (NSAID): Secondary | ICD-10-CM

## 2020-11-21 DIAGNOSIS — Z515 Encounter for palliative care: Secondary | ICD-10-CM

## 2020-11-21 DIAGNOSIS — I517 Cardiomegaly: Secondary | ICD-10-CM | POA: Diagnosis not present

## 2020-11-21 DIAGNOSIS — R338 Other retention of urine: Secondary | ICD-10-CM | POA: Diagnosis not present

## 2020-11-21 DIAGNOSIS — F419 Anxiety disorder, unspecified: Secondary | ICD-10-CM | POA: Diagnosis present

## 2020-11-21 DIAGNOSIS — Z7901 Long term (current) use of anticoagulants: Secondary | ICD-10-CM | POA: Diagnosis not present

## 2020-11-21 DIAGNOSIS — R319 Hematuria, unspecified: Secondary | ICD-10-CM

## 2020-11-21 DIAGNOSIS — R310 Gross hematuria: Secondary | ICD-10-CM | POA: Diagnosis present

## 2020-11-21 DIAGNOSIS — R4182 Altered mental status, unspecified: Secondary | ICD-10-CM | POA: Diagnosis not present

## 2020-11-21 DIAGNOSIS — Z6823 Body mass index (BMI) 23.0-23.9, adult: Secondary | ICD-10-CM

## 2020-11-21 DIAGNOSIS — F0394 Unspecified dementia, unspecified severity, with anxiety: Secondary | ICD-10-CM | POA: Diagnosis present

## 2020-11-21 DIAGNOSIS — R079 Chest pain, unspecified: Secondary | ICD-10-CM | POA: Diagnosis not present

## 2020-11-21 DIAGNOSIS — F05 Delirium due to known physiological condition: Secondary | ICD-10-CM | POA: Diagnosis present

## 2020-11-21 DIAGNOSIS — Z66 Do not resuscitate: Secondary | ICD-10-CM | POA: Diagnosis present

## 2020-11-21 DIAGNOSIS — Z953 Presence of xenogenic heart valve: Secondary | ICD-10-CM

## 2020-11-21 DIAGNOSIS — F0391 Unspecified dementia with behavioral disturbance: Secondary | ICD-10-CM | POA: Diagnosis present

## 2020-11-21 DIAGNOSIS — R278 Other lack of coordination: Secondary | ICD-10-CM | POA: Diagnosis not present

## 2020-11-21 DIAGNOSIS — R404 Transient alteration of awareness: Secondary | ICD-10-CM | POA: Diagnosis not present

## 2020-11-21 DIAGNOSIS — J320 Chronic maxillary sinusitis: Secondary | ICD-10-CM | POA: Diagnosis not present

## 2020-11-21 DIAGNOSIS — E7800 Pure hypercholesterolemia, unspecified: Secondary | ICD-10-CM | POA: Diagnosis present

## 2020-11-21 DIAGNOSIS — R0902 Hypoxemia: Secondary | ICD-10-CM | POA: Diagnosis present

## 2020-11-21 DIAGNOSIS — R410 Disorientation, unspecified: Secondary | ICD-10-CM | POA: Diagnosis not present

## 2020-11-21 DIAGNOSIS — Z7189 Other specified counseling: Secondary | ICD-10-CM | POA: Diagnosis not present

## 2020-11-21 DIAGNOSIS — Z978 Presence of other specified devices: Secondary | ICD-10-CM

## 2020-11-21 DIAGNOSIS — N390 Urinary tract infection, site not specified: Secondary | ICD-10-CM

## 2020-11-21 DIAGNOSIS — R2681 Unsteadiness on feet: Secondary | ICD-10-CM | POA: Diagnosis not present

## 2020-11-21 DIAGNOSIS — F02818 Dementia in other diseases classified elsewhere, unspecified severity, with other behavioral disturbance: Secondary | ICD-10-CM | POA: Diagnosis present

## 2020-11-21 DIAGNOSIS — Z87891 Personal history of nicotine dependence: Secondary | ICD-10-CM | POA: Diagnosis not present

## 2020-11-21 DIAGNOSIS — M2550 Pain in unspecified joint: Secondary | ICD-10-CM | POA: Diagnosis not present

## 2020-11-21 DIAGNOSIS — I6782 Cerebral ischemia: Secondary | ICD-10-CM | POA: Diagnosis not present

## 2020-11-21 DIAGNOSIS — Z7401 Bed confinement status: Secondary | ICD-10-CM | POA: Diagnosis not present

## 2020-11-21 DIAGNOSIS — M6281 Muscle weakness (generalized): Secondary | ICD-10-CM | POA: Diagnosis not present

## 2020-11-21 DIAGNOSIS — G301 Alzheimer's disease with late onset: Secondary | ICD-10-CM | POA: Diagnosis present

## 2020-11-21 DIAGNOSIS — I442 Atrioventricular block, complete: Secondary | ICD-10-CM | POA: Diagnosis present

## 2020-11-21 DIAGNOSIS — R531 Weakness: Secondary | ICD-10-CM | POA: Diagnosis not present

## 2020-11-21 DIAGNOSIS — R1310 Dysphagia, unspecified: Secondary | ICD-10-CM | POA: Diagnosis not present

## 2020-11-21 DIAGNOSIS — N3000 Acute cystitis without hematuria: Secondary | ICD-10-CM | POA: Diagnosis not present

## 2020-11-21 DIAGNOSIS — R413 Other amnesia: Secondary | ICD-10-CM | POA: Diagnosis present

## 2020-11-21 DIAGNOSIS — F0390 Unspecified dementia without behavioral disturbance: Secondary | ICD-10-CM | POA: Diagnosis not present

## 2020-11-21 DIAGNOSIS — I350 Nonrheumatic aortic (valve) stenosis: Secondary | ICD-10-CM | POA: Diagnosis not present

## 2020-11-21 DIAGNOSIS — I4819 Other persistent atrial fibrillation: Secondary | ICD-10-CM | POA: Diagnosis present

## 2020-11-21 DIAGNOSIS — R627 Adult failure to thrive: Secondary | ICD-10-CM | POA: Diagnosis present

## 2020-11-21 DIAGNOSIS — Z79899 Other long term (current) drug therapy: Secondary | ICD-10-CM | POA: Diagnosis not present

## 2020-11-21 DIAGNOSIS — M48061 Spinal stenosis, lumbar region without neurogenic claudication: Secondary | ICD-10-CM | POA: Diagnosis not present

## 2020-11-21 DIAGNOSIS — M47816 Spondylosis without myelopathy or radiculopathy, lumbar region: Secondary | ICD-10-CM | POA: Diagnosis not present

## 2020-11-21 DIAGNOSIS — R29898 Other symptoms and signs involving the musculoskeletal system: Secondary | ICD-10-CM | POA: Diagnosis not present

## 2020-11-21 DIAGNOSIS — M47817 Spondylosis without myelopathy or radiculopathy, lumbosacral region: Secondary | ICD-10-CM | POA: Diagnosis not present

## 2020-11-21 DIAGNOSIS — R9431 Abnormal electrocardiogram [ECG] [EKG]: Secondary | ICD-10-CM | POA: Diagnosis not present

## 2020-11-21 DIAGNOSIS — R109 Unspecified abdominal pain: Secondary | ICD-10-CM | POA: Diagnosis not present

## 2020-11-21 DIAGNOSIS — G309 Alzheimer's disease, unspecified: Secondary | ICD-10-CM | POA: Diagnosis present

## 2020-11-21 DIAGNOSIS — R269 Unspecified abnormalities of gait and mobility: Secondary | ICD-10-CM | POA: Diagnosis not present

## 2020-11-21 DIAGNOSIS — I959 Hypotension, unspecified: Secondary | ICD-10-CM | POA: Diagnosis not present

## 2020-11-21 DIAGNOSIS — J9811 Atelectasis: Secondary | ICD-10-CM | POA: Diagnosis not present

## 2020-11-21 DIAGNOSIS — K8020 Calculus of gallbladder without cholecystitis without obstruction: Secondary | ICD-10-CM | POA: Diagnosis not present

## 2020-11-21 LAB — COMPREHENSIVE METABOLIC PANEL
ALT: 40 IU/L (ref 0–44)
ALT: 48 U/L — ABNORMAL HIGH (ref 0–44)
AST: 52 IU/L — ABNORMAL HIGH (ref 0–40)
AST: 60 U/L — ABNORMAL HIGH (ref 15–41)
Albumin/Globulin Ratio: 1.7 (ref 1.2–2.2)
Albumin: 4.3 g/dL (ref 3.6–4.6)
Albumin: 4.2 g/dL (ref 3.5–5.0)
Alkaline Phosphatase: 133 IU/L — ABNORMAL HIGH (ref 44–121)
Alkaline Phosphatase: 105 U/L (ref 38–126)
Anion gap: 11 (ref 5–15)
BUN/Creatinine Ratio: 18 (ref 10–24)
BUN: 23 mg/dL (ref 8–27)
BUN: 24 mg/dL — ABNORMAL HIGH (ref 8–23)
Bilirubin Total: 0.6 mg/dL (ref 0.0–1.2)
CO2: 18 mmol/L — ABNORMAL LOW (ref 20–29)
CO2: 23 mmol/L (ref 22–32)
Calcium: 9.5 mg/dL (ref 8.6–10.2)
Calcium: 9.3 mg/dL (ref 8.9–10.3)
Chloride: 98 mmol/L (ref 96–106)
Chloride: 100 mmol/L (ref 98–111)
Creatinine, Ser: 1.26 mg/dL (ref 0.76–1.27)
Creatinine, Ser: 1.24 mg/dL (ref 0.61–1.24)
GFR calc Af Amer: 59 mL/min/{1.73_m2} — ABNORMAL LOW (ref >59)
GFR calc non Af Amer: 51 mL/min/{1.73_m2} — ABNORMAL LOW (ref >59)
GFR, Estimated: 57 mL/min — ABNORMAL LOW (ref >60)
Globulin, Total: 2.6 g/dL (ref 1.5–4.5)
Glucose, Bld: 71 mg/dL (ref 70–99)
Glucose: 83 mg/dL (ref 65–99)
Potassium: 4.7 mmol/L (ref 3.5–5.2)
Potassium: 4.3 mmol/L (ref 3.5–5.1)
Sodium: 135 mmol/L (ref 134–144)
Sodium: 134 mmol/L — ABNORMAL LOW (ref 135–145)
Total Bilirubin: 0.9 mg/dL (ref 0.3–1.2)
Total Protein: 6.9 g/dL (ref 6.0–8.5)
Total Protein: 7.5 g/dL (ref 6.5–8.1)

## 2020-11-21 LAB — CBC WITH DIFFERENTIAL/PLATELET
Abs Immature Granulocytes: 0.05 10*3/uL (ref 0.00–0.07)
Basophils Absolute: 0.1 10*3/uL (ref 0.0–0.2)
Basophils Absolute: 0 10*3/uL (ref 0.0–0.1)
Basophils Relative: 1 %
Basos: 1 %
EOS (ABSOLUTE): 0.1 10*3/uL (ref 0.0–0.4)
Eos: 2 %
Eosinophils Absolute: 0.2 10*3/uL (ref 0.0–0.5)
Eosinophils Relative: 2 %
HCT: 48.4 % (ref 39.0–52.0)
Hematocrit: 43.5 % (ref 37.5–51.0)
Hemoglobin: 14.9 g/dL (ref 13.0–17.7)
Hemoglobin: 15.7 g/dL (ref 13.0–17.0)
Immature Grans (Abs): 0 10*3/uL (ref 0.0–0.1)
Immature Granulocytes: 0 %
Immature Granulocytes: 1 %
Lymphocytes Absolute: 1.3 10*3/uL (ref 0.7–3.1)
Lymphocytes Relative: 13 %
Lymphs Abs: 1 10*3/uL (ref 0.7–4.0)
Lymphs: 15 %
MCH: 31.5 pg (ref 26.6–33.0)
MCH: 30.9 pg (ref 26.0–34.0)
MCHC: 34.3 g/dL (ref 31.5–35.7)
MCHC: 32.4 g/dL (ref 30.0–36.0)
MCV: 92 fL (ref 79–97)
MCV: 95.3 fL (ref 80.0–100.0)
Monocytes Absolute: 0.7 10*3/uL (ref 0.1–0.9)
Monocytes Absolute: 0.7 10*3/uL (ref 0.1–1.0)
Monocytes Relative: 8 %
Monocytes: 7 %
Neutro Abs: 6.1 10*3/uL (ref 1.7–7.7)
Neutrophils Absolute: 7 10*3/uL (ref 1.4–7.0)
Neutrophils Relative %: 75 %
Neutrophils: 75 %
Platelets: 168 10*3/uL (ref 150–450)
Platelets: 151 10*3/uL (ref 150–400)
RBC: 4.73 x10E6/uL (ref 4.14–5.80)
RBC: 5.08 MIL/uL (ref 4.22–5.81)
RDW: 12.9 % (ref 11.6–15.4)
RDW: 13.8 % (ref 11.5–15.5)
WBC: 9.3 10*3/uL (ref 3.4–10.8)
WBC: 8 10*3/uL (ref 4.0–10.5)
nRBC: 0 % (ref 0.0–0.2)

## 2020-11-21 LAB — TSH: TSH: 2.62 u[IU]/mL (ref 0.450–4.500)

## 2020-11-21 LAB — HIV ANTIBODY (ROUTINE TESTING W REFLEX): HIV Screen 4th Generation wRfx: NONREACTIVE

## 2020-11-21 LAB — TROPONIN I (HIGH SENSITIVITY)
Troponin I (High Sensitivity): 41 ng/L — ABNORMAL HIGH (ref <18)
Troponin I (High Sensitivity): 40 ng/L — ABNORMAL HIGH (ref <18)

## 2020-11-21 LAB — LACTIC ACID, PLASMA: Lactic Acid, Venous: 1.4 mmol/L (ref 0.5–1.9)

## 2020-11-21 LAB — RESP PANEL BY RT-PCR (FLU A&B, COVID) ARPGX2
Influenza A by PCR: NEGATIVE
Influenza B by PCR: NEGATIVE
SARS Coronavirus 2 by RT PCR: NEGATIVE

## 2020-11-21 LAB — CK: Total CK: 461 U/L — ABNORMAL HIGH (ref 49–397)

## 2020-11-21 MED ORDER — ACETAMINOPHEN 325 MG PO TABS
650.0000 mg | ORAL_TABLET | ORAL | Status: DC | PRN
  Administered 2020-11-22: 650 mg via ORAL
  Filled 2020-11-21: qty 2

## 2020-11-21 MED ORDER — SENNOSIDES-DOCUSATE SODIUM 8.6-50 MG PO TABS: 1.0000 | ORAL_TABLET | Freq: Every evening | ORAL | Status: DC | PRN

## 2020-11-21 MED ORDER — IOHEXOL 350 MG/ML SOLN
100.0000 mL | Freq: Once | INTRAVENOUS | Status: AC | PRN
  Administered 2020-11-21: 100 mL via INTRAVENOUS

## 2020-11-21 MED ORDER — LACTATED RINGERS IV BOLUS
250.0000 mL | Freq: Once | INTRAVENOUS | Status: AC
  Administered 2020-11-21: 250 mL via INTRAVENOUS

## 2020-11-21 MED ORDER — SODIUM CHLORIDE 0.9 % IV SOLN
1.0000 g | Freq: Once | INTRAVENOUS | Status: AC
  Administered 2020-11-21: 1 g via INTRAVENOUS
  Filled 2020-11-21: qty 10

## 2020-11-21 MED ORDER — ALPRAZOLAM 0.25 MG PO TABS: 0.2500 mg | ORAL_TABLET | Freq: Three times a day (TID) | ORAL | Status: DC | PRN

## 2020-11-21 MED ORDER — STROKE: EARLY STAGES OF RECOVERY BOOK: Freq: Once | Status: DC

## 2020-11-21 MED ORDER — SODIUM CHLORIDE 0.9 % IV SOLN: INTRAVENOUS | Status: DC

## 2020-11-21 MED ORDER — POLYVINYL ALCOHOL 1.4 % OP SOLN: 1.0000 [drp] | OPHTHALMIC | Status: DC | PRN

## 2020-11-21 MED ORDER — ACETAMINOPHEN 160 MG/5ML PO SOLN: 650.0000 mg | ORAL | Status: DC | PRN

## 2020-11-21 MED ORDER — SODIUM CHLORIDE 0.9 % IV SOLN
2.0000 g | INTRAVENOUS | Status: DC
  Administered 2020-11-22 – 2020-11-25 (×4): 2 g via INTRAVENOUS
  Filled 2020-11-21 (×3): qty 2
  Filled 2020-11-21: qty 20
  Filled 2020-11-21: qty 2

## 2020-11-21 MED ORDER — FENTANYL CITRATE (PF) 100 MCG/2ML IJ SOLN
50.0000 ug | Freq: Once | INTRAMUSCULAR | Status: AC
  Administered 2020-11-21: 50 ug via INTRAVENOUS
  Filled 2020-11-21: qty 2

## 2020-11-21 MED ORDER — AMLODIPINE BESYLATE 5 MG PO TABS
2.5000 mg | ORAL_TABLET | Freq: Every day | ORAL | Status: DC
  Administered 2020-11-23: 2.5 mg via ORAL
  Filled 2020-11-21: qty 1

## 2020-11-21 MED ORDER — ACETAMINOPHEN 650 MG RE SUPP: 650.0000 mg | RECTAL | Status: DC | PRN

## 2020-11-21 MED ORDER — CHLORHEXIDINE GLUCONATE CLOTH 2 % EX PADS
6.0000 | MEDICATED_PAD | Freq: Every day | CUTANEOUS | Status: DC
  Administered 2020-11-22 – 2020-11-25 (×4): 6 via TOPICAL

## 2020-11-21 MED ORDER — ALPRAZOLAM 0.25 MG PO TABS
0.2500 mg | ORAL_TABLET | Freq: Once | ORAL | Status: AC
  Administered 2020-11-21: 0.25 mg via ORAL
  Filled 2020-11-21: qty 1

## 2020-11-21 NOTE — ED Notes (Signed)
Patient being transported to XR/CT at this time.   

## 2020-11-21 NOTE — Telephone Encounter (Signed)
I called his son, CT of the head showed no acute abnormality, small left PCA infarction at the left occipital pole, which is new since 2018, but appears chronic, possible unruptured aneurysm at the basilar tip 6 to 7 mm, stable since 2018  Chronic right maxillary sinusitis  Laboratory evaluation showed normal TSH, WBC was 9.3, hemoglobin was 14.9, creatinine was 1.26, mild elevation of alkaline phosphate, AST  He is taken to the emergency room due to increased confusion, less responsiveness, decreased p.o. intake, and bloody urine by Foley,

## 2020-11-21 NOTE — Telephone Encounter (Signed)
Courtesy call from the ED, patient is at [facility]. Being admitted.

## 2020-11-21 NOTE — ED Provider Notes (Signed)
I provided a substantive portion of the care of this patient.  I personally performed the entirety of the medical decision making for this encounter.  EKG Interpretation  Date/Time:  Friday November 21 2020 10:24:31 EST Ventricular Rate:  60 PR Interval:    QRS Duration: 174 QT Interval:  481 QTC Calculation: 481 R Axis:   -82 Text Interpretation: Junctional rhythm Nonspecific IVCD with LAD Left ventricular hypertrophy Inferior infarct, acute (RCA) Anterolateral infarct, old Probable RV involvement, suggest recording right precordial leads No significant change since last tracing Confirmed by Lacretia Leigh [phone]) on 11/21/2020 11:16:64 AM  87 year old male here with altered mental status.  Patient has had gradual decrease in cognitive function according to son over several weeks.  Had a negative head CT yesterday.  Has had multiple UTIs.  Will check urine as well as image patient's abdomen chest due to new oxygen requirement as well as flank pain.  Patient will require admission   Lacretia Leigh, MD 11/21/20 1151

## 2020-11-21 NOTE — ED Triage Notes (Signed)
Transported by [facility] from urology office (pt is from home)-- recently placed on Rocephin and Bactrim on 2/10 for recurrent UTI (foley catheter in place). Family denies fever/chills, but does report weakness, decrease in mental status/ PO intake. AAO x2-- baseline.

## 2020-11-21 NOTE — H&P (Signed)
History and Physical    Jeffery Rogers DGU:[id number] DOB: 01-May-1932 DOA: 11/21/2020  PCP: [name], MD  Patient coming from: Urology office  Chief Complaint: generalized weakness and mentation decline.  HPI: Jeffery Rogers is a 87 y.o. male with medical history significant of complete heart block s/p pacer, a fib, HLD, dementia. History is from son at bedside as patient has dementia and is a poor historian. His son reports that at the beginning of January, the patient was able to perform all his ADLs and walk on his own. Over the last three weeks or so, he's had a steady decline and mentation and function. The son has noticed that he has increased weakness in his legs. He is now unable to walk on his own. He is unable to perform his ADLs. He is more confused and less interactive. He has been to his neurologist who did a [place]. It was negative for acute stroke. He has been seen by urology for UTI and the ED for abdominal pain. He was started on abx for a presumed UTI. However, his overall functional status has not improved. His son felt it was time to return to the ED for assistance.   ED Course: No acute findings on CTA PE or CT ab/pelvis. He had a brief episode of hypoxia that has since resolved. There was concern for stroke. The EDP spoke with neurology. Recommended MRI. TRH was called for admission.   Review of Systems:  Unable to obtain d/t mentation.   PMHx Past Medical History:  Diagnosis Date  . Arthritis    "right arm/shoulder" (02/10/2016)  . Complete heart block ([place]) 02/10/2016   a. s/p Emergency planning/management officer  . Goiter   . Hypercholesteremia   . Memory loss   . Nasal septal deviation   . Persistent atrial fibrillation ([place])   . Presence of permanent cardiac pacemaker   . 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 Severe aortic stenosis   . Tremor      PSHx Past Surgical History:  Procedure Laterality Date  . CATARACT EXTRACTION W/ INTRAOCULAR LENS  IMPLANT, BILATERAL Bilateral ~ 2000  . EP IMPLANTABLE DEVICE N/A 02/11/2016   Procedure: Pacemaker Implant;  Surgeon: Pask Lance, MD;  Location: [facility];  Service: Cardiovascular;  Laterality: N/A;  . RIGHT/LEFT HEART CATH AND CORONARY ANGIOGRAPHY N/A 05/03/2018   Procedure: RIGHT/LEFT HEART CATH AND CORONARY ANGIOGRAPHY;  Surgeon: Burnell Blanks, MD;  Location: [facility];  Service: Cardiovascular;  Laterality: N/A;  . TEE WITHOUT CARDIOVERSION N/A 05/30/2018   Procedure: TRANSESOPHAGEAL ECHOCARDIOGRAM (TEE);  Surgeon: Burnell Blanks, MD;  Location: [facility];  Service: Open Heart Surgery;  Laterality: N/A;  . TONSILLECTOMY  1930s  . TRANSCATHETER AORTIC VALVE REPLACEMENT, TRANSFEMORAL N/A 05/30/2018   Procedure: TRANSCATHETER AORTIC VALVE REPLACEMENT, TRANSFEMORAL using a 49mm Edwards Sapien 3 Aortic Valve;  Surgeon: Burnell Blanks, MD;  Location: [facility];  Service: Open Heart Surgery;  Laterality: N/A;  . WISDOM TOOTH EXTRACTION  1990s    SocHx  reports that he has quit smoking. His smoking use included cigarettes. He has a 16.00 pack-year smoking history. He has never used smokeless tobacco. He reports that he does not drink alcohol and does not use drugs.  No Known Allergies  FamHx Family History  Problem Relation Age of Onset  . Stroke Other   .  Irregular heart beat Other   . Stroke Mother   . Stroke Father   . CAD Father        MI    Prior to Admission medications   Medication Sig Start Date End Date Taking? Authorizing Provider  ALPRAZolam (XANAX) 0.25 MG tablet Take 0.25 mg by mouth in the morning, at noon, and at bedtime.    [provider]  amLODipine (NORVASC) 5 MG tablet Take 1 tablet by mouth daily. Patient taking differently: 2.5 mg. 04/15/20   Eileen Stanford, PA-C  amoxicillin (AMOXIL) 500 MG tablet Take 4  tablets (2,000 mg) one hour prior to dental visits. 08/23/19   Eileen Stanford, PA-C  apixaban (ELIQUIS) 5 MG TABS tablet Take 1 tablet (5 mg total) by mouth 2 (two) times daily. 02/08/17   Bagby Lance, MD  ARIPiprazole (ABILIFY) 10 MG tablet Take 1 tablet (10 mg total) by mouth daily. 09/03/20   Marcial Pacas, MD  atorvastatin (LIPITOR) 40 MG tablet Take 1 tablet (40 mg total) by mouth daily. Patient not taking: Reported on 11/20/2020 05/24/17   Karges Lance, MD  Cholecalciferol (VITAMIN D-3) 1000 units CAPS Take 1,000 Units by mouth daily.  Patient not taking: Reported on 11/20/2020    [provider]  donepezil (ARICEPT) 10 MG tablet Take 1 tablet by mouth at bedtime. 03/17/20   Marcial Pacas, MD  ipratropium (ATROVENT) 0.06 % nasal spray Place 1-2 sprays into both nostrils 4 (four) times daily as needed for rhinitis.    [provider]  L-Methylfolate-Algae-B12-B6 Glade Stanford) 3-90.314-2-35 MG CAPS Take 1 capsule by mouth 2 (two) times daily. 03/25/20   Marcial Pacas, MD  Loratadine (CLARITIN PO) Take by mouth daily. Patient not taking: Reported on 11/20/2020    [provider]  meloxicam (MOBIC) 7.5 MG tablet Take 7.5 mg by mouth 2 (two) times daily. 11/14/20   [provider]  memantine (NAMENDA) 10 MG tablet Take 1 tablet by mouth twice daily. 03/17/20   Marcial Pacas, MD  mirtazapine (REMERON) 7.5 MG tablet Take 7.5 mg by mouth daily. 02/19/20   [provider]  oxyCODONE-acetaminophen (PERCOCET) 5-325 MG tablet Take 1-2 tablets by mouth every 6 (six) hours as needed. 11/13/20   Charlesetta Shanks, MD  QUEtiapine (SEROQUEL) 50 MG tablet Take 2 tablets (100 mg total) by mouth at bedtime. 09/03/20   Marcial Pacas, MD  sertraline (ZOLOFT) 100 MG tablet Take 100 mg by mouth daily. 11/10/20   [provider]  sertraline (ZOLOFT) 50 MG tablet Take 100 mg by mouth daily.     [provider]  sulfamethoxazole-trimethoprim (BACTRIM DS) 800-160 MG tablet Take 1  tablet by mouth 2 (two) times daily. 11/13/20   [provider]  tamsulosin (FLOMAX) 0.4 MG CAPS capsule Take 0.8 mg by mouth daily. Patient not taking: Reported on 11/20/2020 08/17/20   [provider]  vitamin C (ASCORBIC ACID) 500 MG tablet Take 500 mg by mouth daily. Patient not taking: Reported on 11/20/2020    [provider]    Physical Exam: Vitals:   11/21/20 1215 11/21/20 1300 11/21/20 1359 11/21/20 1449  BP: (!) 127/59 (!) 159/57 134/70 (!) 143/57  Pulse: 60 60 60 60  Resp: 11 18 15 15   Temp:      TempSrc:      SpO2: 100% 100% 100% 100%    General: 87 y.o. male resting in bed in NAD Eyes: PERRL, normal sclera ENMT: Nares patent w/o discharge, orophaynx clear, dentition  normal, ears w/o discharge/lesions/ulcers Neck: Supple, trachea midline Cardiovascular: brady, +S1, S2, no g/r, 1/2 SEM equal pulses throughout Respiratory: CTABL, no w/r/r, normal WOB GI: BS+, NDNT, no masses noted, no organomegaly noted MSK: No e/c/c Neuro: A&O x to name only, hard of hearing, BLE weakness/numbness Psyc: Appropriate interaction and affect, calm/cooperative  Labs on Admission: I have personally reviewed following labs and imaging studies  CBC: Recent Labs  Lab 11/20/20 1123 11/21/20 1034  WBC 9.3 8.0  NEUTROABS 7.0 6.1  HGB 14.9 15.7  HCT 43.5 48.4  MCV 92 95.3  PLT 168 323   Basic Metabolic Panel: Recent Labs  Lab 11/20/20 1123 11/21/20 1034  NA 135 134*  K 4.7 4.3  CL 98 100  CO2 18* 23  GLUCOSE 83 71  BUN 23 24*  CREATININE 1.26 1.24  CALCIUM 9.5 9.3   GFR: Estimated Creatinine Clearance: 44.2 mL/min (by C-G formula based on SCr of 1.24 mg/dL). Liver Function Tests: Recent Labs  Lab 11/20/20 1123 11/21/20 1034  AST 52* 60*  ALT 40 48*  ALKPHOS 133* 105  BILITOT 0.6 0.9  PROT 6.9 7.5  ALBUMIN 4.3 4.2   No results for input(s): LIPASE, AMYLASE in the last 168 hours. No results for input(s): AMMONIA in the last 168  hours. Coagulation Profile: No results for input(s): INR, PROTIME in the last 168 hours. Cardiac Enzymes: Recent Labs  Lab 11/21/20 1034  CKTOTAL 461*   BNP (last 3 results) No results for input(s): PROBNP in the last 8760 hours. HbA1C: No results for input(s): HGBA1C in the last 72 hours. CBG: No results for input(s): GLUCAP in the last 168 hours. Lipid Profile: No results for input(s): CHOL, HDL, LDLCALC, TRIG, CHOLHDL, LDLDIRECT in the last 72 hours. Thyroid Function Tests: Recent Labs    11/20/20 1123  TSH 2.620   Anemia Panel: No results for input(s): VITAMINB12, FOLATE, FERRITIN, TIBC, IRON, RETICCTPCT in the last 72 hours. Urine analysis:    Component Value Date/Time   COLORURINE YELLOW 11/13/2020 1916   APPEARANCEUR CLOUDY (A) 11/13/2020 1916   LABSPEC 1.030 11/13/2020 1916   PHURINE 5.0 11/13/2020 1916   GLUCOSEU NEGATIVE 11/13/2020 1916   HGBUR NEGATIVE 11/13/2020 1916   BILIRUBINUR NEGATIVE 11/13/2020 1916   KETONESUR 5 (A) 11/13/2020 1916   PROTEINUR 100 (A) 11/13/2020 1916   NITRITE NEGATIVE 11/13/2020 1916   LEUKOCYTESUR LARGE (A) 11/13/2020 1916    Radiological Exams on Admission: CT HEAD WO CONTRAST  Result Date: 11/21/2020 CLINICAL DATA:  87 year old male with progressive altered mental status, generalized weakness. This exam originally designated as [facility] to report, however, the [facility] Emergency Department contacted me by phone requesting urgent interpretation. EXAM: CT HEAD WITHOUT CONTRAST TECHNIQUE: Contiguous axial images were obtained from the base of the skull through the vertex without intravenous contrast. COMPARISON:  Head CT 01/14/2017. FINDINGS: Brain: Encephalomalacia at the left occipital pole is new since 2018 and associated with mild interval enlargement of the left occipital horn. Patchy bilateral cerebral white matter hypodensity has progressed since 2018, most notably the posterior limb right internal capsule  on series 2, image 19. No midline shift, ventriculomegaly, mass effect, evidence of mass lesion, intracranial hemorrhage or evidence of cortically based acute infarction. Vascular: Calcified atherosclerosis at the skull base. Evidence of a small unruptured 6-7 mm basilar tip aneurysm or focal vessel ectasia (coronal image 38), probably not significantly changed since 2018. No suspicious intracranial vascular hyperdensity. Skull: No acute osseous abnormality identified. Sinuses/Orbits: Chronic right maxillary  sinusitis with mucoperiosteal thickening and inspissated material. Other Visualized paranasal sinuses and mastoids are clear. Other: No acute orbit or scalp soft tissue finding identified. IMPRESSION: 1. No acute intracranial abnormality by CT. - small Left PCA infarct at the left occipital pole is new since 2018 but appears chronic. - Progressed cerebral white matter disease since 2018, most likely due to small vessel ischemia. 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 2. Chronic right maxillary sinusitis. The above was discussed by telephone on 11/21/2020 at 1019 hours with ED provider Georga Kaufmann. Electronically Signed   By: Genevie Ann M.D.   On: 11/21/2020 10:32   CT Angio Chest PE W and/or Wo Contrast  Result Date: 11/21/2020 CLINICAL DATA:  Chest and left flank pain. Hypoxia and altered mental status. EXAM: CT ANGIOGRAPHY CHEST CT ABDOMEN AND PELVIS WITH CONTRAST TECHNIQUE: Multidetector CT imaging of the chest was performed using the standard protocol during bolus administration of intravenous contrast. Multiplanar CT image reconstructions and MIPs were obtained to evaluate the vascular anatomy. Multidetector CT imaging of the abdomen and pelvis was performed using the standard protocol during bolus administration of intravenous contrast. CONTRAST:  154mL OMNIPAQUE IOHEXOL 350 MG/ML SOLN COMPARISON:  CT a chest, abdomen, and pelvis dated November 13, 2020.  FINDINGS: CTA CHEST FINDINGS Cardiovascular: Satisfactory opacification of the pulmonary arteries to the segmental level. No evidence of pulmonary embolism. Stable cardiomegaly status post TAVR. No pericardial effusion. Unchanged mild aneurysmal dilatation of the mid ascending thoracic aorta measuring up to 4.0 cm. Coronary, aortic arch, and branch vessel atherosclerotic vascular disease. Unchanged left chest wall pacemaker. Mediastinum/Nodes: No enlarged mediastinal, hilar, or axillary lymph nodes. Unchanged 2.0 cm partially calcified hypodense nodule in the right thyroid lobe This has been evaluated on previous imaging. The trachea and esophagus demonstrate no significant findings. Lungs/Pleura: Minimal dependent subsegmental atelectasis in both lower lobes. No focal consolidation, pleural effusion, or pneumothorax. No suspicious pulmonary nodule. Biapical pleuroparenchymal scarring, greater on the right. Musculoskeletal: No chest wall abnormality. No acute or significant osseous findings. Review of the MIP images confirms the above findings. CT ABDOMEN AND PELVIS FINDINGS Hepatobiliary: No focal liver abnormality. Unchanged gallstone. No gallbladder wall thickening or biliary dilatation. Probable adenomyomatosis at the gallbladder fundus. Pancreas: Unremarkable. No pancreatic ductal dilatation or surrounding inflammatory changes. Spleen: Normal in size without focal abnormality. Adrenals/Urinary Tract: Adrenal glands are unremarkable. Unchanged bilateral renal cysts. No renal calculi or hydronephrosis. The bladder is decompressed by Foley catheter. Stomach/Bowel: Stomach is within normal limits. Appendix appears normal. No evidence of bowel wall thickening, distention, or inflammatory changes. Redundant sigmoid colon. Vascular/Lymphatic: Aortic atherosclerosis. No enlarged abdominal or pelvic lymph nodes. Reproductive: Unchanged mild prostatomegaly. Other: No abdominal wall hernia or abnormality. No  abdominopelvic ascites. No pneumoperitoneum. Musculoskeletal: No acute or significant osseous findings. Review of the MIP images confirms the above findings. IMPRESSION: Chest: 1. No evidence of pulmonary embolism. No acute intrathoracic process. 2. Unchanged mild aneurysmal dilatation of the mid ascending thoracic aorta measuring up to 4.0 cm. Recommend annual imaging followup by CTA or MRA. This recommendation follows 2010 ACCF/AHA/AATS/ACR/ASA/SCA/SCAI/SIR/STS/SVM Guidelines for the Diagnosis and Management of Patients with Thoracic Aortic Disease. Circulation. 2010; 121: M196-Q229. Aortic aneurysm NOS (ICD10-I71.9) 3.  Aortic atherosclerosis (ICD10-I70.0). Abdomen and pelvis: 1. No acute intra-abdominal process. 2. Unchanged cholelithiasis. Electronically Signed   By: Titus Dubin M.D.   On: 11/21/2020 13:07   CT ABDOMEN PELVIS W CONTRAST  Result Date: 11/21/2020 CLINICAL DATA:  Chest  and left flank pain. Hypoxia and altered mental status. EXAM: CT ANGIOGRAPHY CHEST CT ABDOMEN AND PELVIS WITH CONTRAST TECHNIQUE: Multidetector CT imaging of the chest was performed using the standard protocol during bolus administration of intravenous contrast. Multiplanar CT image reconstructions and MIPs were obtained to evaluate the vascular anatomy. Multidetector CT imaging of the abdomen and pelvis was performed using the standard protocol during bolus administration of intravenous contrast. CONTRAST:  165mL OMNIPAQUE IOHEXOL 350 MG/ML SOLN COMPARISON:  CT a chest, abdomen, and pelvis dated November 13, 2020. FINDINGS: CTA CHEST FINDINGS Cardiovascular: Satisfactory opacification of the pulmonary arteries to the segmental level. No evidence of pulmonary embolism. Stable cardiomegaly status post TAVR. No pericardial effusion. Unchanged mild aneurysmal dilatation of the mid ascending thoracic aorta measuring up to 4.0 cm. Coronary, aortic arch, and branch vessel atherosclerotic vascular disease. Unchanged left chest wall  pacemaker. Mediastinum/Nodes: No enlarged mediastinal, hilar, or axillary lymph nodes. Unchanged 2.0 cm partially calcified hypodense nodule in the right thyroid lobe This has been evaluated on previous imaging. The trachea and esophagus demonstrate no significant findings. Lungs/Pleura: Minimal dependent subsegmental atelectasis in both lower lobes. No focal consolidation, pleural effusion, or pneumothorax. No suspicious pulmonary nodule. Biapical pleuroparenchymal scarring, greater on the right. Musculoskeletal: No chest wall abnormality. No acute or significant osseous findings. Review of the MIP images confirms the above findings. CT ABDOMEN AND PELVIS FINDINGS Hepatobiliary: No focal liver abnormality. Unchanged gallstone. No gallbladder wall thickening or biliary dilatation. Probable adenomyomatosis at the gallbladder fundus. Pancreas: Unremarkable. No pancreatic ductal dilatation or surrounding inflammatory changes. Spleen: Normal in size without focal abnormality. Adrenals/Urinary Tract: Adrenal glands are unremarkable. Unchanged bilateral renal cysts. No renal calculi or hydronephrosis. The bladder is decompressed by Foley catheter. Stomach/Bowel: Stomach is within normal limits. Appendix appears normal. No evidence of bowel wall thickening, distention, or inflammatory changes. Redundant sigmoid colon. Vascular/Lymphatic: Aortic atherosclerosis. No enlarged abdominal or pelvic lymph nodes. Reproductive: Unchanged mild prostatomegaly. Other: No abdominal wall hernia or abnormality. No abdominopelvic ascites. No pneumoperitoneum. Musculoskeletal: No acute or significant osseous findings. Review of the MIP images confirms the above findings. IMPRESSION: Chest: 1. No evidence of pulmonary embolism. No acute intrathoracic process. 2. Unchanged mild aneurysmal dilatation of the mid ascending thoracic aorta measuring up to 4.0 cm. Recommend annual imaging followup by CTA or MRA. This recommendation follows 2010  ACCF/AHA/AATS/ACR/ASA/SCA/SCAI/SIR/STS/SVM Guidelines for the Diagnosis and Management of Patients with Thoracic Aortic Disease. Circulation. 2010; 121: [id number]. Aortic aneurysm NOS (ICD10-I71.9) 3.  Aortic atherosclerosis (ICD10-I70.0). Abdomen and pelvis: 1. No acute intra-abdominal process. 2. Unchanged cholelithiasis. Electronically Signed   By: Titus Dubin M.D.   On: 11/21/2020 13:07   DG Chest Portable 1 View  Result Date: 11/21/2020 CLINICAL DATA:  87 year old male with history of altered mental status. Weakness. EXAM: PORTABLE CHEST 1 VIEW COMPARISON:  Chest x-ray 11/13/2020. FINDINGS: Lung volumes are normal. No consolidative airspace disease. No pleural effusions. No suspicious appearing pulmonary nodules or masses are noted. No pneumothorax. No evidence of pulmonary edema. Mild cardiomegaly. Upper mediastinal contours are within normal limits. Atherosclerotic calcifications in the thoracic aorta. Status post TAVR. Left-sided pacemaker device in place with lead tips projecting over the expected location of the right atrium and right ventricle. IMPRESSION: 1. No radiographic evidence of acute cardiopulmonary disease. 2. Mild cardiomegaly. 3. Aortic atherosclerosis. Electronically Signed   By: Vinnie Langton M.D.   On: 11/21/2020 10:54    Assessment/Plan Altered mental status Hx of dementia     - admit to inpatient, tele     -  general decline over last several weeks     - during interview, he is A&O x 1 (which is his baseline according to his son); he is able to follow commands slowly; his son confirms that this is a slight improvement over the last several days     - could be a progression of his previous dementia or a slow recovery from the insult of infection     - apparently his dementia meds were held recently by neurology  UTI Hematuria Urinary retention     - son states that micro in urology office was positive for morganella     - EDP spoke with urology; they stated it was  reasonable to switch to IV abx to help and re-culture urine     - per EDP, urology not planning acute intervention on hematuria     - continue rocephin, check urine culture     - maintain foley  BLE weakness/numbness Dysphagia?     - he has no focality on exam     - CTH negative for acute stroke on 2/17     - spoke with neurology Re: rpt CTH vs MRI; believes MRI would be helpful     - check MRI brain, t-spine, l-spine     - was able to eat applesauce at bedside without difficulty; will have SLP eval     - PT/OT  Afib     - hold eliquis d/t hematuria  Failure to thrive     - all of this could be a progression of his dementia     - palliative care consult  HTN     - continue home regimen  Elevated troponin     - trp flat, EKG w/ jxnal rhythm, denies CP; follow symptoms  Elevated CPK     - fluids, follow AM lab  DVT prophylaxis: SCDs  Code Status: DNR  Family Communication: with son at bedside  Consults called: Neurology   Status is: Inpatient  Remains inpatient appropriate because:Inpatient level of care appropriate due to severity of illness   Dispo: The patient is from: Home              Anticipated d/c is to: SNF              Anticipated d/c date is: 3 days              Patient currently is not medically stable to d/c.   Difficult to place patient No  Jonnie Finner DO [facility]  If 7PM-7AM, please contact night-coverage [email or web address]  11/21/2020, 2:59 PM

## 2020-11-21 NOTE — ED Provider Notes (Signed)
[facility] DEPT Provider Note   CSN: [id number] Arrival date & time: 11/21/20  6378     History Chief Complaint  Patient presents with  . Recurrent UTI  . Altered Mental Status    Jeffery Rogers is a 87 y.o. male.  The history is provided by a relative (Son).        Level V caveat due to dementia and altered mental status.    Jeffery Rogers is a 87 y.o. male, with a history of pacemaker due to complete heart block, hypercholesterolemia, A. fib on Eliquis, Alzheimer's dementia, presenting to the ED with generalized weakness and declining mental status.  Decline was first noted 3 weeks ago, much worse over the last several days.  Patient is DNR.     Past Medical History:  Diagnosis Date  . Arthritis    "right arm/shoulder" (02/10/2016)  . Complete heart block ([place]) 02/10/2016   a. s/p Emergency planning/management officer  . Goiter   . Hypercholesteremia   . Memory loss   . Nasal septal deviation   . Persistent atrial fibrillation ([place])   . Presence of permanent cardiac pacemaker   . 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 Severe aortic stenosis   . Tremor     Patient Active Problem List   Diagnosis Date Noted  . AMS (altered mental status) 11/21/2020  . Confusion 11/20/2020  . Late onset Alzheimer's disease with behavioral disturbance ([place]) 11/19/2019  . Dementia with behavioral disturbance ([place])   . Fall 03/07/2019  . Noise effect on both inner ears 10/06/2018  . Memory loss   . Presence of permanent cardiac pacemaker   . Persistent atrial fibrillation ([place])   . S/P TAVR (transcatheter aortic valve replacement) 05/30/2018  . Severe aortic stenosis   . Tendinopathy of left rotator cuff 12/19/2017  . Bilateral impacted cerumen 11/25/2017  . Presbycusis of both ears 11/25/2017  . Vasomotor rhinitis 11/25/2017  .  Dizziness 05/15/2017  . Chronic right maxillary sinusitis 03/30/2017  . Rhinitis 03/30/2017  . Elevated CPK 02/18/2017  . Sinusitis 02/18/2017  . Peripheral neuropathy 02/18/2017  . Chronic maxillary sinusitis 02/18/2017  . Mild cognitive impairment 01/12/2017  . Essential tremor 01/12/2017  . Orthostatic dizziness 01/12/2017  . Diplopia 01/12/2017  . First degree atrioventricular block 03/29/2013  . Hypercholesteremia   . Goiter   . Nasal septal deviation   . Snoring     Past Surgical History:  Procedure Laterality Date  . CATARACT EXTRACTION W/ INTRAOCULAR LENS  IMPLANT, BILATERAL Bilateral ~ 2000  . EP IMPLANTABLE DEVICE N/A 02/11/2016   Procedure: Pacemaker Implant;  Surgeon: Herendeen Lance, MD;  Location: [facility];  Service: Cardiovascular;  Laterality: N/A;  . RIGHT/LEFT HEART CATH AND CORONARY ANGIOGRAPHY N/A 05/03/2018   Procedure: RIGHT/LEFT HEART CATH AND CORONARY ANGIOGRAPHY;  Surgeon: Burnell Blanks, MD;  Location: [facility];  Service: Cardiovascular;  Laterality: N/A;  . TEE WITHOUT CARDIOVERSION N/A 05/30/2018   Procedure: TRANSESOPHAGEAL ECHOCARDIOGRAM (TEE);  Surgeon: Burnell Blanks, MD;  Location: [facility];  Service: Open Heart Surgery;  Laterality: N/A;  . TONSILLECTOMY  1930s  . TRANSCATHETER AORTIC VALVE REPLACEMENT, TRANSFEMORAL N/A 05/30/2018   Procedure: TRANSCATHETER AORTIC VALVE REPLACEMENT, TRANSFEMORAL using a 78mm Edwards Sapien 3 Aortic Valve;  Surgeon: Burnell Blanks, MD;  Location: [facility];  Service: Open Heart Surgery;  Laterality: N/A;  . WISDOM TOOTH EXTRACTION  1990s       Family History  Problem Relation Age of Onset  . Stroke Other   . Irregular heart beat Other   . Stroke Mother   . Stroke Father   . CAD Father        MI    Social History   Tobacco Use  . Smoking status: Former Smoker    Packs/day: 1.00    Years: 16.00    Pack years: 16.00    Types: Cigarettes  . Smokeless tobacco: Never  Used  . Tobacco comment: Quit smoking cigarettes in 1971  Vaping Use  . Vaping Use: Never used  Substance Use Topics  . Alcohol use: Never    Alcohol/week: 2.0 standard drinks    Types: 1 Cans of beer, 1 Shots of liquor per week  . Drug use: No    Home Medications Prior to Admission medications   Medication Sig Start Date End Date Taking? Authorizing Provider  acetaminophen (TYLENOL) 500 MG tablet Take 500-1,000 mg by mouth every 6 (six) hours as needed for mild pain, fever or headache.   Yes [provider]  ALPRAZolam (XANAX) 0.25 MG tablet Take 0.25 mg by mouth 3 (three) times daily as needed for anxiety.   Yes [provider]  amLODipine (NORVASC) 5 MG tablet Take 1 tablet by mouth daily. Patient taking differently: Take 2.5 mg by mouth daily. 04/15/20  Yes Eileen Stanford, PA-C  apixaban (ELIQUIS) 5 MG TABS tablet Take 1 tablet (5 mg total) by mouth 2 (two) times daily. 02/08/17  Yes Mach Lance, MD  ipratropium (ATROVENT) 0.03 % nasal spray Place 2 sprays into both nostrils 4 (four) times daily as needed for rhinitis.   Yes [provider]  meloxicam (MOBIC) 7.5 MG tablet Take 7.5 mg by mouth 2 (two) times daily. 11/14/20  Yes [provider]  mirtazapine (REMERON) 7.5 MG tablet Take 7.5 mg by mouth daily. 02/19/20  Yes [provider]  oxyCODONE-acetaminophen (PERCOCET) 5-325 MG tablet Take 1-2 tablets by mouth every 6 (six) hours as needed. Patient taking differently: Take 1-2 tablets by mouth every 6 (six) hours as needed for moderate pain. 11/13/20  Yes Charlesetta Shanks, MD  polyvinyl alcohol (LIQUIFILM TEARS) 1.4 % ophthalmic solution Place 1 drop into both eyes as needed for dry eyes.   Yes [provider]  QUEtiapine (SEROQUEL) 50 MG tablet Take 2 tablets (100 mg total) by mouth at bedtime. 09/03/20  Yes Marcial Pacas, MD  sulfamethoxazole-trimethoprim (BACTRIM DS) 800-160 MG tablet Take 1 tablet by mouth 2 (two) times daily.  11/13/20  Yes [provider]  ARIPiprazole (ABILIFY) 10 MG tablet Take 1 tablet (10 mg total) by mouth daily. Patient not taking: Reported on 11/21/2020 09/03/20   Marcial Pacas, MD  atorvastatin (LIPITOR) 40 MG tablet Take 1 tablet (40 mg total) by mouth daily. Patient not taking: No sig reported 05/24/17   Angelica Lance, MD  donepezil (ARICEPT) 10 MG tablet Take 1 tablet by mouth at bedtime. Patient not taking: Reported on 11/21/2020 03/17/20   Marcial Pacas, MD  L-Methylfolate-Algae-B12-B6 [facility]) 3-90.314-2-35 MG CAPS Take 1 capsule by mouth 2 (two) times daily. Patient not taking: Reported on 11/21/2020 03/25/20   Marcial Pacas, MD  memantine (NAMENDA) 10 MG tablet Take 1 tablet by mouth twice daily. Patient not taking: No sig reported 03/17/20   Marcial Pacas, MD    Allergies    Patient has no  known allergies.  Review of Systems   Review of Systems  Unable to perform ROS: Mental status change    Physical Exam Updated Vital Signs BP 134/72   Pulse 60   Temp 98.5 F (36.9 C) (Rectal)   Resp 12   SpO2 100%   Physical Exam Vitals and nursing note reviewed.  Constitutional:      Appearance: He is well-developed. He is ill-appearing. He is not diaphoretic.  HENT:     Head: Normocephalic and atraumatic.     Mouth/Throat:     Mouth: Mucous membranes are moist.     Pharynx: Oropharynx is clear.  Eyes:     Conjunctiva/sclera: Conjunctivae normal.  Cardiovascular:     Rate and Rhythm: Normal rate and regular rhythm.     Pulses: Normal pulses.          Radial pulses are 2+ on the right side and 2+ on the left side.       Dorsalis pedis pulses are 2+ on the right side and 2+ on the left side.     Heart sounds: Normal heart sounds.     Comments: Tactile temperature in the extremities appropriate and equal bilaterally. Pulmonary:     Effort: Pulmonary effort is normal. No respiratory distress.     Breath sounds: Normal breath sounds.  Chest:     Chest wall: Tenderness present. No  deformity, swelling or crepitus.  Abdominal:     Palpations: Abdomen is soft.     Tenderness: There is abdominal tenderness. There is no guarding.    Genitourinary:    Comments: Foul smell in the area of the Foley catheter and penis. Dark-colored, possibly bloody, fluid in the Foley collection bag. Single sore/ulcer to the foreskin without noted purulence or surrounding erythema. Musculoskeletal:     Cervical back: Neck supple.     Right lower leg: No edema.     Left lower leg: No edema.  Lymphadenopathy:     Cervical: No cervical adenopathy.  Skin:    General: Skin is warm and dry.  Neurological:     Comments: Will follow some commands, but overall weak. Will not keep eyes open.  Shortly into his ED course, patient had eyes open spontaneously.  He would speak and answer some questions.  He will follow simple commands, still overall weak.  Psychiatric:        Mood and Affect: Mood and affect normal.        Speech: Speech normal.        Behavior: Behavior normal.     ED Results / Procedures / Treatments   Labs (all labs ordered are listed, but only abnormal results are displayed) Labs Reviewed  COMPREHENSIVE METABOLIC PANEL - Abnormal; Notable for the following components:      Result Value   Sodium 134 (*)    BUN 24 (*)    AST 60 (*)    ALT 48 (*)    GFR, Estimated 57 (*)    All other components within normal limits  CK - Abnormal; Notable for the following components:   Total CK 461 (*)    All other components within normal limits  TROPONIN I (HIGH SENSITIVITY) - Abnormal; Notable for the following components:   Troponin I (High Sensitivity) 41 (*)    All other components within normal limits  TROPONIN I (HIGH SENSITIVITY) - Abnormal; Notable for the following components:   Troponin I (High Sensitivity) 40 (*)    All other components within normal  limits  RESP PANEL BY RT-PCR (FLU A&B, COVID) [id number]  URINE CULTURE  C DIFFICILE QUICK SCREEN W PCR REFLEX   CULTURE, BLOOD (ROUTINE X 2)  CULTURE, BLOOD (ROUTINE X 2)  CBC WITH DIFFERENTIAL/PLATELET  HIV ANTIBODY (ROUTINE TESTING W REFLEX)  LACTIC ACID, PLASMA  RAPID URINE DRUG SCREEN, HOSP PERFORMED  URINALYSIS, ROUTINE W REFLEX MICROSCOPIC  RPR    EKG EKG Interpretation  Date/Time:  Friday November 21 2020 10:24:31 EST Ventricular Rate:  60 PR Interval:    QRS Duration: 174 QT Interval:  481 QTC Calculation: 481 R Axis:   -82 Text Interpretation: Junctional rhythm Nonspecific IVCD with LAD Left ventricular hypertrophy Inferior infarct, acute (RCA) Anterolateral infarct, old Probable RV involvement, suggest recording right precordial leads No significant change since last tracing Confirmed by Lacretia Leigh ([id number]) on 11/21/2020 11:42:58 AM   Radiology CT HEAD WO CONTRAST  Result Date: 11/21/2020 CLINICAL DATA:  87 year old male with progressive altered mental status, generalized weakness. This exam originally designated as [facility] to report, however, the [facility] Emergency Department contacted me by phone requesting urgent interpretation. EXAM: CT HEAD WITHOUT CONTRAST TECHNIQUE: Contiguous axial images were obtained from the base of the skull through the vertex without intravenous contrast. COMPARISON:  Head CT 01/14/2017. FINDINGS: Brain: Encephalomalacia at the left occipital pole is new since 2018 and associated with mild interval enlargement of the left occipital horn. Patchy bilateral cerebral white matter hypodensity has progressed since 2018, most notably the posterior limb right internal capsule on series 2, image 19. No midline shift, ventriculomegaly, mass effect, evidence of mass lesion, intracranial hemorrhage or evidence of cortically based acute infarction. Vascular: Calcified atherosclerosis at the skull base. Evidence of a small unruptured 6-7 mm basilar tip aneurysm or focal vessel ectasia (coronal image 38), probably not significantly changed since  2018. No suspicious intracranial vascular hyperdensity. Skull: No acute osseous abnormality identified. Sinuses/Orbits: Chronic right maxillary sinusitis with mucoperiosteal thickening and inspissated material. Other Visualized paranasal sinuses and mastoids are clear. Other: No acute orbit or scalp soft tissue finding identified. IMPRESSION: 1. No acute intracranial abnormality by CT. - small Left PCA infarct at the left occipital pole is new since 2018 but appears chronic. - Progressed cerebral white matter disease since 2018, most likely due to small vessel ischemia. 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 2. Chronic right maxillary sinusitis. The above was discussed by telephone on 11/21/2020 at 1019 hours with ED provider Georga Kaufmann. Electronically Signed   By: Genevie Ann M.D.   On: 11/21/2020 10:32   CT Angio Chest PE W and/or Wo Contrast  Result Date: 11/21/2020 CLINICAL DATA:  Chest and left flank pain. Hypoxia and altered mental status. EXAM: CT ANGIOGRAPHY CHEST CT ABDOMEN AND PELVIS WITH CONTRAST TECHNIQUE: Multidetector CT imaging of the chest was performed using the standard protocol during bolus administration of intravenous contrast. Multiplanar CT image reconstructions and MIPs were obtained to evaluate the vascular anatomy. Multidetector CT imaging of the abdomen and pelvis was performed using the standard protocol during bolus administration of intravenous contrast. CONTRAST:  165mL OMNIPAQUE IOHEXOL 350 MG/ML SOLN COMPARISON:  CT a chest, abdomen, and pelvis dated November 13, 2020. FINDINGS: CTA CHEST FINDINGS Cardiovascular: Satisfactory opacification of the pulmonary arteries to the segmental level. No evidence of pulmonary embolism. Stable cardiomegaly status post TAVR. No pericardial effusion. Unchanged mild aneurysmal dilatation of the mid ascending thoracic aorta measuring up to 4.0 cm. Coronary, aortic arch, and  branch vessel atherosclerotic  vascular disease. Unchanged left chest wall pacemaker. Mediastinum/Nodes: No enlarged mediastinal, hilar, or axillary lymph nodes. Unchanged 2.0 cm partially calcified hypodense nodule in the right thyroid lobe This has been evaluated on previous imaging. The trachea and esophagus demonstrate no significant findings. Lungs/Pleura: Minimal dependent subsegmental atelectasis in both lower lobes. No focal consolidation, pleural effusion, or pneumothorax. No suspicious pulmonary nodule. Biapical pleuroparenchymal scarring, greater on the right. Musculoskeletal: No chest wall abnormality. No acute or significant osseous findings. Review of the MIP images confirms the above findings. CT ABDOMEN AND PELVIS FINDINGS Hepatobiliary: No focal liver abnormality. Unchanged gallstone. No gallbladder wall thickening or biliary dilatation. Probable adenomyomatosis at the gallbladder fundus. Pancreas: Unremarkable. No pancreatic ductal dilatation or surrounding inflammatory changes. Spleen: Normal in size without focal abnormality. Adrenals/Urinary Tract: Adrenal glands are unremarkable. Unchanged bilateral renal cysts. No renal calculi or hydronephrosis. The bladder is decompressed by Foley catheter. Stomach/Bowel: Stomach is within normal limits. Appendix appears normal. No evidence of bowel wall thickening, distention, or inflammatory changes. Redundant sigmoid colon. Vascular/Lymphatic: Aortic atherosclerosis. No enlarged abdominal or pelvic lymph nodes. Reproductive: Unchanged mild prostatomegaly. Other: No abdominal wall hernia or abnormality. No abdominopelvic ascites. No pneumoperitoneum. Musculoskeletal: No acute or significant osseous findings. Review of the MIP images confirms the above findings. IMPRESSION: Chest: 1. No evidence of pulmonary embolism. No acute intrathoracic process. 2. Unchanged mild aneurysmal dilatation of the mid ascending thoracic aorta measuring up to 4.0 cm. Recommend annual imaging followup by CTA  or MRA. This recommendation follows 2010 ACCF/AHA/AATS/ACR/ASA/SCA/SCAI/SIR/STS/SVM Guidelines for the Diagnosis and Management of Patients with Thoracic Aortic Disease. Circulation. 2010; 121: W967-R916. Aortic aneurysm NOS (ICD10-I71.9) 3.  Aortic atherosclerosis (ICD10-I70.0). Abdomen and pelvis: 1. No acute intra-abdominal process. 2. Unchanged cholelithiasis. Electronically Signed   By: Titus Dubin M.D.   On: 11/21/2020 13:07   CT ABDOMEN PELVIS W CONTRAST  Result Date: 11/21/2020 CLINICAL DATA:  Chest and left flank pain. Hypoxia and altered mental status. EXAM: CT ANGIOGRAPHY CHEST CT ABDOMEN AND PELVIS WITH CONTRAST TECHNIQUE: Multidetector CT imaging of the chest was performed using the standard protocol during bolus administration of intravenous contrast. Multiplanar CT image reconstructions and MIPs were obtained to evaluate the vascular anatomy. Multidetector CT imaging of the abdomen and pelvis was performed using the standard protocol during bolus administration of intravenous contrast. CONTRAST:  137mL OMNIPAQUE IOHEXOL 350 MG/ML SOLN COMPARISON:  CT a chest, abdomen, and pelvis dated November 13, 2020. FINDINGS: CTA CHEST FINDINGS Cardiovascular: Satisfactory opacification of the pulmonary arteries to the segmental level. No evidence of pulmonary embolism. Stable cardiomegaly status post TAVR. No pericardial effusion. Unchanged mild aneurysmal dilatation of the mid ascending thoracic aorta measuring up to 4.0 cm. Coronary, aortic arch, and branch vessel atherosclerotic vascular disease. Unchanged left chest wall pacemaker. Mediastinum/Nodes: No enlarged mediastinal, hilar, or axillary lymph nodes. Unchanged 2.0 cm partially calcified hypodense nodule in the right thyroid lobe This has been evaluated on previous imaging. The trachea and esophagus demonstrate no significant findings. Lungs/Pleura: Minimal dependent subsegmental atelectasis in both lower lobes. No focal consolidation, pleural  effusion, or pneumothorax. No suspicious pulmonary nodule. Biapical pleuroparenchymal scarring, greater on the right. Musculoskeletal: No chest wall abnormality. No acute or significant osseous findings. Review of the MIP images confirms the above findings. CT ABDOMEN AND PELVIS FINDINGS Hepatobiliary: No focal liver abnormality. Unchanged gallstone. No gallbladder wall thickening or biliary dilatation. Probable adenomyomatosis at the gallbladder fundus. Pancreas: Unremarkable. No pancreatic ductal dilatation or surrounding inflammatory changes. Spleen: Normal in size  without focal abnormality. Adrenals/Urinary Tract: Adrenal glands are unremarkable. Unchanged bilateral renal cysts. No renal calculi or hydronephrosis. The bladder is decompressed by Foley catheter. Stomach/Bowel: Stomach is within normal limits. Appendix appears normal. No evidence of bowel wall thickening, distention, or inflammatory changes. Redundant sigmoid colon. Vascular/Lymphatic: Aortic atherosclerosis. No enlarged abdominal or pelvic lymph nodes. Reproductive: Unchanged mild prostatomegaly. Other: No abdominal wall hernia or abnormality. No abdominopelvic ascites. No pneumoperitoneum. Musculoskeletal: No acute or significant osseous findings. Review of the MIP images confirms the above findings. IMPRESSION: Chest: 1. No evidence of pulmonary embolism. No acute intrathoracic process. 2. Unchanged mild aneurysmal dilatation of the mid ascending thoracic aorta measuring up to 4.0 cm. Recommend annual imaging followup by CTA or MRA. This recommendation follows 2010 ACCF/AHA/AATS/ACR/ASA/SCA/SCAI/SIR/STS/SVM Guidelines for the Diagnosis and Management of Patients with Thoracic Aortic Disease. Circulation. 2010; 121: S505-L976. Aortic aneurysm NOS (ICD10-I71.9) 3.  Aortic atherosclerosis (ICD10-I70.0). Abdomen and pelvis: 1. No acute intra-abdominal process. 2. Unchanged cholelithiasis. Electronically Signed   By: Titus Dubin M.D.   On:  11/21/2020 13:07   DG Chest Portable 1 View  Result Date: 11/21/2020 CLINICAL DATA:  87 year old male with history of altered mental status. Weakness. EXAM: PORTABLE CHEST 1 VIEW COMPARISON:  Chest x-ray 11/13/2020. FINDINGS: Lung volumes are normal. No consolidative airspace disease. No pleural effusions. No suspicious appearing pulmonary nodules or masses are noted. No pneumothorax. No evidence of pulmonary edema. Mild cardiomegaly. Upper mediastinal contours are within normal limits. Atherosclerotic calcifications in the thoracic aorta. Status post TAVR. Left-sided pacemaker device in place with lead tips projecting over the expected location of the right atrium and right ventricle. IMPRESSION: 1. No radiographic evidence of acute cardiopulmonary disease. 2. Mild cardiomegaly. 3. Aortic atherosclerosis. Electronically Signed   By: Vinnie Langton M.D.   On: 11/21/2020 10:54    Procedures .Critical Care Performed by: Lorayne Bender, PA-C Authorized by: Lorayne Bender, PA-C   Critical care provider statement:    Critical care time (minutes):  35   Critical care time was exclusive of:  Separately billable procedures and treating other patients   Critical care was necessary to treat or prevent imminent or life-threatening deterioration of the following conditions:  Respiratory failure   Critical care was time spent personally by me on the following activities:  Ordering and performing treatments and interventions, ordering and review of laboratory studies, pulse oximetry, re-evaluation of patient's condition, ordering and review of radiographic studies, review of old charts, obtaining history from patient or surrogate, development of treatment plan with patient or surrogate, discussions with consultants, evaluation of patient's response to treatment and examination of patient   I assumed direction of critical care for this patient from another provider in my specialty: no     Care discussed with:  admitting provider       Medications Ordered in ED Medications  lactated ringers bolus 250 mL (has no administration in time range)  fentaNYL (SUBLIMAZE) injection 50 mcg (50 mcg Intravenous Given 11/21/20 1123)  iohexol (OMNIPAQUE) 350 MG/ML injection 100 mL (100 mLs Intravenous Contrast Given 11/21/20 1227)  cefTRIAXone (ROCEPHIN) 1 g in sodium chloride 0.9 % 100 mL IVPB (1 g Intravenous New Bag/Given 11/21/20 1442)  ALPRAZolam (XANAX) tablet 0.25 mg (0.25 mg Oral Given 11/21/20 1443)    ED Course  I have reviewed the triage vital signs and the nursing notes.  Pertinent labs & imaging results that were available during my care of the patient were reviewed by me and considered in my medical  decision making (see chart for details).  Clinical Course as of 11/21/20 1606  Fri Nov 21, 2020  1014 Temp: 98.5 F (36.9 C) Rectal temperature [SJ]  40 Spoke with Dr. Nevada Crane, radiologist, regarding patient's head CT performed yesterday as there was no read available in the system.  Dr. Nevada Crane did a preliminary review of the scan while we were on the phone and states since his last noted CT of the head in 2018 patient has progressive small vessel disease globally.  Evidence of left occipital lobe infarct, does not appear to be acute.  He will review the scan in more detail. Recommends MRI for further evaluation. [SJ]  21 Spoke with patient's son, Jeffery Rogers, Jeffery Rogers. patient lives at home with 24-hour nursing care. Three weeks ago was conversational and able to walk a mile, steady decline since then. Foley placed middle of January due to urinary retention. Diagnosed with UTI on ED visit February 10, treated with Rocephin and has been on Bactrim since then. Diarrhea last few days.  Difficult to get the patient to eat or drink. Blood in foley bag especially since yesterday.  Dark colored urine for several days before that. Brought to urology today, they called EMS. [SJ]  1045 Patient awake. Follows  commands. Generally weak. Complaining of pain to left flank. [SJ]  0867 Spoke with Elmo Putt, palliative care. States someone from their team will come see the patient. [SJ]  1400 MRI tech states they can not perform MRI on patients with pacemakers here at [facility], per radiology policy.  If MRI is desired, patient would need to be transferred to [facility] [SJ]  [place] with Dr. Marylyn Ishihara, hospitalist. He will admit the patient. Requests we make contact with Urology and Neurology for their recommendations on the patient. [SJ]  6195 Had on call neurologist paged through [facility] Neuro answering service. [SJ]  [place] with Dr. Jaynee Eagles, [facility]. States they can see the patient on Monday in follow up, if necessary, otherwise coordinate care through inpatient neurology. [SJ]  67 Spoke with Dr. Charlsie Merles, neurologist.  States he will review the patient's chart and information and call back. Requests I confirm patient's pacemaker is MRI compatible.  [SJ]  [place] with Tori, MRI tech at [employer].  States patient's pacemaker ([employer] MRI Pacemaker and Ingevity pacing leads) is MRI compatible. However, they need a cardiology PA to put the pacemaker into MRI-safe mode, they leave at 3pm, and they don't do pacemaker-involved MRIs on the weekends. [SJ]  1500 Spoke with Dr. Louis Meckel, urologist. States he does not see a note for the patient's visit to the office today. He will look into it and call me back. [SJ]  [place] with Gilmer Mor, RN at [facility] urology.  UTI panel from Feb 9 showed E. Coli, staph, and Morganella. Foley was also replaced that day.  They did not have a plan in place to further address the patient's hematuria at this time. Patient may benefit from change of Foley today, clean sample of urine following Foley change, and placing the patient on IV antibiotics for UTI. [SJ]  [place], MRI tech at [facility], says she is working on having the pacemaker rep come in. [SJ]  19 Spoke with  Dr. Charlsie Merles, neurologist. States it sounds like patient's presentation could be from a variety of things, including progressing dementia. Difficulty swallowing and aspiration could also be due to progressing dementia. He states it would probably would be helpful to obtain an MRI on the patient,  though this does not need to be done emergently. However, if the opportunity presents itself, we should move forward with obtaining MRI. [SJ]  [place] from MRI at [facility] states the pacemaker rep can come in to work the patient's pacemaker to make it safe for MRI. They will have a nurse to monitor the patient until 7 PM this evening. Patient can be sent over at this time. [SJ]  6333 All of the information obtained was passed along to Dr. Marylyn Ishihara, hospitalist. He states he will coordinate transfer and imaging. [SJ]    Clinical Course User Index [SJ] Rosslyn Pasion, Helane Gunther, PA-C   MDM Rules/Calculators/A&P                           Patient presents with hypoxia down to 89%, increasing, generalized weakness, worsening mental status, inability to tolerate oral intake due to difficulty swallowing. Patient is indeed generally weak without noted focal deficits. No fever, hypotension, tachycardia (although patient does have a nondemand pacemaker). It appears as though patient began treatment for UTI February 10, however, there was no urine culture obtained at that time.  Patient was seen by neurology yesterday in the office.  Notes from this visit were reviewed.  Labs were drawn, head CT performed.    I personally reviewed and interpreted the patient's labs and imaging studies. It does not appear as though he has acute abnormalities on his available lab work, head CT, or chest x-ray. We added on IV antibiotics for possible failure of outpatient therapy of patient's UTI. He did not produce any stool while under my care to run C. difficile analysis. Troponin stable and consistent with values noted on previous visit. It was  a delayed process to get the patient's Foley changed out due to other patients the RN was managing and then making sure the patient had appropriate anxiolysis for the procedure. UA pending at time of admission.    Findings and plan of care discussed with attending physician, Lacretia Leigh, MD. Dr. Zenia Resides personally evaluated and examined this patient.  Vitals:   11/21/20 1215 11/21/20 1300 11/21/20 1359 11/21/20 1449  BP: (!) 127/59 (!) 159/57 134/70 (!) 143/57  Pulse: 60 60 60 60  Resp: 11 18 15 15   Temp:      TempSrc:      SpO2: 100% 100% 100% 100%     Final Clinical Impression(s) / ED Diagnoses Final diagnoses:  Generalized weakness  Altered mental status, unspecified altered mental status type  Confusion    Rx / DC Orders ED Discharge Orders    None       Layla Maw 11/21/20 1610    Lacretia Leigh, MD 11/25/20 1003

## 2020-11-21 NOTE — ED Notes (Signed)
Medication given in applesauce. Patient ate 100% of applesauce.

## 2020-11-22 ENCOUNTER — Other Ambulatory Visit: Payer: Self-pay

## 2020-11-22 ENCOUNTER — Ambulatory Visit (HOSPITAL_COMMUNITY): Payer: Medicare Other

## 2020-11-22 ENCOUNTER — Ambulatory Visit (HOSPITAL_COMMUNITY): Admit: 2020-11-22 | Payer: Medicare Other

## 2020-11-22 DIAGNOSIS — R319 Hematuria, unspecified: Secondary | ICD-10-CM

## 2020-11-22 DIAGNOSIS — G301 Alzheimer's disease with late onset: Secondary | ICD-10-CM

## 2020-11-22 DIAGNOSIS — F0281 Dementia in other diseases classified elsewhere with behavioral disturbance: Secondary | ICD-10-CM

## 2020-11-22 DIAGNOSIS — F0391 Unspecified dementia with behavioral disturbance: Secondary | ICD-10-CM | POA: Diagnosis not present

## 2020-11-22 DIAGNOSIS — Z978 Presence of other specified devices: Secondary | ICD-10-CM

## 2020-11-22 DIAGNOSIS — Z7189 Other specified counseling: Secondary | ICD-10-CM | POA: Diagnosis not present

## 2020-11-22 DIAGNOSIS — R531 Weakness: Secondary | ICD-10-CM | POA: Diagnosis not present

## 2020-11-22 DIAGNOSIS — R410 Disorientation, unspecified: Secondary | ICD-10-CM | POA: Diagnosis not present

## 2020-11-22 DIAGNOSIS — N390 Urinary tract infection, site not specified: Secondary | ICD-10-CM

## 2020-11-22 DIAGNOSIS — Z515 Encounter for palliative care: Secondary | ICD-10-CM | POA: Diagnosis not present

## 2020-11-22 LAB — COMPREHENSIVE METABOLIC PANEL
ALT: 40 U/L (ref 0–44)
AST: 55 U/L — ABNORMAL HIGH (ref 15–41)
Albumin: 3.4 g/dL — ABNORMAL LOW (ref 3.5–5.0)
Alkaline Phosphatase: 83 U/L (ref 38–126)
Anion gap: 9 (ref 5–15)
BUN: 19 mg/dL (ref 8–23)
CO2: 24 mmol/L (ref 22–32)
Calcium: 8.8 mg/dL — ABNORMAL LOW (ref 8.9–10.3)
Chloride: 103 mmol/L (ref 98–111)
Creatinine, Ser: 1.06 mg/dL (ref 0.61–1.24)
GFR, Estimated: >60 mL/min (ref >60)
Glucose, Bld: 84 mg/dL (ref 70–99)
Potassium: 4.5 mmol/L (ref 3.5–5.1)
Sodium: 136 mmol/L (ref 135–145)
Total Bilirubin: 0.9 mg/dL (ref 0.3–1.2)
Total Protein: 6.2 g/dL — ABNORMAL LOW (ref 6.5–8.1)

## 2020-11-22 LAB — CBC WITH DIFFERENTIAL/PLATELET
Abs Immature Granulocytes: 0.03 10*3/uL (ref 0.00–0.07)
Basophils Absolute: 0.1 10*3/uL (ref 0.0–0.1)
Basophils Relative: 1 %
Eosinophils Absolute: 0.2 10*3/uL (ref 0.0–0.5)
Eosinophils Relative: 2 %
HCT: 40.7 % (ref 39.0–52.0)
Hemoglobin: 13.3 g/dL (ref 13.0–17.0)
Immature Granulocytes: 0 %
Lymphocytes Relative: 16 %
Lymphs Abs: 1.2 10*3/uL (ref 0.7–4.0)
MCH: 31.1 pg (ref 26.0–34.0)
MCHC: 32.7 g/dL (ref 30.0–36.0)
MCV: 95.3 fL (ref 80.0–100.0)
Monocytes Absolute: 0.6 10*3/uL (ref 0.1–1.0)
Monocytes Relative: 8 %
Neutro Abs: 5.6 10*3/uL (ref 1.7–7.7)
Neutrophils Relative %: 73 %
Platelets: 140 10*3/uL — ABNORMAL LOW (ref 150–400)
RBC: 4.27 MIL/uL (ref 4.22–5.81)
RDW: 13.7 % (ref 11.5–15.5)
WBC: 7.7 10*3/uL (ref 4.0–10.5)
nRBC: 0 % (ref 0.0–0.2)

## 2020-11-22 LAB — RAPID URINE DRUG SCREEN, HOSP PERFORMED
Amphetamines: NOT DETECTED
Barbiturates: NOT DETECTED
Benzodiazepines: POSITIVE — AB
Cocaine: NOT DETECTED
Opiates: NOT DETECTED
Tetrahydrocannabinol: NOT DETECTED

## 2020-11-22 LAB — URINALYSIS, ROUTINE W REFLEX MICROSCOPIC
Bacteria, UA: NONE SEEN
Bilirubin Urine: NEGATIVE
Glucose, UA: 50 mg/dL — AB
Ketones, ur: 5 mg/dL — AB
Leukocytes,Ua: NEGATIVE
Nitrite: NEGATIVE
Protein, ur: 100 mg/dL — AB
RBC / HPF: >50 RBC/hpf — ABNORMAL HIGH (ref 0–5)
Specific Gravity, Urine: 1.024 (ref 1.005–1.030)
pH: 5 (ref 5.0–8.0)

## 2020-11-22 LAB — C DIFFICILE QUICK SCREEN W PCR REFLEX
C Diff antigen: NEGATIVE
C Diff interpretation: NOT DETECTED
C Diff toxin: NEGATIVE

## 2020-11-22 LAB — VITAMIN B12: Vitamin B-12: 2359 pg/mL — ABNORMAL HIGH (ref 180–914)

## 2020-11-22 LAB — SEDIMENTATION RATE: Sed Rate: 0 mm/hr (ref 0–16)

## 2020-11-22 LAB — RPR: RPR Ser Ql: NONREACTIVE

## 2020-11-22 LAB — FOLATE: Folate: 57.8 ng/mL (ref >5.9)

## 2020-11-22 LAB — LIPID PANEL
Cholesterol: 127 mg/dL (ref 0–200)
HDL: 52 mg/dL (ref >40)
LDL Cholesterol: 65 mg/dL (ref 0–99)
Total CHOL/HDL Ratio: 2.4 RATIO
Triglycerides: 50 mg/dL (ref <150)
VLDL: 10 mg/dL (ref 0–40)

## 2020-11-22 LAB — CK: Total CK: 546 U/L — ABNORMAL HIGH (ref 49–397)

## 2020-11-22 LAB — C-REACTIVE PROTEIN: CRP: 0.6 mg/dL (ref <1.0)

## 2020-11-22 LAB — HEMOGLOBIN A1C
Hgb A1c MFr Bld: 5.8 % — ABNORMAL HIGH (ref 4.8–5.6)
Mean Plasma Glucose: 119.76 mg/dL

## 2020-11-22 LAB — TSH: TSH: 2.277 u[IU]/mL (ref 0.350–4.500)

## 2020-11-22 MED ORDER — APIXABAN 5 MG PO TABS: 5.0000 mg | ORAL_TABLET | Freq: Two times a day (BID) | ORAL | Status: DC

## 2020-11-22 MED ORDER — ALPRAZOLAM 0.25 MG PO TABS
0.2500 mg | ORAL_TABLET | Freq: Two times a day (BID) | ORAL | Status: DC
  Administered 2020-11-22 – 2020-11-25 (×7): 0.25 mg via ORAL
  Filled 2020-11-22 (×7): qty 1

## 2020-11-22 MED ORDER — MORPHINE SULFATE (PF) 2 MG/ML IV SOLN
1.0000 mg | INTRAVENOUS | Status: DC | PRN
  Administered 2020-11-22 – 2020-11-24 (×8): 1 mg via INTRAVENOUS
  Filled 2020-11-22 (×8): qty 1

## 2020-11-22 MED ORDER — GLYCOPYRROLATE 1 MG PO TABS
1.0000 mg | ORAL_TABLET | Freq: Three times a day (TID) | ORAL | Status: DC
  Administered 2020-11-22 – 2020-11-25 (×9): 1 mg via ORAL
  Filled 2020-11-22 (×12): qty 1

## 2020-11-22 NOTE — Progress Notes (Signed)
Rapid Response, RN spoke with Kennon Holter, NP will hold off on completing NIH scale, will evaluate in am d/t pt severely confused and unable to follow any commands. Thanks.

## 2020-11-22 NOTE — Consult Note (Signed)
Consultation Note Date: 11/22/2020   Patient Name: Jeffery Rogers  DOB: April 15, 1932  MRN: [id number]  Age / Sex: 87 y.o., male  PCP: [name], MD Referring Physician: Deatra James, MD  Reason for Consultation: Establishing goals of care  HPI/Patient Profile: 87 y.o. male admitted on 11/21/2020   Jeffery Rogers is a 87 y.o. male with a PMHx of HLD, AF, heart block with hx of PPM placement, and dementia. Family, particularly patient's wife is primary historian who noted that 3 weeks ago, patient could perform his own ADLs and would walk about 1 mile around his neighborhood in [place], [place].However,he declined quickly to not being able to perform ADLs and not walking and he seemed to be more withdrawn. His outpatient neurologist did a CT head which was negative for acute findings.   He had seen urology for a UTI and hematuria. Urologist and son thought coming back to ED was prudent.   In the ED, he had a brief episode of hypoxia and CTA PE, and CT abd/pelvis were negative. There was a concern for stroke and MRI was recommended.    IV antibiotics were started here for UTI, patient with frank hematuria,additionally, patient has been seen by neurology, remains admitted to hospital medicine service.   Palliative consult for broad goals of care and to discuss patient preferences and to provide patient/family desired goal concordant care.   Patient resting in bed, no distress, wife at bedside, I introduced myself and palliative care as follows: Palliative medicine is specialized medical care for people living with serious illness. It focuses on providing relief from the symptoms and stress of a serious illness. The goal is to improve quality of life for both the patient and the family.  Goals of care: Broad aims of medical therapy in relation to the patient's values and preferences. Our aim is to provide  medical care aimed at enabling patients to achieve the goals that matter most to them, given the circumstances of their particular medical situation and their constraints.   Goals, wishes and values attempted to be explored, patient's wife is historian. Patient was agitated earlier today, patient was independent up until the past 2-3 weeks. Patient's wife describes him as a good person. She states that their overall goals are to avoid prolongation of suffering and to maximize and prioritize patient's comfort at all times. Disposition at this time is SNF rehab with palliative is being considered, see below.      NEXT OF KIN Wife Has a son and daughter. Son lives in [place], is visiting [place] and living with patient and his wife currently.    SUMMARY OF RECOMMENDATIONS   Agree with DNR.  Continue gentle treatments, maximize comfort and avoidance of suffering.  SNF rehab with palliative if it seems plausible, otherwise, will have to explore further options with hospice support, if patient has acute precipitous decline in this hospitalization.  PMT to follow.  Thank you for the consult.   Code Status/Advance Care Planning:  DNR  Symptom Management:     Palliative Prophylaxis:   Delirium Protocol  Psycho-social/Spiritual:   Desire for further Chaplaincy support:yes  Additional Recommendations: Caregiving  Support/Resources  Prognosis:   Unable to determine  Discharge Planning: To Be Determined      Primary Diagnoses: Present on Admission: . AMS (altered mental status) . Essential tremor . Elevated CPK . Persistent atrial fibrillation ([place]) . Memory loss . Dementia with behavioral disturbance ([place]) . Late onset Alzheimer's disease with behavioral disturbance ([place]) . Confusion   I have reviewed the medical record, interviewed the patient and family, and examined the patient. The following aspects are pertinent.  Past Medical History:  Diagnosis Date  . Arthritis     "right arm/shoulder" (02/10/2016)  . Complete heart block ([place]) 02/10/2016   a. s/p Emergency planning/management officer  . Goiter   . Hypercholesteremia   . Memory loss   . Nasal septal deviation   . Persistent atrial fibrillation ([place])   . Presence of permanent cardiac pacemaker   . 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 Severe aortic stenosis   . Tremor    Social History   Socioeconomic History  . Marital status: Married    Spouse name: Not on file  . Number of children: 2  . Years of education: Bachelors  . Highest education level: Not on file  Occupational History  . Occupation: Retired Chief Financial Officer  Tobacco Use  . Smoking status: Former Smoker    Packs/day: 1.00    Years: 16.00    Pack years: 16.00    Types: Cigarettes  . Smokeless tobacco: Never Used  . Tobacco comment: Quit smoking cigarettes in 1971  Vaping Use  . Vaping Use: Never used  Substance and Sexual Activity  . Alcohol use: Never    Alcohol/week: 2.0 standard drinks    Types: 1 Cans of beer, 1 Shots of liquor per week  . Drug use: No  . Sexual activity: Yes  Other Topics Concern  . Not on file  Social History Narrative   Lives at home with his wife.   Right-handed.   1 cup coffee per day.  Occasional Coke.   Social Determinants of Health   Financial Resource Strain: Not on file  Food Insecurity: Not on file  Transportation Needs: Not on file  Physical Activity: Not on file  Stress: Not on file  Social Connections: Not on file   Family History  Problem Relation Age of Onset  . Stroke Other   . Irregular heart beat Other   . Stroke Mother   . Stroke Father   . CAD Father        MI   Scheduled Meds: .  stroke: mapping our early stages of recovery book   Does not apply Once  . ALPRAZolam  0.25 mg Oral BID  . amLODipine  2.5 mg Oral Daily  . Chlorhexidine Gluconate Cloth  6 each  Topical Daily  . glycopyrrolate  1 mg Oral TID   Continuous Infusions: . sodium chloride 75 mL/hr at 11/22/20 1205  . cefTRIAXone (ROCEPHIN)  IV 2 g (11/22/20 1413)   PRN Meds:.acetaminophen **OR** acetaminophen (TYLENOL) oral liquid 160 mg/5 mL **OR** acetaminophen, morphine injection, polyvinyl alcohol, senna-docusate Medications Prior to Admission:  Prior to Admission medications   Medication Sig Start Date End Date Taking? Authorizing Provider  acetaminophen (TYLENOL) 500 MG tablet Take 500-1,000 mg by  mouth every 6 (six) hours as needed for mild pain, fever or headache.   Yes [provider]  ALPRAZolam (XANAX) 0.25 MG tablet Take 0.25 mg by mouth 3 (three) times daily as needed for anxiety.   Yes [provider]  amLODipine (NORVASC) 5 MG tablet Take 1 tablet by mouth daily. Patient taking differently: Take 2.5 mg by mouth daily. 04/15/20  Yes Eileen Stanford, PA-C  apixaban (ELIQUIS) 5 MG TABS tablet Take 1 tablet (5 mg total) by mouth 2 (two) times daily. 02/08/17  Yes Silverio Lance, MD  ipratropium (ATROVENT) 0.03 % nasal spray Place 2 sprays into both nostrils 4 (four) times daily as needed for rhinitis.   Yes [provider]  meloxicam (MOBIC) 7.5 MG tablet Take 7.5 mg by mouth 2 (two) times daily. 11/14/20  Yes [provider]  mirtazapine (REMERON) 7.5 MG tablet Take 7.5 mg by mouth daily. 02/19/20  Yes [provider]  oxyCODONE-acetaminophen (PERCOCET) 5-325 MG tablet Take 1-2 tablets by mouth every 6 (six) hours as needed. Patient taking differently: Take 1-2 tablets by mouth every 6 (six) hours as needed for moderate pain. 11/13/20  Yes Charlesetta Shanks, MD  polyvinyl alcohol (LIQUIFILM TEARS) 1.4 % ophthalmic solution Place 1 drop into both eyes as needed for dry eyes.   Yes [provider]  QUEtiapine (SEROQUEL) 50 MG tablet Take 2 tablets (100 mg total) by mouth at bedtime. 09/03/20  Yes Marcial Pacas, MD   sulfamethoxazole-trimethoprim (BACTRIM DS) 800-160 MG tablet Take 1 tablet by mouth 2 (two) times daily. 11/13/20  Yes [provider]  ARIPiprazole (ABILIFY) 10 MG tablet Take 1 tablet (10 mg total) by mouth daily. Patient not taking: Reported on 11/21/2020 09/03/20   Marcial Pacas, MD  atorvastatin (LIPITOR) 40 MG tablet Take 1 tablet (40 mg total) by mouth daily. Patient not taking: No sig reported 05/24/17   Liuzzi Lance, MD  donepezil (ARICEPT) 10 MG tablet Take 1 tablet by mouth at bedtime. Patient not taking: Reported on 11/21/2020 03/17/20   Marcial Pacas, MD  L-Methylfolate-Algae-B12-B6 [facility]) 3-90.314-2-35 MG CAPS Take 1 capsule by mouth 2 (two) times daily. Patient not taking: Reported on 11/21/2020 03/25/20   Marcial Pacas, MD  memantine (NAMENDA) 10 MG tablet Take 1 tablet by mouth twice daily. Patient not taking: No sig reported 03/17/20   Marcial Pacas, MD   No Known Allergies Review of Systems Non verbal with me, just received Morphine resting in bed.   Physical Exam Elderly gentleman resting in bed No distress Appears with generalized weakness Regular work of breathing S 1 S 2  Abdomen not distended  Vital Signs: BP (!) 157/76 (BP Location: Right Arm)   Pulse 60   Temp 99 F (37.2 C) (Oral)   Resp 18   Ht 6' (1.829 m)   SpO2 100%   BMI 23.33 kg/m  Pain Scale: PAINAD   Pain Score: 0-No pain (decreased agitation)   SpO2: SpO2: 100 % O2 Device:SpO2: 100 % O2 Flow Rate: .O2 Flow Rate (L/min): 2 L/min  IO: Intake/output summary:   Intake/Output Summary (Last 24 hours) at 11/22/2020 1608 Last data filed at 11/22/2020 1510 Gross per 24 hour  Intake 1142.14 ml  Output 575 ml  Net 567.14 ml    LBM:   Baseline Weight:   Most recent weight:       Palliative Assessment/Data:   PPS 30%  Time In: 1500 Time Out:  1600 Time Total:  60 min.  Greater  than 50%  of this time was spent counseling and coordinating care related to the above assessment and  plan.  Signed by: Loistine Chance, MD   Please contact Palliative Medicine Team phone at [phone] for questions and concerns.  For individual provider: See Shea Zavadil

## 2020-11-22 NOTE — Plan of Care (Signed)
  Problem: Pain Managment: Goal: General experience of comfort will improve Outcome: Progressing   Problem: Education: Goal: Knowledge of General Education information will improve Description: Including pain rating scale, medication(s)/side effects and non-pharmacologic comfort measures Outcome: Not Progressing   Problem: Health Behavior/Discharge Planning: Goal: Ability to manage health-related needs will improve Outcome: Not Progressing

## 2020-11-22 NOTE — Plan of Care (Signed)
  Problem: Clinical Measurements: Goal: Ability to maintain clinical measurements within normal limits will improve Outcome: Progressing   

## 2020-11-22 NOTE — Evaluation (Signed)
Occupational Therapy Evaluation Patient Details Name: Jeffery Rogers MRN: [id number] DOB: [birth year] Today's Date: 11/22/2020    History of Present Illness Jeffery Rogers is a 87 y.o. male with medical history significant of complete heart block s/p pacer, a fib, HLD, dementia presents with family c/o steady decline and mentation and function since January.   Clinical Impression   Patient is currently requiring assistance with ADLs including total assistance with toileting, and with LE dressing, maximum assist with bathing, and moderate assist with UE dressing, and at least minimal assist with grooming and eating, all of which is below patient's typical baseline prior to January where pt received supervision due to memory impairment, but was very able bodied and could engage in ADLs with supervision.  During this evaluation, patient was limited by generalized weakness, impaired memory/cognition, difficulty opening eyes to attend to activities, and limited activity tolerance, which has the potential to impact patient's and caregiver's safety and independence during functional mobility, as well as performance for ADLs. [name] "6-clicks" Daily Activity Inpatient Short Form score of 11/24 indicates 70.42% ADL impairment this session. Patient lives with his wife, who is able to provide 24/7 supervision but not assistance as wife is disabled and dependent on a cane.  Patient demonstrates good rehab potential, and should benefit from continued skilled occupational therapy services while in acute care to maximize safety, independence and quality of life at home.  Continued occupational therapy services in a SNF setting prior to return home is recommended.  ?   Follow Up Recommendations  SNF    Equipment Recommendations   (Will defer to post-acute recommendations.)    Recommendations for Other Services       Precautions / Restrictions Precautions Precautions:  Fall Restrictions Weight Bearing Restrictions: No      Mobility Bed Mobility Overal bed mobility: Needs Assistance Bed Mobility: Supine to Sit     Supine to sit: Max assist;HOB elevated     General bed mobility comments: max A to upright trunk and slide BLE over to EOB    Transfers Overall transfer level: Needs assistance Equipment used: Rolling walker (2 wheeled);2 person hand held assist Transfers: Sit to/from [facility] Sit to Stand: Min assist;+2 physical assistance Stand pivot transfers: Min assist;+2 physical assistance       General transfer comment: min A +2 with RW and with 2 person HHA to steady. Initial STS and pivot with RW, pt requires assist to maintain grip on RW to rise to standing, then pushes RW away while pivoting over to [facility]. 2nd STS and pivot with bil HHA, pt able to rise and steadied over to chair with therapist's managing lines for safety    Balance Overall balance assessment: Needs assistance Sitting-balance support: Feet supported;Bilateral upper extremity supported Sitting balance-Leahy Scale: Fair Sitting balance - Comments: seated EOB, close SUPV   Standing balance support: During functional activity;Bilateral upper extremity supported Standing balance-Leahy Scale: Poor Standing balance comment: reliant on UE support                           ADL either performed or assessed with clinical judgement   ADL Overall ADL's : Needs assistance/impaired Eating/Feeding: Minimal assistance;Cueing for sequencing;Sitting   Grooming: Moderate assistance;Sitting;Cueing for sequencing   Upper Body Bathing: Moderate assistance;Cueing for sequencing;Sitting   Lower Body Bathing: Sitting/lateral leans;Sit to/from stand;Maximal assistance   Upper Body Dressing : Moderate assistance;Sitting   Lower Body Dressing: Total assistance;Sitting/lateral leans  Lower Body Dressing Details (indicate cue type and reason): Pt required Total  assist to don socks. Toilet Transfer: BSC;Minimal assistance;+2 for physical assistance;Cueing for sequencing;Cueing for safety;Stand-pivot Toilet Transfer Details (indicate cue type and reason): Pt reaching for BSC arm, and leaving RW behind. Transitioned to HHA to safety with pt requiring 2nd person to manage hips over commode before pt initiated sit down. Toileting- Clothing Manipulation and Hygiene: Total assistance;Sit to/from stand;+2 for physical assistance Toileting - Clothing Manipulation Details (indicate cue type and reason): Pt stood with assist from PT while OT performed total assist peri care and clothing management.     Functional mobility during ADLs: Minimal assistance;Maximal assistance;+2 for physical assistance;Rolling walker;Cueing for safety;Cueing for sequencing       Vision   Additional Comments: Pt kept eyes closed majority of visit. Once in chair pt c/o eyes "burning" and provided with cool wash cloth and promted to wipe eyes gently. Pt followed instructions, then opening eyes more and stated eyes felt better. Unable to formally test vision this visit due to pt lethargy and confusion,     Perception     Praxis      Pertinent Vitals/Pain Pain Assessment: Faces Faces Pain Scale: Hurts a little bit Pain Location: low back in sitting Pain Descriptors / Indicators: Aching;Sore Pain Intervention(s): Limited activity within patient's tolerance;Monitored during session;Repositioned (Pt denied back pain leter during session when monitored.)     Hand Dominance Right   Extremity/Trunk Assessment Upper Extremity Assessment Upper Extremity Assessment: Overall WFL for tasks assessed (grossly 4/5 to 4-/5 throughout)   Lower Extremity Assessment Lower Extremity Assessment: Defer to PT evaluation   Cervical / Trunk Assessment Cervical / Trunk Assessment: Normal   Communication Communication Communication: No difficulties   Cognition Arousal/Alertness:  Lethargic Behavior During Therapy: Flat affect Overall Cognitive Status: Impaired/Different from baseline                                 General Comments: Spouse in room, reports pt has been flat and eyes closed over the past couple of weeks. Pt requiring increased time and cues with mobility, opens eyes 50% of the time to commands.   General Comments       Exercises     Shoulder Instructions      Home Living Family/patient expects to be discharged to:: Skilled nursing facility Living Arrangements: Spouse/significant other   Type of Home: House Home Access: Ramped entrance     Home Layout: Two level;Able to live on main level with bedroom/bathroom               Home Equipment: Walker - 4 wheels          Prior Functioning/Environment Level of Independence: Needs assistance  Gait / Transfers Assistance Needed: Spouse reports pt currenly requiring assist to ambulate limited household distances with rollator walker. ADL's / Homemaking Assistance Needed: Spouse reports pt currently requiring assist with ADLs.  Prior to January, pt required far less assistance per spouse.   Comments: Spouse reports pt was walking 1 mile without AD at end of January, steady decline since then requiring assist with ADLs, ambulation and HHPT.        OT Problem List: Decreased strength;Decreased cognition;Decreased activity tolerance;Decreased safety awareness;Impaired balance (sitting and/or standing);Decreased knowledge of use of DME or AE;Impaired vision/perception;Decreased knowledge of precautions      OT Treatment/Interventions: Self-care/ADL training;Therapeutic exercise;Therapeutic activities;Cognitive remediation/compensation;Visual/perceptual remediation/compensation;DME and/or AE instruction;Patient/family education;Balance training  OT Goals(Current goals can be found in the care plan section) Acute Rehab OT Goals Patient Stated Goal: spouse states skilled  facility for therapy before returning home OT Goal Formulation: With family ADL Goals Pt Will Perform Grooming: standing;with supervision Pt Will Perform Upper Body Dressing: with set-up;with supervision Pt Will Perform Lower Body Dressing: with set-up;with supervision;sit to/from stand;sitting/lateral leans Pt Will Transfer to Toilet: with supervision;ambulating Pt Will Perform Toileting - Clothing Manipulation and hygiene: with caregiver independent in assisting;with min guard assist;sit to/from stand;sitting/lateral leans Pt/caregiver will Perform Home Exercise Program: Both right and left upper extremity;Increased strength;With Supervision;With theraband  OT Frequency: Min 2X/week   Barriers to D/C:    Spouse is disabled and only caregiver. Pt and spouse would not be safe with a discharge home at this time.       Co-evaluation PT/OT/SLP Co-Evaluation/Treatment: Yes Reason for Co-Treatment: For patient/therapist safety;To address functional/ADL transfers;Necessary to address cognition/behavior during functional activity PT goals addressed during session: Mobility/safety with mobility;Balance;Proper use of DME OT goals addressed during session: ADL's and self-care      AM-PAC OT "6 Clicks" Daily Activity     Outcome Measure Help from another person eating meals?: A Little Help from another person taking care of personal grooming?: A Lot Help from another person toileting, which includes using toliet, bedpan, or urinal?: Total Help from another person bathing (including washing, rinsing, drying)?: A Lot Help from another person to put on and taking off regular upper body clothing?: A Lot Help from another person to put on and taking off regular lower body clothing?: Total 6 Click Score: 11   End of Session Equipment Utilized During Treatment: Gait belt;Rolling walker Nurse Communication: Mobility status  Activity Tolerance: Patient tolerated treatment well;Patient limited by  lethargy Patient left: with family/visitor present;with chair alarm set;with call bell/phone within reach;in chair  OT Visit Diagnosis: Unsteadiness on feet (R26.81);Other symptoms and signs involving cognitive function;Muscle weakness (generalized) (M62.81)                Time: 4982-6415 OT Time Calculation (min): 32 min Charges:  OT General Charges $OT Visit: 1 Visit OT Evaluation $OT Eval Moderate Complexity: 1 Mod  Sven Pinheiro, OT Acute Rehab Services Office: [phone] 11/22/2020   Julien Girt 11/22/2020, 12:36 PM

## 2020-11-22 NOTE — Evaluation (Signed)
Clinical/Bedside Swallow Evaluation Patient Details  Name: Jeffery Rogers MRN: [id number] Date of Birth: [birth year]  Today's Date: 11/22/2020 Time: SLP Start Time (ACUTE ONLY): 2706 SLP Stop Time (ACUTE ONLY): 1805 SLP Time Calculation (min) (ACUTE ONLY): 50 min  Past Medical History:  Past Medical History:  Diagnosis Date  . Arthritis    "right arm/shoulder" (02/10/2016)  . Complete heart block ([place]) 02/10/2016   a. s/p Emergency planning/management officer  . Goiter   . Hypercholesteremia   . Memory loss   . Nasal septal deviation   . Persistent atrial fibrillation ([place])   . Presence of permanent cardiac pacemaker   . 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 Severe aortic stenosis   . Tremor    Past Surgical History:  Past Surgical History:  Procedure Laterality Date  . CATARACT EXTRACTION W/ INTRAOCULAR LENS  IMPLANT, BILATERAL Bilateral ~ 2000  . EP IMPLANTABLE DEVICE N/A 02/11/2016   Procedure: Pacemaker Implant;  Surgeon: Hellums Lance, MD;  Location: [place] [facility];  Service: Cardiovascular;  Laterality: N/A;  . RIGHT/LEFT HEART CATH AND CORONARY ANGIOGRAPHY N/A 05/03/2018   Procedure: RIGHT/LEFT HEART CATH AND CORONARY ANGIOGRAPHY;  Surgeon: Burnell Blanks, MD;  Location: [place] [facility];  Service: Cardiovascular;  Laterality: N/A;  . TEE WITHOUT CARDIOVERSION N/A 05/30/2018   Procedure: TRANSESOPHAGEAL ECHOCARDIOGRAM (TEE);  Surgeon: Burnell Blanks, MD;  Location: [place];  Service: Open Heart Surgery;  Laterality: N/A;  . TONSILLECTOMY  1930s  . TRANSCATHETER AORTIC VALVE REPLACEMENT, TRANSFEMORAL N/A 05/30/2018   Procedure: TRANSCATHETER AORTIC VALVE REPLACEMENT, TRANSFEMORAL using a 54mm Edwards Sapien 3 Aortic Valve;  Surgeon: Burnell Blanks, MD;  Location: [place];  Service: Open Heart Surgery;  Laterality: N/A;  . WISDOM TOOTH  EXTRACTION  1990s   HPI:  87 yo male adm to [facility] with AMS, progessive weakness, decreased po intake.  Pt has h/o heart block s/p pacer, dementia,uriary retention s/p chronic foley, UTI.  Imaging studies negative for acute finding.  Wife present and reports pt with increased difficulty with solids, orally holding with delayed swallow.  MRI/Imaging of brain showed Nothing acute, Extensive chronic small-vessel  ischemic changes throughout the brain. Old left occipital cortical  infarction.  Pt has h/o cervical disc disease from C4-C5, C5-C6, C6-C7.  Swallow evaluation ordered.  Wife has been giving pt pureed foods at home due to his dysphagia.   He normally drinks water well but has decreased po intake over the last 3 weeks and has been fed by wife.   Assessment / Plan / Recommendation Clinical Impression  Jeffery Rogers presents with dysphagia consistent with diagnosis of dysphagia with dementia.  Delayed oral transiting clinically present but fortunately pt able to inform feeder when oral cavity is clear.  No focal CN deficits apparent, pt does have essential tremor per wife - tremorous lingual movement noted with mouth opening.  He was able to help SLP feed him by assisting to hold his cup/spoon, etc which can facilitate readiness for po/neural input for swallow safety.    No indication of aspiration with all consistencies tested. Did not conduct 3 ounce [place] due to pt's decreased ability to follow directions.  Pt was able to masticate a few bites of graham cracker - and use of icecream was helpful for oral transiting x1.    Wife present and reports she gives pt puree foods  at home.  For institutionalized feeding, puree/thin advised for improved efficiency, however recommend family be able to provide the pt with soft solids as tolerated.  Jeffery Rogers is aware of her spouses dysphagia and has been helping him to compensate over the last several weeks fortunately.  She states he informs her when he is ready for more  or has cleared his mouth.  SLP educated Jeffery Rogers thoroughly to potential helpful compensation strategies, trying finger foods, gustatory changes with progression of dementia, variabilty in swallow performance and importance of oral hygiene.    Given pt with occasional coughing with water prior to admit, advised to consider having him drink Ensure, peach nectar, V8, or thicker liquid with meals when more likely to aspirate and WATER between meals for hydration.    No SLP follow up as all education completed to help mitigate the chronic/progressive issue. SLP Visit Diagnosis: Dysphagia, oral phase (R13.11)    Aspiration Risk  Moderate aspiration risk    Diet Recommendation Dysphagia 1 (Puree);Thin liquid (family ok to give soft solids= finger foods encouraged)   Supervision: Full supervision/cueing for compensatory strategies Compensations: Slow rate;Small sips/bites Postural Changes: Seated upright at 90 degrees;Remain upright for at least 30 minutes after po intake    Other  Recommendations Oral Care Recommendations: Oral care BID   Follow up Recommendations None      Frequency and Duration     n/a       Prognosis n/a   Swallow Study   General Date of Onset: 11/22/20 HPI: 87 yo male adm to [facility] with AMS, progessive weakness, decreased po intake.  Pt has h/o heart block s/p pacer, dementia,uriary retention s/p chronic foley, UTI.  Imaging studies negative for acute finding.  Wife present and reports pt with increased difficulty with solids, orally holding with delayed swallow.  MRI/Imaging of brain showed Nothing acute, Extensive chronic small-vessel  ischemic changes throughout the brain. Old left occipital cortical  infarction.  Pt has h/o cervical disc disease from C4-C5, C5-C6, C6-C7.  Swallow evaluation ordered.  Wife has been giving pt pureed foods at home due to his dysphagia.   He normally drinks water well but has decreased po intake over the last 3 weeks and has been fed by  wife. Type of Study: Bedside Swallow Evaluation Diet Prior to this Study: NPO Temperature Spikes Noted: No Respiratory Status: Room air History of Recent Intubation: No Behavior/Cognition: Alert;Requires cueing;Other (Comment) (delayed responses) Oral Care Completed by SLP: No Oral Cavity - Dentition: Adequate natural dentition Self-Feeding Abilities: Needs assist;Other (Comment) (provided him hand over hand assist) Patient Positioning: Upright in bed Baseline Vocal Quality: Low vocal intensity Volitional Cough: Cognitively unable to elicit Volitional Swallow: Unable to elicit    Oral/Motor/Sensory Function Overall Oral Motor/Sensory Function: Generalized oral weakness (lingual tremor noted)   Ice Chips Ice chips: Not tested   Thin Liquid Thin Liquid: Impaired Presentation: Cup Oral Phase Functional Implications: Prolonged oral transit Pharyngeal  Phase Impairments: Suspected delayed Swallow    Nectar Thick Nectar Thick Liquid: Impaired Presentation: Straw;Spoon Oral phase functional implications: Prolonged oral transit Pharyngeal Phase Impairments: Suspected delayed Swallow   Honey Thick Honey Thick Liquid: Not tested   Puree Puree: Impaired Presentation: Spoon Oral Phase Functional Implications: Prolonged oral transit;Oral holding Pharyngeal Phase Impairments: Suspected delayed Swallow   Solid     Solid: Impaired Oral Phase Functional Implications: Prolonged oral transit;Oral residue Other Comments: use of puree helpful to orally transit masticated solids      Luanna Salk  Ann 11/22/2020,6:56 PM   Kathleen Lime, MS [facility] SLP Acute Rehab Services Office [phone] Pager [phone]

## 2020-11-22 NOTE — Consult Note (Signed)
Neurology Consultation  Reason for Consult: decline in physical and mental status Referring Physician: Sylvan Cheese, MD  CC: decline in physical and mental status   History is obtained from: son and chart.   HPI: Jeffery Rogers is a 87 y.o. male with a PMHx of HLD, AF, heart block with hx of PPM placement, and dementia. Per son, 3 weeks ago, patient could performs his own ADLs. He declined quickly to not being able to perform ADLs and not walking. He seemed to be more withdrawn. His outpatient neurologist did a CT head which was negative for acute findings.   He had seen urology for a UTI and hematuria. Urologist and son thought coming back to ED was prudent.   In the ED, he had a brief episode of hypoxia and CTA PE, and CT abd/pelvis were negative. There was a concern for stroke and we recommended MRI.   After IV antibiotics were started here, the son feels like he has improved his mental status. Per son, patient's Aricept, Namenda, and Abilify weeks ago on neurology advice. His Eliquis is presently on hold secondary to hematuria.   Per son and wife, patient is ready to die. They do not wish for any invasive procedure and goal is to make sure patient remains comfortable. A palliative consult has been ordered.   ROS: Unable to obtain due to altered mental status.   Past Medical History:  Diagnosis Date  . Arthritis    "right arm/shoulder" (02/10/2016)  . Complete heart block ([place]) 02/10/2016   a. s/p Emergency planning/management officer  . Goiter   . Hypercholesteremia   . Memory loss   . Nasal septal deviation   . Persistent atrial fibrillation ([place])   . Presence of permanent cardiac pacemaker   . 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 Severe aortic stenosis   . Tremor     Family History  Problem Relation Age of Onset  . Stroke Other   . Irregular heart beat Other   .  Stroke Mother   . Stroke Father   . CAD Father        MI   Social History:   reports that he has quit smoking. His smoking use included cigarettes. He has a 16.00 pack-year smoking history. He has never used smokeless tobacco. He reports that he does not drink alcohol and does not use drugs.  Medications  Current Facility-Administered Medications:  .   stroke: mapping our early stages of recovery book, , Does not apply, Once, Marylyn Ishihara, Tyrone A, DO .  0.9 %  sodium chloride infusion, , Intravenous, Continuous, Kyle, Tyrone A, DO, Last Rate: 75 mL/hr at 11/22/20 1205, New Bag at 11/22/20 1205 .  acetaminophen (TYLENOL) tablet 650 mg, 650 mg, Oral, Q4H PRN **OR** acetaminophen (TYLENOL) 160 MG/5ML solution 650 mg, 650 mg, Per Tube, Q4H PRN **OR** acetaminophen (TYLENOL) suppository 650 mg, 650 mg, Rectal, Q4H PRN, Marylyn Ishihara, Tyrone A, DO .  ALPRAZolam (XANAX) tablet 0.25 mg, 0.25 mg, Oral, BID, Shahmehdi, Seyed A, MD .  amLODipine (NORVASC) tablet 2.5 mg, 2.5 mg, Oral, Daily, Kyle, Tyrone A, DO .  cefTRIAXone (ROCEPHIN) 2 g in sodium chloride 0.9 % 100 mL IVPB, 2 g, Intravenous, Q24H, Kyle, Tyrone A, DO .  Chlorhexidine Gluconate Cloth 2 % PADS 6 each, 6 each, Topical, Daily, Kyle, Tyrone A, DO .  glycopyrrolate (ROBINUL) tablet 1 mg,  1 mg, Oral, TID, Shahmehdi, Seyed A, MD .  morphine 2 MG/ML injection 1 mg, 1 mg, Intravenous, Q4H PRN, Shahmehdi, Seyed A, MD, 1 mg at 11/22/20 1237 .  polyvinyl alcohol (LIQUIFILM TEARS) 1.4 % ophthalmic solution 1 drop, 1 drop, Both Eyes, PRN, Marylyn Ishihara, Tyrone A, DO .  senna-docusate (Senokot-S) tablet 1 tablet, 1 tablet, Oral, QHS PRN, Marylyn Ishihara, Tyrone A, DO   Exam: Current vital signs: BP (!) 157/76 (BP Location: Right Arm)   Pulse 60   Temp 99 F (37.2 C) (Oral)   Resp 18   Ht 6' (1.829 m)   SpO2 100%   BMI 23.33 kg/m  Vital signs in last 24 hours: Temp:  [97.5 F (36.4 C)-99 F (37.2 C)] 99 F (37.2 C) (02/19 1259) Pulse Rate:  [59-61] 60 (02/19 1259) Resp:   [15-20] 18 (02/19 1259) BP: (121-171)/(57-104) 157/76 (02/19 1259) SpO2:  [97 %-100 %] 100 % (02/19 1259)  GENERAL: Awake, alert in NAD HEENT: - Normocephalic and atraumatic, no LN++, no hyromegaly LUNGS - Normal respiratory effort.  CV - RRR  ABDOMEN - Soft, nontender Ext: warm, well perfused Psych: confused. Affect light, calm and cooperative.    NEURO:  Mental Status: Alert, oriented to self. Follows simple commands and more complicated one with coaching.  Speech/Language: speech is without dysarthria. No aphasia, he does hesitate on answering questions, but is HOH. If question is repeated loudly, he is able to answer and perform tasks. Naming, repetition, fluency, and comprehension intact but slowed Cranial Nerves:  II: PERRL visual fields full.  III, IV, VI: EOMI. Lid elevation symmetric and full.  V: sensation is intact and symmetrical to face. Moves jaw back and forth.  VII: Smile is symmetrical. Able to puff cheeks and raise eyebrows.  VIII:hearing intact to voice with loud examiner voice.  IX, X: palate elevation is symmetric. Phonation normal.  XI: normal sternocleidomastoid and trapezius muscle strength QMV:HQIONG is symmetrical without fasciculations.   Motor: 5/5 strength is all muscle groups.  Tone is normal. Bulk is normal.  Sensation- Intact to light touch bilaterally in all four extremities. Extinction absent.   Coordination: FTN intact bilaterally. He is able to tap fingers and roll arms without noted ataxia.   DTRs: 1+ bicep on BUE and bilateral LEs.  Gait- deferred  Labs I have reviewed labs in epic and the results pertinent to this consultation are:   CBC    Component Value Date/Time   WBC 7.7 11/22/2020 0514   RBC 4.27 11/22/2020 0514   HGB 13.3 11/22/2020 0514   HGB 14.9 11/20/2020 1123   HCT 40.7 11/22/2020 0514   HCT 43.5 11/20/2020 1123   PLT 140 (L) 11/22/2020 0514   PLT 168 11/20/2020 1123   MCV 95.3 11/22/2020 0514   MCV 92 11/20/2020 1123    MCH 31.1 11/22/2020 0514   MCHC 32.7 11/22/2020 0514   RDW 13.7 11/22/2020 0514   RDW 12.9 11/20/2020 1123   LYMPHSABS 1.2 11/22/2020 0514   LYMPHSABS 1.3 11/20/2020 1123   MONOABS 0.6 11/22/2020 0514   EOSABS 0.2 11/22/2020 0514   EOSABS 0.1 11/20/2020 1123   BASOSABS 0.1 11/22/2020 0514   BASOSABS 0.1 11/20/2020 1123    CMP     Component Value Date/Time   NA 136 11/22/2020 0514   NA 135 11/20/2020 1123   K 4.5 11/22/2020 0514   CL 103 11/22/2020 0514   CO2 24 11/22/2020 0514   GLUCOSE 84 11/22/2020 0514   BUN 19 11/22/2020 0514  BUN 23 11/20/2020 1123   CREATININE 1.06 11/22/2020 0514   CALCIUM 8.8 (L) 11/22/2020 0514   PROT 6.2 (L) 11/22/2020 0514   PROT 6.9 11/20/2020 1123   ALBUMIN 3.4 (L) 11/22/2020 0514   ALBUMIN 4.3 11/20/2020 1123   AST 55 (H) 11/22/2020 0514   ALT 40 11/22/2020 0514   ALKPHOS 83 11/22/2020 0514   BILITOT 0.9 11/22/2020 0514   BILITOT 0.6 11/20/2020 1123   GFRNONAA >60 11/22/2020 0514   GFRAA 59 (L) 11/20/2020 1123    Lipid Panel     Component Value Date/Time   CHOL 127 11/22/2020 0514   TRIG 50 11/22/2020 0514   HDL 52 11/22/2020 0514   CHOLHDL 2.4 11/22/2020 0514   VLDL 10 11/22/2020 0514   LDLCALC 65 11/22/2020 0514     Imaging MD reviewed the images obtained  CT head 1. No acute intracranial abnormality by CT. - small Left PCA infarct at the left occipital pole is new since 2018 but appears chronic. - Progressed cerebral white matter disease since 2018, most likely due to small vessel ischemia. 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 2. Chronic right maxillary sinusitis.  MRI brain 1. No acute or reversible finding. Generalized atrophy, possibly with some temporal lobe predominance. Extensive chronic small-vessel ischemic changes throughout the brain. 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 2. Ectasia of the basilar tip as seen previously with maximal diameter 6.4  mm.  Assessment: 87 yo male with history of dementia who experienced a decline in mental status and physical abilities over past 3 weeks. Was being treated for UTI and had to come to hospital for IV antibiotics and hematuria. His dementia meds have been d/c'd. Neurology was asked to consult. NP believes this sudden change is likely from infection as the patient has improved since receiving antibiotics. NP doubts he would have this acute change from advancement of dementia, but it is on the differential list. His B12 level, glucose, TSH,  and CRP are normal. No evidence of acute infarction on CT or MRI, so can r/o stroke as a cause of mental or physical decline.   Impression: encephalopathy presenting in an acute manner associated with UTI. He is already clearer mentally than prior to treatment of UTI.   Recommendations: -Continue treatment for UTI.  -Palliative consult. Family expressed goal is to keep him comfortable, treat the treatable, but no invasive procedures, or escalation of treatment.  -Family states they have a bed in a SNF when it is appropriate to d/c patient.    Pt seen by Clance Boll, NP/Neuro and later by MD. Note/plan to be edited by MD as needed.  Pager: [phone]  Attestation:  I saw this patient with the APP on 11/22/20, obtained pertinent aspects of the history, and performed relevant physical and neurological examination as documented. Also, I reviewed the available laboratory data and neuroimages, and other relevant tests/notes/procedures.  The patient had just received morphine for agitation and was somnolent, barely opening his eyes to gentle stimulation. I did not attempt painful stimulation to arouse him.  My examination findings include somnolence, s/p morphine.  Impression: After reviewing the story in some detail and discussing the situation with Ms. Rickert, and reviewing the MRI scans personally, I believe this is likely an acute delirium superimposed on  dementia. It seems likely to me that Mr. Scholer could recover to a state not terribly different than his baseline from a month ago. At that time, he was able  to do his ADLs, and walked "a mile a day," according to his wife. His primary neurologist has discontinued aricept, namenda and abilify in attempt to minimize any unnecessary medications. The patient is DNR/DNI and SNF disposition under hospice care is being pursued.  Although the UTI has been "treated," ongoing antibiotics seem to be helping to some extent. Also, I note that he may be dehydrated, with ketonuria on urine testing.  Recommendations: - gentle hydration, and completion of antibiotic course - consider resuming aricept and namenda (abrupt discontinuation can cause cognitive decline, which may mask any recovery he experiences from UTI/dehydration treatment; thus, he may recover from the delirium and still look much worse than his baseline if the aricept and namenda are withdrawn). - if swallowing issues prevent him from taking aricept and namenda po, then I suggest namzaric 10/28, which can be broken and sprinkled into applesauce or pudding to achieve his prior dose right away. - instead of using prn doses of sedatives to control agitation, it may also be worthwhile to consider re-starting a lower dose of the abililfy (e.g., 2.5 or 5 mg/d), or an SSRI such as celexa 20 mg qhs. The benzos and morphine are short-acting and likely to intensify his delirium or at least prolong his recovery. - as the patient was very active previously, walking a mile every day, early mobilization (with assistance) when he gets to the SNF is strongly advised.  Ms. Carfagno will discuss these recommendations with her son and others and come to a decision about whether she wishes to proceed. Neurology will be available as needed.  Thank you.  Perfecto Kingdom, MD

## 2020-11-22 NOTE — Progress Notes (Signed)
PROGRESS NOTE    Patient: Jeffery Rogers                            PCP: [name], MD                    DOB: May 28, 1932            DOA: 11/21/2020 ZOX:[id number]             DOS: 11/22/2020, 10:19 AM   LOS: 1 day   Date of Service: The patient was seen and examined on 11/22/2020  Subjective:   The patient was seen and examined this morning. Somnolent, nonverbal at this time. Per wife was agitated earlier this morning. Seems to be comfortable in bed, Foley catheter in place, visible hematuria noted Patient's wife and son present at bedside, detailed history was provided Patient wishes was also provided regarding comfort care measures as declining.  Brief Narrative:   Jeffery Rogers is a 87 y.o. male with medical history significant of complete heart block s/p pacer, a fib, HLD, dementia. History is from son at bedside as patient has dementia and is a poor historian. His son reports that at the beginning of January, the patient was able to perform all his ADLs and walk on his own. Over the last three weeks or so, he's had a steady decline and mentation and function. The son has noticed that he has increased weakness in his legs. He is now unable to walk on his own. He is unable to perform his ADLs. He is more confused and less interactive. He has been to his neurologist who did a Hooker. It was negative for acute stroke. He has been seen by urology for UTI and the ED for abdominal pain. He was started on abx for a presumed UTI. However, his overall functional status has not improved. His son felt it was time to return to the ED for assistance.   ED Course: No acute findings on CTA PE or CT ab/pelvis. He had a brief episode of hypoxia that has since resolved. There was concern for stroke. The EDP spoke with neurology. Recommended MRI. TRH was called for admission.   Assessment & Plan:   Active Problems:   AMS (altered mental status)   Altered mental status / Hx of dementia -with  behavior changes agitation, confusion     -Patient is currently somnolent, drowsy-wife and son present at bedside     -Family concurs that he is declining mentally and physically since his hospitalization this January     -On admission he is A&O x 1 (which is his baseline according to his son);      -This morning he is not following any commands, wife states he was agitated earlier this morning     -Family reports he does well with his  Po Xanax --will be resumed     -Patient has been seen and following neurology regarding continued mental decline, poor cognition   -MRI of the brain: Reviewed -no acute changes or infarct old occipital infarct, significant microvascular changes, atrophy  -MRI spine reviewed -  L4-5 where there is moderate central canal stenosis and mild narrowing in the right subarticular recess. Mild bilateral foraminal narrowing       -Due to multiple comorbidities, and progressive mental and physical decline prognosis remains              Poor --his wife  and son is aware, agreed with palliative care consultation  -Confirmed DNR/DNI status, no heroic measures to be taken     - could be a progression of his previous dementia or a slow recovery from the insult of infection     - apparently his dementia meds were held recently by neurology  UTI/frank acute hematuria /chronic urinary retention -indwelling catheter since January 22     -Patient's son reports that previous cultures in urology office was positive for morganella     - EDP spoke with urology; they stated it was reasonable to switch to IV abx to help and re-culture  -He has been restarted on IV antibiotics of Rocephin      - per EDP, urology not planning acute intervention on hematuria     - continue rocephin, check urine culture     - maintain foley     -Pilar Plate hematuria noted, monitoring H&H, treating infection, with holding his Eliquis  Severe progressive generalized weakness, BLE weakness/numbness Dysphagia?      -He is not very cooperative in exam, somnolent, withdrawn, minimally verbal and physical interactive     - CTH negative for acute stroke on 2/17     - spoke with neurology PA present at bedside, Re: Repeat MRI; believes MRI     -MRI T-spine, l-spine --- was canceled as family is agreeable that it would not change medical management, at this point     -Consulting SLP eval     - PT/OT  Afib     - hold eliquis d/t hematuria     -Monitoring heart rate closely  -Monitoring hematuria, monitoring  Failure to thrive /progressive decline     - all of this could be a progression of his dementia     - palliative care consult --we are recommending comfort care measures, if no improvement likely hospice home  HTN     -N.p.o. at this time, as needed hydralazine  -We will hold home medications  Elevated troponin     - trp flat, EKG w/ jxnal rhythm, denies CP; follow symptoms  -Comfortable at this time, in no distress, no signs of chest pain  Elevated CPK     -Continue gentle IV fluid, will monitor  Severe debility continue decline mentally and physically Prognosis very poor due to multiple comorbidities, dementia and decline mental and physical status Goal of care -discussed with patient is wife and son at bedside Palliative care consulted -we are recommending comfort care measures,.. If continued decline possibly hospice home ... Family is agreeable to the plan, agreed to no heroic measures Possibly to treat reversible causes.   DVT prophylaxis: SCDs  Code Status: DNR/DNI Family Communication:  With patient's son and wife at bedside-discussed in detail Consults called: Neurology   Status is: Inpatient  Remains inpatient appropriate because:Inpatient level of care appropriate due to severity of illness   Dispo: The patient is from: Home  Anticipated d/c is to: SNF --- palliative, possibly hospice  Anticipated d/c date is: 3 days   Patient currently is not medically stable to d/c.              Difficult to place patient No   ----------------------------------------------------------------------------------------------------------------------------------- Nutritional status:  The patient's BMI is: Body mass index is 23.33 kg/m. I agree with the assessment and plan as outlined .Marland Kitchen    ------------------------------------------------------------------------------------------------------------------------------------ Cultures; Blood Cultures x 2 >> Urine Culture  >>>   Antimicrobials: IV Rocephin   Consultants: Palliative care team/neurology   ------------------------------------------------------------------------------------------------------------------------------------  DVT prophylaxis:  SCD/Compression stockings Code Status:   Code Status: DNR  Family Communication:  Patient's son and wife present at bedside The above findings and plan of care has been discussed with his son, wife in detail,  they expressed understanding and agreement no heroic measures to be taken, any reversible issues to be addressed otherwise as patient declines agreed with comfort care measures.  -Advance care planning has been discussed.   Admission status:   Status is: Inpatient  Remains inpatient appropriate because:Hemodynamically unstable and Inpatient level of care appropriate due to severity of illness   Dispo: The patient is from: Home              Anticipated d/c is to: To be determined ?  SNF with hospice              Anticipated d/c date is: 3 days              Patient currently is not medically stable to d/c.   Difficult to place patient No    Level of care: Telemetry   Procedures:   No admission procedures for hospital encounter.     Antimicrobials:  Anti-infectives (From admission, onward)   Start     Dose/Rate Route Frequency Ordered Stop   11/22/20 1400  cefTRIAXone (ROCEPHIN) 2 g in sodium  chloride 0.9 % 100 mL IVPB        2 g 200 mL/hr over 30 Minutes Intravenous Every 24 hours 11/21/20 2058     11/21/20 1415  cefTRIAXone (ROCEPHIN) 1 g in sodium chloride 0.9 % 100 mL IVPB        1 g 200 mL/hr over 30 Minutes Intravenous  Once 11/21/20 1413 11/21/20 1614       Medication:  .  stroke: mapping our early stages of recovery book   Does not apply Once  . ALPRAZolam  0.25 mg Oral BID  . amLODipine  2.5 mg Oral Daily  . Chlorhexidine Gluconate Cloth  6 each Topical Daily  . glycopyrrolate  1 mg Oral TID    acetaminophen **OR** acetaminophen (TYLENOL) oral liquid 160 mg/5 mL **OR** acetaminophen, morphine injection, polyvinyl alcohol, senna-docusate   Objective:   Vitals:   11/21/20 2110 11/22/20 0213 11/22/20 0500 11/22/20 0558  BP: (!) 171/62 (!) 141/57  121/62  Pulse: 60 60  (!) 59  Resp: 20 18    Temp: (!) 97.5 F (36.4 C) 98 F (36.7 C)  97.7 F (36.5 C)  TempSrc: Oral Oral  Axillary  SpO2: 98% 100%  100%  Height:   6' (1.829 m)     Intake/Output Summary (Last 24 hours) at 11/22/2020 1019 Last data filed at 11/22/2020 0546 Gross per 24 hour  Intake 376.01 ml  Output 225 ml  Net 151.01 ml   There were no vitals filed for this visit.   Examination:   Physical Exam  Constitution: Opens eyes to pain stimuli, few words minimal engagement, withdrawn, nonverbal at this time does not participate in exam Psychiatric: Withdrawn, HEENT: Normocephalic, PERRL, otherwise with in Normal limits  Chest:Chest symmetric Cardio vascular:  S1/S2, RRR, No murmure, No Rubs or Gallops  pulmonary: Clear to auscultation bilaterally, respirations unlabored, negative wheezes / crackles Abdomen: Soft, non-tender, non-distended, bowel sounds,no masses, no organomegaly Muscular skeletal:  Severe generalized weaknesses, in bed Limited exam - in bed, able to move all 4 extremities Neuro: CNII-XII intact. , normal motor and sensation, reflexes intact  Extremities: No pitting edema  lower extremities, +  2 pulses  Skin: Dry, warm to touch, negative for any Rashes, No open wounds Wounds: per nursing documentation    ------------------------------------------------------------------------------------------------------------------------------------------    LABs:  CBC Latest Ref Rng & Units 11/22/2020 11/21/2020 11/20/2020  WBC 4.0 - 10.5 K/uL 7.7 8.0 9.3  Hemoglobin 13.0 - 17.0 g/dL 13.3 15.7 14.9  Hematocrit 39.0 - 52.0 % 40.7 48.4 43.5  Platelets 150 - 400 K/uL 140(L) 151 168   CMP Latest Ref Rng & Units 11/22/2020 11/21/2020 11/20/2020  Glucose 70 - 99 mg/dL 84 71 83  BUN 8 - 23 mg/dL 19 24(H) 23  Creatinine 0.61 - 1.24 mg/dL 1.06 1.24 1.26  Sodium 135 - 145 mmol/L 136 134(L) 135  Potassium 3.5 - 5.1 mmol/L 4.5 4.3 4.7  Chloride 98 - 111 mmol/L 103 100 98  CO2 22 - 32 mmol/L 24 23 18(L)  Calcium 8.9 - 10.3 mg/dL 8.8(L) 9.3 9.5  Total Protein 6.5 - 8.1 g/dL 6.2(L) 7.5 6.9  Total Bilirubin 0.3 - 1.2 mg/dL 0.9 0.9 0.6  Alkaline Phos 38 - 126 U/L 83 105 133(H)  AST 15 - 41 U/L 55(H) 60(H) 52(H)  ALT 0 - 44 U/L 40 48(H) 40       Micro Results Recent Results (from the past 240 hour(s))  Resp Panel by RT-PCR (Flu A&B, Covid) Nasopharyngeal Swab     Status: None   Collection Time: 11/21/20 10:34 AM   Specimen: Nasopharyngeal Swab; Nasopharyngeal(NP) swabs in vial transport medium  Result Value Ref Range Status   SARS Coronavirus 2 by RT PCR NEGATIVE NEGATIVE Final    Comment: (NOTE) SARS-CoV-2 target nucleic acids are NOT DETECTED.  The SARS-CoV-2 RNA is generally detectable in upper respiratory specimens during the acute phase of infection. The lowest concentration of SARS-CoV-2 viral copies this assay can detect is 138 copies/mL. A negative result does not preclude SARS-Cov-2 infection and should not be used as the sole basis for treatment or other patient management decisions. A negative result may occur with  improper specimen collection/handling,  submission of specimen other than nasopharyngeal swab, presence of viral mutation(s) within the areas targeted by this assay, and inadequate number of viral copies(<138 copies/mL). A negative result must be combined with clinical observations, patient history, and epidemiological information. The expected result is Negative.  Fact Sheet for Patients:  EntrepreneurPulse.com.au  Fact Sheet for Healthcare Providers:  IncredibleEmployment.be  This test is no t yet approved or cleared by the [place] FDA and  has been authorized for detection and/or diagnosis of SARS-CoV-2 by FDA under an Emergency Use Authorization (EUA). This EUA will remain  in effect (meaning this test can be used) for the duration of the COVID-19 declaration under Section 564(b)(1) of the Act, 21 U.S.C.section 360bbb-3(b)(1), unless the authorization is terminated  or revoked sooner.       Influenza A by PCR NEGATIVE NEGATIVE Final   Influenza B by PCR NEGATIVE NEGATIVE Final    Comment: (NOTE) The Xpert Xpress SARS-CoV-2/FLU/RSV plus assay is intended as an aid in the diagnosis of influenza from Nasopharyngeal swab specimens and should not be used as a sole basis for treatment. Nasal washings and aspirates are unacceptable for Xpert Xpress SARS-CoV-2/FLU/RSV testing.  Fact Sheet for Patients: EntrepreneurPulse.com.au  Fact Sheet for Healthcare Providers: IncredibleEmployment.be  This test is not yet approved or cleared by the [place] FDA and has been authorized for detection and/or diagnosis of SARS-CoV-2 by FDA under an Emergency Use Authorization (EUA). This EUA will remain in effect (meaning  this test can be used) for the duration of the COVID-19 declaration under Section 564(b)(1) of the Act, 21 U.S.C. section 360bbb-3(b)(1), unless the authorization is terminated or revoked.  Performed at [facility], [place] [address] [place], [place] [address]   Culture, blood (routine x 2)     Status: None (Preliminary result)   Collection Time: 11/21/20 11:24 AM   Specimen: BLOOD RIGHT ARM  Result Value Ref Range Status   Specimen Description   Final    BLOOD RIGHT ARM Performed at [facility] [address] [place], [place] [address]    Special Requests   Final    BOTTLES DRAWN AEROBIC AND ANAEROBIC Blood Culture adequate volume Performed at [facility] [address], [place], [place] [address]    Culture   Final    NO GROWTH < 24 HOURS Performed at [facility] [address] [place], [place] [address]    Report Status PENDING  Incomplete  Culture, blood (routine x 2)     Status: None (Preliminary result)   Collection Time: 11/21/20  9:30 PM   Specimen: BLOOD  Result Value Ref Range Status   Specimen Description   Final    BLOOD LEFT ANTECUBITAL Performed at [facility] [address] [place], [place] [address]    Special Requests   Final    BOTTLES DRAWN AEROBIC AND ANAEROBIC Blood Culture adequate volume Performed at [facility] [address] [place], [place] [address]    Culture   Final    NO GROWTH < 12 HOURS Performed at [facility] [address], [place], [place] [address]    Report Status PENDING  Incomplete    Radiology Reports DG Chest 2 View  Result Date: 11/13/2020 CLINICAL DATA:  Right upper back pain. EXAM: CHEST - 2 VIEW COMPARISON:  Chest x-ray dated April 16, 2019. FINDINGS: Unchanged left chest wall pacemaker. Interval enlargement of the cardiopericardial silhouette with a globular appearance. Prior TAVR. Normal pulmonary vascularity. No focal consolidation, pleural effusion, or pneumothorax. No acute osseous abnormality. IMPRESSION: 1. Interval enlargement of the cardiopericardial silhouette with a globular appearance, raising concern for pericardial  effusion. Electronically Signed   By: Titus Dubin M.D.   On: 11/13/2020 19:55   CT HEAD WO CONTRAST  Result Date: 11/21/2020 CLINICAL DATA:  87 year old male with progressive altered mental status, generalized weakness. This exam originally designated as [facility] to report, however, the [facility] Emergency Department contacted me by phone requesting urgent interpretation. EXAM: CT HEAD WITHOUT CONTRAST TECHNIQUE: Contiguous axial images were obtained from the base of the skull through the vertex without intravenous contrast. COMPARISON:  Head CT 01/14/2017. FINDINGS: Brain: Encephalomalacia at the left occipital pole is new since 2018 and associated with mild interval enlargement of the left occipital horn. Patchy bilateral cerebral white matter hypodensity has progressed since 2018, most notably the posterior limb right internal capsule on series 2, image 19. No midline shift, ventriculomegaly, mass effect, evidence of mass lesion, intracranial hemorrhage or evidence of cortically based acute infarction. Vascular: Calcified atherosclerosis at the skull base. Evidence of a small unruptured 6-7 mm basilar tip aneurysm or focal vessel ectasia (coronal image 38), probably not significantly changed since 2018. No suspicious intracranial vascular hyperdensity. Skull: No acute osseous abnormality identified. Sinuses/Orbits: Chronic right maxillary sinusitis with mucoperiosteal thickening and inspissated material. Other Visualized paranasal sinuses and mastoids are clear. Other: No acute orbit or  scalp soft tissue finding identified. IMPRESSION: 1. No acute intracranial abnormality by CT. - small Left PCA infarct at the left occipital pole is new since 2018 but appears chronic. - Progressed cerebral white matter disease since 2018, most likely due to small vessel ischemia. 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 2. Chronic right maxillary  sinusitis. The above was discussed by telephone on 11/21/2020 at 1019 hours with ED provider Georga Kaufmann. Electronically Signed   By: Genevie Ann M.D.   On: 11/21/2020 10:32   CT Angio Chest PE W and/or Wo Contrast  Result Date: 11/21/2020 CLINICAL DATA:  Chest and left flank pain. Hypoxia and altered mental status. EXAM: CT ANGIOGRAPHY CHEST CT ABDOMEN AND PELVIS WITH CONTRAST TECHNIQUE: Multidetector CT imaging of the chest was performed using the standard protocol during bolus administration of intravenous contrast. Multiplanar CT image reconstructions and MIPs were obtained to evaluate the vascular anatomy. Multidetector CT imaging of the abdomen and pelvis was performed using the standard protocol during bolus administration of intravenous contrast. CONTRAST:  120mL OMNIPAQUE IOHEXOL 350 MG/ML SOLN COMPARISON:  CT a chest, abdomen, and pelvis dated November 13, 2020. FINDINGS: CTA CHEST FINDINGS Cardiovascular: Satisfactory opacification of the pulmonary arteries to the segmental level. No evidence of pulmonary embolism. Stable cardiomegaly status post TAVR. No pericardial effusion. Unchanged mild aneurysmal dilatation of the mid ascending thoracic aorta measuring up to 4.0 cm. Coronary, aortic arch, and branch vessel atherosclerotic vascular disease. Unchanged left chest wall pacemaker. Mediastinum/Nodes: No enlarged mediastinal, hilar, or axillary lymph nodes. Unchanged 2.0 cm partially calcified hypodense nodule in the right thyroid lobe This has been evaluated on previous imaging. The trachea and esophagus demonstrate no significant findings. Lungs/Pleura: Minimal dependent subsegmental atelectasis in both lower lobes. No focal consolidation, pleural effusion, or pneumothorax. No suspicious pulmonary nodule. Biapical pleuroparenchymal scarring, greater on the right. Musculoskeletal: No chest wall abnormality. No acute or significant osseous findings. Review of the MIP images confirms the above findings. CT  ABDOMEN AND PELVIS FINDINGS Hepatobiliary: No focal liver abnormality. Unchanged gallstone. No gallbladder wall thickening or biliary dilatation. Probable adenomyomatosis at the gallbladder fundus. Pancreas: Unremarkable. No pancreatic ductal dilatation or surrounding inflammatory changes. Spleen: Normal in size without focal abnormality. Adrenals/Urinary Tract: Adrenal glands are unremarkable. Unchanged bilateral renal cysts. No renal calculi or hydronephrosis. The bladder is decompressed by Foley catheter. Stomach/Bowel: Stomach is within normal limits. Appendix appears normal. No evidence of bowel wall thickening, distention, or inflammatory changes. Redundant sigmoid colon. Vascular/Lymphatic: Aortic atherosclerosis. No enlarged abdominal or pelvic lymph nodes. Reproductive: Unchanged mild prostatomegaly. Other: No abdominal wall hernia or abnormality. No abdominopelvic ascites. No pneumoperitoneum. Musculoskeletal: No acute or significant osseous findings. Review of the MIP images confirms the above findings. IMPRESSION: Chest: 1. No evidence of pulmonary embolism. No acute intrathoracic process. 2. Unchanged mild aneurysmal dilatation of the mid ascending thoracic aorta measuring up to 4.0 cm. Recommend annual imaging followup by CTA or MRA. This recommendation follows 2010 ACCF/AHA/AATS/ACR/ASA/SCA/SCAI/SIR/STS/SVM Guidelines for the Diagnosis and Management of Patients with Thoracic Aortic Disease. Circulation. 2010; 121: L937-T024. Aortic aneurysm NOS (ICD10-I71.9) 3.  Aortic atherosclerosis (ICD10-I70.0). Abdomen and pelvis: 1. No acute intra-abdominal process. 2. Unchanged cholelithiasis. Electronically Signed   By: Titus Dubin M.D.   On: 11/21/2020 13:07   MR BRAIN WO CONTRAST  Result Date: 11/21/2020 CLINICAL DATA:  Dementia.  Recent worsening.  Weakness. EXAM: MRI HEAD WITHOUT CONTRAST TECHNIQUE: Multiplanar, multiecho pulse sequences of the brain and surrounding structures  were obtained  without intravenous contrast. COMPARISON:  Head CT yesterday. FINDINGS: Brain: Diffusion imaging does not show any acute or subacute infarction. Chronic small-vessel ischemic changes affect the pons. Few old small vessel cerebellar infarctions. Cerebral hemispheres show generalized atrophy, possibly with temporal lobe predominance. There is extensive chronic small-vessel ischemic change of the hemispheric white matter. 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 No evidence of mass lesion, hemorrhage, hydrocephalus or extra-axial collection. Vascular: Major vessels at the base of the brain show flow. Ectasia of the basilar tip as seen previously with maximal diameter 6.4 mm. Skull and upper cervical spine: Negative Sinuses/Orbits: Rhinosinusitis of the right maxillary sinus. Orbits negative. Other: None  IMPRESSION:  1. No acute or reversible finding. Generalized atrophy, possibly with some temporal lobe predominance. Extensive chronic small-vessel ischemic changes throughout the brain. 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  2. Ectasia of the basilar tip as seen previously with maximal diameter 6.4 mm. Electronically Signed   By: Nelson Chimes M.D.   On: 11/21/2020 19:15   MR LUMBAR SPINE WO CONTRAST  Result Date: 11/21/2020 CLINICAL DATA:  Increasing leg weakness developing over the past 3-4 weeks. EXAM: MRI LUMBAR SPINE WITHOUT CONTRAST TECHNIQUE: Multiplanar, multisequence MR imaging of the lumbar spine was performed. No intravenous contrast was administered. COMPARISON:  MRI lumbar spine 06/17/2012. FINDINGS: Segmentation:  Standard. Alignment:  No listhesis. Vertebrae: No fracture, evidence of discitis, or bone lesion. Degenerative endplate signal change U9-3 and L5-S1 has progressed since the prior exam. Conus medullaris and cauda equina: Conus extends to the L1 level. Conus and cauda equina appear normal. Paraspinal and other soft tissues: T2 hyperintense lesion in the right kidney is most consistent with  a cyst. Disc levels: T12-L1: Negative. L1-2: Negative. L2-3: Negative. L3-4: Shallow disc bulge and mild facet degenerative change. No stenosis. L4-5: There has been progression of spondylosis at this level. Loss of disc space height with a disc bulge, ligamentum flavum thickening and moderate facet arthropathy are seen. There is moderate central canal stenosis and mild narrowing in the right subarticular recess. Mild foraminal narrowing is more notable on the right. L5-S1: There has been some progression of disease at this level. Loss of disc space height with a bulge eccentric to the left. The central canal is open. Mild to moderate bilateral foraminal narrowing.  IMPRESSION: Some progression of spondylosis at L4-5 where there is moderate central canal stenosis and mild narrowing in the right subarticular recess. Mild bilateral foraminal narrowing is more notable on the right at this level. Some progression of spondylosis at L5-S1 where there is mild to moderate bilateral foraminal narrowing. The central canal is patent. Electronically Signed   By: Inge Rise M.D.   On: 11/21/2020 19:20   CT ABDOMEN PELVIS W CONTRAST  Result Date: 11/21/2020 CLINICAL DATA:  Chest and left flank pain. Hypoxia and altered mental status. EXAM: CT ANGIOGRAPHY CHEST CT ABDOMEN AND PELVIS WITH CONTRAST TECHNIQUE: Multidetector CT imaging of the chest was performed using the standard protocol during bolus administration of intravenous contrast. Multiplanar CT image reconstructions and MIPs were obtained to evaluate the vascular anatomy. Multidetector CT imaging of the abdomen and pelvis was performed using the standard protocol during bolus administration of intravenous contrast. CONTRAST:  126mL OMNIPAQUE IOHEXOL 350 MG/ML SOLN COMPARISON:  CT a chest, abdomen, and pelvis dated November 13, 2020. FINDINGS: CTA CHEST FINDINGS Cardiovascular: Satisfactory opacification of the pulmonary arteries to the segmental level. No  evidence of pulmonary embolism. Stable cardiomegaly status post TAVR. No pericardial effusion. Unchanged  mild aneurysmal dilatation of the mid ascending thoracic aorta measuring up to 4.0 cm. Coronary, aortic arch, and branch vessel atherosclerotic vascular disease. Unchanged left chest wall pacemaker. Mediastinum/Nodes: No enlarged mediastinal, hilar, or axillary lymph nodes. Unchanged 2.0 cm partially calcified hypodense nodule in the right thyroid lobe This has been evaluated on previous imaging. The trachea and esophagus demonstrate no significant findings. Lungs/Pleura: Minimal dependent subsegmental atelectasis in both lower lobes. No focal consolidation, pleural effusion, or pneumothorax. No suspicious pulmonary nodule. Biapical pleuroparenchymal scarring, greater on the right. Musculoskeletal: No chest wall abnormality. No acute or significant osseous findings. Review of the MIP images confirms the above findings. CT ABDOMEN AND PELVIS FINDINGS Hepatobiliary: No focal liver abnormality. Unchanged gallstone. No gallbladder wall thickening or biliary dilatation. Probable adenomyomatosis at the gallbladder fundus. Pancreas: Unremarkable. No pancreatic ductal dilatation or surrounding inflammatory changes. Spleen: Normal in size without focal abnormality. Adrenals/Urinary Tract: Adrenal glands are unremarkable. Unchanged bilateral renal cysts. No renal calculi or hydronephrosis. The bladder is decompressed by Foley catheter. Stomach/Bowel: Stomach is within normal limits. Appendix appears normal. No evidence of bowel wall thickening, distention, or inflammatory changes. Redundant sigmoid colon. Vascular/Lymphatic: Aortic atherosclerosis. No enlarged abdominal or pelvic lymph nodes. Reproductive: Unchanged mild prostatomegaly. Other: No abdominal wall hernia or abnormality. No abdominopelvic ascites. No pneumoperitoneum. Musculoskeletal: No acute or significant osseous findings. Review of the MIP images confirms  the above findings. IMPRESSION: Chest: 1. No evidence of pulmonary embolism. No acute intrathoracic process. 2. Unchanged mild aneurysmal dilatation of the mid ascending thoracic aorta measuring up to 4.0 cm. Recommend annual imaging followup by CTA or MRA. This recommendation follows 2010 ACCF/AHA/AATS/ACR/ASA/SCA/SCAI/SIR/STS/SVM Guidelines for the Diagnosis and Management of Patients with Thoracic Aortic Disease. Circulation. 2010; 121: V374-M270. Aortic aneurysm NOS (ICD10-I71.9) 3.  Aortic atherosclerosis (ICD10-I70.0). Abdomen and pelvis: 1. No acute intra-abdominal process. 2. Unchanged cholelithiasis. Electronically Signed   By: Titus Dubin M.D.   On: 11/21/2020 13:07   DG Chest Portable 1 View  Result Date: 11/21/2020 CLINICAL DATA:  87 year old male with history of altered mental status. Weakness. EXAM: PORTABLE CHEST 1 VIEW COMPARISON:  Chest x-ray 11/13/2020. FINDINGS: Lung volumes are normal. No consolidative airspace disease. No pleural effusions. No suspicious appearing pulmonary nodules or masses are noted. No pneumothorax. No evidence of pulmonary edema. Mild cardiomegaly. Upper mediastinal contours are within normal limits. Atherosclerotic calcifications in the thoracic aorta. Status post TAVR. Left-sided pacemaker device in place with lead tips projecting over the expected location of the right atrium and right ventricle. IMPRESSION: 1. No radiographic evidence of acute cardiopulmonary disease. 2. Mild cardiomegaly. 3. Aortic atherosclerosis. Electronically Signed   By: Vinnie Langton M.D.   On: 11/21/2020 10:54   ECHOCARDIOGRAM COMPLETE  Result Date: 11/07/2020     IMPRESSIONS  1. Left ventricular ejection fraction, by estimation, is 55 to 60%. The left ventricle has normal function. The left ventricle has no regional wall motion abnormalities. There is mild concentric left ventricular hypertrophy. Left ventricular diastolic parameters are indeterminate.  2. Right ventricular  systolic function is mildly reduced. The right ventricular size is severely enlarged.   3. Left atrial size was severely dilated.   4. Right atrial size was severely dilated.  5. The mitral valve is degenerative. Mild to moderate mitral valve regurgitation. Severe mitral annular calcification.  6. Tricuspid valve regurgitation is moderate, eccentric and appears to have two jets. Eccentricity may underestimate severity.   7. The aortic valve has been repaired/replaced. Aortic valve regurgitation is mild to moderate. Procedure Date:  05/30/2018. Aortic valve mean gradient measures 16.8 mmHg.   CT Angio Chest/Abd/Pel for Dissection W and/or W/WO Result Date: 11/13/2020    IMPRESSION: Vascular  1. No evidence of acute aortic syndrome.  2. Stable dilatation of the ascending thoracic aorta to 4 cm. Recommend annual imaging followup by CTA or MRA. This recommendation follows 2010 ACCF/AHA/AATS/ACR/ASA/SCA/SCAI/SIR/STS/SVM Guidelines for the Diagnosis and Management of Patients with Thoracic Aortic Disease. Circulation. 2010; 121: Y774-J287. Aortic aneurysm NOS (ICD10-I71.9) 3. No evidence of pulmonary embolism. 4. Cardiomegaly with right atrioventricular enlargement. Reflux of contrast into the hepatic veins and IVC, suggestive of right heart dysfunction. Central pulmonary artery enlargement may reflect further elevation of the right sided pressures or chronic pulmonary artery hypertension. 5.  Aortic Atherosclerosis (ICD10-I70.0). 6. Mild to moderate ostial narrowing of the inferior mesenteric artery. Mild ostial narrowing of the bilateral renal arteries and SMA.  7. Mild ectasia of the left internal iliac, unchanged from prior Nonvascular  1. Foley catheter in place. Mild bladder wall thickening with perivesicular hazy stranding, correlate with urinalysis to exclude cystitis. No obstructive urolithiasis or hydronephrosis is seen.  2. Cholelithiasis with mild gallbladder wall thickening versus adenomyomatosis  at the gallbladder fundus. Correlate with right upper quadrant symptoms and consider further evaluation with right upper quadrant ultrasound.  3. Stable appearance of a 2 cm hypoattenuating the partially calcified right posterior thyroid nodule. This has been evaluated on previous imaging. (ref: J Am Coll Radiol. 2015 Feb;12(2): 143-50). 4. Stable regions of subpleural scarring and architectural distortion in the right upper lobe. Electronically Signed   By: Lovena Le M.D.   On: 11/13/2020 22:49   US Abdomen Limited RUQ (LIVER/GB)  Result Date: 11/14/2020 CLINICAL DATA:  Pain EXAM: ULTRASOUND ABDOMEN LIMITED RIGHT UPPER QUADRANT COMPARISON:  11/13/2020 FINDINGS: Gallbladder: The sonographic Percell Miller sign is negative. There are gallstones without significant gallbladder wall thickening or pericholecystic free fluid. Common bile duct: Diameter: 5 mm Liver: No focal lesion identified. Within normal limits in parenchymal echogenicity. Portal vein is patent on color Doppler imaging with normal direction of blood flow towards the liver. Other: None. IMPRESSION: There is cholelithiasis without secondary signs of acute cholecystitis. Electronically Signed   By: Constance Holster M.D.   On: 11/14/2020 00:08    SIGNED: Deatra James, MD, FHM. [facility],  Pager (please use [email or web address] to page/text) Please use Epic Secure Chat for non-urgent communication (7AM-7PM)  If 7PM-7AM, please contact night-coverage [email or web address], 11/22/2020, 10:19 AM

## 2020-11-22 NOTE — Evaluation (Signed)
Physical Therapy Evaluation Patient Details Name: Jeffery Rogers MRN: [id number] DOB: 02-04-1932 Today's Date: 11/22/2020   History of Present Illness  Jeffery Rogers is a 87 y.o. male with medical history significant of complete heart block s/p pacer, a fib, HLD, dementia presents with family c/o steady decline and mentation and function since January.  Clinical Impression  Pt admitted with above diagnosis. Pt's spouse provides PLOF, states ~1 month ago pt walking 1 mile daily for exercise without AD, but over the past weeks has required more assist, using rollator walker and was receiving HHPT prior to hospitalization. Pt currently requiring max A to sit EOB and min A for transfers with 2 person present to steady. Trialed RW, pt appears unsteady and unsure of RW so additional transfers used bil HHA for improved safety. Pt lethargic during eval, able to open eyes on command 50% of the time, but does maintain eyes opened once sitting up in recliner and appears more alert at end of eval. Pt tolerates remaining up in recliner with spouse in room- RN notified. Pt currently with functional limitations due to the deficits listed below (see PT Problem List). Pt will benefit from skilled PT to increase their independence and safety with mobility to allow discharge to the venue listed below.       Follow Up Recommendations SNF    Equipment Recommendations  None recommended by PT    Recommendations for Other Services       Precautions / Restrictions Precautions Precautions: Fall Restrictions Weight Bearing Restrictions: No      Mobility  Bed Mobility Overal bed mobility: Needs Assistance Bed Mobility: Supine to Sit  Supine to sit: Max assist;HOB elevated  General bed mobility comments: max A to upright trunk and slide BLE over to EOB    Transfers Overall transfer level: Needs assistance Equipment used: Rolling walker (2 wheeled);2 person hand held assist Transfers: Sit to/from  [facility] Sit to Stand: Min assist;+2 physical assistance Stand pivot transfers: Min assist;+2 physical assistance  General transfer comment: min A +2 with RW and with 2 person HHA to steady. Initial STS and pivot with RW, pt requires assist to maintain grip on RW to rise to standing, then pushes RW away while pivoting over to [facility] 2nd STS and pivot with bil HHA, pt able to rise and steadied over to chair with therapist's managing lines for safety  Ambulation/Gait Ambulation/Gait assistance: Min assist Gait Distance (Feet): 4 Feet Assistive device: 2 person hand held assist Gait Pattern/deviations: Step-to pattern;Decreased stride length;Trunk flexed Gait velocity: decreased   General Gait Details: short, shuffling steps with bil knees and trunk flexed forward, unsteady requiring HHA to steady with limited steps in room  Stairs            Wheelchair Mobility    Modified Rankin (Stroke Patients Only)       Balance Overall balance assessment: Needs assistance Sitting-balance support: Feet supported;Bilateral upper extremity supported Sitting balance-Leahy Scale: Fair Sitting balance - Comments: seated EOB, close SUPV   Standing balance support: During functional activity;Bilateral upper extremity supported Standing balance-Leahy Scale: Poor Standing balance comment: reliant on UE support            Pertinent Vitals/Pain Pain Assessment: Faces Faces Pain Scale: Hurts a little bit Pain Location: low back in sitting Pain Descriptors / Indicators: Aching;Sore Pain Intervention(s): Limited activity within patient's tolerance;Monitored during session;Repositioned    Home Living Family/patient expects to be discharged to:: Skilled nursing facility Living Arrangements: Spouse/significant  other   Type of Home: House Home Access: Ramped entrance     Home Layout: Two level;Able to live on main level with bedroom/bathroom Home Equipment: Walker - 4  wheels      Prior Function Level of Independence: Needs assistance   Gait / Transfers Assistance Needed: Spouse reports pt currenly requiring assist to ambulate limited household distances with rollator walker.  ADL's / Homemaking Assistance Needed: Spouse reports pt currenly requiring assist with ADLs.  Comments: Spouse reports pt was walking 1 mile without AD at end of January, steady decline since then requiring assist with ADLs, ambulation and HHPT.     Hand Dominance   Dominant Hand: Right    Extremity/Trunk Assessment   Upper Extremity Assessment Upper Extremity Assessment: Defer to OT evaluation    Lower Extremity Assessment Lower Extremity Assessment: Generalized weakness (AROM WNL, functional strength 3+/5 throughout)    Cervical / Trunk Assessment Cervical / Trunk Assessment: Normal  Communication   Communication: No difficulties  Cognition Arousal/Alertness: Lethargic Behavior During Therapy: Flat affect Overall Cognitive Status: Impaired/Different from baseline  General Comments: Spouse in room, reports pt has been flat and eyes closed over the past couple of weeks. Pt requiring increased time and cues with mobility, opens eyes 50% of the time to commands.      General Comments      Exercises     Assessment/Plan    PT Assessment Patient needs continued PT services  PT Problem List Decreased strength;Decreased activity tolerance;Decreased balance;Decreased mobility;Decreased cognition;Decreased knowledge of use of DME;Decreased safety awareness       PT Treatment Interventions DME instruction;Gait training;Functional mobility training;Therapeutic activities;Therapeutic exercise;Balance training;Neuromuscular re-education;Patient/family education    PT Goals (Current goals can be found in the Care Plan section)  Acute Rehab PT Goals Patient Stated Goal: spouse states skilled facility for therapy before returning home PT Goal Formulation: With  family Time For Goal Achievement: 12/06/20 Potential to Achieve Goals: Good    Frequency Min 2X/week   Barriers to discharge        Co-evaluation PT/OT/SLP Co-Evaluation/Treatment: Yes Reason for Co-Treatment: Necessary to address cognition/behavior during functional activity;For patient/therapist safety;To address functional/ADL transfers PT goals addressed during session: Mobility/safety with mobility;Balance;Proper use of DME         AM-PAC PT "6 Clicks" Mobility  Outcome Measure Help needed turning from your back to your side while in a flat bed without using bedrails?: A Lot Help needed moving from lying on your back to sitting on the side of a flat bed without using bedrails?: A Lot Help needed moving to and from a bed to a chair (including a wheelchair)?: A Little Help needed standing up from a chair using your arms (e.g., wheelchair or bedside chair)?: A Little Help needed to walk in hospital room?: A Lot Help needed climbing 3-5 steps with a railing? : Total 6 Click Score: 13    End of Session Equipment Utilized During Treatment: Gait belt Activity Tolerance: Patient tolerated treatment well;Patient limited by lethargy Patient left: in chair;with call bell/phone within reach;with chair alarm set;with family/visitor present Nurse Communication: Mobility status PT Visit Diagnosis: Unsteadiness on feet (R26.81);Other abnormalities of gait and mobility (R26.89);Muscle weakness (generalized) (M62.81)    Time: 8182-9937 PT Time Calculation (min) (ACUTE ONLY): 32 min   Charges:   PT Evaluation $PT Eval Moderate Complexity: 1 Mod           Tori Denney Shein PT, DPT 11/22/20, 12:11 PM

## 2020-11-23 DIAGNOSIS — Z978 Presence of other specified devices: Secondary | ICD-10-CM

## 2020-11-23 DIAGNOSIS — N390 Urinary tract infection, site not specified: Secondary | ICD-10-CM | POA: Diagnosis not present

## 2020-11-23 LAB — URINE CULTURE: Culture: NO GROWTH

## 2020-11-23 MED ORDER — HALOPERIDOL LACTATE 5 MG/ML IJ SOLN
2.0000 mg | Freq: Four times a day (QID) | INTRAMUSCULAR | Status: DC | PRN
  Administered 2020-11-24: 2 mg via INTRAVENOUS
  Filled 2020-11-23 (×2): qty 1

## 2020-11-23 MED ORDER — AMLODIPINE BESYLATE 5 MG PO TABS
5.0000 mg | ORAL_TABLET | Freq: Every day | ORAL | Status: DC
  Administered 2020-11-24 – 2020-11-25 (×2): 5 mg via ORAL
  Filled 2020-11-23 (×2): qty 1

## 2020-11-23 MED ORDER — TRAZODONE HCL 50 MG PO TABS
50.0000 mg | ORAL_TABLET | Freq: Every day | ORAL | Status: DC
  Administered 2020-11-23 – 2020-11-25 (×3): 50 mg via ORAL
  Filled 2020-11-23 (×3): qty 1

## 2020-11-23 NOTE — Progress Notes (Signed)
Pt  restless, trying to get oob bed, pulled out his IV. Increase fall risk, safety mat placed on fall, redirection unsuccessful. Paged NP [place] order received  for safety restraints.

## 2020-11-23 NOTE — Progress Notes (Signed)
PROGRESS NOTE    Patient: Jeffery Rogers                            PCP: Jeffery Pretty, MD                    DOB: 11-12-1931            DOA: 11/21/2020 BTD:[id number]             DOS: 11/23/2020, 11:12 AM   LOS: 2 days   Date of Service: The patient was seen and examined on 11/23/2020  Subjective:   The patient was seen and examined this morning per nursing staff was agitated overnight. Responded to medication Remained to be very confused. Currently comfortable laying in bed, responds to pain stimuli Wife present at bedside  Foley catheter still in place frank hematuria noted Hemodynamic stable, mildly hypertensive blood pressure 161/58  Brief Narrative:   Jeffery Rogers is a 87 y.o. male with medical history significant of complete heart block s/p pacer, a fib, HLD, dementia. History is from son at bedside as patient has dementia and is a poor historian. His son reports that at the beginning of January, the patient was able to perform all his ADLs and walk on his own. Over the last three weeks or so, he's had a steady decline and mentation and function. The son has noticed that he has increased weakness in his legs. He is now unable to walk on his own. He is unable to perform his ADLs. He is more confused and less interactive. He has been to his neurologist who did a [place]. It was negative for acute stroke. He has been seen by urology for UTI and the ED for abdominal pain. He was started on abx for a presumed UTI. However, his overall functional status has not improved. His son felt it was time to return to the ED for assistance.   ED Course: No acute findings on CTA PE or CT ab/pelvis. He had a brief episode of hypoxia that has since resolved. There was concern for stroke. The Jeffery Rogers spoke with neurology. Recommended MRI. Jeffery Rogers was called for admission.   Assessment & Plan:   Principal Problem:   AMS (altered mental status) Active Problems:   Hematuria   Acute lower UTI   Essential  tremor   Elevated CPK   S/P TAVR (transcatheter aortic valve replacement)   Persistent atrial fibrillation (HCC)   Memory loss   Dementia with behavioral disturbance (HCC)   Late onset Alzheimer's disease with behavioral disturbance (HCC)   Confusion   Chronic indwelling Foley catheter   Altered mental status / Hx of dementia -with behavior changes agitation, confusion     -Remains somnolent this morning withdraws to pain stimuli  -Agitation overnight -as needed Jeffery Rogers, Jeffery Rogers was added      -Family concurs that he is declining mentally and physically since his hospitalization this January     -On admission he is A&O x 1 (which is his baseline according to his son);      -This morning he is not following any commands, wife states he was agitated earlier this morning     -Family reports he does well with his  Jeffery Rogers --was resumed     -Patient has been seen and following neurology regarding continued mental decline, poor cognition   -MRI of the brain: Reviewed -no acute changes or infarct old occipital  infarct, significant microvascular changes, atrophy  -MRI spine reviewed -  L4-5 where there is moderate central canal stenosis and mild narrowing in the right subarticular recess. Mild bilateral foraminal narrowing       -Due to multiple comorbidities, and progressive mental and physical decline prognosis remains              Poor --his wife and son is aware, agreed with Jeffery Rogers care consultation  -Confirmed DNR/DNI status, no heroic measures to be taken     - could be a progression of his previous dementia or a slow recovery from the insult of infection     - apparently his dementia meds were held recently by neurology  UTI/frank acute hematuria /chronic urinary retention -indwelling catheter since January 22 -No improvement continue to have hematuria, Foley in place     -Patient's son reports that previous cultures in urology office was positive for morganella     - Jeffery Rogers spoke  with urology; they stated it was reasonable to switch to IV abx to help and re-culture  -He has been restarted on IV antibiotics of Jeffery Rogers      - per Jeffery Rogers, urology not planning acute intervention on hematuria     - continue Jeffery Rogers, check urine culture     - maintain foley     -Last hemoglobin 13.3, would not pursue any further checks,  -Continue to hold his Jeffery Rogers -due to ongoing hematuria  Severe progressive generalized weakness, BLE weakness/numbness Dysphagia?    -He is not very cooperative in exam, somnolent, withdrawn, minimally verbal and physical interactive -We will monitor closely, avoiding aspiration     - CTH negative for acute stroke on 2/17     - spoke with neurology PA present at bedside, Re: Repeat MRI; believes MRI     -MRI T-spine, reviewed, consistent with chronic changes     -Consulting Jeffery Rogers eval >> 1 awake speech recommended dysphagia 1, 10 liquid soft solids finger     - Jeffery Rogers >> progressive generalized weakness recommending SNF  Afib     - hold Jeffery Rogers d/t hematuria     -Monitoring heart rate closely  -Monitoring hematuria, monitoring  Failure to thrive /progressive decline     - all of this could be a progression of his dementia     - Jeffery Rogers care consult --we are recommending comfort care measures, if no improvement likely hospice home  HTN     -Blood pressure elevated this morning 161/58, as needed hydralazine,  -P.o. Jeffery Rogers if he could tolerate  -We will hold home medications due to poor p.o. intake,   Elevated troponin     - trp flat, EKG w/ jxnal rhythm, denies CP; follow symptoms  -Comfortable at this time, in no distress, no signs of chest pain  Elevated CPK -546     -Continue gentle IV fluid, will monitor  Severe debility continue decline mentally and physically Prognosis very poor due to multiple comorbidities, dementia and decline mental and physical status Goal of care -discussed with patient is wife and son at  bedside Jeffery Rogers care consulted -we are recommending comfort care measures,.. If continued decline possibly hospice home ... Family is agreeable to the plan, agreed to no heroic measures Possibly to treat reversible causes.   DVT prophylaxis: SCDs  Code Status: DNR/DNI Family Communication:  With patient's son and wife at bedside-discussed in detail Consults called: Neurology   Status is: Inpatient  Remains inpatient appropriate because:Inpatient level of care appropriate due to severity  of illness   Dispo: The patient is from: Home  Anticipated d/c is to: SNF --- Jeffery Rogers, possibly hospice  Anticipated d/c date is: 3 days  Patient currently is not medically stable to d/c.              Difficult to place patient No   ----------------------------------------------------------------------------------------------------------------------------------- Nutritional status:  The patient's BMI is: Body mass index is 23.33 kg/m. I agree with the assessment and plan as outlined .Marland Kitchen    ------------------------------------------------------------------------------------------------------------------------------------ Cultures; Blood Cultures x 2 >> Urine Culture  >>>   Antimicrobials: IV Jeffery Rogers   Consultants: Jeffery Rogers care team/neurology   ------------------------------------------------------------------------------------------------------------------------------------  DVT prophylaxis:  SCD/Compression stockings Code Status:   Code Status: Prior  Family Communication:  Patient's son and wife present at bedside The above findings and plan of care has been discussed with his son, wife in detail,  they expressed understanding and agreement no heroic measures to be taken, any reversible issues to be addressed otherwise as patient declines agreed with comfort care measures.  -Advance care planning has been discussed.   Admission  status:   Status is: Inpatient  Remains inpatient appropriate because:Hemodynamically unstable and Inpatient level of care appropriate due to severity of illness   Dispo: The patient is from: Home              Anticipated d/c is to: To be determined ?  SNF with hospice              Anticipated d/c date is: 3 days              Patient currently is not medically stable to d/c.   Difficult to place patient No    Level of care: Telemetry   Procedures:   No admission procedures for hospital encounter.     Antimicrobials:  Anti-infectives (From admission, onward)   Start     Dose/Rate Route Frequency Ordered Stop   11/22/20 1400  cefTRIAXone (Jeffery Rogers) 2 g in sodium chloride 0.9 % 100 mL IVPB        2 g 200 mL/hr over 30 Minutes Intravenous Every 24 hours 11/21/20 2058     11/21/20 1415  cefTRIAXone (Jeffery Rogers) 1 g in sodium chloride 0.9 % 100 mL IVPB        1 g 200 mL/hr over 30 Minutes Intravenous  Once 11/21/20 1413 11/21/20 1614       Medication:  .  stroke: mapping our early stages of recovery book   Does not apply Once  . ALPRAZolam  0.25 mg Oral BID  . amLODipine  2.5 mg Oral Daily  . Chlorhexidine Gluconate Cloth  6 each Topical Daily  . glycopyrrolate  1 mg Oral TID  . traZODone  50 mg Oral QHS    acetaminophen **OR** acetaminophen (TYLENOL) oral liquid 160 mg/5 mL **OR** acetaminophen, haloperidol lactate, Jeffery Rogers injection, polyvinyl alcohol, senna-docusate   Objective:   Vitals:   11/22/20 1046 11/22/20 1259 11/22/20 2100 11/23/20 0635  BP: (!) 130/104 (!) 157/76 108/69 (!) 161/58  Pulse: (!) 59 60 66 60  Resp: 18 18    Temp: 98.1 F (36.7 C) 99 F (37.2 C) 98 F (36.7 C) 98 F (36.7 C)  TempSrc: Oral Oral Oral Oral  SpO2: 97% 100% 100% 99%  Height:        Intake/Output Summary (Last 24 hours) at 11/23/2020 1112 Last data filed at 11/23/2020 1034 Gross per 24 hour  Intake 766.13 ml  Output 1350 ml  Net -583.87 ml  There were no vitals  filed for this visit.   Examination:      Physical Exam:   General:   Confused, responds to pain stimuli...  comfortable in bed  HEENT:  Normocephalic, PERRL, otherwise with in Normal limits   Neuro:   Limited exam, -patient sleepy, withdraws to pain stimuli  Lungs:   Clear to auscultation BL, Respirations unlabored, no wheezes / crackles  Cardio:    S1/S2, RRR, No murmure, No Rubs or Gallops   Abdomen:   Soft, non-tender, bowel sounds active all four quadrants,  no guarding or peritoneal signs.  Muscular skeletal:   Severe generalized weaknesses ... Limited exam - in bed, able to move all 4 extremities, Normal strength,  2+ pulses,  symmetric, No pitting edema  Skin:  Dry, warm to touch, negative for any Rashes,  Wounds: Please see nursing documentation Foley catheter placed, frank hematuria noted         ------------------------------------------------------------------------------------------------------------------------------------------    LABs:  CBC Latest Ref Rng & Units 11/22/2020 11/21/2020 11/20/2020  WBC 4.0 - 10.5 K/uL 7.7 8.0 9.3  Hemoglobin 13.0 - 17.0 g/dL 13.3 15.7 14.9  Hematocrit 39.0 - 52.0 % 40.7 48.4 43.5  Platelets 150 - 400 K/uL 140(L) 151 168   CMP Latest Ref Rng & Units 11/22/2020 11/21/2020 11/20/2020  Glucose 70 - 99 mg/dL 84 71 83  BUN 8 - 23 mg/dL 19 24(H) 23  Creatinine 0.61 - 1.24 mg/dL 1.06 1.24 1.26  Sodium 135 - 145 mmol/L 136 134(L) 135  Potassium 3.5 - 5.1 mmol/L 4.5 4.3 4.7  Chloride 98 - 111 mmol/L 103 100 98  CO2 22 - 32 mmol/L 24 23 18(L)  Calcium 8.9 - 10.3 mg/dL 8.8(L) 9.3 9.5  Total Protein 6.5 - 8.1 g/dL 6.2(L) 7.5 6.9  Total Bilirubin 0.3 - 1.2 mg/dL 0.9 0.9 0.6  Alkaline Phos 38 - 126 U/L 83 105 133(H)  AST 15 - 41 U/L 55(H) 60(H) 52(H)  ALT 0 - 44 U/L 40 48(H) 40       Micro Results Recent Results (from the past 240 hour(s))  Resp Panel by RT-PCR (Flu A&B, Covid) Nasopharyngeal Swab     Status: None   Collection Time:  11/21/20 10:34 AM   Specimen: Nasopharyngeal Swab; Nasopharyngeal(NP) swabs in vial transport medium  Result Value Ref Range Status   SARS Coronavirus 2 by RT PCR NEGATIVE NEGATIVE Final    Comment: (NOTE) SARS-CoV-2 target nucleic acids are NOT DETECTED.  The SARS-CoV-2 RNA is generally detectable in upper respiratory specimens during the acute phase of infection. The lowest concentration of SARS-CoV-2 viral copies this assay can detect is 138 copies/mL. A negative result does not preclude SARS-Cov-2 infection and should not be used as the sole basis for treatment or other patient management decisions. A negative result may occur with  improper specimen collection/handling, submission of specimen other than nasopharyngeal swab, presence of viral mutation(s) within the areas targeted by this assay, and inadequate number of viral copies(<138 copies/mL). A negative result must be combined with clinical observations, patient history, and epidemiological information. The expected result is Negative.  Fact Sheet for Patients:  EntrepreneurPulse.com.au  Fact Sheet for Healthcare Providers:  IncredibleEmployment.be  This test is no t yet approved or cleared by the [place] FDA and  has been authorized for detection and/or diagnosis of SARS-CoV-2 by FDA under an Emergency Use Authorization (EUA). This EUA will remain  in effect (meaning this test can be used) for the duration of the  COVID-19 declaration under Section 564(b)(1) of the Act, 21 U.S.C.section 360bbb-3(b)(1), unless the authorization is terminated  or revoked sooner.       Influenza A by PCR NEGATIVE NEGATIVE Final   Influenza B by PCR NEGATIVE NEGATIVE Final    Comment: (NOTE) The Xpert Xpress SARS-CoV-2/FLU/RSV plus assay is intended as an aid in the diagnosis of influenza from Nasopharyngeal swab specimens and should not be used as a sole basis for treatment. Nasal washings  and aspirates are unacceptable for Xpert Xpress SARS-CoV-2/FLU/RSV testing.  Fact Sheet for Patients: EntrepreneurPulse.com.au  Fact Sheet for Healthcare Providers: IncredibleEmployment.be  This test is not yet approved or cleared by the [place] FDA and has been authorized for detection and/or diagnosis of SARS-CoV-2 by FDA under an Emergency Use Authorization (EUA). This EUA will remain in effect (meaning this test can be used) for the duration of the COVID-19 declaration under Section 564(b)(1) of the Act, 21 U.S.C. section 360bbb-3(b)(1), unless the authorization is terminated or revoked.  Performed at [facility], [place] [address], [place], [place] [address]   Culture, blood (routine x 2)     Status: None (Preliminary result)   Collection Time: 11/21/20 11:24 AM   Specimen: BLOOD RIGHT ARM  Result Value Ref Range Status   Specimen Description   Final    BLOOD RIGHT ARM Performed at [facility] [address] [place], [place] [address]    Special Requests   Final    BOTTLES DRAWN AEROBIC AND ANAEROBIC Blood Culture adequate volume Performed at [facility] [address] [place], [place] [address]    Culture   Final    NO GROWTH 2 DAYS Performed at [facility] [address] [place], [place] [address]    Report Status PENDING  Incomplete  Culture, blood (routine x 2)     Status: None (Preliminary result)   Collection Time: 11/21/20  9:30 PM   Specimen: BLOOD  Result Value Ref Range Status   Specimen Description   Final    BLOOD LEFT ANTECUBITAL Performed at [facility] [address] [place], [place] [address]    Special Requests   Final    BOTTLES DRAWN AEROBIC AND ANAEROBIC Blood Culture adequate volume Performed at [facility] [address] [place], [place] [address]    Culture   Final    NO GROWTH 1  DAY Performed at [facility], [place] [address], [place], [place] [address]    Report Status PENDING  Incomplete  Urine culture     Status: None   Collection Time: 11/22/20  7:40 AM   Specimen: Urine, Random  Result Value Ref Range Status   Specimen Description   Final    URINE, RANDOM Performed at [facility] [address] [place], [place] [address]    Special Requests   Final    NONE Performed at [facility], [place] [address] [place], [place] [address]    Culture   Final    NO GROWTH Performed at [facility], [place] [address] [place], [place] [address]    Report Status 11/23/2020 FINAL  Final  C Difficile Quick Screen w PCR reflex     Status: None   Collection Time: 11/22/20 10:10 AM   Specimen: STOOL  Result Value Ref Range Status   C Diff antigen NEGATIVE NEGATIVE Final   C Diff toxin NEGATIVE NEGATIVE Final  C Diff interpretation No C. difficile detected.  Final    Comment: Performed at [facility], [place] [address], [place], [place] [address]    Radiology Reports DG Chest 2 View  Result Date: 11/13/2020 CLINICAL DATA:  Right upper back pain. EXAM: CHEST - 2 VIEW COMPARISON:  Chest x-ray dated April 16, 2019. FINDINGS: Unchanged left chest wall pacemaker. Interval enlargement of the cardiopericardial silhouette with a globular appearance. Prior TAVR. Normal pulmonary vascularity. No focal consolidation, pleural effusion, or pneumothorax. No acute osseous abnormality. IMPRESSION: 1. Interval enlargement of the cardiopericardial silhouette with a globular appearance, raising concern for pericardial effusion. Electronically Signed   By: Titus Dubin M.D.   On: 11/13/2020 19:55   CT HEAD WO CONTRAST  Result Date: 11/21/2020 CLINICAL DATA:  87 year old male with progressive altered mental status, generalized weakness. This exam originally designated as [facility] to report, however, the [facility] Emergency Department contacted me by phone requesting urgent interpretation. EXAM: CT HEAD WITHOUT CONTRAST TECHNIQUE: Contiguous axial images were obtained from the base of the skull through the vertex without intravenous contrast. COMPARISON:  Head CT 01/14/2017. FINDINGS: Brain: Encephalomalacia at the left occipital pole is new since 2018 and associated with mild interval enlargement of the left occipital horn. Patchy bilateral cerebral white matter hypodensity has progressed since 2018, most notably the posterior limb right internal capsule on series 2, image 19. No midline shift, ventriculomegaly, mass effect, evidence of mass lesion, intracranial hemorrhage or evidence of cortically based acute infarction. Vascular: Calcified atherosclerosis at the skull base. Evidence of a small unruptured 6-7 mm basilar tip aneurysm or focal vessel ectasia (coronal image 38), probably not significantly changed since 2018. No suspicious intracranial vascular hyperdensity. Skull: No acute osseous abnormality identified. Sinuses/Orbits: Chronic right maxillary sinusitis with mucoperiosteal thickening and inspissated material. Other Visualized paranasal sinuses and mastoids are clear. Other: No acute orbit or scalp soft tissue finding identified. IMPRESSION: 1. No acute intracranial abnormality by CT. - small Left PCA infarct at the left occipital pole is new since 2018 but appears chronic. - Progressed cerebral white matter disease since 2018, most likely due to small vessel ischemia. 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 2. Chronic right maxillary sinusitis. The above was discussed by telephone on 11/21/2020 at 1019 hours with ED provider Georga Kaufmann. Electronically Signed   By: Genevie Ann M.D.   On: 11/21/2020 10:32   CT Angio Chest PE W and/or Wo Contrast  Result Date: 11/21/2020 CLINICAL DATA:  Chest and left flank pain. Hypoxia and altered mental status. EXAM: CT  ANGIOGRAPHY CHEST CT ABDOMEN AND PELVIS WITH CONTRAST TECHNIQUE: Multidetector CT imaging of the chest was performed using the standard protocol during bolus administration of intravenous contrast. Multiplanar CT image reconstructions and MIPs were obtained to evaluate the vascular anatomy. Multidetector CT imaging of the abdomen and pelvis was performed using the standard protocol during bolus administration of intravenous contrast. CONTRAST:  195mL OMNIPAQUE IOHEXOL 350 MG/ML SOLN COMPARISON:  CT a chest, abdomen, and pelvis dated November 13, 2020. FINDINGS: CTA CHEST FINDINGS Cardiovascular: Satisfactory opacification of the pulmonary arteries to the segmental level. No evidence of pulmonary embolism. Stable cardiomegaly status post TAVR. No pericardial effusion. Unchanged mild aneurysmal dilatation of the mid ascending thoracic aorta measuring up to 4.0 cm. Coronary, aortic arch, and branch vessel atherosclerotic vascular disease. Unchanged left chest wall pacemaker. Mediastinum/Nodes: No enlarged mediastinal, hilar, or axillary lymph nodes. Unchanged 2.0 cm  partially calcified hypodense nodule in the right thyroid lobe This has been evaluated on previous imaging. The trachea and esophagus demonstrate no significant findings. Lungs/Pleura: Minimal dependent subsegmental atelectasis in both lower lobes. No focal consolidation, pleural effusion, or pneumothorax. No suspicious pulmonary nodule. Biapical pleuroparenchymal scarring, greater on the right. Musculoskeletal: No chest wall abnormality. No acute or significant osseous findings. Review of the MIP images confirms the above findings. CT ABDOMEN AND PELVIS FINDINGS Hepatobiliary: No focal liver abnormality. Unchanged gallstone. No gallbladder wall thickening or biliary dilatation. Probable adenomyomatosis at the gallbladder fundus. Pancreas: Unremarkable. No pancreatic ductal dilatation or surrounding inflammatory changes. Spleen: Normal in size without focal  abnormality. Adrenals/Urinary Tract: Adrenal glands are unremarkable. Unchanged bilateral renal cysts. No renal calculi or hydronephrosis. The bladder is decompressed by Foley catheter. Stomach/Bowel: Stomach is within normal limits. Appendix appears normal. No evidence of bowel wall thickening, distention, or inflammatory changes. Redundant sigmoid colon. Vascular/Lymphatic: Aortic atherosclerosis. No enlarged abdominal or pelvic lymph nodes. Reproductive: Unchanged mild prostatomegaly. Other: No abdominal wall hernia or abnormality. No abdominopelvic ascites. No pneumoperitoneum. Musculoskeletal: No acute or significant osseous findings. Review of the MIP images confirms the above findings. IMPRESSION: Chest: 1. No evidence of pulmonary embolism. No acute intrathoracic process. 2. Unchanged mild aneurysmal dilatation of the mid ascending thoracic aorta measuring up to 4.0 cm. Recommend annual imaging followup by CTA or MRA. This recommendation follows 2010 ACCF/AHA/AATS/ACR/ASA/SCA/SCAI/SIR/STS/SVM Guidelines for the Diagnosis and Management of Patients with Thoracic Aortic Disease. Circulation. 2010; 121: E268-T419. Aortic aneurysm NOS (ICD10-I71.9) 3.  Aortic atherosclerosis (ICD10-I70.0). Abdomen and pelvis: 1. No acute intra-abdominal process. 2. Unchanged cholelithiasis. Electronically Signed   By: Titus Dubin M.D.   On: 11/21/2020 13:07   MR BRAIN WO CONTRAST  Result Date: 11/21/2020 CLINICAL DATA:  Dementia.  Recent worsening.  Weakness. EXAM: MRI HEAD WITHOUT CONTRAST TECHNIQUE: Multiplanar, multiecho pulse sequences of the brain and surrounding structures were obtained without intravenous contrast. COMPARISON:  Head CT yesterday. FINDINGS: Brain: Diffusion imaging does not show any acute or subacute infarction. Chronic small-vessel ischemic changes affect the pons. Few old small vessel cerebellar infarctions. Cerebral hemispheres show generalized atrophy, possibly with temporal lobe predominance.  There is extensive chronic small-vessel ischemic change of the hemispheric white matter. 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 No evidence of mass lesion, hemorrhage, hydrocephalus or extra-axial collection. Vascular: Major vessels at the base of the brain show flow. Ectasia of the basilar tip as seen previously with maximal diameter 6.4 mm. Skull and upper cervical spine: Negative Sinuses/Orbits: Rhinosinusitis of the right maxillary sinus. Orbits negative. Other: None  IMPRESSION:  1. No acute or reversible finding. Generalized atrophy, possibly with some temporal lobe predominance. Extensive chronic small-vessel ischemic changes throughout the brain. 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  2. Ectasia of the basilar tip as seen previously with maximal diameter 6.4 mm. Electronically Signed   By: Nelson Chimes M.D.   On: 11/21/2020 19:15   MR LUMBAR SPINE WO CONTRAST  Result Date: 11/21/2020 CLINICAL DATA:  Increasing leg weakness developing over the past 3-4 weeks. EXAM: MRI LUMBAR SPINE WITHOUT CONTRAST TECHNIQUE: Multiplanar, multisequence MR imaging of the lumbar spine was performed. No intravenous contrast was administered. COMPARISON:  MRI lumbar spine 06/17/2012. FINDINGS: Segmentation:  Standard. Alignment:  No listhesis. Vertebrae: No fracture, evidence of discitis, or bone lesion. Degenerative endplate signal change Q2-2 and L5-S1 has progressed since the prior exam. Conus medullaris and cauda equina: Conus extends to the L1 level. Conus and cauda equina appear normal. Paraspinal and other  soft tissues: T2 hyperintense lesion in the right kidney is most consistent with a cyst. Disc levels: T12-L1: Negative. L1-2: Negative. L2-3: Negative. L3-4: Shallow disc bulge and mild facet degenerative change. No stenosis. L4-5: There has been progression of spondylosis at this level. Loss of disc space height with a disc bulge, ligamentum flavum thickening and moderate facet arthropathy are seen. There is  moderate central canal stenosis and mild narrowing in the right subarticular recess. Mild foraminal narrowing is more notable on the right. L5-S1: There has been some progression of disease at this level. Loss of disc space height with a bulge eccentric to the left. The central canal is open. Mild to moderate bilateral foraminal narrowing.  IMPRESSION: Some progression of spondylosis at L4-5 where there is moderate central canal stenosis and mild narrowing in the right subarticular recess. Mild bilateral foraminal narrowing is more notable on the right at this level. Some progression of spondylosis at L5-S1 where there is mild to moderate bilateral foraminal narrowing. The central canal is patent. Electronically Signed   By: Inge Rise M.D.   On: 11/21/2020 19:20   CT ABDOMEN PELVIS W CONTRAST  Result Date: 11/21/2020 CLINICAL DATA:  Chest and left flank pain. Hypoxia and altered mental status. EXAM: CT ANGIOGRAPHY CHEST CT ABDOMEN AND PELVIS WITH CONTRAST TECHNIQUE: Multidetector CT imaging of the chest was performed using the standard protocol during bolus administration of intravenous contrast. Multiplanar CT image reconstructions and MIPs were obtained to evaluate the vascular anatomy. Multidetector CT imaging of the abdomen and pelvis was performed using the standard protocol during bolus administration of intravenous contrast. CONTRAST:  127mL OMNIPAQUE IOHEXOL 350 MG/ML SOLN COMPARISON:  CT a chest, abdomen, and pelvis dated November 13, 2020. FINDINGS: CTA CHEST FINDINGS Cardiovascular: Satisfactory opacification of the pulmonary arteries to the segmental level. No evidence of pulmonary embolism. Stable cardiomegaly status post TAVR. No pericardial effusion. Unchanged mild aneurysmal dilatation of the mid ascending thoracic aorta measuring up to 4.0 cm. Coronary, aortic arch, and branch vessel atherosclerotic vascular disease. Unchanged left chest wall pacemaker. Mediastinum/Nodes: No enlarged  mediastinal, hilar, or axillary lymph nodes. Unchanged 2.0 cm partially calcified hypodense nodule in the right thyroid lobe This has been evaluated on previous imaging. The trachea and esophagus demonstrate no significant findings. Lungs/Pleura: Minimal dependent subsegmental atelectasis in both lower lobes. No focal consolidation, pleural effusion, or pneumothorax. No suspicious pulmonary nodule. Biapical pleuroparenchymal scarring, greater on the right. Musculoskeletal: No chest wall abnormality. No acute or significant osseous findings. Review of the MIP images confirms the above findings. CT ABDOMEN AND PELVIS FINDINGS Hepatobiliary: No focal liver abnormality. Unchanged gallstone. No gallbladder wall thickening or biliary dilatation. Probable adenomyomatosis at the gallbladder fundus. Pancreas: Unremarkable. No pancreatic ductal dilatation or surrounding inflammatory changes. Spleen: Normal in size without focal abnormality. Adrenals/Urinary Tract: Adrenal glands are unremarkable. Unchanged bilateral renal cysts. No renal calculi or hydronephrosis. The bladder is decompressed by Foley catheter. Stomach/Bowel: Stomach is within normal limits. Appendix appears normal. No evidence of bowel wall thickening, distention, or inflammatory changes. Redundant sigmoid colon. Vascular/Lymphatic: Aortic atherosclerosis. No enlarged abdominal or pelvic lymph nodes. Reproductive: Unchanged mild prostatomegaly. Other: No abdominal wall hernia or abnormality. No abdominopelvic ascites. No pneumoperitoneum. Musculoskeletal: No acute or significant osseous findings. Review of the MIP images confirms the above findings. IMPRESSION: Chest: 1. No evidence of pulmonary embolism. No acute intrathoracic process. 2. Unchanged mild aneurysmal dilatation of the mid ascending thoracic aorta measuring up to 4.0 cm. Recommend annual imaging followup by CTA or  MRA. This recommendation follows 2010 ACCF/AHA/AATS/ACR/ASA/SCA/SCAI/SIR/STS/SVM  Guidelines for the Diagnosis and Management of Patients with Thoracic Aortic Disease. Circulation. 2010; 121: D532-D924. Aortic aneurysm NOS (ICD10-I71.9) 3.  Aortic atherosclerosis (ICD10-I70.0). Abdomen and pelvis: 1. No acute intra-abdominal process. 2. Unchanged cholelithiasis. Electronically Signed   By: Titus Dubin M.D.   On: 11/21/2020 13:07   DG Chest Portable 1 View  Result Date: 11/21/2020 CLINICAL DATA:  87 year old male with history of altered mental status. Weakness. EXAM: PORTABLE CHEST 1 VIEW COMPARISON:  Chest x-ray 11/13/2020. FINDINGS: Lung volumes are normal. No consolidative airspace disease. No pleural effusions. No suspicious appearing pulmonary nodules or masses are noted. No pneumothorax. No evidence of pulmonary edema. Mild cardiomegaly. Upper mediastinal contours are within normal limits. Atherosclerotic calcifications in the thoracic aorta. Status post TAVR. Left-sided pacemaker device in place with lead tips projecting over the expected location of the right atrium and right ventricle. IMPRESSION: 1. No radiographic evidence of acute cardiopulmonary disease. 2. Mild cardiomegaly. 3. Aortic atherosclerosis. Electronically Signed   By: Vinnie Langton M.D.   On: 11/21/2020 10:54   ECHOCARDIOGRAM COMPLETE  Result Date: 11/07/2020     IMPRESSIONS  1. Left ventricular ejection fraction, by estimation, is 55 to 60%. The left ventricle has normal function. The left ventricle has no regional wall motion abnormalities. There is mild concentric left ventricular hypertrophy. Left ventricular diastolic parameters are indeterminate.  2. Right ventricular systolic function is mildly reduced. The right ventricular size is severely enlarged.   3. Left atrial size was severely dilated.   4. Right atrial size was severely dilated.  5. The mitral valve is degenerative. Mild to moderate mitral valve regurgitation. Severe mitral annular calcification.  6. Tricuspid valve regurgitation is  moderate, eccentric and appears to have two jets. Eccentricity may underestimate severity.   7. The aortic valve has been repaired/replaced. Aortic valve regurgitation is mild to moderate. Procedure Date: 05/30/2018. Aortic valve mean gradient measures 16.8 mmHg.   CT Angio Chest/Abd/Pel for Dissection W and/or W/WO Result Date: 11/13/2020    IMPRESSION: Vascular  1. No evidence of acute aortic syndrome.  2. Stable dilatation of the ascending thoracic aorta to 4 cm. Recommend annual imaging followup by CTA or MRA. This recommendation follows 2010 ACCF/AHA/AATS/ACR/ASA/SCA/SCAI/SIR/STS/SVM Guidelines for the Diagnosis and Management of Patients with Thoracic Aortic Disease. Circulation. 2010; 121: Q683-M196. Aortic aneurysm NOS (ICD10-I71.9) 3. No evidence of pulmonary embolism. 4. Cardiomegaly with right atrioventricular enlargement. Reflux of contrast into the hepatic veins and IVC, suggestive of right heart dysfunction. Central pulmonary artery enlargement may reflect further elevation of the right sided pressures or chronic pulmonary artery hypertension. 5.  Aortic Atherosclerosis (ICD10-I70.0). 6. Mild to moderate ostial narrowing of the inferior mesenteric artery. Mild ostial narrowing of the bilateral renal arteries and SMA.  7. Mild ectasia of the left internal iliac, unchanged from prior Nonvascular  1. Foley catheter in place. Mild bladder wall thickening with perivesicular hazy stranding, correlate with urinalysis to exclude cystitis. No obstructive urolithiasis or hydronephrosis is seen.  2. Cholelithiasis with mild gallbladder wall thickening versus adenomyomatosis at the gallbladder fundus. Correlate with right upper quadrant symptoms and consider further evaluation with right upper quadrant ultrasound.  3. Stable appearance of a 2 cm hypoattenuating the partially calcified right posterior thyroid nodule. This has been evaluated on previous imaging. (ref: J Am Coll Radiol. 2015 Feb;12(2):  143-50). 4. Stable regions of subpleural scarring and architectural distortion in the right upper lobe. Electronically Signed   By: Elwin Sleight.D.  On: 11/13/2020 22:49   US Abdomen Limited RUQ (LIVER/GB)  Result Date: 11/14/2020 CLINICAL DATA:  Pain EXAM: ULTRASOUND ABDOMEN LIMITED RIGHT UPPER QUADRANT COMPARISON:  11/13/2020 FINDINGS: Gallbladder: The sonographic Percell Miller sign is negative. There are gallstones without significant gallbladder wall thickening or pericholecystic free fluid. Common bile duct: Diameter: 5 mm Liver: No focal lesion identified. Within normal limits in parenchymal echogenicity. Portal vein is patent on color Doppler imaging with normal direction of blood flow towards the liver. Other: None. IMPRESSION: There is cholelithiasis without secondary signs of acute cholecystitis. Electronically Signed   By: Constance Holster M.D.   On: 11/14/2020 00:08    SIGNED: Deatra James, MD, FHM. [facility],  Pager (please use [email or web address] to page/text) Please use Epic Secure Chat for non-urgent communication (7AM-7PM)  If 7PM-7AM, please contact night-coverage [email or web address], 11/23/2020, 11:12 AM

## 2020-11-23 NOTE — Progress Notes (Signed)
Spoke with charge RN RE: allowing family to stay with patient tonight due to his dementia, increased confusion, attempting to get out of bed, and pulling out IV. Charge RN agreeable and pt's wife made aware that their son, Robin Pafford, may stay with patient

## 2020-11-24 DIAGNOSIS — R319 Hematuria, unspecified: Secondary | ICD-10-CM | POA: Diagnosis not present

## 2020-11-24 DIAGNOSIS — I4819 Other persistent atrial fibrillation: Secondary | ICD-10-CM

## 2020-11-24 DIAGNOSIS — R531 Weakness: Principal | ICD-10-CM

## 2020-11-24 DIAGNOSIS — N390 Urinary tract infection, site not specified: Secondary | ICD-10-CM | POA: Diagnosis not present

## 2020-11-24 DIAGNOSIS — R4182 Altered mental status, unspecified: Secondary | ICD-10-CM

## 2020-11-24 DIAGNOSIS — R413 Other amnesia: Secondary | ICD-10-CM

## 2020-11-24 DIAGNOSIS — Z978 Presence of other specified devices: Secondary | ICD-10-CM | POA: Diagnosis not present

## 2020-11-24 DIAGNOSIS — G301 Alzheimer's disease with late onset: Secondary | ICD-10-CM | POA: Diagnosis not present

## 2020-11-24 DIAGNOSIS — F0281 Dementia in other diseases classified elsewhere with behavioral disturbance: Secondary | ICD-10-CM | POA: Diagnosis not present

## 2020-11-24 NOTE — TOC Initial Note (Signed)
Transition of Care [facility]) - Initial/Assessment Note    Patient Details  Name: Jeffery Rogers MRN: [id number] Date of Birth: [birth year]  Transition of Care [facility]) CM/SW Contact:    Jeffery Phi, RN Phone Number: 11/24/2020, 6:17 PM  Clinical Narrative:  Jeffery Rogers to dtr about dc plans-d/c to SNF-[name] doctor.Will need covid prior d/c.                 Expected Discharge Plan: Skilled Nursing Facility Barriers to Discharge: Continued Medical Work up   Patient Goals and CMS Choice Patient states their goals for this hospitalization and ongoing recovery are:: go to rehab CMS Medicare.gov Compare Post Acute Care list provided to:: Patient Represenative (must comment) (dtr Jeffery Rogers [phone]) Choice offered to / list presented to : Adult Children  Expected Discharge Plan and Services Expected Discharge Plan: [place]   Discharge Planning Services: CM Consult Post Acute Care Choice: [place] Living arrangements for the past 2 months: Single Family Home                                      Prior Living Arrangements/Services Living arrangements for the past 2 months: Single Family Home Lives with:: Spouse Patient language and need for interpreter reviewed:: Yes Do you feel safe going back to the place where you live?: Yes      Need for Family Participation in Patient Care: No (Comment) Care giver support system in place?: Yes (comment)   Criminal Activity/Legal Involvement Pertinent to Current Situation/Hospitalization: No - Comment as needed  Activities of Daily Living Home Assistive Devices/Equipment: Wheelchair,Hearing aid,Hospital bed,Other (Comment),Shower chair with back ADL Screening (condition at time of admission) Patient's cognitive ability adequate to safely complete daily activities?: No Is the patient deaf or have difficulty hearing?: Yes Does the patient have  difficulty seeing, even when wearing glasses/contacts?: Yes Does the patient have difficulty concentrating, remembering, or making decisions?: Yes Patient able to express need for assistance with ADLs?: Yes Does the patient have difficulty dressing or bathing?: Yes Independently performs ADLs?: No Communication: Independent Dressing (OT): Needs assistance Is this a change from baseline?: Pre-admission baseline Grooming: Needs assistance Is this a change from baseline?: Pre-admission baseline Feeding: Needs assistance Is this a change from baseline?: Pre-admission baseline Bathing: Needs assistance Is this a change from baseline?: Pre-admission baseline Toileting: Dependent Is this a change from baseline?: Pre-admission baseline In/Out Bed: Dependent Is this a change from baseline?: Pre-admission baseline Walks in Home: Dependent Is this a change from baseline?: Pre-admission baseline Does the patient have difficulty walking or climbing stairs?: Yes Weakness of Legs: Both Weakness of Arms/Hands: None  Permission Sought/Granted Permission sought to share information with : Case Manager Permission granted to share information with : Yes, Verbal Permission Granted  Share Information with NAME: Case Manager     Permission granted to share info w Relationship: Jeffery Rogers dtr [id number]     Emotional Assessment Appearance:: Appears stated age Attitude/Demeanor/Rapport: Gracious Affect (typically observed): Accepting Orientation: : Oriented to Self,Oriented to Place Alcohol / Substance Use: Not Applicable Psych Involvement: No (comment)  Admission diagnosis:  Confusion [R41.0] Generalized weakness [R53.1] Altered mental status, unspecified altered mental status type [R41.82] AMS (altered mental status) [R41.82] Patient Active Problem List   Diagnosis Date Noted  . Generalized weakness   . Hematuria 11/22/2020  . Acute lower  UTI 11/22/2020  . Chronic indwelling Foley catheter  11/22/2020  . AMS (altered mental status) 11/21/2020  . Confusion 11/20/2020  . Late onset Alzheimer's disease with behavioral disturbance ([place]) 11/19/2019  . Dementia with behavioral disturbance ([place])   . Fall 03/07/2019  . Noise effect on both inner ears 10/06/2018  . Memory loss   . Presence of permanent cardiac pacemaker   . Persistent atrial fibrillation ([place])   . S/P TAVR (transcatheter aortic valve replacement) 05/30/2018  . Severe aortic stenosis   . Tendinopathy of left rotator cuff 12/19/2017  . Bilateral impacted cerumen 11/25/2017  . Presbycusis of both ears 11/25/2017  . Vasomotor rhinitis 11/25/2017  . Dizziness 05/15/2017  . Chronic right maxillary sinusitis 03/30/2017  . Rhinitis 03/30/2017  . Elevated CPK 02/18/2017  . Sinusitis 02/18/2017  . Peripheral neuropathy 02/18/2017  . Chronic maxillary sinusitis 02/18/2017  . Mild cognitive impairment 01/12/2017  . Essential tremor 01/12/2017  . Orthostatic dizziness 01/12/2017  . Diplopia 01/12/2017  . First degree atrioventricular block 03/29/2013  . Hypercholesteremia   . Goiter    PCP:  [name], MD Pharmacy:   [facility], [place] [id number] Phone: [phone] Fax: [phone]  [facility] [place], [address] [place] [address] Phone: [phone] Fax: [phone]     Social Determinants of Health (SDOH) Interventions    Readmission Risk Interventions Readmission Risk Prevention Plan 06/01/2018  Post Dischage Appt Complete  Medication Screening Complete  Transportation Screening Complete  PCP follow-up Complete  Some recent data might be hidden

## 2020-11-24 NOTE — NC FL2 (Signed)
[place] MEDICAID FL2 LEVEL OF CARE SCREENING TOOL     IDENTIFICATION  Patient Name: Jeffery Rogers Birthdate: 06-19-1932 Sex: male Admission Date (Current Location): 11/21/2020  [facility] and [place] Number:  Herbalist and Address:  [facility],  [place] [address] [place]      Provider Number: [id number]  Attending Physician Name and Address:  Deatra James, MD  Relative Name and Phone Number:  Dino Borntreger [phone]    Current Level of Care: Hospital Recommended Level of Care: [place] Prior Approval Number:    Date Approved/Denied:   PASRR Number:    Discharge Plan: SNF    Current Diagnoses: Patient Active Problem List   Diagnosis Date Noted  . Generalized weakness   . Hematuria 11/22/2020  . Acute lower UTI 11/22/2020  . Chronic indwelling Foley catheter 11/22/2020  . AMS (altered mental status) 11/21/2020  . Confusion 11/20/2020  . Late onset Alzheimer's disease with behavioral disturbance ([place]) 11/19/2019  . Dementia with behavioral disturbance ([place])   . Fall 03/07/2019  . Noise effect on both inner ears 10/06/2018  . Memory loss   . Presence of permanent cardiac pacemaker   . Persistent atrial fibrillation ([place])   . S/P TAVR (transcatheter aortic valve replacement) 05/30/2018  . Severe aortic stenosis   . Tendinopathy of left rotator cuff 12/19/2017  . Bilateral impacted cerumen 11/25/2017  . Presbycusis of both ears 11/25/2017  . Vasomotor rhinitis 11/25/2017  . Dizziness 05/15/2017  . Chronic right maxillary sinusitis 03/30/2017  . Rhinitis 03/30/2017  . Elevated CPK 02/18/2017  . Sinusitis 02/18/2017  . Peripheral neuropathy 02/18/2017  . Chronic maxillary sinusitis 02/18/2017  . Mild cognitive impairment 01/12/2017  . Essential tremor 01/12/2017  . Orthostatic dizziness 01/12/2017  . Diplopia 01/12/2017  . First degree atrioventricular block 03/29/2013  . Hypercholesteremia   . Goiter      Orientation RESPIRATION BLADDER Height & Weight     Self,Place  Normal Indwelling catheter Weight: 78 kg (from previous chart) Height:  6' (182.9 cm) (from previous charting)  BEHAVIORAL SYMPTOMS/MOOD NEUROLOGICAL BOWEL NUTRITION STATUS      Continent Diet (dysphagia 1)  AMBULATORY STATUS COMMUNICATION OF NEEDS Skin   Limited Assist Verbally Normal                       Personal Care Assistance Level of Assistance  Bathing,Feeding,Dressing Bathing Assistance: Limited assistance Feeding assistance: Limited assistance Dressing Assistance: Limited assistance     Functional Limitations Info  Sight,Speech,Hearing Sight Info: Impaired (eyeglasses) Hearing Info: Impaired (bilateral hearing aids) Speech Info: Adequate    SPECIAL CARE FACTORS FREQUENCY  PT (By licensed PT),OT (By licensed OT)     PT Frequency: 5x week OT Frequency: 5x week            Contractures Contractures Info: Not present    Additional Factors Info  Code Status,Allergies,Psychotropic Code Status Info:  (Full) Allergies Info:  (NKA) Psychotropic Info:  (xanax,haldol,trazadone-see MAR)         Current Medications (11/24/2020):  This is the current hospital active medication list Current Facility-Administered Medications  Medication Dose Route Frequency Provider Last Rate Last Admin  .  stroke: mapping our early stages of recovery book   Does not apply Once Marylyn Ishihara, Tyrone A, DO      . 0.9 %  sodium chloride infusion   Intravenous Continuous Kyle, Tyrone A, DO 75 mL/hr at 11/24/20 1731 New Bag at  11/24/20 1731  . acetaminophen (TYLENOL) tablet 650 mg  650 mg Oral Q4H PRN Marylyn Ishihara, Tyrone A, DO   650 mg at 11/22/20 2317   Or  . acetaminophen (TYLENOL) 160 MG/5ML solution 650 mg  650 mg Per Tube Q4H PRN Marylyn Ishihara, Tyrone A, DO       Or  . acetaminophen (TYLENOL) suppository 650 mg  650 mg Rectal Q4H PRN Marylyn Ishihara, Tyrone A, DO      . ALPRAZolam Duanne Moron) tablet 0.25 mg  0.25 mg Oral BID Shahmehdi, Seyed A, MD    0.25 mg at 11/24/20 1004  . amLODipine (NORVASC) tablet 5 mg  5 mg Oral Daily Shahmehdi, Seyed A, MD   5 mg at 11/24/20 1004  . cefTRIAXone (ROCEPHIN) 2 g in sodium chloride 0.9 % 100 mL IVPB  2 g Intravenous Q24H Kyle, Tyrone A, DO 200 mL/hr at 11/24/20 1412 2 g at 11/24/20 1412  . Chlorhexidine Gluconate Cloth 2 % PADS 6 each  6 each Topical Daily Kyle, Tyrone A, DO   6 each at 11/24/20 1003  . glycopyrrolate (ROBINUL) tablet 1 mg  1 mg Oral TID Skipper Cliche A, MD   1 mg at 11/24/20 1728  . haloperidol lactate (HALDOL) injection 2 mg  2 mg Intravenous Q6H PRN Skipper Cliche A, MD   2 mg at 11/24/20 0011  . morphine 2 MG/ML injection 1 mg  1 mg Intravenous Q4H PRN Skipper Cliche A, MD   1 mg at 11/24/20 1242  . polyvinyl alcohol (LIQUIFILM TEARS) 1.4 % ophthalmic solution 1 drop  1 drop Both Eyes PRN Marylyn Ishihara, Tyrone A, DO      . senna-docusate (Senokot-S) tablet 1 tablet  1 tablet Oral QHS PRN Marylyn Ishihara, Tyrone A, DO      . traZODone (DESYREL) tablet 50 mg  50 mg Oral QHS Skipper Cliche A, MD   50 mg at 11/23/20 2106     Discharge Medications: Please see discharge summary for a list of discharge medications.  Relevant Imaging Results:  Relevant Lab Results:   Additional Information ss#[id number]  Tia Gelb, Juliann Pulse, RN

## 2020-11-24 NOTE — Progress Notes (Signed)
PROGRESS NOTE    Patient: Jeffery Rogers                            PCP: [name], MD                    DOB: Jun 09, 1932            DOA: 11/21/2020 FBP:[id number]             DOS: 11/24/2020, 12:32 PM   LOS: 3 days   Date of Service: The patient was seen and examined on 11/24/2020  Subjective:   Patient was seen and examined this morning, awake but lethargic, very confused.  Apparently had more agitation last night staff agreed for family member to assist stay at bedside.  Remained to have Foley catheter, urine becoming clear  Brief Narrative:   Jeffery Rogers is a 87 y.o. male with medical history significant of complete heart block s/p pacer, a fib, HLD, dementia. History is from son at bedside as patient has dementia and is a poor historian. His son reports that at the beginning of January, the patient was able to perform all his ADLs and walk on his own. Over the last three weeks or so, he's had a steady decline and mentation and function. The son has noticed that he has increased weakness in his legs. He is now unable to walk on his own. He is unable to perform his ADLs. He is more confused and less interactive. He has been to his neurologist who did a [place]. It was negative for acute stroke. He has been seen by urology for UTI and the ED for abdominal pain. He was started on abx for a presumed UTI. However, his overall functional status has not improved. His son felt it was time to return to the ED for assistance.   ED Course: No acute findings on CTA PE or CT ab/pelvis. He had a brief episode of hypoxia that has since resolved. There was concern for stroke. The EDP spoke with neurology. Recommended MRI. TRH was called for admission.   Assessment & Plan:   Principal Problem:   AMS (altered mental status) Active Problems:   Hematuria   Acute lower UTI   Essential tremor   Elevated CPK   S/P TAVR (transcatheter aortic valve replacement)   Persistent atrial fibrillation  (HCC)   Memory loss   Dementia with behavioral disturbance (HCC)   Late onset Alzheimer's disease with behavioral disturbance (HCC)   Confusion   Chronic indwelling Foley catheter   Altered mental status / Hx of dementia -with behavior changes agitation, confusion     -Awake wife present at bedside, very confused  -Again was agitated overnight, antianxiety medication as needed has been ordered      -Family concurs that he is declining mentally and physically since his hospitalization this January     -On admission he is A&O x 1 (which is his baseline according to his son);      -This morning he is not following any commands, wife states he was agitated earlier this morning     -Family reports he does well with his  Po Xanax --was resumed     -Patient has been seen and following neurology regarding continued mental decline, poor cognition   -MRI of the brain: Reviewed -no acute changes or infarct old occipital infarct, significant microvascular changes, atrophy  -MRI spine reviewed -  L4-5 where there is moderate central canal stenosis and mild narrowing in the right subarticular recess. Mild bilateral foraminal narrowing       -Due to multiple comorbidities, and progressive mental and physical decline prognosis remains              Poor --his wife and son is aware, agreed with palliative care consultation  -Confirmed DNR/DNI status, no heroic measures to be taken     - could be a progression of his previous dementia or a slow recovery from the insult of infection     - apparently his dementia meds were held recently by neurology  UTI/frank acute hematuria /chronic urinary retention -indwelling catheter since January 22 -Hematuria improving, Foley cath in place, urine becoming more clear      -Patient's son reports that previous cultures in urology office was positive for morganella     - EDP spoke with urology; they stated it was reasonable to switch to IV abx to help and  re-culture  -He has been restarted on IV antibiotics of Rocephin      - per EDP, urology not planning acute intervention on hematuria     - continue rocephin, check urine culture     - maintain foley     -Last hemoglobin 13.3, would not pursue any further checks,  -Continue to hold his Eliquis -due to ongoing hematuria  Severe progressive generalized weakness, BLE weakness/numbness Dysphagia?    -He is not very cooperative in exam, somnolent, withdrawn, minimally verbal and physical interactive -No changes...      - CTH negative for acute stroke on 2/17     - spoke with neurology PA present at bedside, Re: Repeat MRI; believes MRI     -MRI T-spine, reviewed, consistent with chronic changes     -Consulting SLP eval >> 1 awake speech recommended dysphagia 1, 10 liquid soft solids finger     - PT/OT >> progressive generalized weakness recommending SNF  Afib     - hold eliquis d/t hematuria  Failure to thrive /progressive decline     -Advanced dementia superimposed by infection -resulting to poor p.o. intake, progressive decline, failure to thrive     - palliative care consult --we are recommending comfort care measures, if no improvement likely hospice home  HTN -BP stable    -P.o. Norvasc if he could tolerate  -We will hold home medications due to poor p.o. intake,   Elevated troponin     - trp flat, EKG w/ jxnal rhythm, denies CP; follow symptoms  -Comfortable at this time, in no distress, no signs of chest pain  Elevated CPK -546     -Continue gentle IV fluid, will monitor  Severe debility continue decline mentally and physically Prognosis very poor due to multiple comorbidities, dementia and decline mental and physical status Goal of care -discussed with patient is wife and son at bedside Palliative care consulted -we are recommending comfort care measures,.. If continued decline possibly hospice home ... Family is agreeable to the plan, agreed to no heroic  measures Possibly to treat reversible causes.   DVT prophylaxis: SCDs  Code Status: DNR/DNI Family Communication:  With patient's son and wife at bedside-discussed in detail Consults called: Neurology   Status is: Inpatient  Remains inpatient appropriate because:Inpatient level of care appropriate due to severity of illness   Dispo: The patient is from: Home  Anticipated d/c is to: SNF --- palliative, possibly hospice  Anticipated d/c date is: 3 days  Patient  currently is not medically stable to d/c.              Difficult to place patient No   ----------------------------------------------------------------------------------------------------------------------------------- Nutritional status:  The patient's BMI is: Body mass index is 23.33 kg/m. I agree with the assessment and plan as outlined .Marland Kitchen    ------------------------------------------------------------------------------------------------------------------------------------ Cultures; Blood Cultures x 2 >> NGT Urine Culture  >>> NGT Stool C. difficile negative SARS-CoV-2 negative Influenza A/B negative  Antimicrobials: IV Rocephin   Consultants: Palliative care team/neurology   ------------------------------------------------------------------------------------------------------------------------------------  DVT prophylaxis:  SCD/Compression stockings Code Status:   Code Status: Prior  Family Communication:  Patient's son and wife present at bedside The above findings and plan of care has been discussed with his son, wife in detail,  they expressed understanding and agreement no heroic measures to be taken, any reversible issues to be addressed otherwise as patient declines agreed with comfort care measures.  -Advance care planning has been discussed.   Admission status:   Status is: Inpatient  Remains inpatient appropriate because:Hemodynamically unstable and  Inpatient level of care appropriate due to severity of illness   Dispo: The patient is from: Home              Anticipated d/c is to: To be determined ?  SNF with hospice              Anticipated d/c date is: 3 days              Patient currently is not medically stable to d/c.   Difficult to place patient No    Level of care: Telemetry   Procedures:   No admission procedures for hospital encounter.     Antimicrobials:  Anti-infectives (From admission, onward)   Start     Dose/Rate Route Frequency Ordered Stop   11/22/20 1400  cefTRIAXone (ROCEPHIN) 2 g in sodium chloride 0.9 % 100 mL IVPB        2 g 200 mL/hr over 30 Minutes Intravenous Every 24 hours 11/21/20 2058     11/21/20 1415  cefTRIAXone (ROCEPHIN) 1 g in sodium chloride 0.9 % 100 mL IVPB        1 g 200 mL/hr over 30 Minutes Intravenous  Once 11/21/20 1413 11/21/20 1614       Medication:  .  stroke: mapping our early stages of recovery book   Does not apply Once  . ALPRAZolam  0.25 mg Oral BID  . amLODipine  5 mg Oral Daily  . Chlorhexidine Gluconate Cloth  6 each Topical Daily  . glycopyrrolate  1 mg Oral TID  . traZODone  50 mg Oral QHS    acetaminophen **OR** acetaminophen (TYLENOL) oral liquid 160 mg/5 mL **OR** acetaminophen, haloperidol lactate, morphine injection, polyvinyl alcohol, senna-docusate   Objective:   Vitals:   11/23/20 1402 11/23/20 2041 11/24/20 0449 11/24/20 0740  BP: (!) 147/63 (!) 161/57 133/73   Pulse: 60 60 60   Resp: 18 16    Temp:  98.3 F (36.8 C) (!) 97.5 F (36.4 C)   TempSrc:  Oral Axillary   SpO2: 100% 95% 98%   Weight:    78 kg  Height:    6' (1.829 m)    Intake/Output Summary (Last 24 hours) at 11/24/2020 1232 Last data filed at 11/24/2020 0500 Gross per 24 hour  Intake 120 ml  Output 775 ml  Net -655 ml   Filed Weights   11/24/20 0740  Weight: 78 kg     Examination:  Physical Exam:   General:   Awake remain confused  HEENT:  Normocephalic,  PERRL, otherwise with in Normal limits   Neuro:   Limited exam patient is very confused, moving all 4 extremities  Lungs:   Clear to auscultation BL, Respirations unlabored, no wheezes / crackles  Cardio:    S1/S2, RRR, No murmure, No Rubs or Gallops   Abdomen:   Soft, non-tender, bowel sounds active all four quadrants,  no guarding   Muscular skeletal:  Limited exam - in bed, able to move all 4 extremities,   2+ pulses,  symmetric, No pitting edema  Skin:  Dry, warm to touch, negative for any Rashes,  Wounds: Please see nursing documentation Foley catheter placed, frank hematuria noted         ------------------------------------------------------------------------------------------------------------------------------------------    LABs:  CBC Latest Ref Rng & Units 11/22/2020 11/21/2020 11/20/2020  WBC 4.0 - 10.5 K/uL 7.7 8.0 9.3  Hemoglobin 13.0 - 17.0 g/dL 13.3 15.7 14.9  Hematocrit 39.0 - 52.0 % 40.7 48.4 43.5  Platelets 150 - 400 K/uL 140(L) 151 168   CMP Latest Ref Rng & Units 11/22/2020 11/21/2020 11/20/2020  Glucose 70 - 99 mg/dL 84 71 83  BUN 8 - 23 mg/dL 19 24(H) 23  Creatinine 0.61 - 1.24 mg/dL 1.06 1.24 1.26  Sodium 135 - 145 mmol/L 136 134(L) 135  Potassium 3.5 - 5.1 mmol/L 4.5 4.3 4.7  Chloride 98 - 111 mmol/L 103 100 98  CO2 22 - 32 mmol/L 24 23 18(L)  Calcium 8.9 - 10.3 mg/dL 8.8(L) 9.3 9.5  Total Protein 6.5 - 8.1 g/dL 6.2(L) 7.5 6.9  Total Bilirubin 0.3 - 1.2 mg/dL 0.9 0.9 0.6  Alkaline Phos 38 - 126 U/L 83 105 133(H)  AST 15 - 41 U/L 55(H) 60(H) 52(H)  ALT 0 - 44 U/L 40 48(H) 40       Micro Results Recent Results (from the past 240 hour(s))  Resp Panel by RT-PCR (Flu A&B, Covid) Nasopharyngeal Swab     Status: None   Collection Time: 11/21/20 10:34 AM   Specimen: Nasopharyngeal Swab; Nasopharyngeal(NP) swabs in vial transport medium  Result Value Ref Range Status   SARS Coronavirus 2 by RT PCR NEGATIVE NEGATIVE Final    Comment: (NOTE) SARS-CoV-2  target nucleic acids are NOT DETECTED.  The SARS-CoV-2 RNA is generally detectable in upper respiratory specimens during the acute phase of infection. The lowest concentration of SARS-CoV-2 viral copies this assay can detect is 138 copies/mL. A negative result does not preclude SARS-Cov-2 infection and should not be used as the sole basis for treatment or other patient management decisions. A negative result may occur with  improper specimen collection/handling, submission of specimen other than nasopharyngeal swab, presence of viral mutation(s) within the areas targeted by this assay, and inadequate number of viral copies(<138 copies/mL). A negative result must be combined with clinical observations, patient history, and epidemiological information. The expected result is Negative.  Fact Sheet for Patients:  EntrepreneurPulse.com.au  Fact Sheet for Healthcare Providers:  IncredibleEmployment.be  This test is no t yet approved or cleared by the [place] FDA and  has been authorized for detection and/or diagnosis of SARS-CoV-2 by FDA under an Emergency Use Authorization (EUA). This EUA will remain  in effect (meaning this test can be used) for the duration of the COVID-19 declaration under Section 564(b)(1) of the Act, 21 U.S.C.section 360bbb-3(b)(1), unless the authorization is terminated  or revoked sooner.       Influenza A  by PCR NEGATIVE NEGATIVE Final   Influenza B by PCR NEGATIVE NEGATIVE Final    Comment: (NOTE) The Xpert Xpress SARS-CoV-2/FLU/RSV plus assay is intended as an aid in the diagnosis of influenza from Nasopharyngeal swab specimens and should not be used as a sole basis for treatment. Nasal washings and aspirates are unacceptable for Xpert Xpress SARS-CoV-2/FLU/RSV testing.  Fact Sheet for Patients: EntrepreneurPulse.com.au  Fact Sheet for Healthcare  Providers: IncredibleEmployment.be  This test is not yet approved or cleared by the [place] FDA and has been authorized for detection and/or diagnosis of SARS-CoV-2 by FDA under an Emergency Use Authorization (EUA). This EUA will remain in effect (meaning this test can be used) for the duration of the COVID-19 declaration under Section 564(b)(1) of the Act, 21 U.S.C. section 360bbb-3(b)(1), unless the authorization is terminated or revoked.  Performed at [facility], [place] [address], [place], [place] [address]   Culture, blood (routine x 2)     Status: None (Preliminary result)   Collection Time: 11/21/20 11:24 AM   Specimen: BLOOD RIGHT ARM  Result Value Ref Range Status   Specimen Description   Final    BLOOD RIGHT ARM Performed at [facility] [address] [place], [place] [address]    Special Requests   Final    BOTTLES DRAWN AEROBIC AND ANAEROBIC Blood Culture adequate volume Performed at [facility] [address], [place], [place] [address]    Culture   Final    NO GROWTH 3 DAYS Performed at [facility], [place] [address], [place], [place] [address]    Report Status PENDING  Incomplete  Culture, blood (routine x 2)     Status: None (Preliminary result)   Collection Time: 11/21/20  9:30 PM   Specimen: BLOOD  Result Value Ref Range Status   Specimen Description   Final    BLOOD LEFT ANTECUBITAL Performed at [facility] [address] [place], [place] [address]    Special Requests   Final    BOTTLES DRAWN AEROBIC AND ANAEROBIC Blood Culture adequate volume Performed at [facility] [address], [place], [place] [address]    Culture   Final    NO GROWTH 2 DAYS Performed at [facility] [address], [place], [place] [address]    Report Status PENDING  Incomplete  Urine culture     Status: None   Collection Time: 11/22/20   7:40 AM   Specimen: Urine, Random  Result Value Ref Range Status   Specimen Description   Final    URINE, RANDOM Performed at [place] [address], [place], [place] [address]    Special Requests   Final    NONE Performed at [facility], [place] [address] [place], [place] [address]    Culture   Final    NO GROWTH Performed at [facility], [place] [address] [place], [place] [address]    Report Status 11/23/2020 FINAL  Final  C Difficile Quick Screen w PCR reflex     Status: None   Collection Time: 11/22/20 10:10 AM   Specimen: STOOL  Result Value Ref Range Status   C Diff antigen NEGATIVE NEGATIVE Final   C Diff toxin NEGATIVE NEGATIVE Final   C Diff interpretation No C. difficile detected.  Final    Comment: Performed at [facility], [place] [address], [place], [place] [address]  Radiology Reports DG Chest 2 View  Result Date: 11/13/2020 CLINICAL DATA:  Right upper back pain. EXAM: CHEST - 2 VIEW COMPARISON:  Chest x-ray dated April 16, 2019. FINDINGS: Unchanged left chest wall pacemaker. Interval enlargement of the cardiopericardial silhouette with a globular appearance. Prior TAVR. Normal pulmonary vascularity. No focal consolidation, pleural effusion, or pneumothorax. No acute osseous abnormality. IMPRESSION: 1. Interval enlargement of the cardiopericardial silhouette with a globular appearance, raising concern for pericardial effusion. Electronically Signed   By: Titus Dubin M.D.   On: 11/13/2020 19:55   CT HEAD WO CONTRAST  Result Date: 11/21/2020 CLINICAL DATA:  87 year old male with progressive altered mental status, generalized weakness. This exam originally designated as [facility] to report, however, the [facility] Emergency Department contacted me by phone requesting urgent interpretation. EXAM: CT HEAD WITHOUT CONTRAST TECHNIQUE: Contiguous axial images were obtained from the base of  the skull through the vertex without intravenous contrast. COMPARISON:  Head CT 01/14/2017. FINDINGS: Brain: Encephalomalacia at the left occipital pole is new since 2018 and associated with mild interval enlargement of the left occipital horn. Patchy bilateral cerebral white matter hypodensity has progressed since 2018, most notably the posterior limb right internal capsule on series 2, image 19. No midline shift, ventriculomegaly, mass effect, evidence of mass lesion, intracranial hemorrhage or evidence of cortically based acute infarction. Vascular: Calcified atherosclerosis at the skull base. Evidence of a small unruptured 6-7 mm basilar tip aneurysm or focal vessel ectasia (coronal image 38), probably not significantly changed since 2018. No suspicious intracranial vascular hyperdensity. Skull: No acute osseous abnormality identified. Sinuses/Orbits: Chronic right maxillary sinusitis with mucoperiosteal thickening and inspissated material. Other Visualized paranasal sinuses and mastoids are clear. Other: No acute orbit or scalp soft tissue finding identified. IMPRESSION: 1. No acute intracranial abnormality by CT. - small Left PCA infarct at the left occipital pole is new since 2018 but appears chronic. - Progressed cerebral white matter disease since 2018, most likely due to small vessel ischemia. 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 2. Chronic right maxillary sinusitis. The above was discussed by telephone on 11/21/2020 at 1019 hours with ED provider Georga Kaufmann. Electronically Signed   By: Genevie Ann M.D.   On: 11/21/2020 10:32   CT Angio Chest PE W and/or Wo Contrast  Result Date: 11/21/2020 CLINICAL DATA:  Chest and left flank pain. Hypoxia and altered mental status. EXAM: CT ANGIOGRAPHY CHEST CT ABDOMEN AND PELVIS WITH CONTRAST TECHNIQUE: Multidetector CT imaging of the chest was performed using the standard protocol during bolus administration of  intravenous contrast. Multiplanar CT image reconstructions and MIPs were obtained to evaluate the vascular anatomy. Multidetector CT imaging of the abdomen and pelvis was performed using the standard protocol during bolus administration of intravenous contrast. CONTRAST:  164mL OMNIPAQUE IOHEXOL 350 MG/ML SOLN COMPARISON:  CT a chest, abdomen, and pelvis dated November 13, 2020. FINDINGS: CTA CHEST FINDINGS Cardiovascular: Satisfactory opacification of the pulmonary arteries to the segmental level. No evidence of pulmonary embolism. Stable cardiomegaly status post TAVR. No pericardial effusion. Unchanged mild aneurysmal dilatation of the mid ascending thoracic aorta measuring up to 4.0 cm. Coronary, aortic arch, and branch vessel atherosclerotic vascular disease. Unchanged left chest wall pacemaker. Mediastinum/Nodes: No enlarged mediastinal, hilar, or axillary lymph nodes. Unchanged 2.0 cm partially calcified hypodense nodule in the right thyroid lobe This has been evaluated on previous imaging. The trachea and esophagus demonstrate no significant findings. Lungs/Pleura: Minimal dependent subsegmental atelectasis  in both lower lobes. No focal consolidation, pleural effusion, or pneumothorax. No suspicious pulmonary nodule. Biapical pleuroparenchymal scarring, greater on the right. Musculoskeletal: No chest wall abnormality. No acute or significant osseous findings. Review of the MIP images confirms the above findings. CT ABDOMEN AND PELVIS FINDINGS Hepatobiliary: No focal liver abnormality. Unchanged gallstone. No gallbladder wall thickening or biliary dilatation. Probable adenomyomatosis at the gallbladder fundus. Pancreas: Unremarkable. No pancreatic ductal dilatation or surrounding inflammatory changes. Spleen: Normal in size without focal abnormality. Adrenals/Urinary Tract: Adrenal glands are unremarkable. Unchanged bilateral renal cysts. No renal calculi or hydronephrosis. The bladder is decompressed by Foley  catheter. Stomach/Bowel: Stomach is within normal limits. Appendix appears normal. No evidence of bowel wall thickening, distention, or inflammatory changes. Redundant sigmoid colon. Vascular/Lymphatic: Aortic atherosclerosis. No enlarged abdominal or pelvic lymph nodes. Reproductive: Unchanged mild prostatomegaly. Other: No abdominal wall hernia or abnormality. No abdominopelvic ascites. No pneumoperitoneum. Musculoskeletal: No acute or significant osseous findings. Review of the MIP images confirms the above findings. IMPRESSION: Chest: 1. No evidence of pulmonary embolism. No acute intrathoracic process. 2. Unchanged mild aneurysmal dilatation of the mid ascending thoracic aorta measuring up to 4.0 cm. Recommend annual imaging followup by CTA or MRA. This recommendation follows 2010 ACCF/AHA/AATS/ACR/ASA/SCA/SCAI/SIR/STS/SVM Guidelines for the Diagnosis and Management of Patients with Thoracic Aortic Disease. Circulation. 2010; 121: D638-V564. Aortic aneurysm NOS (ICD10-I71.9) 3.  Aortic atherosclerosis (ICD10-I70.0). Abdomen and pelvis: 1. No acute intra-abdominal process. 2. Unchanged cholelithiasis. Electronically Signed   By: Titus Dubin M.D.   On: 11/21/2020 13:07   MR BRAIN WO CONTRAST  Result Date: 11/21/2020 CLINICAL DATA:  Dementia.  Recent worsening.  Weakness. EXAM: MRI HEAD WITHOUT CONTRAST TECHNIQUE: Multiplanar, multiecho pulse sequences of the brain and surrounding structures were obtained without intravenous contrast. COMPARISON:  Head CT yesterday. FINDINGS: Brain: Diffusion imaging does not show any acute or subacute infarction. Chronic small-vessel ischemic changes affect the pons. Few old small vessel cerebellar infarctions. Cerebral hemispheres show generalized atrophy, possibly with temporal lobe predominance. There is extensive chronic small-vessel ischemic change of the hemispheric white matter. 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 No evidence of mass lesion, hemorrhage,  hydrocephalus or extra-axial collection. Vascular: Major vessels at the base of the brain show flow. Ectasia of the basilar tip as seen previously with maximal diameter 6.4 mm. Skull and upper cervical spine: Negative Sinuses/Orbits: Rhinosinusitis of the right maxillary sinus. Orbits negative. Other: None  IMPRESSION:  1. No acute or reversible finding. Generalized atrophy, possibly with some temporal lobe predominance. Extensive chronic small-vessel ischemic changes throughout the brain. 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  2. Ectasia of the basilar tip as seen previously with maximal diameter 6.4 mm. Electronically Signed   By: Nelson Chimes M.D.   On: 11/21/2020 19:15   MR LUMBAR SPINE WO CONTRAST  Result Date: 11/21/2020 CLINICAL DATA:  Increasing leg weakness developing over the past 3-4 weeks. EXAM: MRI LUMBAR SPINE WITHOUT CONTRAST TECHNIQUE: Multiplanar, multisequence MR imaging of the lumbar spine was performed. No intravenous contrast was administered. COMPARISON:  MRI lumbar spine 06/17/2012. FINDINGS: Segmentation:  Standard. Alignment:  No listhesis. Vertebrae: No fracture, evidence of discitis, or bone lesion. Degenerative endplate signal change P3-2 and L5-S1 has progressed since the prior exam. Conus medullaris and cauda equina: Conus extends to the L1 level. Conus and cauda equina appear normal. Paraspinal and other soft tissues: T2 hyperintense lesion in the right kidney is most consistent with a cyst. Disc levels: T12-L1: Negative. L1-2: Negative. L2-3: Negative. L3-4: Shallow disc bulge and mild  facet degenerative change. No stenosis. L4-5: There has been progression of spondylosis at this level. Loss of disc space height with a disc bulge, ligamentum flavum thickening and moderate facet arthropathy are seen. There is moderate central canal stenosis and mild narrowing in the right subarticular recess. Mild foraminal narrowing is more notable on the right. L5-S1: There has been some  progression of disease at this level. Loss of disc space height with a bulge eccentric to the left. The central canal is open. Mild to moderate bilateral foraminal narrowing.  IMPRESSION: Some progression of spondylosis at L4-5 where there is moderate central canal stenosis and mild narrowing in the right subarticular recess. Mild bilateral foraminal narrowing is more notable on the right at this level. Some progression of spondylosis at L5-S1 where there is mild to moderate bilateral foraminal narrowing. The central canal is patent. Electronically Signed   By: Inge Rise M.D.   On: 11/21/2020 19:20   CT ABDOMEN PELVIS W CONTRAST  Result Date: 11/21/2020 CLINICAL DATA:  Chest and left flank pain. Hypoxia and altered mental status. EXAM: CT ANGIOGRAPHY CHEST CT ABDOMEN AND PELVIS WITH CONTRAST TECHNIQUE: Multidetector CT imaging of the chest was performed using the standard protocol during bolus administration of intravenous contrast. Multiplanar CT image reconstructions and MIPs were obtained to evaluate the vascular anatomy. Multidetector CT imaging of the abdomen and pelvis was performed using the standard protocol during bolus administration of intravenous contrast. CONTRAST:  173mL OMNIPAQUE IOHEXOL 350 MG/ML SOLN COMPARISON:  CT a chest, abdomen, and pelvis dated November 13, 2020. FINDINGS: CTA CHEST FINDINGS Cardiovascular: Satisfactory opacification of the pulmonary arteries to the segmental level. No evidence of pulmonary embolism. Stable cardiomegaly status post TAVR. No pericardial effusion. Unchanged mild aneurysmal dilatation of the mid ascending thoracic aorta measuring up to 4.0 cm. Coronary, aortic arch, and branch vessel atherosclerotic vascular disease. Unchanged left chest wall pacemaker. Mediastinum/Nodes: No enlarged mediastinal, hilar, or axillary lymph nodes. Unchanged 2.0 cm partially calcified hypodense nodule in the right thyroid lobe This has been evaluated on previous imaging.  The trachea and esophagus demonstrate no significant findings. Lungs/Pleura: Minimal dependent subsegmental atelectasis in both lower lobes. No focal consolidation, pleural effusion, or pneumothorax. No suspicious pulmonary nodule. Biapical pleuroparenchymal scarring, greater on the right. Musculoskeletal: No chest wall abnormality. No acute or significant osseous findings. Review of the MIP images confirms the above findings. CT ABDOMEN AND PELVIS FINDINGS Hepatobiliary: No focal liver abnormality. Unchanged gallstone. No gallbladder wall thickening or biliary dilatation. Probable adenomyomatosis at the gallbladder fundus. Pancreas: Unremarkable. No pancreatic ductal dilatation or surrounding inflammatory changes. Spleen: Normal in size without focal abnormality. Adrenals/Urinary Tract: Adrenal glands are unremarkable. Unchanged bilateral renal cysts. No renal calculi or hydronephrosis. The bladder is decompressed by Foley catheter. Stomach/Bowel: Stomach is within normal limits. Appendix appears normal. No evidence of bowel wall thickening, distention, or inflammatory changes. Redundant sigmoid colon. Vascular/Lymphatic: Aortic atherosclerosis. No enlarged abdominal or pelvic lymph nodes. Reproductive: Unchanged mild prostatomegaly. Other: No abdominal wall hernia or abnormality. No abdominopelvic ascites. No pneumoperitoneum. Musculoskeletal: No acute or significant osseous findings. Review of the MIP images confirms the above findings. IMPRESSION: Chest: 1. No evidence of pulmonary embolism. No acute intrathoracic process. 2. Unchanged mild aneurysmal dilatation of the mid ascending thoracic aorta measuring up to 4.0 cm. Recommend annual imaging followup by CTA or MRA. This recommendation follows 2010 ACCF/AHA/AATS/ACR/ASA/SCA/SCAI/SIR/STS/SVM Guidelines for the Diagnosis and Management of Patients with Thoracic Aortic Disease. Circulation. 2010; 121: G536-I680. Aortic aneurysm NOS (ICD10-I71.9) 3.  Aortic  atherosclerosis (ICD10-I70.0). Abdomen and pelvis: 1. No acute intra-abdominal process. 2. Unchanged cholelithiasis. Electronically Signed   By: Titus Dubin M.D.   On: 11/21/2020 13:07   DG Chest Portable 1 View  Result Date: 11/21/2020 CLINICAL DATA:  87 year old male with history of altered mental status. Weakness. EXAM: PORTABLE CHEST 1 VIEW COMPARISON:  Chest x-ray 11/13/2020. FINDINGS: Lung volumes are normal. No consolidative airspace disease. No pleural effusions. No suspicious appearing pulmonary nodules or masses are noted. No pneumothorax. No evidence of pulmonary edema. Mild cardiomegaly. Upper mediastinal contours are within normal limits. Atherosclerotic calcifications in the thoracic aorta. Status post TAVR. Left-sided pacemaker device in place with lead tips projecting over the expected location of the right atrium and right ventricle. IMPRESSION: 1. No radiographic evidence of acute cardiopulmonary disease. 2. Mild cardiomegaly. 3. Aortic atherosclerosis. Electronically Signed   By: Vinnie Langton M.D.   On: 11/21/2020 10:54   ECHOCARDIOGRAM COMPLETE  Result Date: 11/07/2020     IMPRESSIONS  1. Left ventricular ejection fraction, by estimation, is 55 to 60%. The left ventricle has normal function. The left ventricle has no regional wall motion abnormalities. There is mild concentric left ventricular hypertrophy. Left ventricular diastolic parameters are indeterminate.  2. Right ventricular systolic function is mildly reduced. The right ventricular size is severely enlarged.   3. Left atrial size was severely dilated.   4. Right atrial size was severely dilated.  5. The mitral valve is degenerative. Mild to moderate mitral valve regurgitation. Severe mitral annular calcification.  6. Tricuspid valve regurgitation is moderate, eccentric and appears to have two jets. Eccentricity may underestimate severity.   7. The aortic valve has been repaired/replaced. Aortic valve regurgitation is  mild to moderate. Procedure Date: 05/30/2018. Aortic valve mean gradient measures 16.8 mmHg.   CT Angio Chest/Abd/Pel for Dissection W and/or W/WO Result Date: 11/13/2020    IMPRESSION: Vascular  1. No evidence of acute aortic syndrome.  2. Stable dilatation of the ascending thoracic aorta to 4 cm. Recommend annual imaging followup by CTA or MRA. This recommendation follows 2010 ACCF/AHA/AATS/ACR/ASA/SCA/SCAI/SIR/STS/SVM Guidelines for the Diagnosis and Management of Patients with Thoracic Aortic Disease. Circulation. 2010; 121: Y865-H846. Aortic aneurysm NOS (ICD10-I71.9) 3. No evidence of pulmonary embolism. 4. Cardiomegaly with right atrioventricular enlargement. Reflux of contrast into the hepatic veins and IVC, suggestive of right heart dysfunction. Central pulmonary artery enlargement may reflect further elevation of the right sided pressures or chronic pulmonary artery hypertension. 5.  Aortic Atherosclerosis (ICD10-I70.0). 6. Mild to moderate ostial narrowing of the inferior mesenteric artery. Mild ostial narrowing of the bilateral renal arteries and SMA.  7. Mild ectasia of the left internal iliac, unchanged from prior Nonvascular  1. Foley catheter in place. Mild bladder wall thickening with perivesicular hazy stranding, correlate with urinalysis to exclude cystitis. No obstructive urolithiasis or hydronephrosis is seen.  2. Cholelithiasis with mild gallbladder wall thickening versus adenomyomatosis at the gallbladder fundus. Correlate with right upper quadrant symptoms and consider further evaluation with right upper quadrant ultrasound.  3. Stable appearance of a 2 cm hypoattenuating the partially calcified right posterior thyroid nodule. This has been evaluated on previous imaging. (ref: J Am Coll Radiol. 2015 Feb;12(2): 143-50). 4. Stable regions of subpleural scarring and architectural distortion in the right upper lobe. Electronically Signed   By: Lovena Le M.D.   On: 11/13/2020 22:49    US Abdomen Limited RUQ (LIVER/GB)  Result Date: 11/14/2020 CLINICAL DATA:  Pain EXAM: ULTRASOUND ABDOMEN LIMITED RIGHT UPPER QUADRANT COMPARISON:  11/13/2020 FINDINGS:  Gallbladder: The sonographic Percell Miller sign is negative. There are gallstones without significant gallbladder wall thickening or pericholecystic free fluid. Common bile duct: Diameter: 5 mm Liver: No focal lesion identified. Within normal limits in parenchymal echogenicity. Portal vein is patent on color Doppler imaging with normal direction of blood flow towards the liver. Other: None. IMPRESSION: There is cholelithiasis without secondary signs of acute cholecystitis. Electronically Signed   By: Constance Holster M.D.   On: 11/14/2020 00:08    SIGNED: Deatra James, MD, FHM. [facility],  Pager (please use [email or web address] to page/text) Please use Epic Secure Chat for non-urgent communication (7AM-7PM)  If 7PM-7AM, please contact night-coverage [email or web address], 11/24/2020, 12:32 PM

## 2020-11-24 NOTE — Care Management (Signed)
Transition of Care (TOC) -30 day Note       Patient Details   Name: Jeffery Rogers  JQZ:[id number]  Date of Birth:[birth year]     Transition of Care [facility]) CM/SW Contact   Name:Nusrat Encarnacion McCloud  Phone Number:[phone]  Date:11/24/2020  Time:6:10p     MUST [id number]     To Whom it May Concern:     Please be advised that the above patient will require a short-term nursing home stay, anticipated 30 days or less rehabilitation and strengthening. The plan is for return home.

## 2020-11-24 NOTE — Plan of Care (Signed)
  Problem: Activity: Goal: Risk for activity intolerance will decrease Outcome: Progressing   Problem: Education: Goal: Knowledge of General Education information will improve Description: Including pain rating scale, medication(s)/side effects and non-pharmacologic comfort measures Outcome: Completed/Met

## 2020-11-24 NOTE — Care Management Important Message (Signed)
Important Message  Patient Details IM Letter placed in Patient's room. Name: Jeffery Rogers MRN: [id number] Date of Birth: [birth year]   Medicare Important Message Given:  Yes     Kerin Salen 11/24/2020, 12:36 PM

## 2020-11-24 NOTE — Progress Notes (Signed)
Daily Progress Note   Patient Name: Jeffery Rogers       Date: 11/24/2020 DOB: 02/25/1932  Age: 87 y.o. MRN#: [id number] Attending Physician: Deatra James, MD Primary Care Physician: [name], MD Admit Date: 11/21/2020  Reason for Consultation/Follow-up: Establishing goals of care  Subjective: Patient is awake, reasonably alert.  Wife present at the bedside.  Patient does not appear to be in distress.  Attempting to eat some, oral intake is minimal to moderate.  Length of Stay: 3  Current Medications: Scheduled Meds:  .  stroke: mapping our early stages of recovery book   Does not apply Once  . ALPRAZolam  0.25 mg Oral BID  . amLODipine  5 mg Oral Daily  . Chlorhexidine Gluconate Cloth  6 each Topical Daily  . glycopyrrolate  1 mg Oral TID  . traZODone  50 mg Oral QHS    Continuous Infusions: . sodium chloride 75 mL/hr at 11/24/20 0340  . cefTRIAXone (ROCEPHIN)  IV 2 g (11/24/20 1412)    PRN Meds: acetaminophen **OR** acetaminophen (TYLENOL) oral liquid 160 mg/5 mL **OR** acetaminophen, haloperidol lactate, morphine injection, polyvinyl alcohol, senna-docusate  Physical Exam         Frail-appearing elderly gentleman resting in bed Regular work of breathing S1-S2 Abdomen is not distended Does not have edema Has generalized weakness Is awake and alert some today, is a little hard of hearing, answers a few questions appropriately.  Vital Signs: BP (!) 152/67 (BP Location: Right Arm)   Pulse 60   Temp (!) 97.3 F (36.3 C)   Resp 16   Ht 6' (1.829 m) Comment: from previous charting  Wt 78 kg Comment: from previous chart  SpO2 98%   BMI 23.33 kg/m  SpO2: SpO2: 98 % O2 Device: O2 Device: Room Air O2 Flow Rate: O2 Flow Rate (L/min): 2 L/min  Intake/output  summary:   Intake/Output Summary (Last 24 hours) at 11/24/2020 1441 Last data filed at 11/24/2020 1412 Gross per 24 hour  Intake 120 ml  Output 1425 ml  Net -1305 ml   LBM: Last BM Date: 11/24/20 Baseline Weight: Weight: 78 kg (from previous chart) Most recent weight: Weight: 78 kg (from previous chart)       Palliative Assessment/Data:      Patient Active Problem List   Diagnosis  Date Noted  . Hematuria 11/22/2020  . Acute lower UTI 11/22/2020  . Chronic indwelling Foley catheter 11/22/2020  . AMS (altered mental status) 11/21/2020  . Confusion 11/20/2020  . Late onset Alzheimer's disease with behavioral disturbance ([place]) 11/19/2019  . Dementia with behavioral disturbance ([place])   . Fall 03/07/2019  . Noise effect on both inner ears 10/06/2018  . Memory loss   . Presence of permanent cardiac pacemaker   . Persistent atrial fibrillation ([place])   . S/P TAVR (transcatheter aortic valve replacement) 05/30/2018  . Severe aortic stenosis   . Tendinopathy of left rotator cuff 12/19/2017  . Bilateral impacted cerumen 11/25/2017  . Presbycusis of both ears 11/25/2017  . Vasomotor rhinitis 11/25/2017  . Dizziness 05/15/2017  . Chronic right maxillary sinusitis 03/30/2017  . Rhinitis 03/30/2017  . Elevated CPK 02/18/2017  . Sinusitis 02/18/2017  . Peripheral neuropathy 02/18/2017  . Chronic maxillary sinusitis 02/18/2017  . Mild cognitive impairment 01/12/2017  . Essential tremor 01/12/2017  . Orthostatic dizziness 01/12/2017  . Diplopia 01/12/2017  . First degree atrioventricular block 03/29/2013  . Hypercholesteremia   . Goiter     Palliative Care Assessment & Plan   Patient Profile:    Assessment: Patient remains admitted to hospital medicine service for altered mental status, has a history of dementia.  Patient with features of mixed delirium, multifactorial in etiology.  Patient has urinary tract infection, initially also had acute frank hematuria.  Patient has  chronic urinary retention and indwelling catheter since January.  Acute to subacute decline, failure to thrive/progressive decline.  Has advanced dementia.  Recommendations/Plan:  Patient remains on IV fluids and IV antibiotics.  Monitoring overall condition.  Wife states that she would prefer for the patient to go to skilled nursing facility for rehabilitation with palliative services following.  If the patient has no benefit from current treatments or if the patient has escalating symptom burden of agitation along with worsening of dementia, then at that time, discussion for residential hospice will be more appropriate.  Palliative to follow.   Code Status: Code Status History    Date Active Date Inactive Code Status Order ID Comments User Context   11/21/2020 2058 11/22/2020 2142 DNR [id number]  Jonnie Finner, DO Inpatient   04/14/2019 1713 04/18/2019 1458 Full Code [id number]  Lacretia Leigh, MD ED   05/30/2018 1859 06/01/2018 1920 Full Code [id number]  Eileen Stanford, PA-C Inpatient   05/03/2018 0947 05/03/2018 1613 Full Code [id number]  Burnell Blanks, MD Inpatient   02/10/2016 1425 02/12/2016 1546 Full Code [id number]  Baldwin Jamaica, PA-C Inpatient   Advance Care Planning Activity    Questions for Most Recent Historical Code Status (Order [id number])    Question Answer   In the event of cardiac or respiratory ARREST Do not call a "code blue"   In the event of cardiac or respiratory ARREST Do not perform Intubation, CPR, defibrillation or ACLS   In the event of cardiac or respiratory ARREST Use medication by any route, position, wound care, and other measures to relive pain and suffering. May use oxygen, suction and manual treatment of airway obstruction as needed for comfort.        Advance Directive Documentation   Flowsheet Row Most Recent Value  Type of Advance Directive Healthcare Power of Attorney, Living will  Pre-existing out of facility DNR order (yellow form or pink MOST  form) -  "MOST" Form in Place? -       Prognosis:   Unable  to determine  Discharge Planning:  To Be Determined  Care plan was discussed with  Patient, wife and TRH MD.   Thank you for allowing the Palliative Medicine Team to assist in the care of this patient.   Time In: 1400 Time Out: 1425 Total Time 25 Prolonged Time Billed  no       Greater than 50%  of this time was spent counseling and coordinating care related to the above assessment and plan.  Loistine Chance, MD  Please contact Palliative Medicine Team phone at [phone] for questions and concerns.

## 2020-11-25 DIAGNOSIS — R531 Weakness: Secondary | ICD-10-CM | POA: Diagnosis not present

## 2020-11-25 DIAGNOSIS — Z7189 Other specified counseling: Secondary | ICD-10-CM | POA: Diagnosis not present

## 2020-11-25 DIAGNOSIS — R748 Abnormal levels of other serum enzymes: Secondary | ICD-10-CM | POA: Diagnosis not present

## 2020-11-25 DIAGNOSIS — N390 Urinary tract infection, site not specified: Principal | ICD-10-CM

## 2020-11-25 DIAGNOSIS — Z515 Encounter for palliative care: Secondary | ICD-10-CM

## 2020-11-25 DIAGNOSIS — R410 Disorientation, unspecified: Secondary | ICD-10-CM

## 2020-11-25 DIAGNOSIS — F0391 Unspecified dementia with behavioral disturbance: Secondary | ICD-10-CM | POA: Diagnosis not present

## 2020-11-25 DIAGNOSIS — Z952 Presence of prosthetic heart valve: Secondary | ICD-10-CM

## 2020-11-25 DIAGNOSIS — G250 Essential tremor: Secondary | ICD-10-CM

## 2020-11-25 DIAGNOSIS — Z978 Presence of other specified devices: Secondary | ICD-10-CM | POA: Diagnosis not present

## 2020-11-25 LAB — SARS CORONAVIRUS 2 (TAT 6-24 HRS): SARS Coronavirus 2: NEGATIVE

## 2020-11-25 NOTE — TOC Transition Note (Addendum)
Transition of Care [facility]) - CM/SW Discharge Note   Patient Details  Name: Jeffery Rogers MRN: [id number] Date of Birth: Nov 25, 1931  Transition of Care [facility]) CM/SW Contact:  Dessa Phi, RN Phone Number: 11/25/2020, 12:36 PM   Clinical Narrative: d/c today to SNF-[facility] w/PCS.Awaiting COVID results. Going to [place] #[phone],nsg call report tel#[phone]-Facility will take patient at any time tonight.DNR,PTAR forms to be put in packet.PTAR to be called once covid results in.   12:54p-Pasrr#[phone] A-placed on fl2.    Final next level of care: Skilled Nursing Facility Barriers to Discharge: No Barriers Identified   Patient Goals and CMS Choice Patient states their goals for this hospitalization and ongoing recovery are:: go to rehab CMS Medicare.gov Compare Post Acute Care list provided to:: Patient Represenative (must comment) (dtr [name] [phone]) Choice offered to / list presented to : Adult Children  Discharge Placement              Patient chooses bed at: [facility] Patient to be transferred to facility by: [place] Name of family member notified: [name] dtr [phone] Patient and family notified of of transfer: 11/25/20  Discharge Plan and Services   Discharge Planning Services: CM Consult Post Acute Care Choice: [place]                               Social Determinants of Health (SDOH) Interventions     Readmission Risk Interventions Readmission Risk Prevention Plan 06/01/2018  Post Dischage Appt Complete  Medication Screening Complete  Transportation Screening Complete  PCP follow-up Complete  Some recent data might be hidden

## 2020-11-25 NOTE — Plan of Care (Signed)
  Problem: Health Behavior/Discharge Planning: Goal: Ability to manage health-related needs will improve Outcome: Progressing   Problem: Clinical Measurements: Goal: Respiratory complications will improve Outcome: Progressing   Problem: Activity: Goal: Risk for activity intolerance will decrease Outcome: Progressing   Problem: Nutrition: Goal: Adequate nutrition will be maintained Outcome: Progressing   Problem: Pain Managment: Goal: General experience of comfort will improve Outcome: Progressing   

## 2020-11-25 NOTE — Progress Notes (Signed)
Palliative care progress note  Reason for consult: Goals of care  I checked in on Jeffery Rogers today.  He was sleeping following medication given this AM for anxiety.  Discussed with his bedside RN.  Plan is in place for [facility].  - DNR/DNI - Plan for trial of rehab.  Plan for him to be followed at facility by outpatient palliative care.  If he is not able to regain functional status or has escalating symptom burden, recommendation is for consideration for hospice services.  Total time: 15 minutes  Greater than 50%  of this time was spent counseling and coordinating care related to the above assessment and plan.  Micheline Rough, MD [place] Team [phone]

## 2020-11-25 NOTE — [place] FL2 (Signed)
[place] MEDICAID FL2 LEVEL OF CARE SCREENING TOOL     IDENTIFICATION  Patient Name: Jeffery Rogers Birthdate: 1932/04/09 Sex: male Admission Date (Current Location): 11/21/2020  [facility] and [place] Number:  Herbalist and Address:  [facility] [place],  [place] [address] [place]      Provider Number: [id number]  Attending Physician Name and Address:  Deatra James, MD  Relative Name and Phone Number:  Kyran Connaughton [phone]    Current Level of Care: Hospital Recommended Level of Care: [place] Prior Approval Number:    Date Approved/Denied:   PASRR Number: [id number]  Discharge Plan: SNF    Current Diagnoses: Patient Active Problem List   Diagnosis Date Noted  . Generalized weakness   . Hematuria 11/22/2020  . Acute lower UTI 11/22/2020  . Chronic indwelling Foley catheter 11/22/2020  . AMS (altered mental status) 11/21/2020  . Confusion 11/20/2020  . Late onset Alzheimer's disease with behavioral disturbance ([place]) 11/19/2019  . Dementia with behavioral disturbance ([place])   . Fall 03/07/2019  . Noise effect on both inner ears 10/06/2018  . Memory loss   . Presence of permanent cardiac pacemaker   . Persistent atrial fibrillation ([place])   . S/P TAVR (transcatheter aortic valve replacement) 05/30/2018  . Severe aortic stenosis   . Tendinopathy of left rotator cuff 12/19/2017  . Bilateral impacted cerumen 11/25/2017  . Presbycusis of both ears 11/25/2017  . Vasomotor rhinitis 11/25/2017  . Dizziness 05/15/2017  . Chronic right maxillary sinusitis 03/30/2017  . Rhinitis 03/30/2017  . Elevated CPK 02/18/2017  . Sinusitis 02/18/2017  . Peripheral neuropathy 02/18/2017  . Chronic maxillary sinusitis 02/18/2017  . Mild cognitive impairment 01/12/2017  . Essential tremor 01/12/2017  . Orthostatic dizziness 01/12/2017  . Diplopia 01/12/2017  . First degree atrioventricular block 03/29/2013  . Hypercholesteremia    . Goiter     Orientation RESPIRATION BLADDER Height & Weight     Self,Place  Normal Indwelling catheter Weight: 78 kg (from previous chart) Height:  6' (182.9 cm) (from previous charting)  BEHAVIORAL SYMPTOMS/MOOD NEUROLOGICAL BOWEL NUTRITION STATUS      Continent Diet (dysphagia 1)  AMBULATORY STATUS COMMUNICATION OF NEEDS Skin   Limited Assist Verbally Normal                       Personal Care Assistance Level of Assistance  Bathing,Feeding,Dressing Bathing Assistance: Limited assistance Feeding assistance: Limited assistance Dressing Assistance: Limited assistance     Functional Limitations Info  Sight,Speech,Hearing Sight Info: Impaired (eyeglasses) Hearing Info: Impaired (bilateral hearing aids) Speech Info: Adequate    SPECIAL CARE FACTORS FREQUENCY  PT (By licensed PT),OT (By licensed OT)     PT Frequency: 5x week OT Frequency: 5x week            Contractures Contractures Info: Not present    Additional Factors Info  Code Status,Allergies,Psychotropic Code Status Info:  (Full) Allergies Info:  (NKA) Psychotropic Info:  (xanax,haldol,trazadone-see MAR)         Current Medications (11/25/2020):  This is the current hospital active medication list Current Facility-Administered Medications  Medication Dose Route Frequency Provider Last Rate Last Admin  .  stroke: mapping our early stages of recovery book   Does not apply Once Marylyn Ishihara, Tyrone A, DO      . 0.9 %  sodium chloride infusion   Intravenous Continuous Kyle, Tyrone A, DO 75 mL/hr at 11/25/20 0630 New Bag at 11/25/20  0630  . acetaminophen (TYLENOL) tablet 650 mg  650 mg Oral Q4H PRN Marylyn Ishihara, Tyrone A, DO   650 mg at 11/22/20 2317   Or  . acetaminophen (TYLENOL) 160 MG/5ML solution 650 mg  650 mg Per Tube Q4H PRN Marylyn Ishihara, Tyrone A, DO       Or  . acetaminophen (TYLENOL) suppository 650 mg  650 mg Rectal Q4H PRN Marylyn Ishihara, Tyrone A, DO      . ALPRAZolam Duanne Moron) tablet 0.25 mg  0.25 mg Oral BID Shahmehdi,  Seyed A, MD   0.25 mg at 11/25/20 0850  . amLODipine (NORVASC) tablet 5 mg  5 mg Oral Daily Shahmehdi, Seyed A, MD   5 mg at 11/25/20 0851  . cefTRIAXone (ROCEPHIN) 2 g in sodium chloride 0.9 % 100 mL IVPB  2 g Intravenous Q24H Kyle, Tyrone A, DO 200 mL/hr at 11/24/20 1412 2 g at 11/24/20 1412  . Chlorhexidine Gluconate Cloth 2 % PADS 6 each  6 each Topical Daily Kyle, Tyrone A, DO   6 each at 11/24/20 1003  . glycopyrrolate (ROBINUL) tablet 1 mg  1 mg Oral TID Skipper Cliche A, MD   1 mg at 11/25/20 0851  . haloperidol lactate (HALDOL) injection 2 mg  2 mg Intravenous Q6H PRN Skipper Cliche A, MD   2 mg at 11/24/20 0011  . morphine 2 MG/ML injection 1 mg  1 mg Intravenous Q4H PRN Skipper Cliche A, MD   1 mg at 11/24/20 1242  . polyvinyl alcohol (LIQUIFILM TEARS) 1.4 % ophthalmic solution 1 drop  1 drop Both Eyes PRN Marylyn Ishihara, Tyrone A, DO      . senna-docusate (Senokot-S) tablet 1 tablet  1 tablet Oral QHS PRN Marylyn Ishihara, Tyrone A, DO      . traZODone (DESYREL) tablet 50 mg  50 mg Oral QHS Skipper Cliche A, MD   50 mg at 11/24/20 2237     Discharge Medications: Please see discharge summary for a list of discharge medications.  Relevant Imaging Results:  Relevant Lab Results:   Additional Information ss#[id number]  Mahabir, Juliann Pulse, RN

## 2020-11-25 NOTE — Discharge Summary (Signed)
Physician Discharge Summary [facility]    Patient: Jeffery Rogers                   Admit date: 11/21/2020   DOB: 1931-10-17             Discharge date:11/25/2020/11:36 AM PPJ:[id number]                          PCP: [name], MD  Disposition: SNF - [place]-  Recommendations for Outpatient Follow-up:  s  Follow up: in 1 day-  Palliative care, may progress to hospice-comfort care measure Discharge Condition: Stable   Code Status:   Code Status: Prior  Diet recommendation: Regular healthy diet   Discharge Diagnoses:    Principal Problem:   AMS (altered mental status) Active Problems:   Hematuria   Acute lower UTI   Essential tremor   Elevated CPK   S/P TAVR (transcatheter aortic valve replacement)   Persistent atrial fibrillation ([place])   Memory loss   Dementia with behavioral disturbance ([place])   Late onset Alzheimer's disease with behavioral disturbance ([place])   Confusion   Chronic indwelling Foley catheter   Generalized weakness   History of Present Illness/ Hospital Course Jeffery Rogers Summary:     Jeffery Rogers a 87 y.o.malewith medical history significant ofcomplete heart block s/p pacer, a fib, HLD, dementia.History is from son at bedside as patient has dementia and is a poor historian. His son reports that at the beginning of January, the patient was able to perform all his ADLs and walk on his own. Over the last three weeks or so, he's had a steady decline and mentation and function. The son has noticed that he has increased weakness in his legs. He is now unable to walk on his own. He is unable to perform his ADLs. He is more confused and less interactive. He has been to his neurologist who did a [place]. It was negative for acute stroke. He has been seen by urology for UTI and the ED for abdominal pain. He was started on abx for a presumed UTI. However, his overall functional status has not improved. His son felt it was time to return to  the ED for assistance.  ED Course:No acute findings on CTA PE or CT ab/pelvis. He had a brief episode of hypoxia that has since resolved. There was concern for stroke. The EDP spoke with neurology. Recommended MRI.     Altered mental status / Hx of dementia -with behavior changes agitation, confusion -Remained awake, very confused, agitated -he seems to respond very well to his home dose of Xanax  -Family concurs that he is declining mentally and physically since his hospitalization this January -On admission he is A&O x 1 (which is his baseline according to his son);      -This morning he is not following any commands, wife states he was agitated earlier this morning     -Family reports he does well with his  Po Xanax --was resumed     -Patient has been seen and following neurology regarding continued mental decline, poor cognition              -MRI of the brain: Reviewed -no acute changes or infarct old occipital infarct, significant microvascular changes, atrophy             -MRI spine reviewed -  L4-5 where there is moderate central canal stenosis and mild  narrowing in the right subarticular recess. Mild bilateral foraminal narrowing       -Due to multiple comorbidities, and progressive mental and physical decline prognosis remains              Poor --his wife and son is aware, agreed with palliative care consultation             -Confirmed DNR/DNI status, no heroic measures to be taken - could be a progression of his previous dementia or a slow recovery from the insult of infection - apparently his dementia meds were held recently by neurology  UTI/frank acute hematuria /chronic urinary retention -indwelling catheter since January 22 -Hematuria improving, Foley cath in place, urine becoming more clear  -Patient's son reports that previous cultures in urology office was positive for morganella - EDP spoke with urology; they stated it was reasonable to  switch to IV abx to help and re-culture             -He has been restarted on IV antibiotics of Rocephin  - per EDP, urology not planning acute intervention on hematuria - was on IV Abx --- rocephin x 4 days  check urine culture >> no growth to date - maintain foley     -Last hemoglobin 13.3, would not pursue any further checks,  Discontinued his Eliquis -due to ongoing hematuria   Severe progressive generalized weakness, BLE weakness/numbness Dysphagia? -He is not very cooperative in exam, somnolent, withdrawn, minimally verbal and physical interactive -No changes...  - CTH negative for acute stroke on 2/17 - spoke with neurology PA present at bedside, Re: Repeat MRI; believes MRI -MRI T-spine, reviewed, consistent with chronic changes -Consulting SLP eval >> 1 awake speech recommended dysphagia 1, 10 liquid soft solids finger - PT/OT >> progressive generalized weakness recommending SNF -Per speech recommendation...   Afib - hold eliquis d/t hematuria  Failure to thrive /progressive decline -Advanced dementia superimposed by infection -resulting to poor p.o. intake, progressive decline, failure to thrive - palliative care consult --we are recommending comfort care measures, if no improvement likely hospice home  HTN -BP stable -P.o. Norvasc if he could tolerate             - hold home medications due to poor p.o. intake,   Elevated troponin - trp flat, EKG w/ jxnal rhythm, denies CP; follow symptoms             -Comfortable at this time, in no distress, no signs of chest pain  Elevated CPK -546 -S/p  gentle IV fluid, will monitor  Severe debility continue decline mentally and physically Prognosis very poor due to multiple comorbidities, dementia and decline mental and physical status Goal of care -discussed with patient is wife and son at bedside Palliative care consulted -we are recommending comfort care  measures,.. If continued decline possibly hospice home ... Family is agreeable to the plan, agreed to no heroic measures Possibly to treat reversible causes.     Code Status:DNR/DNI Family Communication: With patient's son and wife at bedside-discussed in detail Consults called:Neurology  Dispo: The patient is from:Home Anticipated d/c is to:SNF --- palliative --- comfort care-we will need hospice consultation     Discharge Instructions:   Discharge Instructions    Activity as tolerated - No restrictions   Complete by: As directed    Diet - low sodium heart healthy   Complete by: As directed    Discharge instructions   Complete by: As directed    Follow with  palliative care, may progress to hospice, comfort care   Increase activity slowly   Complete by: As directed        Medication List    STOP taking these medications   apixaban 5 MG Tabs tablet Commonly known as: Eliquis   ARIPiprazole 10 MG tablet Commonly known as: Abilify   atorvastatin 40 MG tablet Commonly known as: LIPITOR   donepezil 10 MG tablet Commonly known as: ARICEPT   memantine 10 MG tablet Commonly known as: NAMENDA   Metanx [id number] MG Caps     TAKE these medications   acetaminophen 500 MG tablet Commonly known as: TYLENOL Take 500-1,000 mg by mouth every 6 (six) hours as needed for mild pain, fever or headache.   ALPRAZolam 0.25 MG tablet Commonly known as: XANAX Take 0.25 mg by mouth 3 (three) times daily as needed for anxiety.   amLODipine 5 MG tablet Commonly known as: NORVASC Take 1 tablet by mouth daily. What changed: how much to take   ipratropium 0.03 % nasal spray Commonly known as: ATROVENT Place 2 sprays into both nostrils 4 (four) times daily as needed for rhinitis.   meloxicam 7.5 MG tablet Commonly known as: MOBIC Take 7.5 mg by mouth 2 (two) times daily.   mirtazapine 7.5 MG tablet Commonly known as: REMERON Take 7.5 mg by mouth  daily.   oxyCODONE-acetaminophen 5-325 MG tablet Commonly known as: Percocet Take 1-2 tablets by mouth every 6 (six) hours as needed. What changed: reasons to take this   polyvinyl alcohol 1.4 % ophthalmic solution Commonly known as: LIQUIFILM TEARS Place 1 drop into both eyes as needed for dry eyes.   QUEtiapine 50 MG tablet Commonly known as: SEROQUEL Take 2 tablets (100 mg total) by mouth at bedtime.   sulfamethoxazole-trimethoprim 800-160 MG tablet Commonly known as: BACTRIM DS Take 1 tablet by mouth 2 (two) times daily.       No Known Allergies   Procedures /Studies:   DG Chest 2 View  Result Date: 11/13/2020 CLINICAL DATA:  Right upper back pain. EXAM: CHEST - 2 VIEW COMPARISON:  Chest x-ray dated April 16, 2019. FINDINGS: Unchanged left chest wall pacemaker. Interval enlargement of the cardiopericardial silhouette with a globular appearance. Prior TAVR. Normal pulmonary vascularity. No focal consolidation, pleural effusion, or pneumothorax. No acute osseous abnormality. IMPRESSION: 1. Interval enlargement of the cardiopericardial silhouette with a globular appearance, raising concern for pericardial effusion. Electronically Signed   By: Titus Dubin M.D.   On: 11/13/2020 19:55   CT HEAD WO CONTRAST  Result Date: 11/21/2020 CLINICAL DATA:  87 year old male with progressive altered mental status, generalized weakness. This exam originally designated as [facility] to report, however, the [facility] Emergency Department contacted me by phone requesting urgent interpretation. EXAM: CT HEAD WITHOUT CONTRAST TECHNIQUE: Contiguous axial images were obtained from the base of the skull through the vertex without intravenous contrast. COMPARISON:  Head CT 01/14/2017. FINDINGS: Brain: Encephalomalacia at the left occipital pole is new since 2018 and associated with mild interval enlargement of the left occipital horn. Patchy bilateral cerebral white matter hypodensity  has progressed since 2018, most notably the posterior limb right internal capsule on series 2, image 19. No midline shift, ventriculomegaly, mass effect, evidence of mass lesion, intracranial hemorrhage or evidence of cortically based acute infarction. Vascular: Calcified atherosclerosis at the skull base. Evidence of a small unruptured 6-7 mm basilar tip aneurysm or focal vessel ectasia (coronal image 38), probably not significantly changed since 2018. No suspicious  intracranial vascular hyperdensity. Skull: No acute osseous abnormality identified. Sinuses/Orbits: Chronic right maxillary sinusitis with mucoperiosteal thickening and inspissated material. Other Visualized paranasal sinuses and mastoids are clear. Other: No acute orbit or scalp soft tissue finding identified. IMPRESSION: 1. No acute intracranial abnormality by CT. - small Left PCA infarct at the left occipital pole is new since 2018 but appears chronic. - Progressed cerebral white matter disease since 2018, most likely due to small vessel ischemia. 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 2. Chronic right maxillary sinusitis. The above was discussed by telephone on 11/21/2020 at 1019 hours with ED provider Georga Kaufmann. Electronically Signed   By: Genevie Ann M.D.   On: 11/21/2020 10:32   CT Angio Chest PE W and/or Wo Contrast  Result Date: 11/21/2020 CLINICAL DATA:  Chest and left flank pain. Hypoxia and altered mental status. EXAM: CT ANGIOGRAPHY CHEST CT ABDOMEN AND PELVIS WITH CONTRAST TECHNIQUE: Multidetector CT imaging of the chest was performed using the standard protocol during bolus administration of intravenous contrast. Multiplanar CT image reconstructions and MIPs were obtained to evaluate the vascular anatomy. Multidetector CT imaging of the abdomen and pelvis was performed using the standard protocol during bolus administration of intravenous contrast. CONTRAST:  177mL OMNIPAQUE IOHEXOL 350 MG/ML  SOLN COMPARISON:  CT a chest, abdomen, and pelvis dated November 13, 2020. FINDINGS: CTA CHEST FINDINGS Cardiovascular: Satisfactory opacification of the pulmonary arteries to the segmental level. No evidence of pulmonary embolism. Stable cardiomegaly status post TAVR. No pericardial effusion. Unchanged mild aneurysmal dilatation of the mid ascending thoracic aorta measuring up to 4.0 cm. Coronary, aortic arch, and branch vessel atherosclerotic vascular disease. Unchanged left chest wall pacemaker. Mediastinum/Nodes: No enlarged mediastinal, hilar, or axillary lymph nodes. Unchanged 2.0 cm partially calcified hypodense nodule in the right thyroid lobe This has been evaluated on previous imaging. The trachea and esophagus demonstrate no significant findings. Lungs/Pleura: Minimal dependent subsegmental atelectasis in both lower lobes. No focal consolidation, pleural effusion, or pneumothorax. No suspicious pulmonary nodule. Biapical pleuroparenchymal scarring, greater on the right. Musculoskeletal: No chest wall abnormality. No acute or significant osseous findings. Review of the MIP images confirms the above findings. CT ABDOMEN AND PELVIS FINDINGS Hepatobiliary: No focal liver abnormality. Unchanged gallstone. No gallbladder wall thickening or biliary dilatation. Probable adenomyomatosis at the gallbladder fundus. Pancreas: Unremarkable. No pancreatic ductal dilatation or surrounding inflammatory changes. Spleen: Normal in size without focal abnormality. Adrenals/Urinary Tract: Adrenal glands are unremarkable. Unchanged bilateral renal cysts. No renal calculi or hydronephrosis. The bladder is decompressed by Foley catheter. Stomach/Bowel: Stomach is within normal limits. Appendix appears normal. No evidence of bowel wall thickening, distention, or inflammatory changes. Redundant sigmoid colon. Vascular/Lymphatic: Aortic atherosclerosis. No enlarged abdominal or pelvic lymph nodes. Reproductive: Unchanged mild  prostatomegaly. Other: No abdominal wall hernia or abnormality. No abdominopelvic ascites. No pneumoperitoneum. Musculoskeletal: No acute or significant osseous findings. Review of the MIP images confirms the above findings. IMPRESSION: Chest: 1. No evidence of pulmonary embolism. No acute intrathoracic process. 2. Unchanged mild aneurysmal dilatation of the mid ascending thoracic aorta measuring up to 4.0 cm. Recommend annual imaging followup by CTA or MRA. This recommendation follows 2010 ACCF/AHA/AATS/ACR/ASA/SCA/SCAI/SIR/STS/SVM Guidelines for the Diagnosis and Management of Patients with Thoracic Aortic Disease. Circulation. 2010; 121: X517-G017. Aortic aneurysm NOS (ICD10-I71.9) 3.  Aortic atherosclerosis (ICD10-I70.0). Abdomen and pelvis: 1. No acute intra-abdominal process. 2. Unchanged cholelithiasis. Electronically Signed   By: Titus Dubin M.D.   On: 11/21/2020 13:07  MR BRAIN WO CONTRAST  Result Date: 11/21/2020 CLINICAL DATA:  Dementia.  Recent worsening.  Weakness. EXAM: MRI HEAD WITHOUT CONTRAST TECHNIQUE: Multiplanar, multiecho pulse sequences of the brain and surrounding structures were obtained without intravenous contrast. COMPARISON:  Head CT yesterday. FINDINGS: Brain: Diffusion imaging does not show any acute or subacute infarction. Chronic small-vessel ischemic changes affect the pons. Few old small vessel cerebellar infarctions. Cerebral hemispheres show generalized atrophy, possibly with temporal lobe predominance. There is extensive chronic small-vessel ischemic change of the hemispheric white matter. 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 No evidence of mass lesion, hemorrhage, hydrocephalus or extra-axial collection. Vascular: Major vessels at the base of the brain show flow. Ectasia of the basilar tip as seen previously with maximal diameter 6.4 mm. Skull and upper cervical spine: Negative Sinuses/Orbits: Rhinosinusitis of the right maxillary sinus. Orbits negative. Other: None  IMPRESSION: 1. No acute or reversible finding. Generalized atrophy, possibly with some temporal lobe predominance. Extensive chronic small-vessel ischemic changes throughout the brain. 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 2. Ectasia of the basilar tip as seen previously with maximal diameter 6.4 mm. Electronically Signed   By: Nelson Chimes M.D.   On: 11/21/2020 19:15   MR LUMBAR SPINE WO CONTRAST  Result Date: 11/21/2020 CLINICAL DATA:  Increasing leg weakness developing over the past 3-4 weeks. EXAM: MRI LUMBAR SPINE WITHOUT CONTRAST TECHNIQUE: Multiplanar, multisequence MR imaging of the lumbar spine was performed. No intravenous contrast was administered. COMPARISON:  MRI lumbar spine 06/17/2012. FINDINGS: Segmentation:  Standard. Alignment:  No listhesis. Vertebrae: No fracture, evidence of discitis, or bone lesion. Degenerative endplate signal change Z6-1 and L5-S1 has progressed since the prior exam. Conus medullaris and cauda equina: Conus extends to the L1 level. Conus and cauda equina appear normal. Paraspinal and other soft tissues: T2 hyperintense lesion in the right kidney is most consistent with a cyst. Disc levels: T12-L1: Negative. L1-2: Negative. L2-3: Negative. L3-4: Shallow disc bulge and mild facet degenerative change. No stenosis. L4-5: There has been progression of spondylosis at this level. Loss of disc space height with a disc bulge, ligamentum flavum thickening and moderate facet arthropathy are seen. There is moderate central canal stenosis and mild narrowing in the right subarticular recess. Mild foraminal narrowing is more notable on the right. L5-S1: There has been some progression of disease at this level. Loss of disc space height with a bulge eccentric to the left. The central canal is open. Mild to moderate bilateral foraminal narrowing. IMPRESSION: Some progression of spondylosis at L4-5 where there is moderate central canal stenosis and mild narrowing in the right  subarticular recess. Mild bilateral foraminal narrowing is more notable on the right at this level. Some progression of spondylosis at L5-S1 where there is mild to moderate bilateral foraminal narrowing. The central canal is patent. Electronically Signed   By: Inge Rise M.D.   On: 11/21/2020 19:20   CT ABDOMEN PELVIS W CONTRAST  Result Date: 11/21/2020 CLINICAL DATA:  Chest and left flank pain. Hypoxia and altered mental status. EXAM: CT ANGIOGRAPHY CHEST CT ABDOMEN AND PELVIS WITH CONTRAST TECHNIQUE: Multidetector CT imaging of the chest was performed using the standard protocol during bolus administration of intravenous contrast. Multiplanar CT image reconstructions and MIPs were obtained to evaluate the vascular anatomy. Multidetector CT imaging of the abdomen and pelvis was performed using the standard protocol during bolus administration of intravenous contrast. CONTRAST:  133mL OMNIPAQUE IOHEXOL 350 MG/ML SOLN COMPARISON:  CT a chest, abdomen, and pelvis dated November 13, 2020. FINDINGS:  CTA CHEST FINDINGS Cardiovascular: Satisfactory opacification of the pulmonary arteries to the segmental level. No evidence of pulmonary embolism. Stable cardiomegaly status post TAVR. No pericardial effusion. Unchanged mild aneurysmal dilatation of the mid ascending thoracic aorta measuring up to 4.0 cm. Coronary, aortic arch, and branch vessel atherosclerotic vascular disease. Unchanged left chest wall pacemaker. Mediastinum/Nodes: No enlarged mediastinal, hilar, or axillary lymph nodes. Unchanged 2.0 cm partially calcified hypodense nodule in the right thyroid lobe This has been evaluated on previous imaging. The trachea and esophagus demonstrate no significant findings. Lungs/Pleura: Minimal dependent subsegmental atelectasis in both lower lobes. No focal consolidation, pleural effusion, or pneumothorax. No suspicious pulmonary nodule. Biapical pleuroparenchymal scarring, greater on the right. Musculoskeletal:  No chest wall abnormality. No acute or significant osseous findings. Review of the MIP images confirms the above findings. CT ABDOMEN AND PELVIS FINDINGS Hepatobiliary: No focal liver abnormality. Unchanged gallstone. No gallbladder wall thickening or biliary dilatation. Probable adenomyomatosis at the gallbladder fundus. Pancreas: Unremarkable. No pancreatic ductal dilatation or surrounding inflammatory changes. Spleen: Normal in size without focal abnormality. Adrenals/Urinary Tract: Adrenal glands are unremarkable. Unchanged bilateral renal cysts. No renal calculi or hydronephrosis. The bladder is decompressed by Foley catheter. Stomach/Bowel: Stomach is within normal limits. Appendix appears normal. No evidence of bowel wall thickening, distention, or inflammatory changes. Redundant sigmoid colon. Vascular/Lymphatic: Aortic atherosclerosis. No enlarged abdominal or pelvic lymph nodes. Reproductive: Unchanged mild prostatomegaly. Other: No abdominal wall hernia or abnormality. No abdominopelvic ascites. No pneumoperitoneum. Musculoskeletal: No acute or significant osseous findings. Review of the MIP images confirms the above findings. IMPRESSION: Chest: 1. No evidence of pulmonary embolism. No acute intrathoracic process. 2. Unchanged mild aneurysmal dilatation of the mid ascending thoracic aorta measuring up to 4.0 cm. Recommend annual imaging followup by CTA or MRA. This recommendation follows 2010 ACCF/AHA/AATS/ACR/ASA/SCA/SCAI/SIR/STS/SVM Guidelines for the Diagnosis and Management of Patients with Thoracic Aortic Disease. Circulation. 2010; 121: G315-V761. Aortic aneurysm NOS (ICD10-I71.9) 3.  Aortic atherosclerosis (ICD10-I70.0). Abdomen and pelvis: 1. No acute intra-abdominal process. 2. Unchanged cholelithiasis. Electronically Signed   By: Titus Dubin M.D.   On: 11/21/2020 13:07   DG Chest Portable 1 View  Result Date: 11/21/2020 CLINICAL DATA:  87 year old male with history of altered mental  status. Weakness. EXAM: PORTABLE CHEST 1 VIEW COMPARISON:  Chest x-ray 11/13/2020. FINDINGS: Lung volumes are normal. No consolidative airspace disease. No pleural effusions. No suspicious appearing pulmonary nodules or masses are noted. No pneumothorax. No evidence of pulmonary edema. Mild cardiomegaly. Upper mediastinal contours are within normal limits. Atherosclerotic calcifications in the thoracic aorta. Status post TAVR. Left-sided pacemaker device in place with lead tips projecting over the expected location of the right atrium and right ventricle. IMPRESSION: 1. No radiographic evidence of acute cardiopulmonary disease. 2. Mild cardiomegaly. 3. Aortic atherosclerosis. Electronically Signed   By: Vinnie Langton M.D.   On: 11/21/2020 10:54   ECHOCARDIOGRAM COMPLETE  Result Date: 11/07/2020    ECHOCARDIOGRAM REPORT   Patient Name:   DORMAN CALDERWOOD Date of Exam: 11/07/2020 Medical Rec #:  [id number]       Height:       70.0 in Accession #:    [id number]      Weight:       169.0 lb Date of Birth:  [birth year]        BSA:          1.943 m Patient Age:    36 years        BP:  134/76 mmHg Patient Gender: M               HR:           60 bpm. Exam Location:  [address] Procedure: 2D Echo, Cardiac Doppler and Color Doppler Indications:    Z95.2 TAVR  History:        Patient has prior history of Echocardiogram examinations, most                 recent 11/20/2019. Pacemaker, S/p TAVR (26mm Berniece Pap),                 Arrythmias:Atrial Fibrillation, Signs/Symptoms:Dilated aortic                 root; Risk Factors:Former Smoker and Dyslipidemia.  Sonographer:    Coralyn Helling RDCS Referring Phys: [id number] [place]  Sonographer Comments: Technically difficult study due to poor echo windows. Image acquisition challenging due to patient body habitus. IMPRESSIONS  1. Left ventricular ejection fraction, by estimation, is 55 to 60%. The left ventricle has normal function. The left ventricle has no  regional wall motion abnormalities. There is mild concentric left ventricular hypertrophy. Left ventricular diastolic parameters are indeterminate.  2. Right ventricular systolic function is mildly reduced. The right ventricular size is severely enlarged.  3. Left atrial size was severely dilated.  4. Right atrial size was severely dilated.  5. The mitral valve is degenerative. Mild to moderate mitral valve regurgitation. Severe mitral annular calcification.  6. Tricuspid valve regurgitation is moderate, eccentric and appears to have two jets. Eccentricity may underestimate severity.  7. The aortic valve has been repaired/replaced. Aortic valve regurgitation is mild to moderate. Procedure Date: 05/30/2018. Aortic valve mean gradient measures 16.8 mmHg. Aortic valve Vmax measures 2.88 m/s. Mild to moderate paralvalular leak seen in the  PSAX at the 2-3 O'clock position with small additional jet at the 9 o'clock position. DVI 0.43, with prosthetic aortic valve acceleration time < 100 ms.  8. Aortic dilatation noted. There is mild dilatation of the ascending aorta, measuring 40 mm. Comparison(s): A prior study was performed on 11/20/2019. Prior images reviewed side by side. Compared to prior similar paravalvular leak, prosthetic valve measures, and ascending aorta dilation. Right ventricle has dilated further from last study. FINDINGS  Left Ventricle: Left ventricular ejection fraction, by estimation, is 55 to 60%. The left ventricle has normal function. The left ventricle has no regional wall motion abnormalities. The left ventricular internal cavity size was normal in size. There is  mild concentric left ventricular hypertrophy. Left ventricular diastolic parameters are indeterminate. Right Ventricle: The right ventricular size is severely enlarged. No increase in right ventricular wall thickness. Right ventricular systolic function is mildly reduced. Left Atrium: Left atrial size was severely dilated. Right Atrium:  Right atrial size was severely dilated. Pericardium: There is no evidence of pericardial effusion. Mitral Valve: The mitral valve is degenerative in appearance. Severe mitral annular calcification. Mild to moderate mitral valve regurgitation. Tricuspid Valve: The tricuspid valve is normal in structure. Tricuspid valve regurgitation is moderate. Aortic Valve: Mild to moderate paralvalular leak seen in the PSAX at the 2-3 O'clock position with small additional jet at the 9 o'clock position. DVI 0.43, with prosthetic aortic valve acceleration time < 100 ms. The aortic valve has been repaired/replaced. Aortic valve regurgitation is mild to moderate. Aortic regurgitation PHT measures 480 msec. Aortic valve mean gradient measures 16.8 mmHg. Aortic valve peak gradient measures 33.1 mmHg. Aortic valve area, by  VTI measures 1.34 cm. There is a 23 mm Sapien prosthetic, stented (TAVR) valve present in the aortic position. Pulmonic Valve: The pulmonic valve was grossly normal. Pulmonic valve regurgitation is mild. No evidence of pulmonic stenosis. Aorta: Aortic dilatation noted and the aortic root is normal in size and structure. There is mild dilatation of the ascending aorta, measuring 40 mm. IAS/Shunts: The atrial septum is grossly normal. Additional Comments: A pacer wire is visualized.  LEFT VENTRICLE PLAX 2D LVIDd:         4.30 cm  Diastology LVIDs:         2.60 cm  LV e' medial:    5.44 cm/s LV PW:         1.20 cm  LV E/e' medial:  33.5 LV IVS:        1.10 cm  LV e' lateral:   6.96 cm/s LVOT diam:     2.00 cm  LV E/e' lateral: 26.1 LV SV:         76 LV SV Index:   39 LVOT Area:     3.14 cm  RIGHT VENTRICLE            IVC RV S prime:     9.36 cm/s  IVC diam: 1.30 cm TAPSE (M-mode): 1.6 cm RVSP:           32.2 mmHg LEFT ATRIUM              Index       RIGHT ATRIUM           Index LA diam:        4.80 cm  2.47 cm/m  RA Pressure: 3.00 mmHg LA Vol (A2C):   106.0 ml 54.55 ml/m RA Area:     37.80 cm LA Vol (A4C):   122.0  ml 62.79 ml/m RA Volume:   167.00 ml 85.94 ml/m LA Biplane Vol: 115.0 ml 59.18 ml/m  AORTIC VALVE AV Area (Vmax):    1.27 cm AV Area (Vmean):   1.35 cm AV Area (VTI):     1.34 cm AV Vmax:           287.75 cm/s AV Vmean:          187.500 cm/s AV VTI:            0.572 m AV Peak Grad:      33.1 mmHg AV Mean Grad:      16.8 mmHg LVOT Vmax:         116.00 cm/s LVOT Vmean:        80.700 cm/s LVOT VTI:          0.243 m LVOT/AV VTI ratio: 0.43 AI PHT:            480 msec  AORTA Ao Root diam: 3.20 cm Ao Asc diam:  4.00 cm MV E velocity: 182.00 cm/s  TRICUSPID VALVE MV A velocity: 84.80 cm/s   TR Peak grad:   29.2 mmHg MV E/A ratio:  2.15         TR Vmax:        270.00 cm/s                             Estimated RAP:  3.00 mmHg                             RVSP:  32.2 mmHg                              SHUNTS                             Systemic VTI:  0.24 m                             Systemic Diam: 2.00 cm Rudean Haskell MD Electronically signed by Rudean Haskell MD Signature Date/Time: 11/07/2020/4:04:10 PM    Final    CT Angio Chest/Abd/Pel for Dissection W and/or W/WO  Result Date: 11/13/2020 CLINICAL DATA:  Aortic dissection suspected, right flank pain for 4 days, recent UTI, history of dementia EXAM: CT ANGIOGRAPHY CHEST, ABDOMEN AND PELVIS TECHNIQUE: Non-contrast CT of the chest was initially obtained. Multidetector CT imaging through the chest, abdomen and pelvis was performed using the standard protocol during bolus administration of intravenous contrast. Multiplanar reconstructed images and MIPs were obtained and reviewed to evaluate the vascular anatomy. CONTRAST:  131mL OMNIPAQUE IOHEXOL 350 MG/ML SOLN COMPARISON:  Radiograph 11/13/2020, CT 05/16/2018 thyroid ultrasound 12/22/2012 FINDINGS: CTA CHEST FINDINGS Cardiovascular: Noncontrast CT images of the chest were obtained initially. Images depict a normal caliber thoracic aorta mild dilatation of the ascending thoracic aorta to 4 cm,  not significantly changed from comparison imaging in 2019. Atherosclerotic plaque within the normal caliber aorta. No hyperdense mural thickening or plaque displacement to suggest intramural hematoma. Postcontrast administration there is satisfactory opacification of the arterial vasculature. No acute luminal abnormality of the imaged aorta. No periaortic stranding or hemorrhage. Normal 3 vessel branching of the aortic arch. Proximal great vessels mildly calcified and tortuous but otherwise unremarkable. No significant flow-limiting stenosis or occlusion is seen. Central pulmonary arteries are borderline enlarged. No large central, lobar or proximal segmental filling defects are seen within limitations of this non tailored examination of the pulmonary arteries. Redemonstration of cardiomegaly with predominantly right atrioventricular enlargement. A left chest wall battery pack is in position with pacer leads terminating at the right atrium and cardiac apex. There is dense calcification of mitral annulus. Prior transcatheter aortic valve replacement is noted, in expected positioning. Extensive coronary artery calcifications are seen. No pericardial effusion. Slight reflux of contrast into the hepatic veins and IVC. No major venous abnormalities within the limitations of this arterial phase exam. Mediastinum/Nodes: No mediastinal fluid or gas. Stable appearance of a 2 cm hypoattenuating the partially calcified right posterior thyroid nodule. Thoracic inlet is otherwise unremarkable. No acute abnormality of the trachea or esophagus. No worrisome mediastinal, hilar or axillary adenopathy. Lungs/Pleura: Chronic region of of focal subpleural scarring and architectural distortion in the right upper lobe. More mild biapical pleuroparenchymal scarring is present as well. Some dependent atelectasis is seen posteriorly. Stable calcified perifissural nodule measuring 2 mm in the right middle lobe. No known new or concerning  pulmonary nodules or masses are seen. Musculoskeletal: The osseous structures appear diffusely demineralized which may limit detection of small or nondisplaced fractures. No acute osseous abnormality or suspicious osseous lesion. Levocurvature of the lower thoracic spine, apex T8-9 with exaggerated thoracic kyphosis is similar to comparison exam. Additional degenerative changes in the shoulders and sternoclavicular joints. No worrisome chest wall masses or lesions. Left chest wall battery pack, as above. Mild bilateral gynecomastia. Review of the MIP images confirms the above findings. CTA ABDOMEN AND PELVIS  FINDINGS VASCULAR Aorta: Calcified and noncalcified atheromatous plaque throughout the abdominal aorta. No flow-limiting stenosis or occlusion is seen. No acute luminal abnormality. No periaortic stranding or hemorrhage. Celiac: Widely patent ostium, normal opacification. Minimal plaque in the proximal branches, most pronounced in the splenic artery, without significant flow-limiting stenosis or occlusion. No aneurysm or ectasia. No acute luminal abnormality. SMA: Mild ostial plaque narrowing. Otherwise patent without evidence of aneurysm, dissection, vasculitis or other significant stenosis. Renals: Single renal arteries bilaterally. Mild bilateral ostial plaque narrowing, left slightly greater than right. Vessels are otherwise normally opacified without acute luminal abnormality, features of vasculitis or fibromuscular dysplasia. IMA: Mild-to-moderate ostial plaque narrowing. Otherwise normally opacified. No evidence of aneurysm, dissection or vasculitis. Inflow: Calcification of the inflow vasculature predominantly within the common and internal iliac arteries. Mild ectasia of the internal iliac just proximal to the bifurcation of the anterior and posterior divisions. No other acute vascular abnormality is seen in the inflow vessels. No acute luminal abnormality. No high-grade stenosis or occlusions. Limited  visualization of the proximal outflow with calcified plaque in the bilateral common and superficial femoral arteries. Widely patent origins of the bilateral profundus femora S arteries. Veins: Reflux of contrast into the hepatic veins and IVC. No other major venous abnormality within limitations of this arterial phase examination. Review of the MIP images confirms the above findings. NON-VASCULAR Hepatobiliary: No worrisome focal liver lesions. Smooth liver surface contour. Normal hepatic attenuation. Gallbladder contains a dependently layering calcified 11 mm gallstone. There is some additional hyperdense thickening towards the gallbladder fundus which could reflect inflammatory wall thickening versus adenomyomatosis. No other pericholecystic fluid or inflammation is seen. No biliary ductal dilatation or visible intraductal gallstones. Small air-filled duodenal diverticula near the ampulla. Pancreas: No pancreatic ductal dilatation or surrounding inflammatory changes. Spleen: Mild heterogeneity of the splenic enhancement is a typical finding in the arterial phase of imaging. Normal in size. No concerning splenic lesions. Adrenals/Urinary Tract: Kidneys enhance symmetrically and uniformly. Fluid attenuation cyst is seen in the interpolar left kidney measuring up to 3.2 cm and lower pole right kidney measuring up to 4.1 cm. No concerning internal complexity. No other concerning renal mass. No visible urolithiasis or hydronephrosis. Bladder decompressed by inflated Foley catheter. Mild bladder wall thickening perivesicular hazy stranding is nonspecific with few punctate foci of gas likely related to instrumentation. Stomach/Bowel: Distal esophagus, stomach and duodenal sweep are unremarkable. No small bowel wall thickening or dilatation. No evidence of obstruction. Normal air-filled appendix in the right lower quadrant. No colonic dilatation or wall thickening. Lymphatic: No suspicious or enlarged lymph nodes in the  included lymphatic chains. Reproductive: Mild prostatomegaly. Seminal vesicles are unremarkable. Other: No abdominopelvic free fluid or free gas. No bowel containing hernias. Mild body wall edema. Some focal skin thickening noted adjacent the greater trochanters, correlate with visual inspection. Musculoskeletal: The osseous structures appear diffusely demineralized which may limit detection of small or nondisplaced fractures. No acute osseous abnormality or suspicious osseous lesion. Multilevel degenerative changes are present in the imaged portions of the spine. Features most pronounced L4, L5 to some mild retrolisthesis L5 on S1 and resulting moderate bilateral foraminal narrowing. Additional degenerative changes in the hips and pelvis. Review of the MIP images confirms the above findings. IMPRESSION: Vascular 1. No evidence of acute aortic syndrome. 2. Stable dilatation of the ascending thoracic aorta to 4 cm. Recommend annual imaging followup by CTA or MRA. This recommendation follows 2010 ACCF/AHA/AATS/ACR/ASA/SCA/SCAI/SIR/STS/SVM Guidelines for the Diagnosis and Management of Patients with Thoracic Aortic Disease. Circulation.  2010; 121: D741-O878. Aortic aneurysm NOS (ICD10-I71.9) 3. No evidence of pulmonary embolism. 4. Cardiomegaly with right atrioventricular enlargement. Reflux of contrast into the hepatic veins and IVC, suggestive of right heart dysfunction. Central pulmonary artery enlargement may reflect further elevation of the right sided pressures or chronic pulmonary artery hypertension. 5.  Aortic Atherosclerosis (ICD10-I70.0). 6. Mild to moderate ostial narrowing of the inferior mesenteric artery. Mild ostial narrowing of the bilateral renal arteries and SMA. 7. Mild ectasia of the left internal iliac, unchanged from prior Nonvascular 1. Foley catheter in place. Mild bladder wall thickening with perivesicular hazy stranding, correlate with urinalysis to exclude cystitis. No obstructive  urolithiasis or hydronephrosis is seen. 2. Cholelithiasis with mild gallbladder wall thickening versus adenomyomatosis at the gallbladder fundus. Correlate with right upper quadrant symptoms and consider further evaluation with right upper quadrant ultrasound. 3. Stable appearance of a 2 cm hypoattenuating the partially calcified right posterior thyroid nodule. This has been evaluated on previous imaging. (ref: J Am Coll Radiol. 2015 Feb;12(2): 143-50). 4. Stable regions of subpleural scarring and architectural distortion in the right upper lobe. Electronically Signed   By: Lovena Le M.D.   On: 11/13/2020 22:49   US Abdomen Limited RUQ (LIVER/GB)  Result Date: 11/14/2020 CLINICAL DATA:  Pain EXAM: ULTRASOUND ABDOMEN LIMITED RIGHT UPPER QUADRANT COMPARISON:  11/13/2020 FINDINGS: Gallbladder: The sonographic Percell Miller sign is negative. There are gallstones without significant gallbladder wall thickening or pericholecystic free fluid. Common bile duct: Diameter: 5 mm Liver: No focal lesion identified. Within normal limits in parenchymal echogenicity. Portal vein is patent on color Doppler imaging with normal direction of blood flow towards the liver. Other: None. IMPRESSION: There is cholelithiasis without secondary signs of acute cholecystitis. Electronically Signed   By: Constance Holster M.D.   On: 11/14/2020 00:08     Subjective:   Patient was seen and examined 11/25/2020, 11:36 AM Patient stable today. No acute distress.  No issues overnight Stable for discharge.  Discharge Exam:    Vitals:   11/24/20 1318 11/24/20 2115 11/25/20 0358 11/25/20 0851  BP: (!) 152/67 (!) 166/68 114/63 115/68  Pulse: 60 (!) 59 (!) 59   Resp: 16  14   Temp: (!) 97.3 F (36.3 C) 97.8 F (36.6 C) 97.6 F (36.4 C)   TempSrc:  Oral Oral   SpO2: 98% 98% 100%   Weight:      Height:        General: Pt lying comfortably in bed & appears in no obvious distress. Cardiovascular: S1 & S2 heard, RRR, S1/S2 +. No  murmurs, rubs, gallops or clicks. No JVD or pedal edema. Respiratory: Clear to auscultation without wheezing, rhonchi or crackles. No increased work of breathing. Abdominal:  Non-distended, non-tender & soft. No organomegaly or masses appreciated. Normal bowel sounds heard. CNS: Alert and oriented. No focal deficits. Extremities: no edema, no cyanosis      The results of significant diagnostics from this hospitalization (including imaging, microbiology, ancillary and laboratory) are listed below for reference.      Microbiology:   Recent Results (from the past 240 hour(s))  Resp Panel by RT-PCR (Flu A&B, Covid) Nasopharyngeal Swab     Status: None   Collection Time: 11/21/20 10:34 AM   Specimen: Nasopharyngeal Swab; Nasopharyngeal(NP) swabs in vial transport medium  Result Value Ref Range Status   SARS Coronavirus 2 by RT PCR NEGATIVE NEGATIVE Final    Comment: (NOTE) SARS-CoV-2 target nucleic acids are NOT DETECTED.  The SARS-CoV-2 RNA is generally detectable in  upper respiratory specimens during the acute phase of infection. The lowest concentration of SARS-CoV-2 viral copies this assay can detect is 138 copies/mL. A negative result does not preclude SARS-Cov-2 infection and should not be used as the sole basis for treatment or other patient management decisions. A negative result may occur with  improper specimen collection/handling, submission of specimen other than nasopharyngeal swab, presence of viral mutation(s) within the areas targeted by this assay, and inadequate number of viral copies(<138 copies/mL). A negative result must be combined with clinical observations, patient history, and epidemiological information. The expected result is Negative.  Fact Sheet for Patients:  EntrepreneurPulse.com.au  Fact Sheet for Healthcare Providers:  IncredibleEmployment.be  This test is no t yet approved or cleared by the [place] FDA  and  has been authorized for detection and/or diagnosis of SARS-CoV-2 by FDA under an Emergency Use Authorization (EUA). This EUA will remain  in effect (meaning this test can be used) for the duration of the COVID-19 declaration under Section 564(b)(1) of the Act, 21 U.S.C.section 360bbb-3(b)(1), unless the authorization is terminated  or revoked sooner.       Influenza A by PCR NEGATIVE NEGATIVE Final   Influenza B by PCR NEGATIVE NEGATIVE Final    Comment: (NOTE) The Xpert Xpress SARS-CoV-2/FLU/RSV plus assay is intended as an aid in the diagnosis of influenza from Nasopharyngeal swab specimens and should not be used as a sole basis for treatment. Nasal washings and aspirates are unacceptable for Xpert Xpress SARS-CoV-2/FLU/RSV testing.  Fact Sheet for Patients: EntrepreneurPulse.com.au  Fact Sheet for Healthcare Providers: IncredibleEmployment.be  This test is not yet approved or cleared by the [place] FDA and has been authorized for detection and/or diagnosis of SARS-CoV-2 by FDA under an Emergency Use Authorization (EUA). This EUA will remain in effect (meaning this test can be used) for the duration of the COVID-19 declaration under Section 564(b)(1) of the Act, 21 U.S.C. section 360bbb-3(b)(1), unless the authorization is terminated or revoked.  Performed at [facility], [place] [address], [place], [place] [address]   Culture, blood (routine x 2)     Status: None (Preliminary result)   Collection Time: 11/21/20 11:24 AM   Specimen: BLOOD RIGHT ARM  Result Value Ref Range Status   Specimen Description   Final    BLOOD RIGHT ARM Performed at [facility] [address], [place], [place] [address]    Special Requests   Final    BOTTLES DRAWN AEROBIC AND ANAEROBIC Blood Culture adequate volume Performed at [facility] [address] [place], [place] [address]     Culture   Final    NO GROWTH 4 DAYS Performed at [facility], [place] [address], [place], [place] [address]    Report Status PENDING  Incomplete  Culture, blood (routine x 2)     Status: None (Preliminary result)   Collection Time: 11/21/20  9:30 PM   Specimen: BLOOD  Result Value Ref Range Status   Specimen Description   Final    BLOOD LEFT ANTECUBITAL Performed at [facility] [address], [place], [place] [address]    Special Requests   Final    BOTTLES DRAWN AEROBIC AND ANAEROBIC Blood Culture adequate volume Performed at [facility] [address] [place], [place] [address]    Culture   Final    NO GROWTH 3 DAYS Performed at [facility], [place] [address] [place],  [address]  Report Status PENDING  Incomplete  Urine culture     Status: None   Collection Time: 11/22/20  7:40 AM   Specimen: Urine, Random  Result Value Ref Range Status   Specimen Description   Final    URINE, RANDOM Performed at [facility], [place] [address] [place], [place] [address]    Special Requests   Final    NONE Performed at [facility], [place] [address] [place], [place] [address]    Culture   Final    NO GROWTH Performed at [facility], [place] [address], [place], [place] [address]    Report Status 11/23/2020 FINAL  Final  C Difficile Quick Screen w PCR reflex     Status: None   Collection Time: 11/22/20 10:10 AM   Specimen: STOOL  Result Value Ref Range Status   C Diff antigen NEGATIVE NEGATIVE Final   C Diff toxin NEGATIVE NEGATIVE Final   C Diff interpretation No C. difficile detected.  Final    Comment: Performed at [facility], [place] [address], [place], [place] [address]     Labs:   CBC: Recent Labs  Lab 11/20/20 1123 11/21/20 1034 11/22/20 0514  WBC 9.3 8.0 7.7  NEUTROABS 7.0 6.1 5.6  HGB 14.9 15.7 13.3  HCT 43.5 48.4 40.7  MCV 92 95.3 95.3  PLT 168  151 376*   Basic Metabolic Panel: Recent Labs  Lab 11/20/20 1123 11/21/20 1034 11/22/20 0514  NA 135 134* 136  K 4.7 4.3 4.5  CL 98 100 103  CO2 18* 23 24  GLUCOSE 83 71 84  BUN 23 24* 19  CREATININE 1.26 1.24 1.06  CALCIUM 9.5 9.3 8.8*   Liver Function Tests: Recent Labs  Lab 11/20/20 1123 11/21/20 1034 11/22/20 0514  AST 52* 60* 55*  ALT 40 48* 40  ALKPHOS 133* 105 83  BILITOT 0.6 0.9 0.9  PROT 6.9 7.5 6.2*  ALBUMIN 4.3 4.2 3.4*   BNP (last 3 results) No results for input(s): BNP in the last 8760 hours. Cardiac Enzymes: Recent Labs  Lab 11/21/20 1034 11/22/20 0514  CKTOTAL 461* 546*   CBG: No results for input(s): GLUCAP in the last 168 hours. Hgb A1c No results for input(s): HGBA1C in the last 72 hours. Lipid Profile No results for input(s): CHOL, HDL, LDLCALC, TRIG, CHOLHDL, LDLDIRECT in the last 72 hours. Thyroid function studies No results for input(s): TSH, T4TOTAL, T3FREE, THYROIDAB in the last 72 hours.  Invalid input(s): FREET3 Anemia work up No results for input(s): VITAMINB12, FOLATE, FERRITIN, TIBC, IRON, RETICCTPCT in the last 72 hours. Urinalysis    Component Value Date/Time   COLORURINE YELLOW 11/22/2020 0740   APPEARANCEUR CLOUDY (A) 11/22/2020 0740   LABSPEC 1.024 11/22/2020 0740   PHURINE 5.0 11/22/2020 0740   GLUCOSEU 50 (A) 11/22/2020 0740   HGBUR MODERATE (A) 11/22/2020 0740   BILIRUBINUR NEGATIVE 11/22/2020 0740   KETONESUR 5 (A) 11/22/2020 0740   PROTEINUR 100 (A) 11/22/2020 0740   NITRITE NEGATIVE 11/22/2020 0740   LEUKOCYTESUR NEGATIVE 11/22/2020 0740         Time coordinating discharge: Over 45 minutes  SIGNED: Deatra James, MD, FACP, FHM. [facility],  Please use [email or web address] to Page If 7PM-7AM, please contact night-coverage [email or web address] 11/25/2020, 11:36 AM

## 2020-11-25 NOTE — Progress Notes (Signed)
Pt to be discharged to SNF today [facility] this afternoon. Report called to this facility Shella Spearing LPN accepting report for this facility. VSS and no acute changes noted with Pt's assessment at time of discharge

## 2020-11-26 ENCOUNTER — Non-Acute Institutional Stay (SKILLED_NURSING_FACILITY): Payer: Medicare Other | Admitting: Nurse Practitioner

## 2020-11-26 DIAGNOSIS — R627 Adult failure to thrive: Secondary | ICD-10-CM | POA: Diagnosis not present

## 2020-11-26 DIAGNOSIS — I350 Nonrheumatic aortic (valve) stenosis: Secondary | ICD-10-CM | POA: Diagnosis not present

## 2020-11-26 DIAGNOSIS — G301 Alzheimer's disease with late onset: Secondary | ICD-10-CM | POA: Diagnosis not present

## 2020-11-26 DIAGNOSIS — M48061 Spinal stenosis, lumbar region without neurogenic claudication: Secondary | ICD-10-CM | POA: Diagnosis not present

## 2020-11-26 DIAGNOSIS — M6281 Muscle weakness (generalized): Secondary | ICD-10-CM | POA: Diagnosis not present

## 2020-11-26 DIAGNOSIS — Z7401 Bed confinement status: Secondary | ICD-10-CM | POA: Diagnosis not present

## 2020-11-26 DIAGNOSIS — G250 Essential tremor: Secondary | ICD-10-CM

## 2020-11-26 DIAGNOSIS — Z950 Presence of cardiac pacemaker: Secondary | ICD-10-CM

## 2020-11-26 DIAGNOSIS — M159 Polyosteoarthritis, unspecified: Secondary | ICD-10-CM

## 2020-11-26 DIAGNOSIS — R1310 Dysphagia, unspecified: Secondary | ICD-10-CM | POA: Diagnosis not present

## 2020-11-26 DIAGNOSIS — N390 Urinary tract infection, site not specified: Secondary | ICD-10-CM | POA: Diagnosis not present

## 2020-11-26 DIAGNOSIS — F0281 Dementia in other diseases classified elsewhere with behavioral disturbance: Secondary | ICD-10-CM

## 2020-11-26 DIAGNOSIS — Z978 Presence of other specified devices: Secondary | ICD-10-CM

## 2020-11-26 DIAGNOSIS — E7800 Pure hypercholesterolemia, unspecified: Secondary | ICD-10-CM | POA: Diagnosis not present

## 2020-11-26 DIAGNOSIS — R319 Hematuria, unspecified: Secondary | ICD-10-CM | POA: Diagnosis not present

## 2020-11-26 DIAGNOSIS — R41841 Cognitive communication deficit: Secondary | ICD-10-CM | POA: Diagnosis not present

## 2020-11-26 DIAGNOSIS — R404 Transient alteration of awareness: Secondary | ICD-10-CM | POA: Diagnosis not present

## 2020-11-26 DIAGNOSIS — I4819 Other persistent atrial fibrillation: Secondary | ICD-10-CM

## 2020-11-26 DIAGNOSIS — M2550 Pain in unspecified joint: Secondary | ICD-10-CM | POA: Diagnosis not present

## 2020-11-26 DIAGNOSIS — F02818 Dementia in other diseases classified elsewhere, unspecified severity, with other behavioral disturbance: Secondary | ICD-10-CM

## 2020-11-26 DIAGNOSIS — I10 Essential (primary) hypertension: Secondary | ICD-10-CM | POA: Diagnosis not present

## 2020-11-26 DIAGNOSIS — R2681 Unsteadiness on feet: Secondary | ICD-10-CM | POA: Diagnosis not present

## 2020-11-26 DIAGNOSIS — R29898 Other symptoms and signs involving the musculoskeletal system: Secondary | ICD-10-CM | POA: Diagnosis not present

## 2020-11-26 DIAGNOSIS — R4182 Altered mental status, unspecified: Secondary | ICD-10-CM | POA: Diagnosis not present

## 2020-11-26 DIAGNOSIS — R278 Other lack of coordination: Secondary | ICD-10-CM | POA: Diagnosis not present

## 2020-11-26 DIAGNOSIS — R269 Unspecified abnormalities of gait and mobility: Secondary | ICD-10-CM

## 2020-11-26 LAB — CULTURE, BLOOD (ROUTINE X 2)
Culture: NO GROWTH
Special Requests: ADEQUATE

## 2020-11-26 NOTE — Assessment & Plan Note (Signed)
off Eliquis due to hematuria.  

## 2020-11-26 NOTE — Progress Notes (Signed)
Location:    [facility] Room Number: 32 Place of Service:  SNF (31) Provider: Marlana Latus NP  [name], MD  Patient Care Team: [name], MD as PCP - General (Internal Medicine) Monna Fam, MD as Consulting Physician (Ophthalmology)  Extended Emergency Contact Information Primary Emergency Contact: [name] Address: [address]          [place], [place] [address] Johnnette Litter of [place] Phone: [phone] Relation: Spouse Secondary Emergency Contact: [name] of [employer] Phone: [phone] Relation: Daughter Interpreter needed? No  Code Status: DNR Goals of care: Advanced Directive information Advanced Directives 11/22/2020  Does Patient Have a Medical Advance Directive? Yes  Type of Paramedic of [place];Living will  Does patient want to make changes to medical advance directive? No - Patient declined  Copy of [place] in Chart? No - copy requested     Chief Complaint  Patient presents with  . Medical Management of Chronic Issues    HPI:  Pt is a 87 y.o. male seen today for an acute visit for medication review  Hospitalized 11/19/20-11/25/20 for UTI, fully treated, had urology consultation. ED negative CTA PE or CT abd/pelvis, MRI stroke.   Dysphagia nectar thick per ST  Vascular dementia with behavioral disturbance, underwent neurology evaluation,  AMS has returned to his baseline dementia, CTH, MRI negative for stroke. Home use Alprazolam. Palliative care, may progress to Hospice. No heroic measures. Vit B12 2359 11/22/20. Takes Quetiapine 100mg  qd.   MRI L4-5 moderate spnal stenosis, no back pain noted while the patient in w/c.   Essential tremor, chronic.   S/p TAVR  Afib, off Eliquis due to hematuria.   Urinary retention,  chronic Foley Catheter, underwent urology evaluation  Complete heart block s/p pacer  Hyperlipidemia LDL  65  Gait abnormality, w/c for mobility  HTN, Amlodipine. Bun/creat 19/1.06 11/22/20  FTT advanced dementia, palliative care, takes Mirtazapine 7.5mg  qd.   OA, may consider Mobic prn, on prn Oxycodone, Tylenol    Past Medical History:  Diagnosis Date  . Arthritis    "right arm/shoulder" (02/10/2016)  . Complete heart block ([place]) 02/10/2016   a. s/p Emergency planning/management officer  . Goiter   . Hypercholesteremia   . Memory loss   . Nasal septal deviation   . Persistent atrial fibrillation ([place])   . Presence of permanent cardiac pacemaker   . 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 Severe aortic stenosis   . Tremor    Past Surgical History:  Procedure Laterality Date  . CATARACT EXTRACTION W/ INTRAOCULAR LENS  IMPLANT, BILATERAL Bilateral ~ 2000  . EP IMPLANTABLE DEVICE N/A 02/11/2016   Procedure: Pacemaker Implant;  Surgeon: Zielke Lance, MD;  Location: [facility];  Service: Cardiovascular;  Laterality: N/A;  . RIGHT/LEFT HEART CATH AND CORONARY ANGIOGRAPHY N/A 05/03/2018   Procedure: RIGHT/LEFT HEART CATH AND CORONARY ANGIOGRAPHY;  Surgeon: Burnell Blanks, MD;  Location: [facility];  Service: Cardiovascular;  Laterality: N/A;  . TEE WITHOUT CARDIOVERSION N/A 05/30/2018   Procedure: TRANSESOPHAGEAL ECHOCARDIOGRAM (TEE);  Surgeon: Burnell Blanks, MD;  Location: [facility];  Service: Open Heart Surgery;  Laterality: N/A;  . TONSILLECTOMY  1930s  . TRANSCATHETER AORTIC VALVE REPLACEMENT, TRANSFEMORAL N/A 05/30/2018   Procedure: TRANSCATHETER AORTIC VALVE REPLACEMENT, TRANSFEMORAL using a 64mm  Edwards Sapien 3 Aortic Valve;  Surgeon: Burnell Blanks, MD;  Location: [facility];  Service: Open Heart Surgery;  Laterality: N/A;  . WISDOM TOOTH EXTRACTION  1990s    No Known Allergies  Allergies as of 11/26/2020   No Known Allergies      Medication List    Notice   This visit is on the same day as an admission, and a visit start time could not be determined. If the visit took place after discharge, manually review the med list with the patient.     Review of Systems  Constitutional: Positive for appetite change and fatigue. Negative for chills, diaphoresis and fever.  HENT: Positive for hearing loss and trouble swallowing. Negative for congestion and voice change.   Respiratory: Negative for cough, shortness of breath and wheezing.   Cardiovascular: Negative for chest pain, palpitations and leg swelling.  Gastrointestinal: Negative for abdominal pain, constipation, nausea and vomiting.  Genitourinary: Positive for difficulty urinating.       Foley  Musculoskeletal: Positive for arthralgias, back pain and gait problem.  Skin: Negative for color change.  Neurological: Positive for tremors and speech difficulty. Negative for weakness, light-headedness and headaches.       Dementia, expressive aphasia.   Psychiatric/Behavioral: Positive for behavioral problems and confusion. Negative for sleep disturbance. The patient is not nervous/anxious.     Immunization History  Administered Date(s) Administered  . Influenza, High Dose Seasonal PF 08/01/2014, 06/16/2015, 06/17/2016  . Influenza, Quadrivalent, Recombinant, Inj, Pf 07/26/2017, 07/21/2018, 06/08/2019, 07/02/2020  . Influenza-Unspecified 07/20/2013  . Pneumococcal Conjugate-13 01/16/2013  . Pneumococcal Polysaccharide-23 01/07/1999  . Td 11/13/2018  . Tdap 01/11/2012  . Zoster 09/07/2006  . Zoster Recombinat (Shingrix) 02/27/2019, 02/27/2019   Pertinent  Health Maintenance Due  Topic Date Due  . INFLUENZA VACCINE  Completed  . PNA vac Low Risk Adult  Completed   Fall Risk  03/06/2019  Falls in the past year? 1  Number falls in past yr: 1  Injury with Fall? 0   Functional Status Survey:    Vitals:   12/01/20 1054  BP: 140/80  Pulse: 64  Resp: 15  Temp:  (!) 97.2 F (36.2 C)  SpO2: 94%  Weight: 157 lb 11.2 oz (71.5 kg)  Height: 6' (1.829 m)   Body mass index is 21.39 kg/m. Physical Exam Vitals and nursing note reviewed.  Constitutional:      General: He is not in acute distress.    Appearance: Normal appearance. He is not ill-appearing, toxic-appearing or diaphoretic.  HENT:     Head: Normocephalic and atraumatic.     Nose: Nose normal.     Mouth/Throat:     Mouth: Mucous membranes are moist.  Eyes:     Extraocular Movements: Extraocular movements intact.     Conjunctiva/sclera: Conjunctivae normal.     Pupils: Pupils are equal, round, and reactive to light.  Cardiovascular:     Rate and Rhythm: Normal rate. Rhythm irregular.     Heart sounds: No murmur heard.   Pulmonary:     Effort: Pulmonary effort is normal.     Breath sounds: No wheezing, rhonchi or rales.  Abdominal:     General: Bowel sounds are normal.     Palpations: Abdomen is soft.     Tenderness: There is no abdominal tenderness. There is no left CVA tenderness, guarding or rebound.  Musculoskeletal:     Cervical back: Normal range of motion and neck supple.     Right lower  leg: No edema.     Left lower leg: No edema.  Skin:    General: Skin is warm and dry.  Neurological:     General: No focal deficit present.     Mental Status: He is alert. Mental status is at baseline.     Cranial Nerves: No cranial nerve deficit.     Motor: No weakness.     Coordination: Coordination abnormal.     Gait: Gait abnormal.     Comments: Oriented to self. Followed simple directions during my examination.   Psychiatric:        Mood and Affect: Mood normal.        Behavior: Behavior normal.     Labs reviewed: Recent Labs    11/20/20 1123 11/21/20 1034 11/22/20 0514  NA 135 134* 136  K 4.7 4.3 4.5  CL 98 100 103  CO2 18* 23 24  GLUCOSE 83 71 84  BUN 23 24* 19  CREATININE 1.26 1.24 1.06  CALCIUM 9.5 9.3 8.8*   Recent Labs    11/20/20 1123 11/21/20 1034  11/22/20 0514  AST 52* 60* 55*  ALT 40 48* 40  ALKPHOS 133* 105 83  BILITOT 0.6 0.9 0.9  PROT 6.9 7.5 6.2*  ALBUMIN 4.3 4.2 3.4*   Recent Labs    11/20/20 1123 11/21/20 1034 11/22/20 0514  WBC 9.3 8.0 7.7  NEUTROABS 7.0 6.1 5.6  HGB 14.9 15.7 13.3  HCT 43.5 48.4 40.7  MCV 92 95.3 95.3  PLT 168 151 140*   Lab Results  Component Value Date   TSH 2.277 11/21/2020   Lab Results  Component Value Date   HGBA1C 5.8 (H) 11/22/2020   Lab Results  Component Value Date   CHOL 127 11/22/2020   HDL 52 11/22/2020   LDLCALC 65 11/22/2020   TRIG 50 11/22/2020   CHOLHDL 2.4 11/22/2020    Significant Diagnostic Results in last 30 days:  DG Chest 2 View  Result Date: 11/13/2020 CLINICAL DATA:  Right upper back pain. EXAM: CHEST - 2 VIEW COMPARISON:  Chest x-ray dated April 16, 2019. FINDINGS: Unchanged left chest wall pacemaker. Interval enlargement of the cardiopericardial silhouette with a globular appearance. Prior TAVR. Normal pulmonary vascularity. No focal consolidation, pleural effusion, or pneumothorax. No acute osseous abnormality. IMPRESSION: 1. Interval enlargement of the cardiopericardial silhouette with a globular appearance, raising concern for pericardial effusion. Electronically Signed   By: Titus Dubin M.D.   On: 11/13/2020 19:55   CT HEAD WO CONTRAST  Result Date: 11/21/2020 CLINICAL DATA:  87 year old male with progressive altered mental status, generalized weakness. This exam originally designated as [facility] to report, however, the [facility] Emergency Department contacted me by phone requesting urgent interpretation. EXAM: CT HEAD WITHOUT CONTRAST TECHNIQUE: Contiguous axial images were obtained from the base of the skull through the vertex without intravenous contrast. COMPARISON:  Head CT 01/14/2017. FINDINGS: Brain: Encephalomalacia at the left occipital pole is new since 2018 and associated with mild interval enlargement of the left occipital  horn. Patchy bilateral cerebral white matter hypodensity has progressed since 2018, most notably the posterior limb right internal capsule on series 2, image 19. No midline shift, ventriculomegaly, mass effect, evidence of mass lesion, intracranial hemorrhage or evidence of cortically based acute infarction. Vascular: Calcified atherosclerosis at the skull base. Evidence of a small unruptured 6-7 mm basilar tip aneurysm or focal vessel ectasia (coronal image 38), probably not significantly changed since 2018. No suspicious intracranial vascular hyperdensity. Skull: No  acute osseous abnormality identified. Sinuses/Orbits: Chronic right maxillary sinusitis with mucoperiosteal thickening and inspissated material. Other Visualized paranasal sinuses and mastoids are clear. Other: No acute orbit or scalp soft tissue finding identified. IMPRESSION: 1. No acute intracranial abnormality by CT. - small Left PCA infarct at the left occipital pole is new since 2018 but appears chronic. - Progressed cerebral white matter disease since 2018, most likely due to small vessel ischemia. 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 2. Chronic right maxillary sinusitis. The above was discussed by telephone on 11/21/2020 at 1019 hours with ED provider Georga Kaufmann. Electronically Signed   By: Genevie Ann M.D.   On: 11/21/2020 10:32   CT Angio Chest PE W and/or Wo Contrast  Result Date: 11/21/2020 CLINICAL DATA:  Chest and left flank pain. Hypoxia and altered mental status. EXAM: CT ANGIOGRAPHY CHEST CT ABDOMEN AND PELVIS WITH CONTRAST TECHNIQUE: Multidetector CT imaging of the chest was performed using the standard protocol during bolus administration of intravenous contrast. Multiplanar CT image reconstructions and MIPs were obtained to evaluate the vascular anatomy. Multidetector CT imaging of the abdomen and pelvis was performed using the standard protocol during bolus administration of  intravenous contrast. CONTRAST:  146mL OMNIPAQUE IOHEXOL 350 MG/ML SOLN COMPARISON:  CT a chest, abdomen, and pelvis dated November 13, 2020. FINDINGS: CTA CHEST FINDINGS Cardiovascular: Satisfactory opacification of the pulmonary arteries to the segmental level. No evidence of pulmonary embolism. Stable cardiomegaly status post TAVR. No pericardial effusion. Unchanged mild aneurysmal dilatation of the mid ascending thoracic aorta measuring up to 4.0 cm. Coronary, aortic arch, and branch vessel atherosclerotic vascular disease. Unchanged left chest wall pacemaker. Mediastinum/Nodes: No enlarged mediastinal, hilar, or axillary lymph nodes. Unchanged 2.0 cm partially calcified hypodense nodule in the right thyroid lobe This has been evaluated on previous imaging. The trachea and esophagus demonstrate no significant findings. Lungs/Pleura: Minimal dependent subsegmental atelectasis in both lower lobes. No focal consolidation, pleural effusion, or pneumothorax. No suspicious pulmonary nodule. Biapical pleuroparenchymal scarring, greater on the right. Musculoskeletal: No chest wall abnormality. No acute or significant osseous findings. Review of the MIP images confirms the above findings. CT ABDOMEN AND PELVIS FINDINGS Hepatobiliary: No focal liver abnormality. Unchanged gallstone. No gallbladder wall thickening or biliary dilatation. Probable adenomyomatosis at the gallbladder fundus. Pancreas: Unremarkable. No pancreatic ductal dilatation or surrounding inflammatory changes. Spleen: Normal in size without focal abnormality. Adrenals/Urinary Tract: Adrenal glands are unremarkable. Unchanged bilateral renal cysts. No renal calculi or hydronephrosis. The bladder is decompressed by Foley catheter. Stomach/Bowel: Stomach is within normal limits. Appendix appears normal. No evidence of bowel wall thickening, distention, or inflammatory changes. Redundant sigmoid colon. Vascular/Lymphatic: Aortic atherosclerosis. No enlarged  abdominal or pelvic lymph nodes. Reproductive: Unchanged mild prostatomegaly. Other: No abdominal wall hernia or abnormality. No abdominopelvic ascites. No pneumoperitoneum. Musculoskeletal: No acute or significant osseous findings. Review of the MIP images confirms the above findings. IMPRESSION: Chest: 1. No evidence of pulmonary embolism. No acute intrathoracic process. 2. Unchanged mild aneurysmal dilatation of the mid ascending thoracic aorta measuring up to 4.0 cm. Recommend annual imaging followup by CTA or MRA. This recommendation follows 2010 ACCF/AHA/AATS/ACR/ASA/SCA/SCAI/SIR/STS/SVM Guidelines for the Diagnosis and Management of Patients with Thoracic Aortic Disease. Circulation. 2010; 121: H852-D782. Aortic aneurysm NOS (ICD10-I71.9) 3.  Aortic atherosclerosis (ICD10-I70.0). Abdomen and pelvis: 1. No acute intra-abdominal process. 2. Unchanged cholelithiasis. Electronically Signed   By: Titus Dubin M.D.   On: 11/21/2020 13:07   MR BRAIN WO CONTRAST  Result Date: 11/21/2020 CLINICAL DATA:  Dementia.  Recent worsening.  Weakness. EXAM: MRI HEAD WITHOUT CONTRAST TECHNIQUE: Multiplanar, multiecho pulse sequences of the brain and surrounding structures were obtained without intravenous contrast. COMPARISON:  Head CT yesterday. FINDINGS: Brain: Diffusion imaging does not show any acute or subacute infarction. Chronic small-vessel ischemic changes affect the pons. Few old small vessel cerebellar infarctions. Cerebral hemispheres show generalized atrophy, possibly with temporal lobe predominance. There is extensive chronic small-vessel ischemic change of the hemispheric white matter. 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 No evidence of mass lesion, hemorrhage, hydrocephalus or extra-axial collection. Vascular: Major vessels at the base of the brain show flow. Ectasia of the basilar tip as seen previously with maximal diameter 6.4 mm. Skull and upper cervical spine: Negative Sinuses/Orbits:  Rhinosinusitis of the right maxillary sinus. Orbits negative. Other: None IMPRESSION: 1. No acute or reversible finding. Generalized atrophy, possibly with some temporal lobe predominance. Extensive chronic small-vessel ischemic changes throughout the brain. 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 2. Ectasia of the basilar tip as seen previously with maximal diameter 6.4 mm. Electronically Signed   By: Nelson Chimes M.D.   On: 11/21/2020 19:15   MR LUMBAR SPINE WO CONTRAST  Result Date: 11/21/2020 CLINICAL DATA:  Increasing leg weakness developing over the past 3-4 weeks. EXAM: MRI LUMBAR SPINE WITHOUT CONTRAST TECHNIQUE: Multiplanar, multisequence MR imaging of the lumbar spine was performed. No intravenous contrast was administered. COMPARISON:  MRI lumbar spine 06/17/2012. FINDINGS: Segmentation:  Standard. Alignment:  No listhesis. Vertebrae: No fracture, evidence of discitis, or bone lesion. Degenerative endplate signal change Z6-1 and L5-S1 has progressed since the prior exam. Conus medullaris and cauda equina: Conus extends to the L1 level. Conus and cauda equina appear normal. Paraspinal and other soft tissues: T2 hyperintense lesion in the right kidney is most consistent with a cyst. Disc levels: T12-L1: Negative. L1-2: Negative. L2-3: Negative. L3-4: Shallow disc bulge and mild facet degenerative change. No stenosis. L4-5: There has been progression of spondylosis at this level. Loss of disc space height with a disc bulge, ligamentum flavum thickening and moderate facet arthropathy are seen. There is moderate central canal stenosis and mild narrowing in the right subarticular recess. Mild foraminal narrowing is more notable on the right. L5-S1: There has been some progression of disease at this level. Loss of disc space height with a bulge eccentric to the left. The central canal is open. Mild to moderate bilateral foraminal narrowing. IMPRESSION: Some progression of spondylosis at L4-5 where there is  moderate central canal stenosis and mild narrowing in the right subarticular recess. Mild bilateral foraminal narrowing is more notable on the right at this level. Some progression of spondylosis at L5-S1 where there is mild to moderate bilateral foraminal narrowing. The central canal is patent. Electronically Signed   By: Inge Rise M.D.   On: 11/21/2020 19:20   CT ABDOMEN PELVIS W CONTRAST  Result Date: 11/21/2020 CLINICAL DATA:  Chest and left flank pain. Hypoxia and altered mental status. EXAM: CT ANGIOGRAPHY CHEST CT ABDOMEN AND PELVIS WITH CONTRAST TECHNIQUE: Multidetector CT imaging of the chest was performed using the standard protocol during bolus administration of intravenous contrast. Multiplanar CT image reconstructions and MIPs were obtained to evaluate the vascular anatomy. Multidetector CT imaging of the abdomen and pelvis was performed using the standard protocol during bolus administration of intravenous contrast. CONTRAST:  186mL OMNIPAQUE IOHEXOL 350 MG/ML SOLN COMPARISON:  CT a chest, abdomen, and pelvis dated November 13, 2020. FINDINGS: CTA CHEST FINDINGS Cardiovascular: Satisfactory  opacification of the pulmonary arteries to the segmental level. No evidence of pulmonary embolism. Stable cardiomegaly status post TAVR. No pericardial effusion. Unchanged mild aneurysmal dilatation of the mid ascending thoracic aorta measuring up to 4.0 cm. Coronary, aortic arch, and branch vessel atherosclerotic vascular disease. Unchanged left chest wall pacemaker. Mediastinum/Nodes: No enlarged mediastinal, hilar, or axillary lymph nodes. Unchanged 2.0 cm partially calcified hypodense nodule in the right thyroid lobe This has been evaluated on previous imaging. The trachea and esophagus demonstrate no significant findings. Lungs/Pleura: Minimal dependent subsegmental atelectasis in both lower lobes. No focal consolidation, pleural effusion, or pneumothorax. No suspicious pulmonary nodule. Biapical  pleuroparenchymal scarring, greater on the right. Musculoskeletal: No chest wall abnormality. No acute or significant osseous findings. Review of the MIP images confirms the above findings. CT ABDOMEN AND PELVIS FINDINGS Hepatobiliary: No focal liver abnormality. Unchanged gallstone. No gallbladder wall thickening or biliary dilatation. Probable adenomyomatosis at the gallbladder fundus. Pancreas: Unremarkable. No pancreatic ductal dilatation or surrounding inflammatory changes. Spleen: Normal in size without focal abnormality. Adrenals/Urinary Tract: Adrenal glands are unremarkable. Unchanged bilateral renal cysts. No renal calculi or hydronephrosis. The bladder is decompressed by Foley catheter. Stomach/Bowel: Stomach is within normal limits. Appendix appears normal. No evidence of bowel wall thickening, distention, or inflammatory changes. Redundant sigmoid colon. Vascular/Lymphatic: Aortic atherosclerosis. No enlarged abdominal or pelvic lymph nodes. Reproductive: Unchanged mild prostatomegaly. Other: No abdominal wall hernia or abnormality. No abdominopelvic ascites. No pneumoperitoneum. Musculoskeletal: No acute or significant osseous findings. Review of the MIP images confirms the above findings. IMPRESSION: Chest: 1. No evidence of pulmonary embolism. No acute intrathoracic process. 2. Unchanged mild aneurysmal dilatation of the mid ascending thoracic aorta measuring up to 4.0 cm. Recommend annual imaging followup by CTA or MRA. This recommendation follows 2010 ACCF/AHA/AATS/ACR/ASA/SCA/SCAI/SIR/STS/SVM Guidelines for the Diagnosis and Management of Patients with Thoracic Aortic Disease. Circulation. 2010; 121: V253-G644. Aortic aneurysm NOS (ICD10-I71.9) 3.  Aortic atherosclerosis (ICD10-I70.0). Abdomen and pelvis: 1. No acute intra-abdominal process. 2. Unchanged cholelithiasis. Electronically Signed   By: Titus Dubin M.D.   On: 11/21/2020 13:07   DG Chest Portable 1 View  Result Date:  11/21/2020 CLINICAL DATA:  87 year old male with history of altered mental status. Weakness. EXAM: PORTABLE CHEST 1 VIEW COMPARISON:  Chest x-ray 11/13/2020. FINDINGS: Lung volumes are normal. No consolidative airspace disease. No pleural effusions. No suspicious appearing pulmonary nodules or masses are noted. No pneumothorax. No evidence of pulmonary edema. Mild cardiomegaly. Upper mediastinal contours are within normal limits. Atherosclerotic calcifications in the thoracic aorta. Status post TAVR. Left-sided pacemaker device in place with lead tips projecting over the expected location of the right atrium and right ventricle. IMPRESSION: 1. No radiographic evidence of acute cardiopulmonary disease. 2. Mild cardiomegaly. 3. Aortic atherosclerosis. Electronically Signed   By: Vinnie Langton M.D.   On: 11/21/2020 10:54   ECHOCARDIOGRAM COMPLETE  Result Date: 11/07/2020    ECHOCARDIOGRAM REPORT   Patient Name:   SHAFIN POLLIO Date of Exam: 11/07/2020 Medical Rec #:  [id number]       Height:       70.0 in Accession #:    [id number]      Weight:       169.0 lb Date of Birth:  [birth year]        BSA:          1.943 m Patient Age:    87 years        BP:           134/76 mmHg  Patient Gender: M               HR:           60 bpm. Exam Location:  [address] Procedure: 2D Echo, Cardiac Doppler and Color Doppler Indications:    Z95.2 TAVR  History:        Patient has prior history of Echocardiogram examinations, most                 recent 11/20/2019. Pacemaker, S/p TAVR (66mm Berniece Pap),                 Arrythmias:Atrial Fibrillation, Signs/Symptoms:Dilated aortic                 root; Risk Factors:Former Smoker and Dyslipidemia.  Sonographer:    Coralyn Helling RDCS Referring Phys: [id number] [place]  Sonographer Comments: Technically difficult study due to poor echo windows. Image acquisition challenging due to patient body habitus. IMPRESSIONS  1. Left ventricular ejection fraction, by estimation, is 55 to  60%. The left ventricle has normal function. The left ventricle has no regional wall motion abnormalities. There is mild concentric left ventricular hypertrophy. Left ventricular diastolic parameters are indeterminate.  2. Right ventricular systolic function is mildly reduced. The right ventricular size is severely enlarged.  3. Left atrial size was severely dilated.  4. Right atrial size was severely dilated.  5. The mitral valve is degenerative. Mild to moderate mitral valve regurgitation. Severe mitral annular calcification.  6. Tricuspid valve regurgitation is moderate, eccentric and appears to have two jets. Eccentricity may underestimate severity.  7. The aortic valve has been repaired/replaced. Aortic valve regurgitation is mild to moderate. Procedure Date: 05/30/2018. Aortic valve mean gradient measures 16.8 mmHg. Aortic valve Vmax measures 2.88 m/s. Mild to moderate paralvalular leak seen in the  PSAX at the 2-3 O'clock position with small additional jet at the 9 o'clock position. DVI 0.43, with prosthetic aortic valve acceleration time < 100 ms.  8. Aortic dilatation noted. There is mild dilatation of the ascending aorta, measuring 40 mm. Comparison(s): A prior study was performed on 11/20/2019. Prior images reviewed side by side. Compared to prior similar paravalvular leak, prosthetic valve measures, and ascending aorta dilation. Right ventricle has dilated further from last study. FINDINGS  Left Ventricle: Left ventricular ejection fraction, by estimation, is 55 to 60%. The left ventricle has normal function. The left ventricle has no regional wall motion abnormalities. The left ventricular internal cavity size was normal in size. There is  mild concentric left ventricular hypertrophy. Left ventricular diastolic parameters are indeterminate. Right Ventricle: The right ventricular size is severely enlarged. No increase in right ventricular wall thickness. Right ventricular systolic function is mildly  reduced. Left Atrium: Left atrial size was severely dilated. Right Atrium: Right atrial size was severely dilated. Pericardium: There is no evidence of pericardial effusion. Mitral Valve: The mitral valve is degenerative in appearance. Severe mitral annular calcification. Mild to moderate mitral valve regurgitation. Tricuspid Valve: The tricuspid valve is normal in structure. Tricuspid valve regurgitation is moderate. Aortic Valve: Mild to moderate paralvalular leak seen in the PSAX at the 2-3 O'clock position with small additional jet at the 9 o'clock position. DVI 0.43, with prosthetic aortic valve acceleration time < 100 ms. The aortic valve has been repaired/replaced. Aortic valve regurgitation is mild to moderate. Aortic regurgitation PHT measures 480 msec. Aortic valve mean gradient measures 16.8 mmHg. Aortic valve peak gradient measures 33.1 mmHg. Aortic valve area, by VTI measures  1.34 cm. There is a 23 mm Sapien prosthetic, stented (TAVR) valve present in the aortic position. Pulmonic Valve: The pulmonic valve was grossly normal. Pulmonic valve regurgitation is mild. No evidence of pulmonic stenosis. Aorta: Aortic dilatation noted and the aortic root is normal in size and structure. There is mild dilatation of the ascending aorta, measuring 40 mm. IAS/Shunts: The atrial septum is grossly normal. Additional Comments: A pacer wire is visualized.  LEFT VENTRICLE PLAX 2D LVIDd:         4.30 cm  Diastology LVIDs:         2.60 cm  LV e' medial:    5.44 cm/s LV PW:         1.20 cm  LV E/e' medial:  33.5 LV IVS:        1.10 cm  LV e' lateral:   6.96 cm/s LVOT diam:     2.00 cm  LV E/e' lateral: 26.1 LV SV:         76 LV SV Index:   39 LVOT Area:     3.14 cm  RIGHT VENTRICLE            IVC RV S prime:     9.36 cm/s  IVC diam: 1.30 cm TAPSE (M-mode): 1.6 cm RVSP:           32.2 mmHg LEFT ATRIUM              Index       RIGHT ATRIUM           Index LA diam:        4.80 cm  2.47 cm/m  RA Pressure: 3.00 mmHg LA Vol  (A2C):   106.0 ml 54.55 ml/m RA Area:     37.80 cm LA Vol (A4C):   122.0 ml 62.79 ml/m RA Volume:   167.00 ml 85.94 ml/m LA Biplane Vol: 115.0 ml 59.18 ml/m  AORTIC VALVE AV Area (Vmax):    1.27 cm AV Area (Vmean):   1.35 cm AV Area (VTI):     1.34 cm AV Vmax:           287.75 cm/s AV Vmean:          187.500 cm/s AV VTI:            0.572 m AV Peak Grad:      33.1 mmHg AV Mean Grad:      16.8 mmHg LVOT Vmax:         116.00 cm/s LVOT Vmean:        80.700 cm/s LVOT VTI:          0.243 m LVOT/AV VTI ratio: 0.43 AI PHT:            480 msec  AORTA Ao Root diam: 3.20 cm Ao Asc diam:  4.00 cm MV E velocity: 182.00 cm/s  TRICUSPID VALVE MV A velocity: 84.80 cm/s   TR Peak grad:   29.2 mmHg MV E/A ratio:  2.15         TR Vmax:        270.00 cm/s                             Estimated RAP:  3.00 mmHg                             RVSP:  32.2 mmHg                              SHUNTS                             Systemic VTI:  0.24 m                             Systemic Diam: 2.00 cm Rudean Haskell MD Electronically signed by Rudean Haskell MD Signature Date/Time: 11/07/2020/4:04:10 PM    Final    CT Angio Chest/Abd/Pel for Dissection W and/or W/WO  Result Date: 11/13/2020 CLINICAL DATA:  Aortic dissection suspected, right flank pain for 4 days, recent UTI, history of dementia EXAM: CT ANGIOGRAPHY CHEST, ABDOMEN AND PELVIS TECHNIQUE: Non-contrast CT of the chest was initially obtained. Multidetector CT imaging through the chest, abdomen and pelvis was performed using the standard protocol during bolus administration of intravenous contrast. Multiplanar reconstructed images and MIPs were obtained and reviewed to evaluate the vascular anatomy. CONTRAST:  151mL OMNIPAQUE IOHEXOL 350 MG/ML SOLN COMPARISON:  Radiograph 11/13/2020, CT 05/16/2018 thyroid ultrasound 12/22/2012 FINDINGS: CTA CHEST FINDINGS Cardiovascular: Noncontrast CT images of the chest were obtained initially. Images depict a normal caliber  thoracic aorta mild dilatation of the ascending thoracic aorta to 4 cm, not significantly changed from comparison imaging in 2019. Atherosclerotic plaque within the normal caliber aorta. No hyperdense mural thickening or plaque displacement to suggest intramural hematoma. Postcontrast administration there is satisfactory opacification of the arterial vasculature. No acute luminal abnormality of the imaged aorta. No periaortic stranding or hemorrhage. Normal 3 vessel branching of the aortic arch. Proximal great vessels mildly calcified and tortuous but otherwise unremarkable. No significant flow-limiting stenosis or occlusion is seen. Central pulmonary arteries are borderline enlarged. No large central, lobar or proximal segmental filling defects are seen within limitations of this non tailored examination of the pulmonary arteries. Redemonstration of cardiomegaly with predominantly right atrioventricular enlargement. A left chest wall battery pack is in position with pacer leads terminating at the right atrium and cardiac apex. There is dense calcification of mitral annulus. Prior transcatheter aortic valve replacement is noted, in expected positioning. Extensive coronary artery calcifications are seen. No pericardial effusion. Slight reflux of contrast into the hepatic veins and IVC. No major venous abnormalities within the limitations of this arterial phase exam. Mediastinum/Nodes: No mediastinal fluid or gas. Stable appearance of a 2 cm hypoattenuating the partially calcified right posterior thyroid nodule. Thoracic inlet is otherwise unremarkable. No acute abnormality of the trachea or esophagus. No worrisome mediastinal, hilar or axillary adenopathy. Lungs/Pleura: Chronic region of of focal subpleural scarring and architectural distortion in the right upper lobe. More mild biapical pleuroparenchymal scarring is present as well. Some dependent atelectasis is seen posteriorly. Stable calcified perifissural nodule  measuring 2 mm in the right middle lobe. No known new or concerning pulmonary nodules or masses are seen. Musculoskeletal: The osseous structures appear diffusely demineralized which may limit detection of small or nondisplaced fractures. No acute osseous abnormality or suspicious osseous lesion. Levocurvature of the lower thoracic spine, apex T8-9 with exaggerated thoracic kyphosis is similar to comparison exam. Additional degenerative changes in the shoulders and sternoclavicular joints. No worrisome chest wall masses or lesions. Left chest wall battery pack, as above. Mild bilateral gynecomastia. Review of the MIP images confirms the above findings. CTA ABDOMEN AND PELVIS  FINDINGS VASCULAR Aorta: Calcified and noncalcified atheromatous plaque throughout the abdominal aorta. No flow-limiting stenosis or occlusion is seen. No acute luminal abnormality. No periaortic stranding or hemorrhage. Celiac: Widely patent ostium, normal opacification. Minimal plaque in the proximal branches, most pronounced in the splenic artery, without significant flow-limiting stenosis or occlusion. No aneurysm or ectasia. No acute luminal abnormality. SMA: Mild ostial plaque narrowing. Otherwise patent without evidence of aneurysm, dissection, vasculitis or other significant stenosis. Renals: Single renal arteries bilaterally. Mild bilateral ostial plaque narrowing, left slightly greater than right. Vessels are otherwise normally opacified without acute luminal abnormality, features of vasculitis or fibromuscular dysplasia. IMA: Mild-to-moderate ostial plaque narrowing. Otherwise normally opacified. No evidence of aneurysm, dissection or vasculitis. Inflow: Calcification of the inflow vasculature predominantly within the common and internal iliac arteries. Mild ectasia of the internal iliac just proximal to the bifurcation of the anterior and posterior divisions. No other acute vascular abnormality is seen in the inflow vessels. No acute  luminal abnormality. No high-grade stenosis or occlusions. Limited visualization of the proximal outflow with calcified plaque in the bilateral common and superficial femoral arteries. Widely patent origins of the bilateral profundus femora S arteries. Veins: Reflux of contrast into the hepatic veins and IVC. No other major venous abnormality within limitations of this arterial phase examination. Review of the MIP images confirms the above findings. NON-VASCULAR Hepatobiliary: No worrisome focal liver lesions. Smooth liver surface contour. Normal hepatic attenuation. Gallbladder contains a dependently layering calcified 11 mm gallstone. There is some additional hyperdense thickening towards the gallbladder fundus which could reflect inflammatory wall thickening versus adenomyomatosis. No other pericholecystic fluid or inflammation is seen. No biliary ductal dilatation or visible intraductal gallstones. Small air-filled duodenal diverticula near the ampulla. Pancreas: No pancreatic ductal dilatation or surrounding inflammatory changes. Spleen: Mild heterogeneity of the splenic enhancement is a typical finding in the arterial phase of imaging. Normal in size. No concerning splenic lesions. Adrenals/Urinary Tract: Kidneys enhance symmetrically and uniformly. Fluid attenuation cyst is seen in the interpolar left kidney measuring up to 3.2 cm and lower pole right kidney measuring up to 4.1 cm. No concerning internal complexity. No other concerning renal mass. No visible urolithiasis or hydronephrosis. Bladder decompressed by inflated Foley catheter. Mild bladder wall thickening perivesicular hazy stranding is nonspecific with few punctate foci of gas likely related to instrumentation. Stomach/Bowel: Distal esophagus, stomach and duodenal sweep are unremarkable. No small bowel wall thickening or dilatation. No evidence of obstruction. Normal air-filled appendix in the right lower quadrant. No colonic dilatation or wall  thickening. Lymphatic: No suspicious or enlarged lymph nodes in the included lymphatic chains. Reproductive: Mild prostatomegaly. Seminal vesicles are unremarkable. Other: No abdominopelvic free fluid or free gas. No bowel containing hernias. Mild body wall edema. Some focal skin thickening noted adjacent the greater trochanters, correlate with visual inspection. Musculoskeletal: The osseous structures appear diffusely demineralized which may limit detection of small or nondisplaced fractures. No acute osseous abnormality or suspicious osseous lesion. Multilevel degenerative changes are present in the imaged portions of the spine. Features most pronounced L4, L5 to some mild retrolisthesis L5 on S1 and resulting moderate bilateral foraminal narrowing. Additional degenerative changes in the hips and pelvis. Review of the MIP images confirms the above findings. IMPRESSION: Vascular 1. No evidence of acute aortic syndrome. 2. Stable dilatation of the ascending thoracic aorta to 4 cm. Recommend annual imaging followup by CTA or MRA. This recommendation follows 2010 ACCF/AHA/AATS/ACR/ASA/SCA/SCAI/SIR/STS/SVM Guidelines for the Diagnosis and Management of Patients with Thoracic Aortic Disease. Circulation.  2010; 121: W413-K440. Aortic aneurysm NOS (ICD10-I71.9) 3. No evidence of pulmonary embolism. 4. Cardiomegaly with right atrioventricular enlargement. Reflux of contrast into the hepatic veins and IVC, suggestive of right heart dysfunction. Central pulmonary artery enlargement may reflect further elevation of the right sided pressures or chronic pulmonary artery hypertension. 5.  Aortic Atherosclerosis (ICD10-I70.0). 6. Mild to moderate ostial narrowing of the inferior mesenteric artery. Mild ostial narrowing of the bilateral renal arteries and SMA. 7. Mild ectasia of the left internal iliac, unchanged from prior Nonvascular 1. Foley catheter in place. Mild bladder wall thickening with perivesicular hazy stranding,  correlate with urinalysis to exclude cystitis. No obstructive urolithiasis or hydronephrosis is seen. 2. Cholelithiasis with mild gallbladder wall thickening versus adenomyomatosis at the gallbladder fundus. Correlate with right upper quadrant symptoms and consider further evaluation with right upper quadrant ultrasound. 3. Stable appearance of a 2 cm hypoattenuating the partially calcified right posterior thyroid nodule. This has been evaluated on previous imaging. (ref: J Am Coll Radiol. 2015 Feb;12(2): 143-50). 4. Stable regions of subpleural scarring and architectural distortion in the right upper lobe. Electronically Signed   By: Lovena Le M.D.   On: 11/13/2020 22:49   US Abdomen Limited RUQ (LIVER/GB)  Result Date: 11/14/2020 CLINICAL DATA:  Pain EXAM: ULTRASOUND ABDOMEN LIMITED RIGHT UPPER QUADRANT COMPARISON:  11/13/2020 FINDINGS: Gallbladder: The sonographic Percell Miller sign is negative. There are gallstones without significant gallbladder wall thickening or pericholecystic free fluid. Common bile duct: Diameter: 5 mm Liver: No focal lesion identified. Within normal limits in parenchymal echogenicity. Portal vein is patent on color Doppler imaging with normal direction of blood flow towards the liver. Other: None. IMPRESSION: There is cholelithiasis without secondary signs of acute cholecystitis. Electronically Signed   By: Constance Holster M.D.   On: 11/14/2020 00:08    Assessment/Plan: Dysphagia nectar thick per ST   Late onset Alzheimer's disease with behavioral disturbance [facility]) underwent neurology evaluation,  AMS has returned to his baseline dementia, CTH, MRI negative for stroke. Home use Alprazolam. Palliative care, may progress to Hospice. No heroic measures. Vit B12 2359 11/22/20. Takes Quetiapine 100mg  qd.    Lumbar spinal stenosis MRI L4-5 moderate spnal stenosis, no back pain noted while the patient in w/c. Change Mobic to prn.   Essential tremor Essential tremor, chronic.     Severe aortic stenosis S/p TAVR  Persistent atrial fibrillation (HCC) off Eliquis due to hematuria.   Chronic indwelling Foley catheter  chronic Foley Catheter, underwent urology evaluation   Presence of permanent cardiac pacemaker Complete heart block s/p pacer   Hypercholesteremia LDL 65  Gait abnormality W/c for mobility.   HTN (hypertension) Blood pressure is controlled, continue Amlodipine. Bun/creat 19/1.06 11/22/20   Adult failure to thrive FTT advanced dementia, palliative care, takes Mirtazapine 7.5mg  qd.    Osteoarthritis, generalized may consider Mobic prn, on prn Oxycodone, Tylenol       Family/ staff Communication: plan of care reviewed with the patient and charge nurse.   Labs/tests ordered: none  Time spend 35 minutes.

## 2020-11-26 NOTE — Assessment & Plan Note (Signed)
MRI L4-5 moderate spnal stenosis, no back pain noted while the patient in w/c. Change Mobic to prn.

## 2020-11-26 NOTE — Progress Notes (Signed)
[facility] arrived to transfer pt to [facility]. PT in no acute distress at this time.

## 2020-11-26 NOTE — Assessment & Plan Note (Signed)
LDL65 °

## 2020-11-26 NOTE — Assessment & Plan Note (Signed)
may consider Mobic prn, on prn Oxycodone, Tylenol

## 2020-11-26 NOTE — Assessment & Plan Note (Signed)
S/p TAVR 

## 2020-11-26 NOTE — Assessment & Plan Note (Signed)
Essential tremor, chronic.

## 2020-11-26 NOTE — Assessment & Plan Note (Signed)
Blood pressure is controlled, continue Amlodipine. Bun/creat 19/1.06 11/22/20

## 2020-11-26 NOTE — Assessment & Plan Note (Signed)
Complete heart block s/p pacer  

## 2020-11-26 NOTE — Assessment & Plan Note (Signed)
FTT advanced dementia, palliative care, takes Mirtazapine 7.5mg  qd.

## 2020-11-26 NOTE — Assessment & Plan Note (Signed)
underwent neurology evaluation,  AMS has returned to his baseline dementia, CTH, MRI negative for stroke. Home use Alprazolam. Palliative care, may progress to Hospice. No heroic measures. Vit B12 2359 11/22/20. Takes Quetiapine 100mg  qd.

## 2020-11-26 NOTE — Assessment & Plan Note (Signed)
nectar thick per ST  

## 2020-11-26 NOTE — Assessment & Plan Note (Signed)
W/c for mobility °

## 2020-11-26 NOTE — Assessment & Plan Note (Signed)
chronic Foley Catheter, underwent urology evaluation

## 2020-11-27 LAB — CULTURE, BLOOD (ROUTINE X 2)
Culture: NO GROWTH
Special Requests: ADEQUATE

## 2020-11-28 ENCOUNTER — Encounter: Payer: Self-pay | Admitting: Internal Medicine

## 2020-11-28 ENCOUNTER — Non-Acute Institutional Stay (SKILLED_NURSING_FACILITY): Payer: Medicare Other | Admitting: Internal Medicine

## 2020-11-28 ENCOUNTER — Other Ambulatory Visit: Payer: Self-pay | Admitting: Nurse Practitioner

## 2020-11-28 DIAGNOSIS — E7800 Pure hypercholesterolemia, unspecified: Secondary | ICD-10-CM | POA: Diagnosis not present

## 2020-11-28 DIAGNOSIS — Z978 Presence of other specified devices: Secondary | ICD-10-CM | POA: Diagnosis not present

## 2020-11-28 DIAGNOSIS — F02818 Dementia in other diseases classified elsewhere, unspecified severity, with other behavioral disturbance: Secondary | ICD-10-CM

## 2020-11-28 DIAGNOSIS — R627 Adult failure to thrive: Secondary | ICD-10-CM | POA: Diagnosis not present

## 2020-11-28 DIAGNOSIS — G301 Alzheimer's disease with late onset: Secondary | ICD-10-CM

## 2020-11-28 DIAGNOSIS — R1310 Dysphagia, unspecified: Secondary | ICD-10-CM | POA: Diagnosis not present

## 2020-11-28 DIAGNOSIS — I10 Essential (primary) hypertension: Secondary | ICD-10-CM | POA: Diagnosis not present

## 2020-11-28 DIAGNOSIS — F0281 Dementia in other diseases classified elsewhere with behavioral disturbance: Secondary | ICD-10-CM | POA: Diagnosis not present

## 2020-11-28 DIAGNOSIS — I4819 Other persistent atrial fibrillation: Secondary | ICD-10-CM

## 2020-11-28 NOTE — Progress Notes (Signed)
Provider:  Veleta Miners MD Location:   [facility] Room Number: 32 Place of Service:  SNF (31)  PCP: [name], MD Patient Care Team: [name], MD as PCP - General (Internal Medicine) Monna Fam, MD as Consulting Physician (Ophthalmology)  Extended Emergency Contact Information Primary Emergency Contact: [name] Address: [address]          [place], [place] [address] Johnnette Litter of [place] Phone: [phone] Relation: Spouse Secondary Emergency Contact: [name] of [employer] Phone: [phone] Relation: Daughter Interpreter needed? No  Code Status: DNR Goals of Care: Advanced Directive information Advanced Directives 11/22/2020  Does Patient Have a Medical Advance Directive? Yes  Type of Paramedic of [place];Living will  Does patient want to make changes to medical advance directive? No - Patient declined  Copy of [place] in Chart? No - copy requested      Chief Complaint  Patient presents with  . New Admit To SNF    Admission to SNF    HPI: Patient is a 87 y.o. male seen today for admission to SNF for Jeffery Rogers Patient has a history of A. fib not on any anticoagulation due to hematuria next CHB s/p pacer, HLD, History of bilateral hand tremors.  And progressive dementia Chronic Foley catheters/p TAVR Was admitted in the hospital from 2/18-2/23  for altered mental status.  Patient was staying with his wife but for past few weeks getting more weaker unable to walk CT scan and MRI were negative for any acute infarct.  It just showed generalized atrophy  MRI of the spine showed L2 4 5 with moderate stenosis. Patient has chronic indwelling catheter since the beginning of this year.  Her Eliquis was stopped due to recurrent hematuria. Patient also had some signs of dysphagia.  Speech recommended dysphagia 1 diet. Due to continued  decline patient's family has moved him to SNF.  He would get therapy.  But would state comfort care. Patient did not have any acute issues today.  He was able to answer my questions appropriately.  Did have a tremor in his right hand.  Was not oriented to place and time.  Did  know his date of birth.  Has not had any behavior issues  Patient is now off many of his medications including Aricept, Namenda, Bactrim, and Zoloft, Flomax, Lipitor and Eliquis and Abilify  Past Medical History:  Diagnosis Date  . Arthritis    "right arm/shoulder" (02/10/2016)  . Complete heart block ([place]) 02/10/2016   a. s/p Emergency planning/management officer  . Goiter   . Hypercholesteremia   . Memory loss   . Nasal septal deviation   . Persistent atrial fibrillation ([place])   . Presence of permanent cardiac pacemaker   . 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 Severe aortic stenosis   . Tremor    Past Surgical History:  Procedure Laterality Date  . CATARACT EXTRACTION W/ INTRAOCULAR LENS  IMPLANT, BILATERAL Bilateral ~ 2000  . EP IMPLANTABLE DEVICE N/A 02/11/2016   Procedure: Pacemaker Implant;  Surgeon: Mowers Lance, MD;  Location: [facility];  Service: Cardiovascular;  Laterality: N/A;  . RIGHT/LEFT HEART CATH AND CORONARY ANGIOGRAPHY N/A 05/03/2018   Procedure: RIGHT/LEFT HEART CATH AND CORONARY ANGIOGRAPHY;  Surgeon: Burnell Blanks, MD;  Location: [facility]  INVASIVE CV LAB;  Service: Cardiovascular;  Laterality: N/A;  . TEE WITHOUT CARDIOVERSION N/A 05/30/2018   Procedure: TRANSESOPHAGEAL ECHOCARDIOGRAM (TEE);  Surgeon: Burnell Blanks, MD;  Location: [facility];  Service: Open Heart Surgery;  Laterality: N/A;  . TONSILLECTOMY  1930s  . TRANSCATHETER AORTIC VALVE REPLACEMENT, TRANSFEMORAL N/A 05/30/2018   Procedure: TRANSCATHETER AORTIC VALVE REPLACEMENT, TRANSFEMORAL using a 64mm  Edwards Sapien 3 Aortic Valve;  Surgeon: Burnell Blanks, MD;  Location: [facility];  Service: Open Heart Surgery;  Laterality: N/A;  . [place]    reports that he has quit smoking. His smoking use included cigarettes. He has a 16.00 pack-year smoking history. He has never used smokeless tobacco. He reports that he does not drink alcohol and does not use drugs. Social History   Socioeconomic History  . Marital status: Married    Spouse name: Not on file  . Number of children: 2  . Years of education: Bachelors  . Highest education level: Not on file  Occupational History  . Occupation: Retired Chief Financial Officer  Tobacco Use  . Smoking status: Former Smoker    Packs/day: 1.00    Years: 16.00    Pack years: 16.00    Types: Cigarettes  . Smokeless tobacco: Never Used  . Tobacco comment: Quit smoking cigarettes in 1971  Vaping Use  . Vaping Use: Never used  Substance and Sexual Activity  . Alcohol use: Never    Alcohol/week: 2.0 standard drinks    Types: 1 Cans of beer, 1 Shots of liquor per week  . Drug use: No  . Sexual activity: Yes  Other Topics Concern  . Not on file  Social History Narrative   Lives at home with his wife.   Right-handed.   1 cup coffee per day.  Occasional Coke.   Social Determinants of Health   Financial Resource Strain: Not on file  Food Insecurity: Not on file  Transportation Needs: Not on file  Physical Activity: Not on file  Stress: Not on file  Social Connections: Not on file  Intimate Partner Violence: Not on file    Functional Status Survey:    Family History  Problem Relation Age of Onset  . Stroke Other   . Irregular heart beat Other   . Stroke Mother   . Stroke Father   . CAD Father        MI    Health Maintenance  Topic Date Due  . COVID-19 Vaccine (1) Never done  . TETANUS/TDAP  11/13/2028  . INFLUENZA VACCINE  Completed  . PNA vac Low Risk Adult  Completed    No Known Allergies  Allergies as of  11/28/2020   No Known Allergies     Medication List       Accurate as of November 28, 2020 10:32 AM. If you have any questions, ask your nurse or doctor.        STOP taking these medications   meloxicam 7.5 MG tablet Commonly known as: MOBIC Stopped by: Virgie Dad, MD   sulfamethoxazole-trimethoprim 800-160 MG tablet Commonly known as: BACTRIM DS Stopped by: Virgie Dad, MD     TAKE these medications   acetaminophen 500 MG tablet Commonly known as: TYLENOL Take 500-1,000 mg by mouth every 6 (six) hours as needed for mild pain, fever or headache.   ALPRAZolam 0.25 MG tablet Commonly known as: XANAX Take 0.25 mg by mouth 3 (three) times daily as needed for anxiety.   amLODipine  5 MG tablet Commonly known as: NORVASC Take 1 tablet by mouth daily. What changed: how much to take   ipratropium 0.03 % nasal spray Commonly known as: ATROVENT Place 2 sprays into both nostrils 4 (four) times daily as needed for rhinitis.   mirtazapine 7.5 MG tablet Commonly known as: REMERON Take 7.5 mg by mouth daily.   oxyCODONE-acetaminophen 5-325 MG tablet Commonly known as: Percocet Take 1-2 tablets by mouth every 6 (six) hours as needed. What changed: reasons to take this   polyvinyl alcohol 1.4 % ophthalmic solution Commonly known as: LIQUIFILM TEARS Place 1 drop into both eyes as needed for dry eyes.   QUEtiapine 50 MG tablet Commonly known as: SEROQUEL Take 2 tablets (100 mg total) by mouth at bedtime.       Review of Systems  Unable to perform ROS: Dementia    Vitals:   11/28/20 1026  BP: 132/64  Pulse: 60  Resp: 16  Temp: (!) 97.2 F (36.2 C)  SpO2: 92%  Weight: 157 lb 11.2 oz (71.5 kg)  Height: 6' (1.829 m)   Body mass index is 21.39 kg/m. Physical Exam Vitals reviewed.  Constitutional:      Appearance: Normal appearance.  HENT:     Head: Normocephalic.     Nose: Nose normal.     Mouth/Throat:     Mouth: Mucous membranes are moist.      Pharynx: Oropharynx is clear.  Eyes:     Pupils: Pupils are equal, round, and reactive to light.  Cardiovascular:     Rate and Rhythm: Normal rate and regular rhythm.     Pulses: Normal pulses.     Heart sounds: Normal heart sounds.  Pulmonary:     Effort: Pulmonary effort is normal.     Breath sounds: Normal breath sounds.  Abdominal:     General: Abdomen is flat. Bowel sounds are normal.     Palpations: Abdomen is soft.  Musculoskeletal:        General: No swelling.     Cervical back: Neck supple.  Skin:    General: Skin is warm and dry.  Neurological:     Mental Status: He is alert.     Comments: Has stiffness in both UE and Tremors. Right more then left. Masked like Face  Psychiatric:        Mood and Affect: Mood normal.        Thought Content: Thought content normal.     Labs reviewed: Basic Metabolic Panel: Recent Labs    11/20/20 1123 11/21/20 1034 11/22/20 0514  NA 135 134* 136  K 4.7 4.3 4.5  CL 98 100 103  CO2 18* 23 24  GLUCOSE 83 71 84  BUN 23 24* 19  CREATININE 1.26 1.24 1.06  CALCIUM 9.5 9.3 8.8*   Liver Function Tests: Recent Labs    11/20/20 1123 11/21/20 1034 11/22/20 0514  AST 52* 60* 55*  ALT 40 48* 40  ALKPHOS 133* 105 83  BILITOT 0.6 0.9 0.9  PROT 6.9 7.5 6.2*  ALBUMIN 4.3 4.2 3.4*   Recent Labs    11/13/20 1916  LIPASE 32   No results for input(s): AMMONIA in the last 8760 hours. CBC: Recent Labs    11/20/20 1123 11/21/20 1034 11/22/20 0514  WBC 9.3 8.0 7.7  NEUTROABS 7.0 6.1 5.6  HGB 14.9 15.7 13.3  HCT 43.5 48.4 40.7  MCV 92 95.3 95.3  PLT 168 151 140*   Cardiac Enzymes: Recent Labs  11/21/20 1034 11/22/20 0514  CKTOTAL 461* 546*   BNP: Invalid input(s): POCBNP Lab Results  Component Value Date   HGBA1C 5.8 (H) 11/22/2020   Lab Results  Component Value Date   TSH 2.277 11/21/2020   Lab Results  Component Value Date   VITAMINB12 2,359 (H) 11/22/2020   Lab Results  Component Value Date   FOLATE  57.8 11/21/2020   No results found for: IRON, TIBC, FERRITIN  Imaging and Procedures obtained prior to SNF admission: CT Angio Chest PE W and/or Wo Contrast  Result Date: 11/21/2020 CLINICAL DATA:  Chest and left flank pain. Hypoxia and altered mental status. EXAM: CT ANGIOGRAPHY CHEST CT ABDOMEN AND PELVIS WITH CONTRAST TECHNIQUE: Multidetector CT imaging of the chest was performed using the standard protocol during bolus administration of intravenous contrast. Multiplanar CT image reconstructions and MIPs were obtained to evaluate the vascular anatomy. Multidetector CT imaging of the abdomen and pelvis was performed using the standard protocol during bolus administration of intravenous contrast. CONTRAST:  13mL OMNIPAQUE IOHEXOL 350 MG/ML SOLN COMPARISON:  CT a chest, abdomen, and pelvis dated November 13, 2020. FINDINGS: CTA CHEST FINDINGS Cardiovascular: Satisfactory opacification of the pulmonary arteries to the segmental level. No evidence of pulmonary embolism. Stable cardiomegaly status post TAVR. No pericardial effusion. Unchanged mild aneurysmal dilatation of the mid ascending thoracic aorta measuring up to 4.0 cm. Coronary, aortic arch, and branch vessel atherosclerotic vascular disease. Unchanged left chest wall pacemaker. Mediastinum/Nodes: No enlarged mediastinal, hilar, or axillary lymph nodes. Unchanged 2.0 cm partially calcified hypodense nodule in the right thyroid lobe This has been evaluated on previous imaging. The trachea and esophagus demonstrate no significant findings. Lungs/Pleura: Minimal dependent subsegmental atelectasis in both lower lobes. No focal consolidation, pleural effusion, or pneumothorax. No suspicious pulmonary nodule. Biapical pleuroparenchymal scarring, greater on the right. Musculoskeletal: No chest wall abnormality. No acute or significant osseous findings. Review of the MIP images confirms the above findings. CT ABDOMEN AND PELVIS FINDINGS Hepatobiliary: No focal  liver abnormality. Unchanged gallstone. No gallbladder wall thickening or biliary dilatation. Probable adenomyomatosis at the gallbladder fundus. Pancreas: Unremarkable. No pancreatic ductal dilatation or surrounding inflammatory changes. Spleen: Normal in size without focal abnormality. Adrenals/Urinary Tract: Adrenal glands are unremarkable. Unchanged bilateral renal cysts. No renal calculi or hydronephrosis. The bladder is decompressed by Foley catheter. Stomach/Bowel: Stomach is within normal limits. Appendix appears normal. No evidence of bowel wall thickening, distention, or inflammatory changes. Redundant sigmoid colon. Vascular/Lymphatic: Aortic atherosclerosis. No enlarged abdominal or pelvic lymph nodes. Reproductive: Unchanged mild prostatomegaly. Other: No abdominal wall hernia or abnormality. No abdominopelvic ascites. No pneumoperitoneum. Musculoskeletal: No acute or significant osseous findings. Review of the MIP images confirms the above findings. IMPRESSION: Chest: 1. No evidence of pulmonary embolism. No acute intrathoracic process. 2. Unchanged mild aneurysmal dilatation of the mid ascending thoracic aorta measuring up to 4.0 cm. Recommend annual imaging followup by CTA or MRA. This recommendation follows 2010 ACCF/AHA/AATS/ACR/ASA/SCA/SCAI/SIR/STS/SVM Guidelines for the Diagnosis and Management of Patients with Thoracic Aortic Disease. Circulation. 2010; 121: H417-E081. Aortic aneurysm NOS (ICD10-I71.9) 3.  Aortic atherosclerosis (ICD10-I70.0). Abdomen and pelvis: 1. No acute intra-abdominal process. 2. Unchanged cholelithiasis. Electronically Signed   By: Titus Dubin M.D.   On: 11/21/2020 13:07   MR BRAIN WO CONTRAST  Result Date: 11/21/2020 CLINICAL DATA:  Dementia.  Recent worsening.  Weakness. EXAM: MRI HEAD WITHOUT CONTRAST TECHNIQUE: Multiplanar, multiecho pulse sequences of the brain and surrounding structures were obtained without intravenous contrast. COMPARISON:  Head CT  yesterday. FINDINGS: Brain:  Diffusion imaging does not show any acute or subacute infarction. Chronic small-vessel ischemic changes affect the pons. Few old small vessel cerebellar infarctions. Cerebral hemispheres show generalized atrophy, possibly with temporal lobe predominance. There is extensive chronic small-vessel ischemic change of the hemispheric white matter. 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 No evidence of mass lesion, hemorrhage, hydrocephalus or extra-axial collection. Vascular: Major vessels at the base of the brain show flow. Ectasia of the basilar tip as seen previously with maximal diameter 6.4 mm. Skull and upper cervical spine: Negative Sinuses/Orbits: Rhinosinusitis of the right maxillary sinus. Orbits negative. Other: None IMPRESSION: 1. No acute or reversible finding. Generalized atrophy, possibly with some temporal lobe predominance. Extensive chronic small-vessel ischemic changes throughout the brain. 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 2. Ectasia of the basilar tip as seen previously with maximal diameter 6.4 mm. Electronically Signed   By: Nelson Chimes M.D.   On: 11/21/2020 19:15   MR LUMBAR SPINE WO CONTRAST  Result Date: 11/21/2020 CLINICAL DATA:  Increasing leg weakness developing over the past 3-4 weeks. EXAM: MRI LUMBAR SPINE WITHOUT CONTRAST TECHNIQUE: Multiplanar, multisequence MR imaging of the lumbar spine was performed. No intravenous contrast was administered. COMPARISON:  MRI lumbar spine 06/17/2012. FINDINGS: Segmentation:  Standard. Alignment:  No listhesis. Vertebrae: No fracture, evidence of discitis, or bone lesion. Degenerative endplate signal change W2-3 and L5-S1 has progressed since the prior exam. Conus medullaris and cauda equina: Conus extends to the L1 level. Conus and cauda equina appear normal. Paraspinal and other soft tissues: T2 hyperintense lesion in the right kidney is most consistent with a cyst. Disc levels: T12-L1: Negative. L1-2: Negative.  L2-3: Negative. L3-4: Shallow disc bulge and mild facet degenerative change. No stenosis. L4-5: There has been progression of spondylosis at this level. Loss of disc space height with a disc bulge, ligamentum flavum thickening and moderate facet arthropathy are seen. There is moderate central canal stenosis and mild narrowing in the right subarticular recess. Mild foraminal narrowing is more notable on the right. L5-S1: There has been some progression of disease at this level. Loss of disc space height with a bulge eccentric to the left. The central canal is open. Mild to moderate bilateral foraminal narrowing. IMPRESSION: Some progression of spondylosis at L4-5 where there is moderate central canal stenosis and mild narrowing in the right subarticular recess. Mild bilateral foraminal narrowing is more notable on the right at this level. Some progression of spondylosis at L5-S1 where there is mild to moderate bilateral foraminal narrowing. The central canal is patent. Electronically Signed   By: Inge Rise M.D.   On: 11/21/2020 19:20   CT ABDOMEN PELVIS W CONTRAST  Result Date: 11/21/2020 CLINICAL DATA:  Chest and left flank pain. Hypoxia and altered mental status. EXAM: CT ANGIOGRAPHY CHEST CT ABDOMEN AND PELVIS WITH CONTRAST TECHNIQUE: Multidetector CT imaging of the chest was performed using the standard protocol during bolus administration of intravenous contrast. Multiplanar CT image reconstructions and MIPs were obtained to evaluate the vascular anatomy. Multidetector CT imaging of the abdomen and pelvis was performed using the standard protocol during bolus administration of intravenous contrast. CONTRAST:  134mL OMNIPAQUE IOHEXOL 350 MG/ML SOLN COMPARISON:  CT a chest, abdomen, and pelvis dated November 13, 2020. FINDINGS: CTA CHEST FINDINGS Cardiovascular: Satisfactory opacification of the pulmonary arteries to the segmental level. No evidence of pulmonary embolism. Stable cardiomegaly status post  TAVR. No pericardial effusion. Unchanged mild aneurysmal dilatation of the mid ascending thoracic aorta measuring up to 4.0 cm. Coronary, aortic arch,  and branch vessel atherosclerotic vascular disease. Unchanged left chest wall pacemaker. Mediastinum/Nodes: No enlarged mediastinal, hilar, or axillary lymph nodes. Unchanged 2.0 cm partially calcified hypodense nodule in the right thyroid lobe This has been evaluated on previous imaging. The trachea and esophagus demonstrate no significant findings. Lungs/Pleura: Minimal dependent subsegmental atelectasis in both lower lobes. No focal consolidation, pleural effusion, or pneumothorax. No suspicious pulmonary nodule. Biapical pleuroparenchymal scarring, greater on the right. Musculoskeletal: No chest wall abnormality. No acute or significant osseous findings. Review of the MIP images confirms the above findings. CT ABDOMEN AND PELVIS FINDINGS Hepatobiliary: No focal liver abnormality. Unchanged gallstone. No gallbladder wall thickening or biliary dilatation. Probable adenomyomatosis at the gallbladder fundus. Pancreas: Unremarkable. No pancreatic ductal dilatation or surrounding inflammatory changes. Spleen: Normal in size without focal abnormality. Adrenals/Urinary Tract: Adrenal glands are unremarkable. Unchanged bilateral renal cysts. No renal calculi or hydronephrosis. The bladder is decompressed by Foley catheter. Stomach/Bowel: Stomach is within normal limits. Appendix appears normal. No evidence of bowel wall thickening, distention, or inflammatory changes. Redundant sigmoid colon. Vascular/Lymphatic: Aortic atherosclerosis. No enlarged abdominal or pelvic lymph nodes. Reproductive: Unchanged mild prostatomegaly. Other: No abdominal wall hernia or abnormality. No abdominopelvic ascites. No pneumoperitoneum. Musculoskeletal: No acute or significant osseous findings. Review of the MIP images confirms the above findings. IMPRESSION: Chest: 1. No evidence of  pulmonary embolism. No acute intrathoracic process. 2. Unchanged mild aneurysmal dilatation of the mid ascending thoracic aorta measuring up to 4.0 cm. Recommend annual imaging followup by CTA or MRA. This recommendation follows 2010 ACCF/AHA/AATS/ACR/ASA/SCA/SCAI/SIR/STS/SVM Guidelines for the Diagnosis and Management of Patients with Thoracic Aortic Disease. Circulation. 2010; 121: P546-F681. Aortic aneurysm NOS (ICD10-I71.9) 3.  Aortic atherosclerosis (ICD10-I70.0). Abdomen and pelvis: 1. No acute intra-abdominal process. 2. Unchanged cholelithiasis. Electronically Signed   By: Titus Dubin M.D.   On: 11/21/2020 13:07   DG Chest Portable 1 View  Result Date: 11/21/2020 CLINICAL DATA:  87 year old male with history of altered mental status. Weakness. EXAM: PORTABLE CHEST 1 VIEW COMPARISON:  Chest x-ray 11/13/2020. FINDINGS: Lung volumes are normal. No consolidative airspace disease. No pleural effusions. No suspicious appearing pulmonary nodules or masses are noted. No pneumothorax. No evidence of pulmonary edema. Mild cardiomegaly. Upper mediastinal contours are within normal limits. Atherosclerotic calcifications in the thoracic aorta. Status post TAVR. Left-sided pacemaker device in place with lead tips projecting over the expected location of the right atrium and right ventricle. IMPRESSION: 1. No radiographic evidence of acute cardiopulmonary disease. 2. Mild cardiomegaly. 3. Aortic atherosclerosis. Electronically Signed   By: Vinnie Langton M.D.   On: 11/21/2020 10:54    Assessment/Plan Essential hypertension Continue on Norvasc  Late onset Alzheimer's disease with behavioral disturbance (HCC) On high doses of Seroquel Was on admit Abilify before also We will plan to eventually slowly taper him off the Seroquel.  Once he gets adjusted to the facility He was also taken off the Namenda and Aricept  Dysphagia, unspecified type On Dysphagic Nectar thick  diet Persistent atrial  fibrillation (HCC) Off Eliquis due to Hematuria Chronic indwelling Foley catheter Since this year Per Urolology Adult failure to thrive On Remeron. Supportive care Family wants Comfort care Is enrolled in Therapy Hypercholesteremia Off statin    Family/ staff Communication:   Labs/tests ordered:

## 2020-12-01 ENCOUNTER — Encounter: Payer: Self-pay | Admitting: Nurse Practitioner

## 2020-12-02 ENCOUNTER — Non-Acute Institutional Stay (SKILLED_NURSING_FACILITY): Payer: Medicare Other | Admitting: Internal Medicine

## 2020-12-02 ENCOUNTER — Encounter: Payer: Self-pay | Admitting: Internal Medicine

## 2020-12-02 DIAGNOSIS — G301 Alzheimer's disease with late onset: Secondary | ICD-10-CM | POA: Diagnosis not present

## 2020-12-02 DIAGNOSIS — Z978 Presence of other specified devices: Secondary | ICD-10-CM | POA: Diagnosis not present

## 2020-12-02 DIAGNOSIS — F0281 Dementia in other diseases classified elsewhere with behavioral disturbance: Secondary | ICD-10-CM

## 2020-12-02 NOTE — Progress Notes (Signed)
Location:   [facility] Room Number: 32 Place of Service:  SNF [phone]) Provider:  Veleta Miners MD  [name], MD  Patient Care Team: [name], MD as PCP - General (Internal Medicine) Monna Fam, MD as Consulting Physician (Ophthalmology)  Extended Emergency Contact Information Primary Emergency Contact: [name] Address: [address]          [place], [place] [address] Johnnette Litter of [place] Phone: [phone] Relation: Spouse Secondary Emergency Contact: [name] of [employer] Phone: [phone] Relation: Daughter Interpreter needed? No  Code Status:  DNR  Goals of care: Advanced Directive information Advanced Directives 11/22/2020  Does Patient Have a Medical Advance Directive? Yes  Type of Paramedic of [place];Living will  Does patient want to make changes to medical advance directive? No - Patient declined  Copy of [place] in Chart? No - copy requested     Chief Complaint  Patient presents with  . Acute Visit    Foley catheter    HPI:  Pt is a 87 y.o. male seen today for an acute visit for Possible Voiding trial  SNF for Long Term Care Patient has a history of A. fib not on any anticoagulation due to hematuria next CHB s/p pacer, HLD, History of bilateral hand tremors.  And progressive dementia Chronic Foley catheters/p TAVR Was admitted in the hospital from 2/18-2/23  for altered mental status.  Has Chronic Foley Cathter placed in Jan of this year. Was on Flomax before but ? Not taking it Patient unable to give any history due to his dementia     Past Medical History:  Diagnosis Date  . Arthritis    "right arm/shoulder" (02/10/2016)  . Complete heart block ([facility]) 02/10/2016   a. s/p Emergency planning/management officer  . Goiter   . Hypercholesteremia   . Memory loss   . Nasal septal deviation   . Persistent atrial fibrillation ([facility])    . Presence of permanent cardiac pacemaker   . 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 Severe aortic stenosis   . Tremor    Past Surgical History:  Procedure Laterality Date  . CATARACT EXTRACTION W/ INTRAOCULAR LENS  IMPLANT, BILATERAL Bilateral ~ 2000  . EP IMPLANTABLE DEVICE N/A 02/11/2016   Procedure: Pacemaker Implant;  Surgeon: Wedemeyer Lance, MD;  Location: [facility];  Service: Cardiovascular;  Laterality: N/A;  . RIGHT/LEFT HEART CATH AND CORONARY ANGIOGRAPHY N/A 05/03/2018   Procedure: RIGHT/LEFT HEART CATH AND CORONARY ANGIOGRAPHY;  Surgeon: Burnell Blanks, MD;  Location: [facility];  Service: Cardiovascular;  Laterality: N/A;  . TEE WITHOUT CARDIOVERSION N/A 05/30/2018   Procedure: TRANSESOPHAGEAL ECHOCARDIOGRAM (TEE);  Surgeon: Burnell Blanks, MD;  Location: [facility];  Service: Open Heart Surgery;  Laterality: N/A;  . TONSILLECTOMY  1930s  . TRANSCATHETER AORTIC VALVE REPLACEMENT, TRANSFEMORAL N/A 05/30/2018   Procedure: TRANSCATHETER AORTIC VALVE REPLACEMENT, TRANSFEMORAL using a 13mm Edwards Sapien 3 Aortic Valve;  Surgeon: Burnell Blanks, MD;  Location: [facility];  Service: Open Heart Surgery;  Laterality: N/A;  . WISDOM TOOTH EXTRACTION  1990s    No Known Allergies  Allergies as of 12/02/2020   No Known Allergies     Medication List       Accurate as of December 02, 2020 11:02 AM. If you  have any questions, ask your nurse or doctor.        acetaminophen 500 MG tablet Commonly known as: TYLENOL Take 500-1,000 mg by mouth every 6 (six) hours as needed for mild pain, fever or headache.   acetaminophen 500 MG tablet Commonly known as: TYLENOL Take 500 mg by mouth daily.   ALPRAZolam 0.25 MG tablet Commonly known as: XANAX Take 0.25 mg by mouth 3 (three) times daily as needed for anxiety.   amLODipine 5 MG  tablet Commonly known as: NORVASC Take 1 tablet by mouth daily.   ipratropium 0.03 % nasal spray Commonly known as: ATROVENT Place 2 sprays into both nostrils 4 (four) times daily as needed for rhinitis.   mirtazapine 7.5 MG tablet Commonly known as: REMERON Take 7.5 mg by mouth daily.   oxyCODONE-acetaminophen 5-325 MG tablet Commonly known as: Percocet Take 1-2 tablets by mouth every 6 (six) hours as needed. What changed: reasons to take this   polyvinyl alcohol 1.4 % ophthalmic solution Commonly known as: LIQUIFILM TEARS Place 1 drop into both eyes as needed for dry eyes.   QUEtiapine 50 MG tablet Commonly known as: SEROQUEL Take 2 tablets (100 mg total) by mouth at bedtime.       Review of Systems  Unable to perform ROS: Dementia    Immunization History  Administered Date(s) Administered  . Influenza, High Dose Seasonal PF 08/01/2014, 06/16/2015, 06/17/2016  . Influenza, Quadrivalent, Recombinant, Inj, Pf 07/26/2017, 07/21/2018, 06/08/2019, 07/02/2020  . Influenza-Unspecified 07/20/2013  . Pneumococcal Conjugate-13 01/16/2013  . Pneumococcal Polysaccharide-23 01/07/1999  . Td 11/13/2018  . Tdap 01/11/2012  . Zoster 09/07/2006  . Zoster Recombinat (Shingrix) 02/27/2019, 02/27/2019   Pertinent  Health Maintenance Due  Topic Date Due  . INFLUENZA VACCINE  Completed  . PNA vac Low Risk Adult  Completed   Fall Risk  03/06/2019  Falls in the past year? 1  Number falls in past yr: 1  Injury with Fall? 0   Functional Status Survey:    Vitals:   12/02/20 1056  BP: 138/86  Pulse: 69  Resp: 18  Temp: (!) 97.2 F (36.2 C)  SpO2: 92%  Weight: 157 lb 11.2 oz (71.5 kg)  Height: 6' (1.829 m)   Body mass index is 21.39 kg/m. Physical Exam  Constitutional:  Well-developed and well-nourished.  HENT:  Head: Normocephalic.  Mouth/Throat: Oropharynx is clear and moist.  Eyes: Pupils are equal, round, and reactive to light.  Neck: Neck supple.  Cardiovascular:  Normal rate and normal heart sounds.  No murmur heard. Pulmonary/Chest: Effort normal and breath sounds normal. No respiratory distress. No wheezes. Has no rales.  Abdominal: Soft. Bowel sounds are normal. No distension. There is no tenderness. There is no rebound.  Musculoskeletal: No edema.  Lymphadenopathy: none Neurological: Has stiffness in both UE and Tremors. Right more then left. Masked like Face  Skin: Skin is warm and dry.  Psychiatric: Normal mood and affect. Behavior is normal. Thought content normal.    Labs reviewed: Recent Labs    11/20/20 1123 11/21/20 1034 11/22/20 0514  NA 135 134* 136  K 4.7 4.3 4.5  CL 98 100 103  CO2 18* 23 24  GLUCOSE 83 71 84  BUN 23 24* 19  CREATININE 1.26 1.24 1.06  CALCIUM 9.5 9.3 8.8*   Recent Labs    11/20/20 1123 11/21/20 1034 11/22/20 0514  AST 52* 60* 55*  ALT 40 48* 40  ALKPHOS 133* 105 83  BILITOT 0.6 0.9 0.9  PROT 6.9 7.5 6.2*  ALBUMIN 4.3 4.2 3.4*   Recent Labs    11/20/20 1123 11/21/20 1034 11/22/20 0514  WBC 9.3 8.0 7.7  NEUTROABS 7.0 6.1 5.6  HGB 14.9 15.7 13.3  HCT 43.5 48.4 40.7  MCV 92 95.3 95.3  PLT 168 151 140*   Lab Results  Component Value Date   TSH 2.277 11/21/2020   Lab Results  Component Value Date   HGBA1C 5.8 (H) 11/22/2020   Lab Results  Component Value Date   CHOL 127 11/22/2020   HDL 52 11/22/2020   LDLCALC 65 11/22/2020   TRIG 50 11/22/2020   CHOLHDL 2.4 11/22/2020    Significant Diagnostic Results in last 30 days:  DG Chest 2 View  Result Date: 11/13/2020 CLINICAL DATA:  Right upper back pain. EXAM: CHEST - 2 VIEW COMPARISON:  Chest x-ray dated April 16, 2019. FINDINGS: Unchanged left chest wall pacemaker. Interval enlargement of the cardiopericardial silhouette with a globular appearance. Prior TAVR. Normal pulmonary vascularity. No focal consolidation, pleural effusion, or pneumothorax. No acute osseous abnormality. IMPRESSION: 1. Interval enlargement of the  cardiopericardial silhouette with a globular appearance, raising concern for pericardial effusion. Electronically Signed   By: Titus Dubin M.D.   On: 11/13/2020 19:55   CT HEAD WO CONTRAST  Result Date: 11/21/2020 CLINICAL DATA:  87 year old male with progressive altered mental status, generalized weakness. This exam originally designated as [facility] to report, however, the [facility] Emergency Department contacted me by phone requesting urgent interpretation. EXAM: CT HEAD WITHOUT CONTRAST TECHNIQUE: Contiguous axial images were obtained from the base of the skull through the vertex without intravenous contrast. COMPARISON:  Head CT 01/14/2017. FINDINGS: Brain: Encephalomalacia at the left occipital pole is new since 2018 and associated with mild interval enlargement of the left occipital horn. Patchy bilateral cerebral white matter hypodensity has progressed since 2018, most notably the posterior limb right internal capsule on series 2, image 19. No midline shift, ventriculomegaly, mass effect, evidence of mass lesion, intracranial hemorrhage or evidence of cortically based acute infarction. Vascular: Calcified atherosclerosis at the skull base. Evidence of a small unruptured 6-7 mm basilar tip aneurysm or focal vessel ectasia (coronal image 38), probably not significantly changed since 2018. No suspicious intracranial vascular hyperdensity. Skull: No acute osseous abnormality identified. Sinuses/Orbits: Chronic right maxillary sinusitis with mucoperiosteal thickening and inspissated material. Other Visualized paranasal sinuses and mastoids are clear. Other: No acute orbit or scalp soft tissue finding identified. IMPRESSION: 1. No acute intracranial abnormality by CT. - small Left PCA infarct at the left occipital pole is new since 2018 but appears chronic. - Progressed cerebral white matter disease since 2018, most likely due to small vessel ischemia. - possible unruptured Aneurysm at  the Basilar tip, 6-7 mm and likely stable since 2018. 2. Chronic right maxillary sinusitis. The above was discussed by telephone on 11/21/2020 at 1019 hours with ED provider Georga Kaufmann. Electronically Signed   By: Genevie Ann M.D.   On: 11/21/2020 10:32   CT Angio Chest PE W and/or Wo Contrast  Result Date: 11/21/2020 CLINICAL DATA:  Chest and left flank pain. Hypoxia and altered mental status. EXAM: CT ANGIOGRAPHY CHEST CT ABDOMEN AND PELVIS WITH CONTRAST TECHNIQUE: Multidetector CT imaging of the chest was performed using the standard protocol during bolus administration of intravenous contrast. Multiplanar CT image reconstructions and MIPs were obtained to evaluate the vascular anatomy. Multidetector CT imaging of the abdomen and pelvis was performed using the standard protocol during bolus administration  of intravenous contrast. CONTRAST:  17mL OMNIPAQUE IOHEXOL 350 MG/ML SOLN COMPARISON:  CT a chest, abdomen, and pelvis dated November 13, 2020. FINDINGS: CTA CHEST FINDINGS Cardiovascular: Satisfactory opacification of the pulmonary arteries to the segmental level. No evidence of pulmonary embolism. Stable cardiomegaly status post TAVR. No pericardial effusion. Unchanged mild aneurysmal dilatation of the mid ascending thoracic aorta measuring up to 4.0 cm. Coronary, aortic arch, and branch vessel atherosclerotic vascular disease. Unchanged left chest wall pacemaker. Mediastinum/Nodes: No enlarged mediastinal, hilar, or axillary lymph nodes. Unchanged 2.0 cm partially calcified hypodense nodule in the right thyroid lobe This has been evaluated on previous imaging. The trachea and esophagus demonstrate no significant findings. Lungs/Pleura: Minimal dependent subsegmental atelectasis in both lower lobes. No focal consolidation, pleural effusion, or pneumothorax. No suspicious pulmonary nodule. Biapical pleuroparenchymal scarring, greater on the right. Musculoskeletal: No chest wall abnormality. No acute or  significant osseous findings. Review of the MIP images confirms the above findings. CT ABDOMEN AND PELVIS FINDINGS Hepatobiliary: No focal liver abnormality. Unchanged gallstone. No gallbladder wall thickening or biliary dilatation. Probable adenomyomatosis at the gallbladder fundus. Pancreas: Unremarkable. No pancreatic ductal dilatation or surrounding inflammatory changes. Spleen: Normal in size without focal abnormality. Adrenals/Urinary Tract: Adrenal glands are unremarkable. Unchanged bilateral renal cysts. No renal calculi or hydronephrosis. The bladder is decompressed by Foley catheter. Stomach/Bowel: Stomach is within normal limits. Appendix appears normal. No evidence of bowel wall thickening, distention, or inflammatory changes. Redundant sigmoid colon. Vascular/Lymphatic: Aortic atherosclerosis. No enlarged abdominal or pelvic lymph nodes. Reproductive: Unchanged mild prostatomegaly. Other: No abdominal wall hernia or abnormality. No abdominopelvic ascites. No pneumoperitoneum. Musculoskeletal: No acute or significant osseous findings. Review of the MIP images confirms the above findings. IMPRESSION: Chest: 1. No evidence of pulmonary embolism. No acute intrathoracic process. 2. Unchanged mild aneurysmal dilatation of the mid ascending thoracic aorta measuring up to 4.0 cm. Recommend annual imaging followup by CTA or MRA. This recommendation follows 2010 ACCF/AHA/AATS/ACR/ASA/SCA/SCAI/SIR/STS/SVM Guidelines for the Diagnosis and Management of Patients with Thoracic Aortic Disease. Circulation. 2010; 121: I696-E952. Aortic aneurysm NOS (ICD10-I71.9) 3.  Aortic atherosclerosis (ICD10-I70.0). Abdomen and pelvis: 1. No acute intra-abdominal process. 2. Unchanged cholelithiasis. Electronically Signed   By: Titus Dubin M.D.   On: 11/21/2020 13:07   MR BRAIN WO CONTRAST  Result Date: 11/21/2020 CLINICAL DATA:  Dementia.  Recent worsening.  Weakness. EXAM: MRI HEAD WITHOUT CONTRAST TECHNIQUE: Multiplanar,  multiecho pulse sequences of the brain and surrounding structures were obtained without intravenous contrast. COMPARISON:  Head CT yesterday. FINDINGS: Brain: Diffusion imaging does not show any acute or subacute infarction. Chronic small-vessel ischemic changes affect the pons. Few old small vessel cerebellar infarctions. Cerebral hemispheres show generalized atrophy, possibly with temporal lobe predominance. There is extensive chronic small-vessel ischemic change of the hemispheric white matter. 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 No evidence of mass lesion, hemorrhage, hydrocephalus or extra-axial collection. Vascular: Major vessels at the base of the brain show flow. Ectasia of the basilar tip as seen previously with maximal diameter 6.4 mm. Skull and upper cervical spine: Negative Sinuses/Orbits: Rhinosinusitis of the right maxillary sinus. Orbits negative. Other: None IMPRESSION: 1. No acute or reversible finding. Generalized atrophy, possibly with some temporal lobe predominance. Extensive chronic small-vessel ischemic changes throughout the brain. 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 2. Ectasia of the basilar tip as seen previously with maximal diameter 6.4 mm. Electronically Signed   By: Nelson Chimes M.D.   On: 11/21/2020 19:15   MR LUMBAR SPINE WO CONTRAST  Result Date: 11/21/2020 CLINICAL  DATA:  Increasing leg weakness developing over the past 3-4 weeks. EXAM: MRI LUMBAR SPINE WITHOUT CONTRAST TECHNIQUE: Multiplanar, multisequence MR imaging of the lumbar spine was performed. No intravenous contrast was administered. COMPARISON:  MRI lumbar spine 06/17/2012. FINDINGS: Segmentation:  Standard. Alignment:  No listhesis. Vertebrae: No fracture, evidence of discitis, or bone lesion. Degenerative endplate signal change Q7-3 and L5-S1 has progressed since the prior exam. Conus medullaris and cauda equina: Conus extends to the L1 level. Conus and cauda equina appear normal. Paraspinal and other soft  tissues: T2 hyperintense lesion in the right kidney is most consistent with a cyst. Disc levels: T12-L1: Negative. L1-2: Negative. L2-3: Negative. L3-4: Shallow disc bulge and mild facet degenerative change. No stenosis. L4-5: There has been progression of spondylosis at this level. Loss of disc space height with a disc bulge, ligamentum flavum thickening and moderate facet arthropathy are seen. There is moderate central canal stenosis and mild narrowing in the right subarticular recess. Mild foraminal narrowing is more notable on the right. L5-S1: There has been some progression of disease at this level. Loss of disc space height with a bulge eccentric to the left. The central canal is open. Mild to moderate bilateral foraminal narrowing. IMPRESSION: Some progression of spondylosis at L4-5 where there is moderate central canal stenosis and mild narrowing in the right subarticular recess. Mild bilateral foraminal narrowing is more notable on the right at this level. Some progression of spondylosis at L5-S1 where there is mild to moderate bilateral foraminal narrowing. The central canal is patent. Electronically Signed   By: Inge Rise M.D.   On: 11/21/2020 19:20   CT ABDOMEN PELVIS W CONTRAST  Result Date: 11/21/2020 CLINICAL DATA:  Chest and left flank pain. Hypoxia and altered mental status. EXAM: CT ANGIOGRAPHY CHEST CT ABDOMEN AND PELVIS WITH CONTRAST TECHNIQUE: Multidetector CT imaging of the chest was performed using the standard protocol during bolus administration of intravenous contrast. Multiplanar CT image reconstructions and MIPs were obtained to evaluate the vascular anatomy. Multidetector CT imaging of the abdomen and pelvis was performed using the standard protocol during bolus administration of intravenous contrast. CONTRAST:  17mL OMNIPAQUE IOHEXOL 350 MG/ML SOLN COMPARISON:  CT a chest, abdomen, and pelvis dated November 13, 2020. FINDINGS: CTA CHEST FINDINGS Cardiovascular: Satisfactory  opacification of the pulmonary arteries to the segmental level. No evidence of pulmonary embolism. Stable cardiomegaly status post TAVR. No pericardial effusion. Unchanged mild aneurysmal dilatation of the mid ascending thoracic aorta measuring up to 4.0 cm. Coronary, aortic arch, and branch vessel atherosclerotic vascular disease. Unchanged left chest wall pacemaker. Mediastinum/Nodes: No enlarged mediastinal, hilar, or axillary lymph nodes. Unchanged 2.0 cm partially calcified hypodense nodule in the right thyroid lobe This has been evaluated on previous imaging. The trachea and esophagus demonstrate no significant findings. Lungs/Pleura: Minimal dependent subsegmental atelectasis in both lower lobes. No focal consolidation, pleural effusion, or pneumothorax. No suspicious pulmonary nodule. Biapical pleuroparenchymal scarring, greater on the right. Musculoskeletal: No chest wall abnormality. No acute or significant osseous findings. Review of the MIP images confirms the above findings. CT ABDOMEN AND PELVIS FINDINGS Hepatobiliary: No focal liver abnormality. Unchanged gallstone. No gallbladder wall thickening or biliary dilatation. Probable adenomyomatosis at the gallbladder fundus. Pancreas: Unremarkable. No pancreatic ductal dilatation or surrounding inflammatory changes. Spleen: Normal in size without focal abnormality. Adrenals/Urinary Tract: Adrenal glands are unremarkable. Unchanged bilateral renal cysts. No renal calculi or hydronephrosis. The bladder is decompressed by Foley catheter. Stomach/Bowel: Stomach is within normal limits. Appendix appears normal. No evidence  of bowel wall thickening, distention, or inflammatory changes. Redundant sigmoid colon. Vascular/Lymphatic: Aortic atherosclerosis. No enlarged abdominal or pelvic lymph nodes. Reproductive: Unchanged mild prostatomegaly. Other: No abdominal wall hernia or abnormality. No abdominopelvic ascites. No pneumoperitoneum. Musculoskeletal: No acute  or significant osseous findings. Review of the MIP images confirms the above findings. IMPRESSION: Chest: 1. No evidence of pulmonary embolism. No acute intrathoracic process. 2. Unchanged mild aneurysmal dilatation of the mid ascending thoracic aorta measuring up to 4.0 cm. Recommend annual imaging followup by CTA or MRA. This recommendation follows 2010 ACCF/AHA/AATS/ACR/ASA/SCA/SCAI/SIR/STS/SVM Guidelines for the Diagnosis and Management of Patients with Thoracic Aortic Disease. Circulation. 2010; 121: L244-W102. Aortic aneurysm NOS (ICD10-I71.9) 3.  Aortic atherosclerosis (ICD10-I70.0). Abdomen and pelvis: 1. No acute intra-abdominal process. 2. Unchanged cholelithiasis. Electronically Signed   By: Titus Dubin M.D.   On: 11/21/2020 13:07   DG Chest Portable 1 View  Result Date: 11/21/2020 CLINICAL DATA:  87 year old male with history of altered mental status. Weakness. EXAM: PORTABLE CHEST 1 VIEW COMPARISON:  Chest x-ray 11/13/2020. FINDINGS: Lung volumes are normal. No consolidative airspace disease. No pleural effusions. No suspicious appearing pulmonary nodules or masses are noted. No pneumothorax. No evidence of pulmonary edema. Mild cardiomegaly. Upper mediastinal contours are within normal limits. Atherosclerotic calcifications in the thoracic aorta. Status post TAVR. Left-sided pacemaker device in place with lead tips projecting over the expected location of the right atrium and right ventricle. IMPRESSION: 1. No radiographic evidence of acute cardiopulmonary disease. 2. Mild cardiomegaly. 3. Aortic atherosclerosis. Electronically Signed   By: Vinnie Langton M.D.   On: 11/21/2020 10:54   ECHOCARDIOGRAM COMPLETE  Result Date: 11/07/2020    ECHOCARDIOGRAM REPORT   Patient Name:   CZAR YSAGUIRRE Date of Exam: 11/07/2020 Medical Rec #:  [id number]       Height:       70.0 in Accession #:    [id number]      Weight:       169.0 lb Date of Birth:  May 08, 1932        BSA:          1.943 m Patient Age:     34 years        BP:           134/76 mmHg Patient Gender: M               HR:           60 bpm. Exam Location:  [facility] Procedure: 2D Echo, Cardiac Doppler and Color Doppler Indications:    Z95.2 TAVR  History:        Patient has prior history of Echocardiogram examinations, most                 recent 11/20/2019. Pacemaker, S/p TAVR (95mm Berniece Pap),                 Arrythmias:Atrial Fibrillation, Signs/Symptoms:Dilated aortic                 root; Risk Factors:Former Smoker and Dyslipidemia.  Sonographer:    Coralyn Helling RDCS Referring Phys: [id number] [place]  Sonographer Comments: Technically difficult study due to poor echo windows. Image acquisition challenging due to patient body habitus. IMPRESSIONS  1. Left ventricular ejection fraction, by estimation, is 55 to 60%. The left ventricle has normal function. The left ventricle has no regional wall motion abnormalities. There is mild concentric left ventricular hypertrophy. Left ventricular diastolic parameters are indeterminate.  2. Right  ventricular systolic function is mildly reduced. The right ventricular size is severely enlarged.  3. Left atrial size was severely dilated.  4. Right atrial size was severely dilated.  5. The mitral valve is degenerative. Mild to moderate mitral valve regurgitation. Severe mitral annular calcification.  6. Tricuspid valve regurgitation is moderate, eccentric and appears to have two jets. Eccentricity may underestimate severity.  7. The aortic valve has been repaired/replaced. Aortic valve regurgitation is mild to moderate. Procedure Date: 05/30/2018. Aortic valve mean gradient measures 16.8 mmHg. Aortic valve Vmax measures 2.88 m/s. Mild to moderate paralvalular leak seen in the  PSAX at the 2-3 O'clock position with small additional jet at the 9 o'clock position. DVI 0.43, with prosthetic aortic valve acceleration time < 100 ms.  8. Aortic dilatation noted. There is mild dilatation of the ascending aorta,  measuring 40 mm. Comparison(s): A prior study was performed on 11/20/2019. Prior images reviewed side by side. Compared to prior similar paravalvular leak, prosthetic valve measures, and ascending aorta dilation. Right ventricle has dilated further from last study. FINDINGS  Left Ventricle: Left ventricular ejection fraction, by estimation, is 55 to 60%. The left ventricle has normal function. The left ventricle has no regional wall motion abnormalities. The left ventricular internal cavity size was normal in size. There is  mild concentric left ventricular hypertrophy. Left ventricular diastolic parameters are indeterminate. Right Ventricle: The right ventricular size is severely enlarged. No increase in right ventricular wall thickness. Right ventricular systolic function is mildly reduced. Left Atrium: Left atrial size was severely dilated. Right Atrium: Right atrial size was severely dilated. Pericardium: There is no evidence of pericardial effusion. Mitral Valve: The mitral valve is degenerative in appearance. Severe mitral annular calcification. Mild to moderate mitral valve regurgitation. Tricuspid Valve: The tricuspid valve is normal in structure. Tricuspid valve regurgitation is moderate. Aortic Valve: Mild to moderate paralvalular leak seen in the PSAX at the 2-3 O'clock position with small additional jet at the 9 o'clock position. DVI 0.43, with prosthetic aortic valve acceleration time < 100 ms. The aortic valve has been repaired/replaced. Aortic valve regurgitation is mild to moderate. Aortic regurgitation PHT measures 480 msec. Aortic valve mean gradient measures 16.8 mmHg. Aortic valve peak gradient measures 33.1 mmHg. Aortic valve area, by VTI measures 1.34 cm. There is a 23 mm Sapien prosthetic, stented (TAVR) valve present in the aortic position. Pulmonic Valve: The pulmonic valve was grossly normal. Pulmonic valve regurgitation is mild. No evidence of pulmonic stenosis. Aorta: Aortic dilatation  noted and the aortic root is normal in size and structure. There is mild dilatation of the ascending aorta, measuring 40 mm. IAS/Shunts: The atrial septum is grossly normal. Additional Comments: A pacer wire is visualized.  LEFT VENTRICLE PLAX 2D LVIDd:         4.30 cm  Diastology LVIDs:         2.60 cm  LV e' medial:    5.44 cm/s LV PW:         1.20 cm  LV E/e' medial:  33.5 LV IVS:        1.10 cm  LV e' lateral:   6.96 cm/s LVOT diam:     2.00 cm  LV E/e' lateral: 26.1 LV SV:         76 LV SV Index:   39 LVOT Area:     3.14 cm  RIGHT VENTRICLE            IVC RV S prime:  9.36 cm/s  IVC diam: 1.30 cm TAPSE (M-mode): 1.6 cm RVSP:           32.2 mmHg LEFT ATRIUM              Index       RIGHT ATRIUM           Index LA diam:        4.80 cm  2.47 cm/m  RA Pressure: 3.00 mmHg LA Vol (A2C):   106.0 ml 54.55 ml/m RA Area:     37.80 cm LA Vol (A4C):   122.0 ml 62.79 ml/m RA Volume:   167.00 ml 85.94 ml/m LA Biplane Vol: 115.0 ml 59.18 ml/m  AORTIC VALVE AV Area (Vmax):    1.27 cm AV Area (Vmean):   1.35 cm AV Area (VTI):     1.34 cm AV Vmax:           287.75 cm/s AV Vmean:          187.500 cm/s AV VTI:            0.572 m AV Peak Grad:      33.1 mmHg AV Mean Grad:      16.8 mmHg LVOT Vmax:         116.00 cm/s LVOT Vmean:        80.700 cm/s LVOT VTI:          0.243 m LVOT/AV VTI ratio: 0.43 AI PHT:            480 msec  AORTA Ao Root diam: 3.20 cm Ao Asc diam:  4.00 cm MV E velocity: 182.00 cm/s  TRICUSPID VALVE MV A velocity: 84.80 cm/s   TR Peak grad:   29.2 mmHg MV E/A ratio:  2.15         TR Vmax:        270.00 cm/s                             Estimated RAP:  3.00 mmHg                             RVSP:           32.2 mmHg                              SHUNTS                             Systemic VTI:  0.24 m                             Systemic Diam: 2.00 cm Rudean Haskell MD Electronically signed by Rudean Haskell MD Signature Date/Time: 11/07/2020/4:04:10 PM    Final    CT Angio Chest/Abd/Pel for  Dissection W and/or W/WO  Result Date: 11/13/2020 CLINICAL DATA:  Aortic dissection suspected, right flank pain for 4 days, recent UTI, history of dementia EXAM: CT ANGIOGRAPHY CHEST, ABDOMEN AND PELVIS TECHNIQUE: Non-contrast CT of the chest was initially obtained. Multidetector CT imaging through the chest, abdomen and pelvis was performed using the standard protocol during bolus administration of intravenous contrast. Multiplanar reconstructed images and MIPs were obtained and reviewed to evaluate the vascular anatomy. CONTRAST:  12mL OMNIPAQUE IOHEXOL 350 MG/ML SOLN  COMPARISON:  Radiograph 11/13/2020, CT 05/16/2018 thyroid ultrasound 12/22/2012 FINDINGS: CTA CHEST FINDINGS Cardiovascular: Noncontrast CT images of the chest were obtained initially. Images depict a normal caliber thoracic aorta mild dilatation of the ascending thoracic aorta to 4 cm, not significantly changed from comparison imaging in 2019. Atherosclerotic plaque within the normal caliber aorta. No hyperdense mural thickening or plaque displacement to suggest intramural hematoma. Postcontrast administration there is satisfactory opacification of the arterial vasculature. No acute luminal abnormality of the imaged aorta. No periaortic stranding or hemorrhage. Normal 3 vessel branching of the aortic arch. Proximal great vessels mildly calcified and tortuous but otherwise unremarkable. No significant flow-limiting stenosis or occlusion is seen. Central pulmonary arteries are borderline enlarged. No large central, lobar or proximal segmental filling defects are seen within limitations of this non tailored examination of the pulmonary arteries. Redemonstration of cardiomegaly with predominantly right atrioventricular enlargement. A left chest wall battery pack is in position with pacer leads terminating at the right atrium and cardiac apex. There is dense calcification of mitral annulus. Prior transcatheter aortic valve replacement is noted, in  expected positioning. Extensive coronary artery calcifications are seen. No pericardial effusion. Slight reflux of contrast into the hepatic veins and IVC. No major venous abnormalities within the limitations of this arterial phase exam. Mediastinum/Nodes: No mediastinal fluid or gas. Stable appearance of a 2 cm hypoattenuating the partially calcified right posterior thyroid nodule. Thoracic inlet is otherwise unremarkable. No acute abnormality of the trachea or esophagus. No worrisome mediastinal, hilar or axillary adenopathy. Lungs/Pleura: Chronic region of of focal subpleural scarring and architectural distortion in the right upper lobe. More mild biapical pleuroparenchymal scarring is present as well. Some dependent atelectasis is seen posteriorly. Stable calcified perifissural nodule measuring 2 mm in the right middle lobe. No known new or concerning pulmonary nodules or masses are seen. Musculoskeletal: The osseous structures appear diffusely demineralized which may limit detection of small or nondisplaced fractures. No acute osseous abnormality or suspicious osseous lesion. Levocurvature of the lower thoracic spine, apex T8-9 with exaggerated thoracic kyphosis is similar to comparison exam. Additional degenerative changes in the shoulders and sternoclavicular joints. No worrisome chest wall masses or lesions. Left chest wall battery pack, as above. Mild bilateral gynecomastia. Review of the MIP images confirms the above findings. CTA ABDOMEN AND PELVIS FINDINGS VASCULAR Aorta: Calcified and noncalcified atheromatous plaque throughout the abdominal aorta. No flow-limiting stenosis or occlusion is seen. No acute luminal abnormality. No periaortic stranding or hemorrhage. Celiac: Widely patent ostium, normal opacification. Minimal plaque in the proximal branches, most pronounced in the splenic artery, without significant flow-limiting stenosis or occlusion. No aneurysm or ectasia. No acute luminal abnormality.  SMA: Mild ostial plaque narrowing. Otherwise patent without evidence of aneurysm, dissection, vasculitis or other significant stenosis. Renals: Single renal arteries bilaterally. Mild bilateral ostial plaque narrowing, left slightly greater than right. Vessels are otherwise normally opacified without acute luminal abnormality, features of vasculitis or fibromuscular dysplasia. IMA: Mild-to-moderate ostial plaque narrowing. Otherwise normally opacified. No evidence of aneurysm, dissection or vasculitis. Inflow: Calcification of the inflow vasculature predominantly within the common and internal iliac arteries. Mild ectasia of the internal iliac just proximal to the bifurcation of the anterior and posterior divisions. No other acute vascular abnormality is seen in the inflow vessels. No acute luminal abnormality. No high-grade stenosis or occlusions. Limited visualization of the proximal outflow with calcified plaque in the bilateral common and superficial femoral arteries. Widely patent origins of the bilateral profundus femora S arteries. Veins: Reflux of contrast into  the hepatic veins and IVC. No other major venous abnormality within limitations of this arterial phase examination. Review of the MIP images confirms the above findings. NON-VASCULAR Hepatobiliary: No worrisome focal liver lesions. Smooth liver surface contour. Normal hepatic attenuation. Gallbladder contains a dependently layering calcified 11 mm gallstone. There is some additional hyperdense thickening towards the gallbladder fundus which could reflect inflammatory wall thickening versus adenomyomatosis. No other pericholecystic fluid or inflammation is seen. No biliary ductal dilatation or visible intraductal gallstones. Small air-filled duodenal diverticula near the ampulla. Pancreas: No pancreatic ductal dilatation or surrounding inflammatory changes. Spleen: Mild heterogeneity of the splenic enhancement is a typical finding in the arterial phase  of imaging. Normal in size. No concerning splenic lesions. Adrenals/Urinary Tract: Kidneys enhance symmetrically and uniformly. Fluid attenuation cyst is seen in the interpolar left kidney measuring up to 3.2 cm and lower pole right kidney measuring up to 4.1 cm. No concerning internal complexity. No other concerning renal mass. No visible urolithiasis or hydronephrosis. Bladder decompressed by inflated Foley catheter. Mild bladder wall thickening perivesicular hazy stranding is nonspecific with few punctate foci of gas likely related to instrumentation. Stomach/Bowel: Distal esophagus, stomach and duodenal sweep are unremarkable. No small bowel wall thickening or dilatation. No evidence of obstruction. Normal air-filled appendix in the right lower quadrant. No colonic dilatation or wall thickening. Lymphatic: No suspicious or enlarged lymph nodes in the included lymphatic chains. Reproductive: Mild prostatomegaly. Seminal vesicles are unremarkable. Other: No abdominopelvic free fluid or free gas. No bowel containing hernias. Mild body wall edema. Some focal skin thickening noted adjacent the greater trochanters, correlate with visual inspection. Musculoskeletal: The osseous structures appear diffusely demineralized which may limit detection of small or nondisplaced fractures. No acute osseous abnormality or suspicious osseous lesion. Multilevel degenerative changes are present in the imaged portions of the spine. Features most pronounced L4, L5 to some mild retrolisthesis L5 on S1 and resulting moderate bilateral foraminal narrowing. Additional degenerative changes in the hips and pelvis. Review of the MIP images confirms the above findings. IMPRESSION: Vascular 1. No evidence of acute aortic syndrome. 2. Stable dilatation of the ascending thoracic aorta to 4 cm. Recommend annual imaging followup by CTA or MRA. This recommendation follows 2010 ACCF/AHA/AATS/ACR/ASA/SCA/SCAI/SIR/STS/SVM Guidelines for the Diagnosis  and Management of Patients with Thoracic Aortic Disease. Circulation. 2010; 121: [id number]. Aortic aneurysm NOS (ICD10-I71.9) 3. No evidence of pulmonary embolism. 4. Cardiomegaly with right atrioventricular enlargement. Reflux of contrast into the hepatic veins and IVC, suggestive of right heart dysfunction. Central pulmonary artery enlargement may reflect further elevation of the right sided pressures or chronic pulmonary artery hypertension. 5.  Aortic Atherosclerosis (ICD10-I70.0). 6. Mild to moderate ostial narrowing of the inferior mesenteric artery. Mild ostial narrowing of the bilateral renal arteries and SMA. 7. Mild ectasia of the left internal iliac, unchanged from prior Nonvascular 1. Foley catheter in place. Mild bladder wall thickening with perivesicular hazy stranding, correlate with urinalysis to exclude cystitis. No obstructive urolithiasis or hydronephrosis is seen. 2. Cholelithiasis with mild gallbladder wall thickening versus adenomyomatosis at the gallbladder fundus. Correlate with right upper quadrant symptoms and consider further evaluation with right upper quadrant ultrasound. 3. Stable appearance of a 2 cm hypoattenuating the partially calcified right posterior thyroid nodule. This has been evaluated on previous imaging. (ref: J Am Coll Radiol. 2015 Feb;12(2): 143-50). 4. Stable regions of subpleural scarring and architectural distortion in the right upper lobe. Electronically Signed   By: Lovena Le M.D.   On: 11/13/2020 22:49   US Abdomen  Limited RUQ (LIVER/GB)  Result Date: 11/14/2020 CLINICAL DATA:  Pain EXAM: ULTRASOUND ABDOMEN LIMITED RIGHT UPPER QUADRANT COMPARISON:  11/13/2020 FINDINGS: Gallbladder: The sonographic Percell Miller sign is negative. There are gallstones without significant gallbladder wall thickening or pericholecystic free fluid. Common bile duct: Diameter: 5 mm Liver: No focal lesion identified. Within normal limits in parenchymal echogenicity. Portal vein is patent on  color Doppler imaging with normal direction of blood flow towards the liver. Other: None. IMPRESSION: There is cholelithiasis without secondary signs of acute cholecystitis. Electronically Signed   By: Constance Holster M.D.   On: 11/14/2020 00:08    Assessment/Plan Chronic indwelling Foley catheter Will Restart on Flomax 0.4 mg QHS Voiding Trial in 1 week Late onset Alzheimer's disease with behavioral disturbance ([place]) Will try to taper Seroquel  Change Seroquel to 75 mg QHS He was also taken off the Namenda and Aricept  Other issues Dysphagia, unspecified type On Dysphagic Nectar thick  diet Persistent atrial fibrillation ([facility]) Off Eliquis due to Hematuria  Adult failure to thrive On Remeron. Supportive care Family wants Comfort care Is enrolled in Therapy Hypercholesteremia Off statin   Family/ staff Communication:   Labs/tests ordered:

## 2020-12-04 ENCOUNTER — Telehealth: Payer: Self-pay | Admitting: Internal Medicine

## 2020-12-04 NOTE — Telephone Encounter (Signed)
Patient was admitted to [facility] mid February. Facility was calling to determine what they need to do to ensure his remote transmissions get sent in. Please call [phone] and ask for [place] to assist

## 2020-12-04 NOTE — Telephone Encounter (Signed)
Spoke to [place], states she has the remote box and it is set up beside the patients bed. Advised to call back with further questions or concerns.

## 2020-12-10 ENCOUNTER — Ambulatory Visit (INDEPENDENT_AMBULATORY_CARE_PROVIDER_SITE_OTHER): Payer: Medicare Other

## 2020-12-10 DIAGNOSIS — R001 Bradycardia, unspecified: Secondary | ICD-10-CM | POA: Diagnosis not present

## 2020-12-12 LAB — CUP PACEART REMOTE DEVICE CHECK
Battery Remaining Longevity: 132 mo
Battery Remaining Percentage: 100 %
Brady Statistic RA Percent Paced: 0 %
Brady Statistic RV Percent Paced: 100 %
Date Time Interrogation Session: 20220308104000
Implantable Lead Implant Date: 20170510
Implantable Lead Implant Date: 20170510
Implantable Lead Location: 753859
Implantable Lead Location: 753860
Implantable Lead Model: 7741
Implantable Lead Model: 7742
Implantable Lead Serial Number: 729262
Implantable Lead Serial Number: 766721
Implantable Pulse Generator Implant Date: 20170510
Lead Channel Impedance Value: 641 Ohm
Lead Channel Impedance Value: 736 Ohm
Lead Channel Pacing Threshold Amplitude: 1 V
Lead Channel Pacing Threshold Pulse Width: 0.4 ms
Lead Channel Setting Pacing Amplitude: 1.5 V
Lead Channel Setting Pacing Pulse Width: 0.4 ms
Lead Channel Setting Sensing Sensitivity: 2.5 mV
Pulse Gen Serial Number: 748273

## 2020-12-17 ENCOUNTER — Non-Acute Institutional Stay (SKILLED_NURSING_FACILITY): Payer: Medicare Other | Admitting: Nurse Practitioner

## 2020-12-17 ENCOUNTER — Encounter: Payer: Self-pay | Admitting: Nurse Practitioner

## 2020-12-17 DIAGNOSIS — M48061 Spinal stenosis, lumbar region without neurogenic claudication: Secondary | ICD-10-CM | POA: Diagnosis not present

## 2020-12-17 DIAGNOSIS — R7303 Prediabetes: Secondary | ICD-10-CM | POA: Insufficient documentation

## 2020-12-17 DIAGNOSIS — R627 Adult failure to thrive: Secondary | ICD-10-CM

## 2020-12-17 DIAGNOSIS — Z978 Presence of other specified devices: Secondary | ICD-10-CM | POA: Diagnosis not present

## 2020-12-17 DIAGNOSIS — Z952 Presence of prosthetic heart valve: Secondary | ICD-10-CM | POA: Diagnosis not present

## 2020-12-17 DIAGNOSIS — E7800 Pure hypercholesterolemia, unspecified: Secondary | ICD-10-CM | POA: Diagnosis not present

## 2020-12-17 DIAGNOSIS — I10 Essential (primary) hypertension: Secondary | ICD-10-CM

## 2020-12-17 DIAGNOSIS — R1310 Dysphagia, unspecified: Secondary | ICD-10-CM

## 2020-12-17 DIAGNOSIS — M159 Polyosteoarthritis, unspecified: Secondary | ICD-10-CM | POA: Diagnosis not present

## 2020-12-17 DIAGNOSIS — F01518 Vascular dementia, unspecified severity, with other behavioral disturbance: Secondary | ICD-10-CM

## 2020-12-17 DIAGNOSIS — F0151 Vascular dementia with behavioral disturbance: Secondary | ICD-10-CM

## 2020-12-17 DIAGNOSIS — Z950 Presence of cardiac pacemaker: Secondary | ICD-10-CM

## 2020-12-17 DIAGNOSIS — R269 Unspecified abnormalities of gait and mobility: Secondary | ICD-10-CM | POA: Diagnosis not present

## 2020-12-17 DIAGNOSIS — I4819 Other persistent atrial fibrillation: Secondary | ICD-10-CM | POA: Diagnosis not present

## 2020-12-17 DIAGNOSIS — G250 Essential tremor: Secondary | ICD-10-CM

## 2020-12-17 MED ORDER — TAMSULOSIN HCL 0.4 MG PO CAPS: 0.4000 mg | ORAL_CAPSULE | Freq: Every day | ORAL | 3 refills | Status: DC

## 2020-12-17 MED ORDER — QUETIAPINE FUMARATE 50 MG PO TABS: 75.0000 mg | ORAL_TABLET | Freq: Every day | ORAL | 11 refills | Status: DC

## 2020-12-17 NOTE — Progress Notes (Addendum)
Location:   SNF [place] Room Number: 32 Place of Service:  SNF (31) Provider: Lennie Odor Lucely Leard NP  [name], MD  Patient Care Team: [name], MD as PCP - General (Internal Medicine) Monna Fam, MD as Consulting Physician (Ophthalmology)  Extended Emergency Contact Information Primary Emergency Contact: [name] Address: [address]          [place], [place] [address] Johnnette Litter of [place] Phone: [phone] Relation: Spouse Secondary Emergency Contact: [name] States of [employer] Phone: [phone] Relation: Daughter Interpreter needed? No  Code Status:  DNR Goals of care: Advanced Directive information Advanced Directives 12/17/2020  Does Patient Have a Medical Advance Directive? Yes  Type of Paramedic of [place];Living will  Does patient want to make changes to medical advance directive? -  Copy of [place] in Chart? No - copy requested     Chief Complaint  Patient presents with  . Medical Management of Chronic Issues    HPI:  Pt is a 87 y.o. male seen today for medical management of chronic diseases.      Dysphagia nectar thick per ST             Vascular dementia with behavioral disturbance, underwent neurology eval, takes Alprazolam. Palliative care, may progress to Hospice. No heroic measures. Vit B12 2359 11/22/20. Takes Quetiapine, off Memantine/Donepezil             MRI L4-5 moderate spnal stenosis, no back pain noted while the patient in w/c.              Essential tremor, chronic.              S/p TAVR             Afib, off Eliquis due to hematuria.              Urinary retention,  chronic Foley Catheter, underwent urology evaluation, takes Flomax             Complete heart block s/p pacer             Hyperlipidemia LDL 65             Gait abnormality, w/c for mobility, currently ambulating with walker with therapy.              HTN, Amlodipine.  Bun/creat 19/1.06 11/22/20             FTT advanced dementia, palliative care, takes Mirtazapine 7.5mg  qd. Weight has been stable.              OA, on prn Oxycodone, Tylenol   Prediabetes, Hgb a1c 5.8 11/22/20  Past Medical History:  Diagnosis Date  . Arthritis    "right arm/shoulder" (02/10/2016)  . Complete heart block ([place]) 02/10/2016   a. s/p Emergency planning/management officer  . Goiter   . Hypercholesteremia   . Memory loss   . Nasal septal deviation   . Persistent atrial fibrillation ([place])   . Presence of permanent cardiac pacemaker   . 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 Severe aortic stenosis   . Tremor    Past Surgical History:  Procedure Laterality Date  . CATARACT EXTRACTION W/ INTRAOCULAR LENS  IMPLANT, BILATERAL Bilateral ~ 2000  . EP IMPLANTABLE DEVICE N/A 02/11/2016   Procedure: Pacemaker Implant;  Surgeon: Valli Lance, MD;  Location: [facility];  Service: Cardiovascular;  Laterality: N/A;  . RIGHT/LEFT HEART CATH AND CORONARY ANGIOGRAPHY N/A 05/03/2018   Procedure: RIGHT/LEFT HEART CATH AND CORONARY ANGIOGRAPHY;  Surgeon: Burnell Blanks, MD;  Location: [facility];  Service: Cardiovascular;  Laterality: N/A;  . TEE WITHOUT CARDIOVERSION N/A 05/30/2018   Procedure: TRANSESOPHAGEAL ECHOCARDIOGRAM (TEE);  Surgeon: Burnell Blanks, MD;  Location: [facility];  Service: Open Heart Surgery;  Laterality: N/A;  . TONSILLECTOMY  1930s  . TRANSCATHETER AORTIC VALVE REPLACEMENT, TRANSFEMORAL N/A 05/30/2018   Procedure: TRANSCATHETER AORTIC VALVE REPLACEMENT, TRANSFEMORAL using a 69mm Edwards Sapien 3 Aortic Valve;  Surgeon: Burnell Blanks, MD;  Location: [facility];  Service: Open Heart Surgery;  Laterality: N/A;  . WISDOM TOOTH EXTRACTION  1990s    No Known Allergies  Allergies as of 12/17/2020   No Known Allergies      Medication List       Accurate as of December 17, 2020 11:59 PM. If you have any questions, ask your nurse or doctor.        acetaminophen 500 MG tablet Commonly known as: TYLENOL Take 500-1,000 mg by mouth every 6 (six) hours as needed for mild pain, fever or headache.   acetaminophen 500 MG tablet Commonly known as: TYLENOL Take 1,000 mg by mouth daily.   ALPRAZolam 0.25 MG tablet Commonly known as: XANAX Take 0.25 mg by mouth 3 (three) times daily as needed for anxiety.   amLODipine 5 MG tablet Commonly known as: NORVASC Take 1 tablet by mouth daily.   ipratropium 0.03 % nasal spray Commonly known as: ATROVENT Place 2 sprays into both nostrils 4 (four) times daily as needed for rhinitis.   mirtazapine 7.5 MG tablet Commonly known as: REMERON Take 7.5 mg by mouth daily.   oxyCODONE-acetaminophen 5-325 MG tablet Commonly known as: Percocet Take 1-2 tablets by mouth every 6 (six) hours as needed. What changed: reasons to take this   polyvinyl alcohol 1.4 % ophthalmic solution Commonly known as: LIQUIFILM TEARS Place 1 drop into both eyes as needed for dry eyes.   QUEtiapine 50 MG tablet Commonly known as: SEROQUEL Take 1.5 tablets (75 mg total) by mouth at bedtime. What changed: how much to take Changed by: Reilly Molchan X Raif Chachere, NP   tamsulosin 0.4 MG Caps capsule Commonly known as: FLOMAX Take 1 capsule (0.4 mg total) by mouth daily. Started by: Mickell Birdwell X Peder Allums, NP       Review of Systems  Constitutional: Negative for fatigue, fever and unexpected weight change.  HENT: Positive for hearing loss and trouble swallowing. Negative for congestion and voice change.        Nectar thick liquids.   Respiratory: Negative for cough and shortness of breath.   Cardiovascular: Negative for leg swelling.  Gastrointestinal: Negative for abdominal pain and constipation.  Genitourinary: Positive for difficulty urinating.       Foley  Musculoskeletal: Positive for arthralgias, back pain  and gait problem.  Skin: Negative for color change.  Neurological: Positive for tremors and speech difficulty. Negative for dizziness and weakness.       Dementia, expressive aphasia.   Psychiatric/Behavioral: Positive for confusion. Negative for behavioral problems and sleep disturbance. The patient is not nervous/anxious.     Immunization History  Administered Date(s) Administered  . Influenza, High Dose Seasonal PF 08/01/2014, 06/16/2015, 06/17/2016  . Influenza, Quadrivalent, Recombinant, Inj, Pf 07/26/2017, 07/21/2018, 06/08/2019, 07/02/2020  . Influenza-Unspecified 07/20/2013  .  Pneumococcal Conjugate-13 01/16/2013  . Pneumococcal Polysaccharide-23 01/07/1999  . Td 11/13/2018  . Tdap 01/11/2012  . Zoster 09/07/2006  . Zoster Recombinat (Shingrix) 02/27/2019, 02/27/2019   Pertinent  Health Maintenance Due  Topic Date Due  . INFLUENZA VACCINE  Completed  . PNA vac Low Risk Adult  Completed   Fall Risk  03/06/2019  Falls in the past year? 1  Number falls in past yr: 1  Injury with Fall? 0   Functional Status Survey:    Vitals:   12/17/20 1455  BP: 128/70  Pulse: 61  Resp: 18  Temp: (!) 97.3 F (36.3 C)  SpO2: 91%   There is no height or weight on file to calculate BMI. Physical Exam Vitals and nursing note reviewed.  Constitutional:      Appearance: Normal appearance.  HENT:     Head: Normocephalic and atraumatic.     Nose: Nose normal.     Mouth/Throat:     Mouth: Mucous membranes are moist.  Eyes:     Extraocular Movements: Extraocular movements intact.     Conjunctiva/sclera: Conjunctivae normal.     Pupils: Pupils are equal, round, and reactive to light.  Cardiovascular:     Rate and Rhythm: Normal rate. Rhythm irregular.     Heart sounds: No murmur heard.   Pulmonary:     Effort: Pulmonary effort is normal.     Breath sounds: No rales.  Abdominal:     General: Bowel sounds are normal.     Palpations: Abdomen is soft.     Tenderness: There is no  abdominal tenderness.  Musculoskeletal:     Cervical back: Normal range of motion and neck supple.     Right lower leg: No edema.     Left lower leg: No edema.  Skin:    General: Skin is warm and dry.     Comments: Skin tear left hand palm, no active bleeding or s/s of infection.   Neurological:     General: No focal deficit present.     Mental Status: He is alert. Mental status is at baseline.     Motor: No weakness.     Coordination: Coordination abnormal.     Gait: Gait abnormal.     Comments: Oriented to self. Followed simple directions during my examination. Ambulates with walker with therapy, otherwise w/c for mobility.   Psychiatric:        Mood and Affect: Mood normal.        Behavior: Behavior normal.     Labs reviewed: Recent Labs    11/20/20 1123 11/21/20 1034 11/22/20 0514  NA 135 134* 136  K 4.7 4.3 4.5  CL 98 100 103  CO2 18* 23 24  GLUCOSE 83 71 84  BUN 23 24* 19  CREATININE 1.26 1.24 1.06  CALCIUM 9.5 9.3 8.8*   Recent Labs    11/20/20 1123 11/21/20 1034 11/22/20 0514  AST 52* 60* 55*  ALT 40 48* 40  ALKPHOS 133* 105 83  BILITOT 0.6 0.9 0.9  PROT 6.9 7.5 6.2*  ALBUMIN 4.3 4.2 3.4*   Recent Labs    11/20/20 1123 11/21/20 1034 11/22/20 0514  WBC 9.3 8.0 7.7  NEUTROABS 7.0 6.1 5.6  HGB 14.9 15.7 13.3  HCT 43.5 48.4 40.7  MCV 92 95.3 95.3  PLT 168 151 140*   Lab Results  Component Value Date   TSH 2.277 11/21/2020   Lab Results  Component Value Date   HGBA1C 5.8 (H) 11/22/2020  Lab Results  Component Value Date   CHOL 127 11/22/2020   HDL 52 11/22/2020   LDLCALC 65 11/22/2020   TRIG 50 11/22/2020   CHOLHDL 2.4 11/22/2020    Significant Diagnostic Results in last 30 days:  CUP PACEART REMOTE DEVICE CHECK  Result Date: 12/12/2020 Scheduled remote reviewed. Normal device function.  1 VHR event logged 12 seconds w/ rate 170's bpm; no EGM available for review. Next remote 91 days. HB   Assessment/Plan  Prediabetes Hgb a1c  5.8 11/22/20, diet control.    Osteoarthritis, generalized  on prn Oxycodone, Tylenol   Adult failure to thrive FTT advanced dementia, palliative care, takes Mirtazapine 7.5mg  qd. Weight has been stable.    HTN (hypertension) Blood pressure is controlled, continue Amlodipine. Bun/creat 19/1.06 11/22/20   Gait abnormality Gait abnormality, w/c for mobility, currently ambulating with walker with therapy.    Hypercholesteremia LDL 65  Presence of permanent cardiac pacemaker Complete heart block s/p pacer   Chronic indwelling Foley catheter Urinary retention,  chronic Foley Catheter, underwent urology evaluation, takes Flomax. Failed voiding trial.    Persistent atrial fibrillation (HCC) Off Eliquis due to hematuria.   S/P TAVR (transcatheter aortic valve replacement) 2019  Essential tremor Chronic, able to feed self.  Lumbar spinal stenosis MRI L4-5 moderate spnal stenosis, no back pain noted while the patient in w/c.   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 Palliative care, may progress to Hospice. No heroic measures. Vit B12 2359 11/22/20. Takes Quetiapine, off Memantine/Donepezil   Dysphagia Current taking nectar thick liquids.    Family/ staff Communication: plan of care reviewed with the patient and charge nurse.   Labs/tests ordered: none  Time spend 35 minutes.

## 2020-12-17 NOTE — Assessment & Plan Note (Signed)
on prn Oxycodone, Tylenol

## 2020-12-17 NOTE — Assessment & Plan Note (Signed)
2019 

## 2020-12-17 NOTE — Assessment & Plan Note (Signed)
Current taking nectar thick liquids.

## 2020-12-17 NOTE — Assessment & Plan Note (Signed)
Off Eliquis due to hematuria.

## 2020-12-17 NOTE — Assessment & Plan Note (Signed)
Complete heart block s/p pacer  

## 2020-12-17 NOTE — Assessment & Plan Note (Signed)
Blood pressure is controlled, continue Amlodipine. Bun/creat 19/1.06 11/22/20

## 2020-12-17 NOTE — Assessment & Plan Note (Signed)
LDL65 °

## 2020-12-17 NOTE — Assessment & Plan Note (Signed)
Hgb a1c 5.8 11/22/20, diet control.

## 2020-12-17 NOTE — Assessment & Plan Note (Signed)
MRI L4-5 moderate spnal stenosis, no back pain noted while the patient in w/c.

## 2020-12-17 NOTE — Assessment & Plan Note (Signed)
Gait abnormality, w/c for mobility, currently ambulating with walker with therapy.

## 2020-12-17 NOTE — Assessment & Plan Note (Signed)
Vascular dementia with behavioral disturbance, underwent neurology eval, takes Alprazolam. Palliative care, may progress to Hospice. No heroic measures. Vit B12 2359 11/22/20. Takes Quetiapine, off Memantine/Donepezil

## 2020-12-17 NOTE — Assessment & Plan Note (Signed)
Chronic, able to feed self.

## 2020-12-17 NOTE — Assessment & Plan Note (Signed)
FTT advanced dementia, palliative care, takes Mirtazapine 7.5mg  qd. Weight has been stable.

## 2020-12-17 NOTE — Assessment & Plan Note (Signed)
Urinary retention,  chronic Foley Catheter, underwent urology evaluation, takes Flomax. Failed voiding trial.

## 2020-12-18 NOTE — Progress Notes (Signed)
Remote pacemaker transmission.   

## 2020-12-22 ENCOUNTER — Encounter: Payer: Self-pay | Admitting: Nurse Practitioner

## 2020-12-22 ENCOUNTER — Non-Acute Institutional Stay (SKILLED_NURSING_FACILITY): Payer: Medicare Other | Admitting: Nurse Practitioner

## 2020-12-22 DIAGNOSIS — I350 Nonrheumatic aortic (valve) stenosis: Secondary | ICD-10-CM

## 2020-12-22 DIAGNOSIS — G309 Alzheimer's disease, unspecified: Secondary | ICD-10-CM | POA: Diagnosis not present

## 2020-12-22 DIAGNOSIS — R2681 Unsteadiness on feet: Secondary | ICD-10-CM | POA: Diagnosis not present

## 2020-12-22 DIAGNOSIS — Z978 Presence of other specified devices: Secondary | ICD-10-CM | POA: Diagnosis not present

## 2020-12-22 DIAGNOSIS — R627 Adult failure to thrive: Secondary | ICD-10-CM

## 2020-12-22 DIAGNOSIS — M159 Polyosteoarthritis, unspecified: Secondary | ICD-10-CM

## 2020-12-22 DIAGNOSIS — F0391 Unspecified dementia with behavioral disturbance: Secondary | ICD-10-CM | POA: Diagnosis not present

## 2020-12-22 DIAGNOSIS — N390 Urinary tract infection, site not specified: Secondary | ICD-10-CM | POA: Diagnosis not present

## 2020-12-22 DIAGNOSIS — Z950 Presence of cardiac pacemaker: Secondary | ICD-10-CM | POA: Diagnosis not present

## 2020-12-22 DIAGNOSIS — R278 Other lack of coordination: Secondary | ICD-10-CM | POA: Diagnosis not present

## 2020-12-22 DIAGNOSIS — R269 Unspecified abnormalities of gait and mobility: Secondary | ICD-10-CM

## 2020-12-22 DIAGNOSIS — G250 Essential tremor: Secondary | ICD-10-CM | POA: Diagnosis not present

## 2020-12-22 DIAGNOSIS — M6281 Muscle weakness (generalized): Secondary | ICD-10-CM | POA: Diagnosis not present

## 2020-12-22 DIAGNOSIS — R29898 Other symptoms and signs involving the musculoskeletal system: Secondary | ICD-10-CM | POA: Diagnosis not present

## 2020-12-22 DIAGNOSIS — E7800 Pure hypercholesterolemia, unspecified: Secondary | ICD-10-CM

## 2020-12-22 DIAGNOSIS — F0151 Vascular dementia with behavioral disturbance: Secondary | ICD-10-CM

## 2020-12-22 DIAGNOSIS — I4819 Other persistent atrial fibrillation: Secondary | ICD-10-CM

## 2020-12-22 DIAGNOSIS — I10 Essential (primary) hypertension: Secondary | ICD-10-CM | POA: Diagnosis not present

## 2020-12-22 DIAGNOSIS — R1312 Dysphagia, oropharyngeal phase: Secondary | ICD-10-CM | POA: Diagnosis not present

## 2020-12-22 DIAGNOSIS — R7303 Prediabetes: Secondary | ICD-10-CM

## 2020-12-22 DIAGNOSIS — F0394 Unspecified dementia, unspecified severity, with anxiety: Secondary | ICD-10-CM

## 2020-12-22 DIAGNOSIS — R41841 Cognitive communication deficit: Secondary | ICD-10-CM | POA: Diagnosis not present

## 2020-12-22 NOTE — Assessment & Plan Note (Signed)
S/pp TAVR

## 2020-12-22 NOTE — Assessment & Plan Note (Signed)
Complete heart block s/p pacer  

## 2020-12-22 NOTE — Progress Notes (Addendum)
Location:    [facility] Room Number: 32 Place of Service:  SNF (31) Provider: Marlana Latus NP  [name], MD  Patient Care Team: [name], MD as PCP - General (Internal Medicine) Monna Fam, MD as Consulting Physician (Ophthalmology)  Extended Emergency Contact Information Primary Emergency Contact: [name] Address: [address]          [place], [place] [address] Johnnette Litter of [place] Phone: [phone] Relation: Spouse Secondary Emergency Contact: [name] States of [employer] Phone: [phone] Relation: Daughter Interpreter needed? No  Code Status: DNR Goals of care: Advanced Directive information Advanced Directives 12/17/2020  Does Patient Have a Medical Advance Directive? Yes  Type of Paramedic of [place];Living will  Does patient want to make changes to medical advance directive? -  Copy of [place] in Chart? No - copy requested     Chief Complaint  Patient presents with  . Acute Visit    Behavioral issue    HPI:  Pt is a 87 y.o. male seen today for an acute visit for behavioral issues, mostly restless, pacing, impulsive. Alprazolam used often.    Dysphagia nectar thick per ST Vascular dementia with behavioral disturbance, underwent neurology eval, takes Alprazolam. Palliative care, may progress to Hospice. No heroic measures. Vit B12 2359 11/22/20. Takes Quetiapine-decreased since 12/02/20, off Memantine/Donepezil.also takes Mirtazapine 7.5m qd.  MRI L4-5 moderate spnal stenosis, no back pain noted while the patient in w/c.  Essential tremor, chronic.  S/p TAVR Afib, off Eliquis due to hematuria.  Urinary retention, chronic Foley Catheter, underwent urology evaluation, takes Flomax Complete heart block s/p pacer Hyperlipidemia LDL  65 Gait abnormality, w/c for mobility, currently ambulating with walker with therapy.  HTN, Amlodipine. Bun/creat 19/1.06 11/22/20 FTT advanced dementia, palliative care, takes Mirtazapine. Weight has been stable.  OA, on prn Oxycodone, Tylenol             Prediabetes, Hgb a1c 5.8 11/22/20    Past Medical History:  Diagnosis Date  . Arthritis    "right arm/shoulder" (02/10/2016)  . [place] heart [place] 02/10/2016   a. s/p BEmergency planning/management officer . Goiter   . Hypercholesteremia   . Memory loss   . Nasal septal deviation   . Persistent [place] fibrillation ([place]   . Presence of permanent cardiac pacemaker   . 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 Severe aortic stenosis   . Tremor    Past Surgical History:  Procedure Laterality Date  . CATARACT EXTRACTION W/ INTRAOCULAR LENS  IMPLANT, BILATERAL Bilateral ~ 2000  . EP IMPLANTABLE DEVICE N/A 02/11/2016   Procedure: Pacemaker Implant;  Surgeon: GEvans Lance MD;  [facility];  Service: Cardiovascular;  Laterality: N/A;  . RIGHT/LEFT HEART CATH AND CORONARY ANGIOGRAPHY N/A 05/03/2018   Procedure: RIGHT/LEFT HEART CATH AND CORONARY ANGIOGRAPHY;  Surgeon: MBurnell Blanks MD;  [facility];  Service: Cardiovascular;  Laterality: N/A;  . TEE WITHOUT CARDIOVERSION N/A 05/30/2018   Procedure: TRANSESOPHAGEAL ECHOCARDIOGRAM (TEE);  Surgeon: MBurnell Blanks MD;  [facility]  Service: Open Heart Surgery;  Laterality: N/A;  . TONSILLECTOMY  1930s  . TRANSCATHETER AORTIC VALVE REPLACEMENT, TRANSFEMORAL N/A 05/30/2018   Procedure: TRANSCATHETER AORTIC VALVE REPLACEMENT, TRANSFEMORAL using a 249mEdwards Sapien 3 Aortic Valve;  Surgeon: McLauree Chandler  D, MD;  Location: [facility];  Service: Open Heart Surgery;   Laterality: N/A;  . WISDOM TOOTH EXTRACTION  1990s    No Known Allergies  Allergies as of 12/22/2020   No Known Allergies     Medication List       Accurate as of December 22, 2020 11:59 PM. If you have any questions, ask your nurse or doctor.        acetaminophen 500 MG tablet Commonly known as: TYLENOL Take 500-1,000 mg by mouth every 6 (six) hours as needed for mild pain, fever or headache.   acetaminophen 500 MG tablet Commonly known as: TYLENOL Take 1,000 mg by mouth daily.   ALPRAZolam 0.25 MG tablet Commonly known as: XANAX Take 0.25 mg by mouth 3 (three) times daily as needed for anxiety.   amLODipine 5 MG tablet Commonly known as: NORVASC Take 1 tablet by mouth daily.   ipratropium 0.03 % nasal spray Commonly known as: ATROVENT Place 2 sprays into both nostrils 4 (four) times daily as needed for rhinitis.   mirtazapine 7.5 MG tablet Commonly known as: REMERON Take 7.5 mg by mouth daily.   oxyCODONE-acetaminophen 5-325 MG tablet Commonly known as: Percocet Take 1-2 tablets by mouth every 6 (six) hours as needed. What changed: reasons to take this   polyvinyl alcohol 1.4 % ophthalmic solution Commonly known as: LIQUIFILM TEARS Place 1 drop into both eyes as needed for dry eyes.   QUEtiapine 50 MG tablet Commonly known as: SEROQUEL Take 1.5 tablets (75 mg total) by mouth at bedtime.   tamsulosin 0.4 MG Caps capsule Commonly known as: FLOMAX Take 1 capsule (0.4 mg total) by mouth daily.       Review of Systems  Constitutional: Negative for fatigue, fever and unexpected weight change.  HENT: Positive for hearing loss and trouble swallowing. Negative for congestion and voice change.        Nectar thick liquids.   Respiratory: Negative for cough and shortness of breath.   Cardiovascular: Negative for leg swelling.  Gastrointestinal: Negative for abdominal pain and constipation.  Genitourinary: Positive for difficulty urinating.       Foley   Musculoskeletal: Positive for arthralgias, back pain and gait problem.  Skin: Negative for color change.  Neurological: Positive for tremors and speech difficulty. Negative for dizziness and weakness.       Dementia, expressive aphasia.   Psychiatric/Behavioral: Positive for confusion. Negative for behavioral problems, hallucinations and sleep disturbance. The patient is nervous/anxious.        Restlessness, pacing, early am awake.     Immunization History  Administered Date(s) Administered  . Influenza, High Dose Seasonal PF 08/01/2014, 06/16/2015, 06/17/2016  . Influenza, Quadrivalent, Recombinant, Inj, Pf 07/26/2017, 07/21/2018, 06/08/2019, 07/02/2020  . Influenza-Unspecified 07/20/2013  . Pneumococcal Conjugate-13 01/16/2013  . Pneumococcal Polysaccharide-23 01/07/1999  . Td 11/13/2018  . Tdap 01/11/2012  . Zoster 09/07/2006  . Zoster Recombinat (Shingrix) 02/27/2019, 02/27/2019   Pertinent  Health Maintenance Due  Topic Date Due  . INFLUENZA VACCINE  Completed  . PNA vac Low Risk Adult  Completed   Fall Risk  03/06/2019  Falls in the past year? 1  Number falls in past yr: 1  Injury with Fall? 0   Functional Status Survey:    Vitals:   12/22/20 1519  BP: 128/70  Pulse: 61  Resp: 18  Temp: 98 F (36.7 C)  SpO2: 95%  Weight: 156 lb 4.8 oz (70.9 kg)  Height: 5' 10"  (1.778 m)  Body mass index is 22.43 kg/m. Physical Exam Vitals and nursing note reviewed.  Constitutional:      Appearance: Normal appearance.  HENT:     Head: Normocephalic and atraumatic.     Nose: Nose normal.     Mouth/Throat:     Mouth: Mucous membranes are moist.  Eyes:     Extraocular Movements: Extraocular movements intact.     Conjunctiva/sclera: Conjunctivae normal.     Pupils: Pupils are equal, round, and reactive to light.  Cardiovascular:     Rate and Rhythm: Normal rate. Rhythm irregular.     Heart sounds: No murmur heard.   Pulmonary:     Effort: Pulmonary effort is normal.      Breath sounds: No rales.  Abdominal:     General: Bowel sounds are normal.     Palpations: Abdomen is soft.     Tenderness: There is no abdominal tenderness.  Genitourinary:    Comments: Foley Musculoskeletal:     Cervical back: Normal range of motion and neck supple.     Right lower leg: No edema.     Left lower leg: No edema.  Skin:    General: Skin is warm and dry.     Comments: Skin tear left hand palm, no active bleeding or s/s of infection.   Neurological:     General: No focal deficit present.     Mental Status: He is alert. Mental status is at baseline.     Motor: No weakness.     Coordination: Coordination abnormal.     Gait: Gait abnormal.     Comments: Oriented to self. Followed simple directions during my examination. Ambulates with walker with therapy, otherwise w/c for mobility.   Psychiatric:        Mood and Affect: Mood normal.        Behavior: Behavior normal.     Labs reviewed: Recent Labs    11/20/20 1123 11/21/20 1034 11/22/20 0514  NA 135 134* 136  K 4.7 4.3 4.5  CL 98 100 103  CO2 18* 23 24  GLUCOSE 83 71 84  BUN 23 24* 19  CREATININE 1.26 1.24 1.06  CALCIUM 9.5 9.3 8.8*   Recent Labs    11/20/20 1123 11/21/20 1034 11/22/20 0514  AST 52* 60* 55*  ALT 40 48* 40  ALKPHOS 133* 105 83  BILITOT 0.6 0.9 0.9  PROT 6.9 7.5 6.2*  ALBUMIN 4.3 4.2 3.4*   Recent Labs    11/20/20 1123 11/21/20 1034 11/22/20 0514  WBC 9.3 8.0 7.7  NEUTROABS 7.0 6.1 5.6  HGB 14.9 15.7 13.3  HCT 43.5 48.4 40.7  MCV 92 95.3 95.3  PLT 168 151 140*   Lab Results  Component Value Date   TSH 2.277 11/21/2020   Lab Results  Component Value Date   HGBA1C 5.8 (H) 11/22/2020   Lab Results  Component Value Date   CHOL 127 11/22/2020   HDL 52 11/22/2020   LDLCALC 65 11/22/2020   TRIG 50 11/22/2020   CHOLHDL 2.4 11/22/2020    Significant Diagnostic Results in last 30 days:  CUP PACEART REMOTE DEVICE CHECK  Result Date: 12/12/2020 Scheduled remote  reviewed. Normal device function.  1 VHR event logged 12 seconds w/ rate 170's bpm; no EGM available for review. Next remote 91 days. 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 Palliative care, may progress to Hospice. No heroic measures. Vit B12 2359  11/22/20. Takes Quetiapine-decreased since 12/02/20, off Memantine/Donepezil.also takes Mirtazapine 7.23m qd.   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   Osteoarthritis, generalized MRI L4-5 moderate spnal stenosis, no back pain noted while the patient in w/c. on prn Oxycodone, Tylenol    Essential tremor Chronic, still able to feed self.   Severe aortic stenosis S/pp TAVR  Persistent atrial fibrillation (HCC) Off Eliquis due to hematuria.   Chronic indwelling Foley catheter  chronic Foley Catheter, underwent urology evaluation, takes Flomax  Presence of permanent cardiac pacemaker Complete heart block s/p pacer   Hypercholesteremia LDL 65  Gait abnormality w/c for mobility, currently ambulating with walker with therapy.   HTN (hypertension) Amlodipine. Bun/creat 19/1.06 11/22/20   Adult failure to thrive advanced dementia, palliative care, takes Mirtazapine. Weight has been stable.    Prediabetes Prediabetes, Hgb a1c 5.8 11/22/20     Family/ staff Communication: plan of care reviewed with the patient and charge nurse.   Labs/tests ordered: CBC/diff, CMP/eGFR  Time spend 35 minutes.

## 2020-12-22 NOTE — Assessment & Plan Note (Addendum)
MRI L4-5 moderate spnal stenosis, no back pain noted while the patient in w/c. on prn Oxycodone, Tylenol

## 2020-12-22 NOTE — Assessment & Plan Note (Signed)
w/c for mobility, currently ambulating with walker with therapy. 

## 2020-12-22 NOTE — Assessment & Plan Note (Signed)
Chronic, still able to feed self.

## 2020-12-22 NOTE — Assessment & Plan Note (Signed)
Prediabetes, Hgb a1c 5.8 11/22/20

## 2020-12-22 NOTE — Assessment & Plan Note (Addendum)
Vascular dementia with behavioral disturbance, underwent neurology eval, takes Alprazolam. Palliative care, may progress to Hospice. No heroic measures. Vit B12 2359 11/22/20. Takes Quetiapine-decreased since 12/02/20, off Memantine/Donepezil.also takes Mirtazapine 7.5mg qd.   12/22/20 increase Mirtazapine to 15mg qd, continue prn Alprazolam, may need increase Seroquel if no better, update CBC/diff, CMP/eGFR 

## 2020-12-22 NOTE — Assessment & Plan Note (Signed)
LDL65 °

## 2020-12-22 NOTE — Assessment & Plan Note (Signed)
Off Eliquis due to hematuria.

## 2020-12-22 NOTE — Assessment & Plan Note (Signed)
Continue SN[facility] for supportive care, off Memantine, Donepezil.

## 2020-12-22 NOTE — Assessment & Plan Note (Signed)
chronic Foley Catheter, underwent urology evaluation, takes Flomax 

## 2020-12-22 NOTE — Assessment & Plan Note (Signed)
Amlodipine. Bun/creat 19/1.06 11/22/20

## 2020-12-22 NOTE — Assessment & Plan Note (Signed)
advanced dementia, palliative care, takes Mirtazapine.Weight has been stable.  

## 2020-12-23 ENCOUNTER — Encounter: Payer: Self-pay | Admitting: Nurse Practitioner

## 2020-12-23 DIAGNOSIS — I10 Essential (primary) hypertension: Secondary | ICD-10-CM | POA: Diagnosis not present

## 2020-12-23 LAB — BASIC METABOLIC PANEL
BUN: 23 — AB (ref 4–21)
CO2: 22 (ref 13–22)
Chloride: 105 (ref 99–108)
Creatinine: 1 (ref 0.6–1.3)
Glucose: 94
Potassium: 3.9 (ref 3.4–5.3)
Sodium: 141 (ref 137–147)

## 2020-12-23 LAB — COMPREHENSIVE METABOLIC PANEL
Albumin: 3.5 (ref 3.5–5.0)
Calcium: 8.9 (ref 8.7–10.7)
Globulin: 2.8

## 2020-12-23 LAB — CBC AND DIFFERENTIAL
HCT: 39 — AB (ref 41–53)
Hemoglobin: 12.7 — AB (ref 13.5–17.5)
Neutrophils Absolute: 14116
Platelets: 188 (ref 150–399)
WBC: 16.3

## 2020-12-23 LAB — HEPATIC FUNCTION PANEL
ALT: 39 (ref 10–40)
AST: 72 — AB (ref 14–40)
Alkaline Phosphatase: 101 (ref 25–125)
Bilirubin, Total: 1.2

## 2020-12-23 LAB — CBC: RBC: 4.08 (ref 3.87–5.11)

## 2020-12-24 ENCOUNTER — Non-Acute Institutional Stay (SKILLED_NURSING_FACILITY): Payer: Medicare Other | Admitting: Nurse Practitioner

## 2020-12-24 ENCOUNTER — Encounter: Payer: Self-pay | Admitting: Nurse Practitioner

## 2020-12-24 DIAGNOSIS — E7800 Pure hypercholesterolemia, unspecified: Secondary | ICD-10-CM | POA: Diagnosis not present

## 2020-12-24 DIAGNOSIS — I4819 Other persistent atrial fibrillation: Secondary | ICD-10-CM

## 2020-12-24 DIAGNOSIS — D72829 Elevated white blood cell count, unspecified: Secondary | ICD-10-CM

## 2020-12-24 DIAGNOSIS — R29898 Other symptoms and signs involving the musculoskeletal system: Secondary | ICD-10-CM | POA: Diagnosis not present

## 2020-12-24 DIAGNOSIS — M159 Polyosteoarthritis, unspecified: Secondary | ICD-10-CM | POA: Diagnosis not present

## 2020-12-24 DIAGNOSIS — F0394 Unspecified dementia, unspecified severity, with anxiety: Secondary | ICD-10-CM

## 2020-12-24 DIAGNOSIS — R1312 Dysphagia, oropharyngeal phase: Secondary | ICD-10-CM | POA: Diagnosis not present

## 2020-12-24 DIAGNOSIS — R627 Adult failure to thrive: Secondary | ICD-10-CM

## 2020-12-24 DIAGNOSIS — R1310 Dysphagia, unspecified: Secondary | ICD-10-CM

## 2020-12-24 DIAGNOSIS — Z978 Presence of other specified devices: Secondary | ICD-10-CM | POA: Diagnosis not present

## 2020-12-24 DIAGNOSIS — Z950 Presence of cardiac pacemaker: Secondary | ICD-10-CM | POA: Diagnosis not present

## 2020-12-24 DIAGNOSIS — N390 Urinary tract infection, site not specified: Secondary | ICD-10-CM | POA: Diagnosis not present

## 2020-12-24 DIAGNOSIS — R7303 Prediabetes: Secondary | ICD-10-CM | POA: Diagnosis not present

## 2020-12-24 DIAGNOSIS — F02818 Alzheimer's disease with late onset: Secondary | ICD-10-CM

## 2020-12-24 DIAGNOSIS — I10 Essential (primary) hypertension: Secondary | ICD-10-CM

## 2020-12-24 DIAGNOSIS — G301 Alzheimer's disease with late onset: Secondary | ICD-10-CM

## 2020-12-24 DIAGNOSIS — F0391 Unspecified dementia with behavioral disturbance: Secondary | ICD-10-CM

## 2020-12-24 DIAGNOSIS — G250 Essential tremor: Secondary | ICD-10-CM

## 2020-12-24 DIAGNOSIS — M6281 Muscle weakness (generalized): Secondary | ICD-10-CM | POA: Diagnosis not present

## 2020-12-24 DIAGNOSIS — I350 Nonrheumatic aortic (valve) stenosis: Secondary | ICD-10-CM | POA: Diagnosis not present

## 2020-12-24 DIAGNOSIS — R269 Unspecified abnormalities of gait and mobility: Secondary | ICD-10-CM | POA: Diagnosis not present

## 2020-12-24 DIAGNOSIS — R278 Other lack of coordination: Secondary | ICD-10-CM | POA: Diagnosis not present

## 2020-12-24 DIAGNOSIS — F0281 Dementia in other diseases classified elsewhere with behavioral disturbance: Secondary | ICD-10-CM

## 2020-12-24 DIAGNOSIS — R2681 Unsteadiness on feet: Secondary | ICD-10-CM | POA: Diagnosis not present

## 2020-12-24 DIAGNOSIS — R0602 Shortness of breath: Secondary | ICD-10-CM | POA: Diagnosis not present

## 2020-12-24 NOTE — Assessment & Plan Note (Addendum)
Urinary retention, chronic Foley Catheter, underwent urology evaluation, takes Flomax. Voiding trial again, apply Triamcinolone/Nystatin cram bid to reddened and swelling penis for trauma, irritation, inflammation, and possible yeast infection with while drainage in the crease under penis glans. x1 week. Noted yellow pus  followed at the tip of Foley catheter upon removal. Will obtain UA C/S.

## 2020-12-24 NOTE — Assessment & Plan Note (Signed)
Heart rate is in control, off Eliquis due to hematuria.

## 2020-12-24 NOTE — Assessment & Plan Note (Signed)
Diet controlled, Hgb a1c 5.8 11/22/20

## 2020-12-24 NOTE — Progress Notes (Signed)
Location:   [facility] Room Number: 23 Place of Service:  SNF (31) Provider: Lennie Odor Omir Cooprider NP  [name], MD  Patient Care Team: [name], MD as PCP - General (Internal Medicine) Monna Fam, MD as Consulting Physician (Ophthalmology)  Extended Emergency Contact Information Primary Emergency Contact: [name] Address: [address]          [place], [place] [address] Johnnette [place] of [place] Phone: [phone] Relation: Spouse Secondary Emergency Contact: [name] of [employer] Phone: [phone] Relation: Daughter Interpreter needed? No  Code Status: DNR Goals of care: Advanced Directive information Advanced Directives 12/17/2020  Does Patient Have a Medical Advance Directive? Yes  Type of Paramedic of [place];Living will  Does patient want to make changes to medical advance directive? -  Copy of [place] in Chart? No - copy requested     Chief Complaint  Patient presents with  . Acute Visit    Penis redness, swelling, elevated wbc 16.3 12/23/20    HPI:  Pt is a 87 y.o. male seen today for an acute visit for reported penis redness and swelling, Foley intact, the patient has been pulling on Foley observed. Wbc 16.3 12/23/20. Afebrile.   Dysphagia nectar thick per ST Vascular dementia with behavioral disturbance, underwent neurology eval, takesAlprazolam. Palliative care, may progress to Hospice. No heroic measures. Vit B12 2359 11/22/20. Takes Quetiapine-decreased since 12/02/20, off Memantine/Donepezil.also takes Mirtazapine 15 mg since 12/22/20  Anxiety/depression, seems responding to Mirtazapine 15mg  increased 12/22/20, his presentation is restless, pacing, also prn Alprazolam available to him  MRI L4-5 moderate spnal stenosis, no back pain noted while the patient in w/c.  Essential tremor, chronic.  S/p  TAVR Afib, off Eliquis due to hematuria.  Urinary retention, chronic Foley Catheter, underwent urology evaluation, takes Flomax Complete heart block s/p pacer Hyperlipidemia LDL 65 Gait abnormality, w/c for mobility, currently ambulating with walker with therapy. HTN, Amlodipine. Bun/creat 23/0.95 12/23/20 FTT advanced dementia, palliative care, takes Mirtazapine.Weight has been stable. OA, on prn Oxycodone, Tylenol  Prediabetes, Hgb a1c 5.8 11/22/20   Past Medical History:  Diagnosis Date  . Arthritis    "right arm/shoulder" (02/10/2016)  . Complete heart block ([place]) 02/10/2016   a. s/p Emergency planning/management officer  . Goiter   . Hypercholesteremia   . Memory loss   . Nasal septal deviation   . Persistent atrial fibrillation ([place])   . Presence of permanent cardiac pacemaker   . 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 Severe aortic stenosis   . Tremor    Past Surgical History:  Procedure Laterality Date  . CATARACT EXTRACTION W/ INTRAOCULAR LENS  IMPLANT, BILATERAL Bilateral ~ 2000  . EP IMPLANTABLE DEVICE N/A 02/11/2016   Procedure: Pacemaker Implant;  Surgeon: Wenker Lance, MD;  Location: [facility];  Service: Cardiovascular;  Laterality: N/A;  . RIGHT/LEFT HEART CATH AND CORONARY ANGIOGRAPHY N/A 05/03/2018   Procedure: RIGHT/LEFT HEART CATH AND CORONARY ANGIOGRAPHY;  Surgeon: Burnell Blanks, MD;  Location: [facility];  Service: Cardiovascular;  Laterality: N/A;  . TEE WITHOUT CARDIOVERSION N/A 05/30/2018   Procedure: TRANSESOPHAGEAL ECHOCARDIOGRAM (TEE);  Surgeon: Burnell Blanks, MD;  Location: [facility];  Service: Open Heart Surgery;  Laterality: N/A;  . TONSILLECTOMY  1930s  . TRANSCATHETER AORTIC VALVE REPLACEMENT,  TRANSFEMORAL N/A 05/30/2018  Procedure: TRANSCATHETER AORTIC VALVE REPLACEMENT, TRANSFEMORAL using a 98mm Edwards Sapien 3 Aortic Valve;  Surgeon: Burnell Blanks, MD;  Location: [facility];  Service: Open Heart Surgery;  Laterality: N/A;  . WISDOM TOOTH EXTRACTION  1990s    No Known Allergies  Allergies as of 12/24/2020   No Known Allergies     Medication List       Accurate as of December 24, 2020 11:59 PM. If you have any questions, ask your nurse or doctor.        acetaminophen 500 MG tablet Commonly known as: TYLENOL Take 500-1,000 mg by mouth every 6 (six) hours as needed for mild pain, fever or headache.   acetaminophen 500 MG tablet Commonly known as: TYLENOL Take 1,000 mg by mouth daily.   ALPRAZolam 0.25 MG tablet Commonly known as: XANAX Take 0.25 mg by mouth 3 (three) times daily as needed for anxiety.   amLODipine 5 MG tablet Commonly known as: NORVASC Take 1 tablet by mouth daily.   ipratropium 0.03 % nasal spray Commonly known as: ATROVENT Place 2 sprays into both nostrils 4 (four) times daily as needed for rhinitis.   mirtazapine 7.5 MG tablet Commonly known as: REMERON Take 7.5 mg by mouth daily.   oxyCODONE-acetaminophen 5-325 MG tablet Commonly known as: Percocet Take 1-2 tablets by mouth every 6 (six) hours as needed. What changed: reasons to take this   polyvinyl alcohol 1.4 % ophthalmic solution Commonly known as: LIQUIFILM TEARS Place 1 drop into both eyes as needed for dry eyes.   QUEtiapine 50 MG tablet Commonly known as: SEROQUEL Take 1.5 tablets (75 mg total) by mouth at bedtime.   tamsulosin 0.4 MG Caps capsule Commonly known as: FLOMAX Take 1 capsule (0.4 mg total) by mouth daily.       Review of Systems  Constitutional: Negative for activity change, appetite change and fever.  HENT: Positive for hearing loss and trouble swallowing. Negative for congestion and voice change.        Nectar thick liquids.   Respiratory:  Negative for cough and shortness of breath.   Cardiovascular: Negative for leg swelling.  Gastrointestinal: Negative for abdominal pain and constipation.  Genitourinary: Positive for difficulty urinating, penile pain and penile swelling.       Foley. Penile redness  Musculoskeletal: Positive for arthralgias, back pain and gait problem.  Skin: Negative for color change.  Neurological: Positive for tremors and speech difficulty. Negative for weakness and headaches.       Dementia, expressive aphasia.   Psychiatric/Behavioral: Positive for confusion. Negative for behavioral problems, hallucinations and sleep disturbance. The patient is nervous/anxious.        Restlessness, pacing, early am awake.     Immunization History  Administered Date(s) Administered  . Influenza, High Dose Seasonal PF 08/01/2014, 06/16/2015, 06/17/2016  . Influenza, Quadrivalent, Recombinant, Inj, Pf 07/26/2017, 07/21/2018, 06/08/2019, 07/02/2020  . Influenza-Unspecified 07/20/2013  . Pneumococcal Conjugate-13 01/16/2013  . Pneumococcal Polysaccharide-23 01/07/1999  . Td 11/13/2018  . Tdap 01/11/2012  . Zoster 09/07/2006  . Zoster Recombinat (Shingrix) 02/27/2019, 02/27/2019   Pertinent  Health Maintenance Due  Topic Date Due  . INFLUENZA VACCINE  Completed  . PNA vac Low Risk Adult  Completed   Fall Risk  03/06/2019  Falls in the past year? 1  Number falls in past yr: 1  Injury with Fall? 0   Functional Status Survey:    Vitals:   12/24/20 1107  BP: 116/64  Pulse: 60  Resp: 20  Temp: (!) 96.5 F (35.8 C)  SpO2: 97%   There is no height or weight on file to calculate BMI. Physical Exam Vitals and nursing note reviewed.  Constitutional:      Appearance: Normal appearance.  HENT:     Head: Normocephalic and atraumatic.     Nose: Nose normal.     Mouth/Throat:     Mouth: Mucous membranes are moist.  Eyes:     Extraocular Movements: Extraocular movements intact.     Conjunctiva/sclera:  Conjunctivae normal.     Pupils: Pupils are equal, round, and reactive to light.  Cardiovascular:     Rate and Rhythm: Normal rate. Rhythm irregular.     Heart sounds: Murmur heard.    Pulmonary:     Effort: Pulmonary effort is normal.     Breath sounds: No rales.  Abdominal:     General: Bowel sounds are normal.     Palpations: Abdomen is soft.     Tenderness: There is no abdominal tenderness.  Genitourinary:    Comments: Foley. Penile redness, swelling, no apparent drainage.  Musculoskeletal:     Cervical back: Normal range of motion and neck supple.     Right lower leg: No edema.     Left lower leg: No edema.  Skin:    General: Skin is warm and dry.     Comments: Skin tear left hand palm, no active bleeding or s/s of infection.   Neurological:     General: No focal deficit present.     Mental Status: He is alert. Mental status is at baseline.     Motor: No weakness.     Coordination: Coordination abnormal.     Gait: Gait abnormal.     Comments: Oriented to self. Followed simple directions during my examination. Ambulates with walker with therapy, otherwise w/c for mobility.   Psychiatric:        Mood and Affect: Mood normal.        Behavior: Behavior normal.     Labs reviewed: Recent Labs    11/20/20 1123 11/21/20 1034 11/22/20 0514  NA 135 134* 136  K 4.7 4.3 4.5  CL 98 100 103  CO2 18* 23 24  GLUCOSE 83 71 84  BUN 23 24* 19  CREATININE 1.26 1.24 1.06  CALCIUM 9.5 9.3 8.8*   Recent Labs    11/20/20 1123 11/21/20 1034 11/22/20 0514  AST 52* 60* 55*  ALT 40 48* 40  ALKPHOS 133* 105 83  BILITOT 0.6 0.9 0.9  PROT 6.9 7.5 6.2*  ALBUMIN 4.3 4.2 3.4*   Recent Labs    11/20/20 1123 11/21/20 1034 11/22/20 0514  WBC 9.3 8.0 7.7  NEUTROABS 7.0 6.1 5.6  HGB 14.9 15.7 13.3  HCT 43.5 48.4 40.7  MCV 92 95.3 95.3  PLT 168 151 140*   Lab Results  Component Value Date   TSH 2.277 11/21/2020   Lab Results  Component Value Date   HGBA1C 5.8 (H)  11/22/2020   Lab Results  Component Value Date   CHOL 127 11/22/2020   HDL 52 11/22/2020   LDLCALC 65 11/22/2020   TRIG 50 11/22/2020   CHOLHDL 2.4 11/22/2020    Significant Diagnostic Results in last 30 days:  CUP PACEART REMOTE DEVICE CHECK  Result Date: 12/12/2020 Scheduled remote reviewed. Normal device function.  1 VHR event logged 12 seconds w/ rate 170's bpm; no EGM available for review. Next remote 91 days. HB   Assessment/Plan: Leukocytosis 12/23/20 wbc 16.3,  neutrophils 86.6, Bun/creat 23/0.95 12/24/20 will obtain CXR ap/lateral, UA C/S  Chronic indwelling Foley catheter Urinary retention, chronic Foley Catheter, underwent urology evaluation, takes Flomax. Voiding trial again, apply Triamcinolone/Nystatin cram bid to reddened and swelling penis for trauma, irritation, inflammation, and possible yeast infection with while drainage in the crease under penis glans. x1 week. Noted yellow pus  followed at the tip of Foley catheter upon removal. Will obtain UA C/S.   Presence of permanent cardiac pacemaker Complete heart block  Hypercholesteremia LDL65  Gait abnormality w/c for mobility, currently ambulating with walker with therapy.   HTN (hypertension) Blood pressure is controlled, continue Amlodipine. Bun/creat 23/0.95 12/23/20   Adult failure to thrive FTT advanced dementia, palliative care, takes Mirtazapine.Weight has been stable.  Osteoarthritis, generalized Pain is controlled, continue Tylenol, Oxycodone. MRI L4-5 moderate spnal stenosis, no back pain noted while the patient in w/c.  Prediabetes Diet controlled, Hgb a1c 5.8 11/22/20  Persistent atrial fibrillation (HCC) Heart rate is in control, off Eliquis due to hematuria.   Severe aortic stenosis S/p TAVR  Essential tremor Still able to feed self.   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 Palliative care, may progress to Hospice. No heroic measures. Vit B12 2359 11/22/20. Takes Quetiapine-decreased since 12/02/20, off Memantine/Donepezil.also takes Mirtazapine 15 mg since 12/22/20   Dysphagia Nectar thick since 12/07/20 per ST    Family/ staff Communication:plan of care reviewed with the patient and charge nurse.   Labs/tests ordered:   Time spend 35 minutes.

## 2020-12-24 NOTE — Assessment & Plan Note (Signed)
Complete heart block

## 2020-12-24 NOTE — Assessment & Plan Note (Signed)
FTT advanced dementia, palliative care, takes Mirtazapine.Weight has been stable.

## 2020-12-24 NOTE — Assessment & Plan Note (Signed)
Anxiety/depression, seems responding to Mirtazapine 15mg  increased 12/22/20, his presentation is restless, pacing, also prn Alprazolam available to him

## 2020-12-24 NOTE — Assessment & Plan Note (Signed)
Still able to feed self.

## 2020-12-24 NOTE — Assessment & Plan Note (Signed)
Vascular dementia with behavioral disturbance, underwent neurology eval, takesAlprazolam. Palliative care, may progress to Hospice. No heroic measures. Vit B12 2359 11/22/20. Takes Quetiapine-decreased since 12/02/20, off Memantine/Donepezil.also takes Mirtazapine 15 mg since 12/22/20

## 2020-12-24 NOTE — Assessment & Plan Note (Signed)
w/c for mobility, currently ambulating with walker with therapy.

## 2020-12-24 NOTE — Assessment & Plan Note (Signed)
12/23/20 wbc 16.3, neutrophils 86.6, Bun/creat 23/0.95 12/24/20 will obtain CXR ap/lateral, UA C/S

## 2020-12-24 NOTE — Assessment & Plan Note (Signed)
Nectar thick since 12/07/20 per ST

## 2020-12-24 NOTE — Assessment & Plan Note (Addendum)
Pain is controlled, continue Tylenol, Oxycodone. MRI L4-5 moderate spnal stenosis, no back pain noted while the patient in w/c.

## 2020-12-24 NOTE — Assessment & Plan Note (Signed)
S/p TAVR 

## 2020-12-24 NOTE — Assessment & Plan Note (Signed)
Blood pressure is controlled, continue Amlodipine. Bun/creat 23/0.95 12/23/20

## 2020-12-24 NOTE — Assessment & Plan Note (Signed)
[id number]

## 2020-12-25 ENCOUNTER — Encounter: Payer: Self-pay | Admitting: Nurse Practitioner

## 2020-12-25 NOTE — Progress Notes (Signed)
This encounter was created in error - please disregard.

## 2020-12-26 ENCOUNTER — Non-Acute Institutional Stay (SKILLED_NURSING_FACILITY): Payer: Medicare Other | Admitting: Internal Medicine

## 2020-12-26 ENCOUNTER — Encounter: Payer: Self-pay | Admitting: Nurse Practitioner

## 2020-12-26 DIAGNOSIS — F0151 Vascular dementia with behavioral disturbance: Secondary | ICD-10-CM

## 2020-12-26 DIAGNOSIS — D72829 Elevated white blood cell count, unspecified: Secondary | ICD-10-CM

## 2020-12-26 DIAGNOSIS — Z978 Presence of other specified devices: Secondary | ICD-10-CM | POA: Diagnosis not present

## 2020-12-26 DIAGNOSIS — G309 Alzheimer's disease, unspecified: Secondary | ICD-10-CM

## 2020-12-26 DIAGNOSIS — F01518 Vascular dementia, unspecified severity, with other behavioral disturbance: Secondary | ICD-10-CM

## 2020-12-26 DIAGNOSIS — I10 Essential (primary) hypertension: Secondary | ICD-10-CM

## 2020-12-26 MED ORDER — CLONAZEPAM 0.5 MG PO TABS: 0.5000 mg | ORAL_TABLET | Freq: Two times a day (BID) | ORAL | 1 refills | Status: AC

## 2020-12-26 NOTE — Progress Notes (Signed)
Location: [place] [facility] manager of Service:  SNF (31)  Provider:   Code Status: DNR Goals of Care:  Advanced Directives 12/17/2020  Does Patient Have a Medical Advance Directive? Yes  Type of Paramedic of [place];Living will  Does patient want to make changes to medical advance directive? -  Copy of [place] in Chart? No - copy requested     Chief Complaint  Patient presents with  . Acute Visit    HPI: Patient is a 87 y.o. male seen today for an acute visit for Behavior Issues  SNF for [facility] Patient has a history of A. fib not on any anticoagulation due to hematuria   CHB s/p pacer, HLD, History of bilateral hand tremors. And progressive dementia Chronic Foley catheter s/p TAVR Was admitted in the hospital from2/18-2/23for altered mental status.  Patient continues to have behaviors Issues including not sitting at one place trying to get up. Aggressive towards staff He now has sitter but they cannot keep him in his chair Giving him Xanax PRN Want to know if can prescribe something stronger. He does have Labs Pending and also has Urine Culture pending  Past Medical History:  Diagnosis Date  . Arthritis    "right arm/shoulder" (02/10/2016)  . Complete heart block ([place]) 02/10/2016   a. s/p Emergency planning/management officer  . Goiter   . Hypercholesteremia   . Memory loss   . Nasal septal deviation   . Persistent atrial fibrillation ([place])   . Presence of permanent cardiac pacemaker   . 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 Severe aortic stenosis   . Tremor     Past Surgical History:  Procedure Laterality Date  . CATARACT EXTRACTION W/ INTRAOCULAR LENS  IMPLANT, BILATERAL Bilateral ~ 2000  . EP IMPLANTABLE DEVICE N/A 02/11/2016   Procedure: Pacemaker Implant;  Surgeon: Barsky Lance,  MD;  Location: [place] [facility];  Service: Cardiovascular;  Laterality: N/A;  . RIGHT/LEFT HEART CATH AND CORONARY ANGIOGRAPHY N/A 05/03/2018   Procedure: RIGHT/LEFT HEART CATH AND CORONARY ANGIOGRAPHY;  Surgeon: Burnell Blanks, MD;  Location: [place] [facility];  Service: Cardiovascular;  Laterality: N/A;  . TEE WITHOUT CARDIOVERSION N/A 05/30/2018   Procedure: TRANSESOPHAGEAL ECHOCARDIOGRAM (TEE);  Surgeon: Burnell Blanks, MD;  Location: [facility];  Service: Open Heart Surgery;  Laterality: N/A;  . TONSILLECTOMY  1930s  . TRANSCATHETER AORTIC VALVE REPLACEMENT, TRANSFEMORAL N/A 05/30/2018   Procedure: TRANSCATHETER AORTIC VALVE REPLACEMENT, TRANSFEMORAL using a 45mm Edwards Sapien 3 Aortic Valve;  Surgeon: Burnell Blanks, MD;  Location: [facility];  Service: Open Heart Surgery;  Laterality: N/A;  . WISDOM TOOTH EXTRACTION  1990s    No Known Allergies  Outpatient Encounter Medications as of 12/26/2020  Medication Sig  . clonazePAM (KLONOPIN) 0.5 MG tablet Take 1 tablet (0.5 mg total) by mouth 2 (two) times daily.  Marland Kitchen acetaminophen (TYLENOL) 500 MG tablet Take 500-1,000 mg by mouth every 6 (six) hours as needed for mild pain, fever or headache.  Marland Kitchen acetaminophen (TYLENOL) 500 MG tablet Take 1,000 mg by mouth daily.  Marland Kitchen ALPRAZolam (XANAX) 0.25 MG tablet Take 0.25 mg by mouth 3 (three) times daily as needed for anxiety.  Marland Kitchen amLODipine (NORVASC) 5 MG tablet Take 1 tablet by mouth daily.  Marland Kitchen ipratropium (ATROVENT) 0.03 % nasal spray Place 2 sprays  into both nostrils 4 (four) times daily as needed for rhinitis.  . mirtazapine (REMERON) 7.5 MG tablet Take 7.5 mg by mouth daily.  Marland Kitchen oxyCODONE-acetaminophen (PERCOCET) 5-325 MG tablet Take 1-2 tablets by mouth every 6 (six) hours as needed. (Patient taking differently: Take 1-2 tablets by mouth every 6 (six) hours as needed for moderate pain.)  . polyvinyl alcohol (LIQUIFILM TEARS) 1.4 % ophthalmic solution Place 1 drop into both eyes as  needed for dry eyes.  Marland Kitchen QUEtiapine (SEROQUEL) 50 MG tablet Take 1.5 tablets (75 mg total) by mouth at bedtime. (Patient taking differently: Take 100 mg by mouth at bedtime.)  . tamsulosin (FLOMAX) 0.4 MG CAPS capsule Take 1 capsule (0.4 mg total) by mouth daily.   No facility-administered encounter medications on file as of 12/26/2020.    Review of Systems:  Review of Systems  Unable to perform ROS: Dementia    Health Maintenance  Topic Date Due  . COVID-19 Vaccine (1) Never done  . TETANUS/TDAP  11/13/2028  . INFLUENZA VACCINE  Completed  . PNA vac Low Risk Adult  Completed  . HPV VACCINES  Aged Out    Physical Exam: Vitals:   12/27/20 1616  BP: 116/64  Pulse: 60  Resp: 20  Temp: (!) 97.3 F (36.3 C)   There is no height or weight on file to calculate BMI. Physical Exam Vitals reviewed.  Constitutional:      Appearance: Normal appearance.  HENT:     Head: Normocephalic.     Nose: Nose normal.     Mouth/Throat:     Mouth: Mucous membranes are moist.     Pharynx: Oropharynx is clear.  Eyes:     Pupils: Pupils are equal, round, and reactive to light.  Cardiovascular:     Rate and Rhythm: Normal rate and regular rhythm.     Pulses: Normal pulses.  Pulmonary:     Effort: Pulmonary effort is normal.     Breath sounds: Normal breath sounds.  Abdominal:     General: Abdomen is flat. Bowel sounds are normal.     Palpations: Abdomen is soft.  Musculoskeletal:        General: No swelling.     Cervical back: Neck supple.  Skin:    General: Skin is warm.  Neurological:     General: No focal deficit present.     Mental Status: He is alert.     Comments: Alert but does not follow commands Has aphasia Trying to get up from his chair Very restless    Psychiatric:        Mood and Affect: Mood normal.        Thought Content: Thought content normal.     Labs reviewed: Basic Metabolic Panel: Recent Labs    11/20/20 1123 11/21/20 0948 11/21/20 1034  11/22/20 0514  NA 135  --  134* 136  K 4.7  --  4.3 4.5  CL 98  --  100 103  CO2 18*  --  23 24  GLUCOSE 83  --  71 84  BUN 23  --  24* 19  CREATININE 1.26  --  1.24 1.06  CALCIUM 9.5  --  9.3 8.8*  TSH 2.620 2.277  --   --    Liver Function Tests: Recent Labs    11/20/20 1123 11/21/20 1034 11/22/20 0514  AST 52* 60* 55*  ALT 40 48* 40  ALKPHOS 133* 105 83  BILITOT 0.6 0.9 0.9  PROT 6.9 7.5 6.2*  ALBUMIN 4.3 4.2 3.4*   Recent Labs    11/13/20 1916  LIPASE 32   No results for input(s): AMMONIA in the last 8760 hours. CBC: Recent Labs    11/20/20 1123 11/21/20 1034 11/22/20 0514  WBC 9.3 8.0 7.7  NEUTROABS 7.0 6.1 5.6  HGB 14.9 15.7 13.3  HCT 43.5 48.4 40.7  MCV 92 95.3 95.3  PLT 168 151 140*   Lipid Panel: Recent Labs    11/22/20 0514  CHOL 127  HDL 52  LDLCALC 65  TRIG 50  CHOLHDL 2.4   Lab Results  Component Value Date   HGBA1C 5.8 (H) 11/22/2020    Procedures since last visit: CUP PACEART REMOTE DEVICE CHECK  Result Date: 12/12/2020 Scheduled remote reviewed. Normal device function.  1 VHR event logged 12 seconds w/ rate 170's bpm; no EGM available for review. Next remote 91 days. HB   Assessment/Plan Mixed Alzheimer's and vascular dementia with behavior disturbances (HCC) Change Seroquel to 100 mg Start on Klonipin 0.5 mg BID Continue Xanax  Leukocytosis, unspecified type Labs are Pending Including Chest Xray and Urine Analysis  Chronic indwelling Foley catheter Failed Voiding trial On Flomax now Had Urology Consult Don't have notes but for now has Chronic Foley Primary hypertension On Amlodipine Dysphagia, unspecified type On Dysphagic Nectar thick diet Persistent atrial fibrillation ([place]) Off Eliquis due to Hematuria  Adult failure to thrive On Remeron. Supportive care Family wants Comfort care Is enrolled in Therapy Hypercholesteremia Off statin   Labs/tests ordered:  * No order type specified * Next appt:  Visit  date not found

## 2020-12-27 ENCOUNTER — Encounter: Payer: Self-pay | Admitting: Internal Medicine

## 2020-12-29 DIAGNOSIS — R278 Other lack of coordination: Secondary | ICD-10-CM | POA: Diagnosis not present

## 2020-12-29 DIAGNOSIS — R2681 Unsteadiness on feet: Secondary | ICD-10-CM | POA: Diagnosis not present

## 2020-12-29 DIAGNOSIS — R29898 Other symptoms and signs involving the musculoskeletal system: Secondary | ICD-10-CM | POA: Diagnosis not present

## 2020-12-29 DIAGNOSIS — M6281 Muscle weakness (generalized): Secondary | ICD-10-CM | POA: Diagnosis not present

## 2020-12-29 DIAGNOSIS — N390 Urinary tract infection, site not specified: Secondary | ICD-10-CM | POA: Diagnosis not present

## 2020-12-29 DIAGNOSIS — R1312 Dysphagia, oropharyngeal phase: Secondary | ICD-10-CM | POA: Diagnosis not present

## 2020-12-30 DIAGNOSIS — N390 Urinary tract infection, site not specified: Secondary | ICD-10-CM | POA: Diagnosis not present

## 2020-12-31 DIAGNOSIS — R278 Other lack of coordination: Secondary | ICD-10-CM | POA: Diagnosis not present

## 2020-12-31 DIAGNOSIS — M6281 Muscle weakness (generalized): Secondary | ICD-10-CM | POA: Diagnosis not present

## 2020-12-31 DIAGNOSIS — N390 Urinary tract infection, site not specified: Secondary | ICD-10-CM | POA: Diagnosis not present

## 2020-12-31 DIAGNOSIS — R2681 Unsteadiness on feet: Secondary | ICD-10-CM | POA: Diagnosis not present

## 2020-12-31 DIAGNOSIS — R29898 Other symptoms and signs involving the musculoskeletal system: Secondary | ICD-10-CM | POA: Diagnosis not present

## 2020-12-31 DIAGNOSIS — R1312 Dysphagia, oropharyngeal phase: Secondary | ICD-10-CM | POA: Diagnosis not present

## 2021-01-01 DIAGNOSIS — N390 Urinary tract infection, site not specified: Secondary | ICD-10-CM | POA: Diagnosis not present

## 2021-01-01 DIAGNOSIS — R278 Other lack of coordination: Secondary | ICD-10-CM | POA: Diagnosis not present

## 2021-01-01 DIAGNOSIS — R2681 Unsteadiness on feet: Secondary | ICD-10-CM | POA: Diagnosis not present

## 2021-01-01 DIAGNOSIS — R29898 Other symptoms and signs involving the musculoskeletal system: Secondary | ICD-10-CM | POA: Diagnosis not present

## 2021-01-01 DIAGNOSIS — R1312 Dysphagia, oropharyngeal phase: Secondary | ICD-10-CM | POA: Diagnosis not present

## 2021-01-01 DIAGNOSIS — M6281 Muscle weakness (generalized): Secondary | ICD-10-CM | POA: Diagnosis not present

## 2021-01-02 DIAGNOSIS — R278 Other lack of coordination: Secondary | ICD-10-CM | POA: Diagnosis not present

## 2021-01-02 DIAGNOSIS — R2681 Unsteadiness on feet: Secondary | ICD-10-CM | POA: Diagnosis not present

## 2021-01-02 DIAGNOSIS — R41841 Cognitive communication deficit: Secondary | ICD-10-CM | POA: Diagnosis not present

## 2021-01-02 DIAGNOSIS — N390 Urinary tract infection, site not specified: Secondary | ICD-10-CM | POA: Diagnosis not present

## 2021-01-02 DIAGNOSIS — M6281 Muscle weakness (generalized): Secondary | ICD-10-CM | POA: Diagnosis not present

## 2021-01-02 DIAGNOSIS — R1312 Dysphagia, oropharyngeal phase: Secondary | ICD-10-CM | POA: Diagnosis not present

## 2021-01-02 DIAGNOSIS — R29898 Other symptoms and signs involving the musculoskeletal system: Secondary | ICD-10-CM | POA: Diagnosis not present

## 2021-01-05 ENCOUNTER — Encounter: Payer: Self-pay | Admitting: Nurse Practitioner

## 2021-01-05 ENCOUNTER — Non-Acute Institutional Stay (SKILLED_NURSING_FACILITY): Payer: Medicare Other | Admitting: Nurse Practitioner

## 2021-01-05 ENCOUNTER — Encounter: Payer: Self-pay | Admitting: Internal Medicine

## 2021-01-05 DIAGNOSIS — R278 Other lack of coordination: Secondary | ICD-10-CM | POA: Diagnosis not present

## 2021-01-05 DIAGNOSIS — Z952 Presence of prosthetic heart valve: Secondary | ICD-10-CM | POA: Diagnosis not present

## 2021-01-05 DIAGNOSIS — R2681 Unsteadiness on feet: Secondary | ICD-10-CM | POA: Diagnosis not present

## 2021-01-05 DIAGNOSIS — M159 Polyosteoarthritis, unspecified: Secondary | ICD-10-CM

## 2021-01-05 DIAGNOSIS — Z950 Presence of cardiac pacemaker: Secondary | ICD-10-CM

## 2021-01-05 DIAGNOSIS — T83511A Infection and inflammatory reaction due to indwelling urethral catheter, initial encounter: Secondary | ICD-10-CM

## 2021-01-05 DIAGNOSIS — N390 Urinary tract infection, site not specified: Secondary | ICD-10-CM | POA: Insufficient documentation

## 2021-01-05 DIAGNOSIS — I4819 Other persistent atrial fibrillation: Secondary | ICD-10-CM

## 2021-01-05 DIAGNOSIS — F0281 Dementia in other diseases classified elsewhere with behavioral disturbance: Secondary | ICD-10-CM

## 2021-01-05 DIAGNOSIS — R269 Unspecified abnormalities of gait and mobility: Secondary | ICD-10-CM

## 2021-01-05 DIAGNOSIS — R7303 Prediabetes: Secondary | ICD-10-CM | POA: Diagnosis not present

## 2021-01-05 DIAGNOSIS — G250 Essential tremor: Secondary | ICD-10-CM

## 2021-01-05 DIAGNOSIS — I10 Essential (primary) hypertension: Secondary | ICD-10-CM

## 2021-01-05 DIAGNOSIS — Z978 Presence of other specified devices: Secondary | ICD-10-CM | POA: Diagnosis not present

## 2021-01-05 DIAGNOSIS — G301 Alzheimer's disease with late onset: Secondary | ICD-10-CM

## 2021-01-05 DIAGNOSIS — E7800 Pure hypercholesterolemia, unspecified: Secondary | ICD-10-CM | POA: Diagnosis not present

## 2021-01-05 DIAGNOSIS — R1310 Dysphagia, unspecified: Secondary | ICD-10-CM

## 2021-01-05 DIAGNOSIS — R29898 Other symptoms and signs involving the musculoskeletal system: Secondary | ICD-10-CM | POA: Diagnosis not present

## 2021-01-05 DIAGNOSIS — R319 Hematuria, unspecified: Secondary | ICD-10-CM

## 2021-01-05 DIAGNOSIS — F0394 Unspecified dementia, unspecified severity, with anxiety: Secondary | ICD-10-CM

## 2021-01-05 DIAGNOSIS — R627 Adult failure to thrive: Secondary | ICD-10-CM

## 2021-01-05 DIAGNOSIS — M6281 Muscle weakness (generalized): Secondary | ICD-10-CM | POA: Diagnosis not present

## 2021-01-05 DIAGNOSIS — F0391 Unspecified dementia with behavioral disturbance: Secondary | ICD-10-CM

## 2021-01-05 DIAGNOSIS — R1312 Dysphagia, oropharyngeal phase: Secondary | ICD-10-CM | POA: Diagnosis not present

## 2021-01-05 DIAGNOSIS — G309 Alzheimer's disease, unspecified: Secondary | ICD-10-CM

## 2021-01-05 DIAGNOSIS — F0151 Vascular dementia with behavioral disturbance: Secondary | ICD-10-CM

## 2021-01-05 NOTE — Assessment & Plan Note (Signed)
S/p TAVR 

## 2021-01-05 NOTE — Assessment & Plan Note (Signed)
w/c for mobility, currently ambulating with walker with therapy.

## 2021-01-05 NOTE — Assessment & Plan Note (Signed)
Amlodipine. Bun/creat 23/0.95 12/23/20

## 2021-01-05 NOTE — Assessment & Plan Note (Signed)
LDL 65

## 2021-01-05 NOTE — Assessment & Plan Note (Addendum)
Vascular dementia with behavioral disturbance, underwent neurology eval, takesAlprazolam. Palliative care, may progress to Hospice. No heroic measures. Vit B12 2359 11/22/20. Takes Quetiapine, added Klonopin 0.5mg  bid 12/26/20, takes prn Alprazolam,  off Memantine/Donepezil.also takes Mirtazapine 15 mg since 12/22/20

## 2021-01-05 NOTE — Assessment & Plan Note (Signed)
chronic Foley Catheter, underwent urology evaluation, takes Flomax

## 2021-01-05 NOTE — Assessment & Plan Note (Signed)
mild

## 2021-01-05 NOTE — Assessment & Plan Note (Signed)
off Eliquis due to hematuria.

## 2021-01-05 NOTE — Progress Notes (Signed)
Location:   SNF [place] Room Number: 17 Place of Service:  SNF (31) Provider: Lennie Odor Americus Scheurich NP  [name], MD  Patient Care Team: [name], MD as PCP - General (Internal Medicine) Monna Fam, MD as Consulting Physician (Ophthalmology)  Extended Emergency Contact Information Primary Emergency Contact: [name] Address: [address]          [place], [place] [address] Jeffery Rogers of [place] Phone: [phone] Relation: Spouse Secondary Emergency Contact: [name] States of [employer] Phone: [phone] Relation: Daughter Interpreter needed? No  Code Status: DNR Goals of care: Advanced Directive information Advanced Directives 12/17/2020  Does Patient Have a Medical Advance Directive? Yes  Type of Paramedic of [place];Living will  Does patient want to make changes to medical advance directive? -  Copy of [place] in Chart? No - copy requested     Chief Complaint  Patient presents with  . Acute Visit    Blood in urine.     HPI:  Pt is a 87 y.o. male seen today for an acute visit for blood in urine in Foley drainage bag, penis swelling, traumatized. No pain palpated in the lower abd, afebrile.   UTI MRSA Nitrofurantoin 100mg  bid x 7 days initiated 01/02/21 when urine culture is available.   Dysphagia nectar thick per ST Vascular dementia with behavioral disturbance, underwent neurology eval, takesAlprazolam. Palliative care, may progress to Hospice. No heroic measures. Vit B12 2359 11/22/20. Takes Quetiapine, added Klonopin 0.5mg  bid 12/26/20, takes prn Alprazolam,  off Memantine/Donepezil.also takes Mirtazapine 15 mg since 12/22/20             Anxiety/depression, takes  Mirtazapine 15mg  increased 12/22/20, also prn Alprazolam available to him  MRI L4-5 moderate spnal stenosis, no back pain noted while the patient in w/c.  Essential  tremor, chronic.  S/p TAVR Afib, off Eliquis due to hematuria.  Urinary retention, chronic Foley Catheter, underwent urology evaluation, takes Flomax Complete heart block s/p pacer Hyperlipidemia LDL 65 Gait abnormality, w/c for mobility, currently ambulating with walker with therapy. HTN, Amlodipine. Bun/creat 23/0.95 12/23/20 FTT advanced dementia, palliative care, takes Mirtazapine.Weight has been stable. OA, on prn Oxycodone, Tylenol  Prediabetes, Hgb a1c 5.8 11/22/20    Past Medical History:  Diagnosis Date  . Arthritis    "right arm/shoulder" (02/10/2016)  . Complete heart block ([place]) 02/10/2016   a. s/p Emergency planning/management officer  . Goiter   . Hypercholesteremia   . Memory loss   . Nasal septal deviation   . Persistent atrial fibrillation ([place])   . Presence of permanent cardiac pacemaker   . 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 Severe aortic stenosis   . Tremor    Past Surgical History:  Procedure Laterality Date  . CATARACT EXTRACTION W/ INTRAOCULAR LENS  IMPLANT, BILATERAL Bilateral ~ 2000  . EP IMPLANTABLE DEVICE N/A 02/11/2016   Procedure: Pacemaker Implant;  Surgeon: Spagna Lance, MD;  Location: [facility];  Service: Cardiovascular;  Laterality: N/A;  . RIGHT/LEFT HEART CATH AND CORONARY ANGIOGRAPHY N/A 05/03/2018   Procedure: RIGHT/LEFT HEART CATH AND CORONARY ANGIOGRAPHY;  Surgeon: Burnell Blanks, MD;  Location: [facility];  Service: Cardiovascular;  Laterality: N/A;  . TEE WITHOUT CARDIOVERSION N/A 05/30/2018   Procedure: TRANSESOPHAGEAL ECHOCARDIOGRAM (TEE);  Surgeon: Burnell Blanks, MD;  Location:  [facility] OR;  Service: Open Heart Surgery;  Laterality: N/A;  . TONSILLECTOMY  1930s  .  TRANSCATHETER AORTIC VALVE REPLACEMENT, TRANSFEMORAL N/A 05/30/2018   Procedure: TRANSCATHETER AORTIC VALVE REPLACEMENT, TRANSFEMORAL using a 68mm Edwards Sapien 3 Aortic Valve;  Surgeon: Burnell Blanks, MD;  Location: [facility];  Service: Open Heart Surgery;  Laterality: N/A;  . WISDOM TOOTH EXTRACTION  1990s    No Known Allergies  Allergies as of 01/05/2021   No Known Allergies     Medication List       Accurate as of January 05, 2021 11:59 PM. If you have any questions, ask your nurse or doctor.        acetaminophen 500 MG tablet Commonly known as: TYLENOL Take 500-1,000 mg by mouth every 6 (six) hours as needed for mild pain, fever or headache.   acetaminophen 500 MG tablet Commonly known as: TYLENOL Take 1,000 mg by mouth daily.   ALPRAZolam 0.25 MG tablet Commonly known as: XANAX Take 0.25 mg by mouth 3 (three) times daily as needed for anxiety.   amLODipine 5 MG tablet Commonly known as: NORVASC Take 1 tablet by mouth daily.   clonazePAM 0.5 MG tablet Commonly known as: KLONOPIN Take 1 tablet (0.5 mg total) by mouth 2 (two) times daily.   ipratropium 0.03 % nasal spray Commonly known as: ATROVENT Place 2 sprays into both nostrils 4 (four) times daily as needed for rhinitis.   mirtazapine 15 MG tablet Commonly known as: REMERON Take 15 mg by mouth daily.   oxyCODONE-acetaminophen 5-325 MG tablet Commonly known as: Percocet Take 1-2 tablets by mouth every 6 (six) hours as needed. What changed: reasons to take this   polyvinyl alcohol 1.4 % ophthalmic solution Commonly known as: LIQUIFILM TEARS Place 1 drop into both eyes as needed for dry eyes.   QUEtiapine 50 MG tablet Commonly known as: SEROQUEL Take 1.5 tablets (75 mg total) by mouth at bedtime. What changed: how much to take   tamsulosin 0.4 MG Caps capsule Commonly known as: FLOMAX Take 1 capsule (0.4 mg total) by mouth daily.       Review of Systems  Constitutional: Positive for fatigue.  Negative for appetite change and fever.  HENT: Positive for hearing loss and trouble swallowing. Negative for congestion and voice change.        Nectar thick liquids.   Respiratory: Negative for cough and shortness of breath.   Cardiovascular: Negative for leg swelling.  Gastrointestinal: Negative for abdominal pain and constipation.  Genitourinary: Positive for difficulty urinating, hematuria, penile pain and penile swelling.       Foley. Penile redness, swelling, blood blots seen around catheter, blood in urine in Foley drainage bag.   Musculoskeletal: Positive for arthralgias, back pain and gait problem.  Skin: Negative for color change.  Neurological: Positive for tremors and speech difficulty. Negative for weakness and headaches.       Dementia, expressive aphasia.   Psychiatric/Behavioral: Positive for confusion. Negative for behavioral problems, hallucinations and sleep disturbance. The patient is nervous/anxious.        Restlessness, pacing, early am awake.     Immunization History  Administered Date(s) Administered  . Influenza, High Dose Seasonal PF 08/01/2014, 06/16/2015, 06/17/2016  . Influenza, Quadrivalent, Recombinant, Inj, Pf 07/26/2017, 07/21/2018, 06/08/2019, 07/02/2020  . Influenza-Unspecified 07/20/2013  . Pneumococcal Conjugate-13 01/16/2013  . Pneumococcal Polysaccharide-23 01/07/1999  . Td 11/13/2018  . Tdap 01/11/2012  . Zoster 09/07/2006  . Zoster Recombinat (Shingrix) 02/27/2019, 02/27/2019   Pertinent  Health Maintenance Due  Topic Date Due  . INFLUENZA VACCINE  05/04/2021  . PNA vac Low Risk Adult  Completed   Fall Risk  03/06/2019  Falls in the past year? 1  Number falls in past yr: 1  Injury with Fall? 0   Functional Status Survey:    Vitals:   01/05/21 1546  BP: 120/60  Pulse: 68  Resp: 20  Temp: (!) 97.4 F (36.3 C)  SpO2: 98%  Weight: 153 lb (69.4 kg)   Body mass index is 21.95 kg/m. Physical Exam Vitals and nursing note reviewed.   Constitutional:      Appearance: Normal appearance.  HENT:     Head: Normocephalic and atraumatic.     Nose: Nose normal.     Mouth/Throat:     Mouth: Mucous membranes are moist.  Eyes:     Extraocular Movements: Extraocular movements intact.     Conjunctiva/sclera: Conjunctivae normal.     Pupils: Pupils are equal, round, and reactive to light.  Cardiovascular:     Rate and Rhythm: Normal rate. Rhythm irregular.     Heart sounds: Murmur heard.    Pulmonary:     Effort: Pulmonary effort is normal.     Breath sounds: No rales.  Abdominal:     General: Bowel sounds are normal.     Palpations: Abdomen is soft.     Tenderness: There is no abdominal tenderness.  Genitourinary:    Comments: Foley. Penile redness, swelling, blood blots seen around catheter, blood in urine in Foley drainage bag.   Musculoskeletal:     Cervical back: Normal range of motion and neck supple.     Right lower leg: No edema.     Left lower leg: No edema.  Skin:    General: Skin is warm and dry.     Comments: Skin tear left hand palm, no active bleeding or s/s of infection.   Neurological:     General: No focal deficit present.     Mental Status: He is alert. Mental status is at baseline.     Motor: No weakness.     Coordination: Coordination abnormal.     Gait: Gait abnormal.     Comments: Oriented to self. Followed simple directions during my examination. Ambulates with walker with therapy, otherwise w/c for mobility.   Psychiatric:        Mood and Affect: Mood normal.        Behavior: Behavior normal.     Labs reviewed: Recent Labs    11/20/20 1123 11/21/20 1034 11/22/20 0514 12/23/20 0000  NA 135 134* 136 141  K 4.7 4.3 4.5 3.9  CL 98 100 103 105  CO2 18* 23 24 22   GLUCOSE 83 71 84  --   BUN 23 24* 19 23*  CREATININE 1.26 1.24 1.06 1.0  CALCIUM 9.5 9.3 8.8* 8.9   Recent Labs    11/20/20 1123 11/21/20 1034 11/22/20 0514 12/23/20 0000  AST 52* 60* 55* 72*  ALT 40 48* 40 39   ALKPHOS 133* 105 83 101  BILITOT 0.6 0.9 0.9  --   PROT 6.9 7.5 6.2*  --   ALBUMIN 4.3 4.2 3.4* 3.5   Recent Labs    11/20/20 1123 11/21/20 1034 11/22/20 0514 12/23/20 0000  WBC 9.3 8.0 7.7 16.3  NEUTROABS 7.0 6.1 5.6 14,116.00  HGB 14.9 15.7 13.3 12.7*  HCT 43.5 48.4 40.7 39*  MCV 92 95.3 95.3  --   PLT 168 151 140*  188   Lab Results  Component Value Date   TSH 2.277 11/21/2020   Lab Results  Component Value Date   HGBA1C 5.8 (H) 11/22/2020   Lab Results  Component Value Date   CHOL 127 11/22/2020   HDL 52 11/22/2020   LDLCALC 65 11/22/2020   TRIG 50 11/22/2020   CHOLHDL 2.4 11/22/2020    Significant Diagnostic Results in last 30 days:  CUP PACEART REMOTE DEVICE CHECK  Result Date: 12/12/2020 Scheduled remote reviewed. Normal device function.  1 VHR event logged 12 seconds w/ rate 170's bpm; no EGM available for review. Next remote 91 days. HB   Assessment/Plan: Blood in urine In Foley urine collecting bad as well around catheter, penis is swelling, redness-trauma from the patient pulling on it, will increase Tylenol 1000mg  to bid x 1 week, completion of 7 day course of Nitrofurantoin, may NS flush Foley q shift x 7 days. Observe.   Osteoarthritis, generalized on prn Oxycodone, Tylenol, MRI L4-5 moderate spnal stenosis, no back pain noted while the patient in w/c.    Prediabetes Hgb a1c 5.8 11/22/20   Adult failure to thrive advanced dementia, palliative care, takes Mirtazapine.Weight has been stable.   HTN (hypertension) Amlodipine. Bun/creat 23/0.95 12/23/20   Gait abnormality w/c for mobility, currently ambulating with walker with therapy.  Hypercholesteremia LDL 65   Presence of permanent cardiac pacemaker Complete heart block s/p pacer   Chronic indwelling Foley catheter chronic Foley Catheter, underwent urology evaluation, takes Flomax  Persistent atrial fibrillation (HCC) off Eliquis due to hematuria.   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 Palliative care, may progress to Hospice. No heroic measures. Vit B12 2359 11/22/20. Takes Quetiapine, added Klonopin 0.5mg  bid 12/26/20, takes prn Alprazolam,  off Memantine/Donepezil.also takes Mirtazapine 15 mg since 12/22/20   Dysphagia nectar thick per ST   UTI (urinary tract infection) due to urinary indwelling Foley catheter (HCC) MRSA Nitrofurantoin 100mg  bid x 7 days initiated 01/02/21 when urine culture is available.    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 Palliative care, may progress to Hospice. No heroic measures. Vit B12 2359 11/22/20. Takes Quetiapine, added Klonopin 0.5mg  bid 12/26/20, takes prn Alprazolam,  off Memantine/Donepezil.also takes Mirtazapine 15 mg since 12/22/20     Family/ staff Communication: plan of care reviewed with the patient and charge nurse.   Labs/tests ordered:  none  Time spend 35 minutes.

## 2021-01-05 NOTE — Assessment & Plan Note (Signed)
takes  Mirtazapine 15mg  increased 12/22/20, also prn Alprazolam available to him

## 2021-01-05 NOTE — Assessment & Plan Note (Signed)
In Foley urine collecting bad as well around catheter, penis is swelling, redness-trauma from the patient pulling on it, will increase Tylenol 1000mg  to bid x 1 week, completion of 7 day course of Nitrofurantoin, may NS flush Foley q shift x 7 days. Observe.

## 2021-01-05 NOTE — Assessment & Plan Note (Addendum)
on prn Oxycodone, Tylenol, MRI L4-5 moderate spnal stenosis, no back pain noted while the patient in w/c.

## 2021-01-05 NOTE — Assessment & Plan Note (Signed)
Hgb a1c 5.8 11/22/20

## 2021-01-05 NOTE — Assessment & Plan Note (Signed)
Complete heart block s/p pacer

## 2021-01-05 NOTE — Assessment & Plan Note (Signed)
nectar thick per ST

## 2021-01-05 NOTE — Assessment & Plan Note (Signed)
advanced dementia, palliative care, takes Mirtazapine.Weight has been stable.

## 2021-01-05 NOTE — Assessment & Plan Note (Signed)
MRSA Nitrofurantoin 100mg  bid x 7 days initiated 01/02/21 when urine culture is available.

## 2021-01-06 ENCOUNTER — Other Ambulatory Visit: Payer: Self-pay

## 2021-01-06 ENCOUNTER — Encounter: Payer: Self-pay | Admitting: Internal Medicine

## 2021-01-06 ENCOUNTER — Non-Acute Institutional Stay (SKILLED_NURSING_FACILITY): Payer: Medicare Other | Admitting: Internal Medicine

## 2021-01-06 ENCOUNTER — Inpatient Hospital Stay (HOSPITAL_COMMUNITY)
Admission: EM | Admit: 2021-01-06 | Discharge: 2021-01-13 | DRG: 698 | Disposition: A | Discharge status: Skilled Nursing Facility | Payer: Medicare Other | Source: Skilled Nursing Facility | Attending: Internal Medicine | Admitting: Internal Medicine

## 2021-01-06 DIAGNOSIS — I4819 Other persistent atrial fibrillation: Secondary | ICD-10-CM | POA: Diagnosis present

## 2021-01-06 DIAGNOSIS — I10 Essential (primary) hypertension: Secondary | ICD-10-CM | POA: Diagnosis present

## 2021-01-06 DIAGNOSIS — Z8249 Family history of ischemic heart disease and other diseases of the circulatory system: Secondary | ICD-10-CM

## 2021-01-06 DIAGNOSIS — R310 Gross hematuria: Secondary | ICD-10-CM | POA: Diagnosis present

## 2021-01-06 DIAGNOSIS — R402421 Glasgow coma scale score 9-12, in the field [EMT or ambulance]: Secondary | ICD-10-CM | POA: Diagnosis not present

## 2021-01-06 DIAGNOSIS — Z978 Presence of other specified devices: Secondary | ICD-10-CM | POA: Diagnosis not present

## 2021-01-06 DIAGNOSIS — G309 Alzheimer's disease, unspecified: Secondary | ICD-10-CM | POA: Diagnosis not present

## 2021-01-06 DIAGNOSIS — L89892 Pressure ulcer of other site, stage 2: Secondary | ICD-10-CM | POA: Clinically undetermined

## 2021-01-06 DIAGNOSIS — T83511A Infection and inflammatory reaction due to indwelling urethral catheter, initial encounter: Secondary | ICD-10-CM | POA: Diagnosis not present

## 2021-01-06 DIAGNOSIS — G9340 Encephalopathy, unspecified: Secondary | ICD-10-CM

## 2021-01-06 DIAGNOSIS — Z66 Do not resuscitate: Secondary | ICD-10-CM | POA: Diagnosis present

## 2021-01-06 DIAGNOSIS — Z20822 Contact with and (suspected) exposure to covid-19: Secondary | ICD-10-CM | POA: Diagnosis not present

## 2021-01-06 DIAGNOSIS — I350 Nonrheumatic aortic (valve) stenosis: Secondary | ICD-10-CM | POA: Diagnosis not present

## 2021-01-06 DIAGNOSIS — N472 Paraphimosis: Secondary | ICD-10-CM | POA: Diagnosis not present

## 2021-01-06 DIAGNOSIS — Z823 Family history of stroke: Secondary | ICD-10-CM

## 2021-01-06 DIAGNOSIS — Z953 Presence of xenogenic heart valve: Secondary | ICD-10-CM

## 2021-01-06 DIAGNOSIS — F0151 Vascular dementia with behavioral disturbance: Secondary | ICD-10-CM | POA: Diagnosis not present

## 2021-01-06 DIAGNOSIS — F909 Attention-deficit hyperactivity disorder, unspecified type: Secondary | ICD-10-CM | POA: Diagnosis not present

## 2021-01-06 DIAGNOSIS — N390 Urinary tract infection, site not specified: Secondary | ICD-10-CM | POA: Diagnosis not present

## 2021-01-06 DIAGNOSIS — Z952 Presence of prosthetic heart valve: Secondary | ICD-10-CM | POA: Diagnosis not present

## 2021-01-06 DIAGNOSIS — R339 Retention of urine, unspecified: Secondary | ICD-10-CM

## 2021-01-06 DIAGNOSIS — Z87891 Personal history of nicotine dependence: Secondary | ICD-10-CM

## 2021-01-06 DIAGNOSIS — T83098A Other mechanical complication of other indwelling urethral catheter, initial encounter: Secondary | ICD-10-CM | POA: Diagnosis not present

## 2021-01-06 DIAGNOSIS — T83091A Other mechanical complication of indwelling urethral catheter, initial encounter: Secondary | ICD-10-CM | POA: Diagnosis not present

## 2021-01-06 DIAGNOSIS — A419 Sepsis, unspecified organism: Secondary | ICD-10-CM

## 2021-01-06 DIAGNOSIS — F0150 Vascular dementia without behavioral disturbance: Secondary | ICD-10-CM | POA: Diagnosis present

## 2021-01-06 DIAGNOSIS — D638 Anemia in other chronic diseases classified elsewhere: Secondary | ICD-10-CM | POA: Diagnosis present

## 2021-01-06 DIAGNOSIS — L89156 Pressure-induced deep tissue damage of sacral region: Secondary | ICD-10-CM | POA: Diagnosis not present

## 2021-01-06 DIAGNOSIS — T839XXA Unspecified complication of genitourinary prosthetic device, implant and graft, initial encounter: Secondary | ICD-10-CM

## 2021-01-06 DIAGNOSIS — A4102 Sepsis due to Methicillin resistant Staphylococcus aureus: Secondary | ICD-10-CM | POA: Diagnosis not present

## 2021-01-06 DIAGNOSIS — R6520 Severe sepsis without septic shock: Secondary | ICD-10-CM | POA: Diagnosis not present

## 2021-01-06 DIAGNOSIS — R404 Transient alteration of awareness: Secondary | ICD-10-CM | POA: Diagnosis not present

## 2021-01-06 DIAGNOSIS — Z950 Presence of cardiac pacemaker: Secondary | ICD-10-CM

## 2021-01-06 DIAGNOSIS — R34 Anuria and oliguria: Secondary | ICD-10-CM | POA: Diagnosis not present

## 2021-01-06 DIAGNOSIS — L89150 Pressure ulcer of sacral region, unstageable: Secondary | ICD-10-CM | POA: Clinically undetermined

## 2021-01-06 DIAGNOSIS — L8990 Pressure ulcer of unspecified site, unspecified stage: Secondary | ICD-10-CM | POA: Insufficient documentation

## 2021-01-06 DIAGNOSIS — R509 Fever, unspecified: Secondary | ICD-10-CM

## 2021-01-06 DIAGNOSIS — I442 Atrioventricular block, complete: Secondary | ICD-10-CM | POA: Diagnosis present

## 2021-01-06 DIAGNOSIS — E049 Nontoxic goiter, unspecified: Secondary | ICD-10-CM | POA: Diagnosis present

## 2021-01-06 DIAGNOSIS — F419 Anxiety disorder, unspecified: Secondary | ICD-10-CM | POA: Diagnosis present

## 2021-01-06 DIAGNOSIS — R9431 Abnormal electrocardiogram [ECG] [EKG]: Secondary | ICD-10-CM | POA: Diagnosis not present

## 2021-01-06 DIAGNOSIS — Z6821 Body mass index (BMI) 21.0-21.9, adult: Secondary | ICD-10-CM

## 2021-01-06 DIAGNOSIS — Z7401 Bed confinement status: Secondary | ICD-10-CM

## 2021-01-06 DIAGNOSIS — R54 Age-related physical debility: Secondary | ICD-10-CM | POA: Diagnosis present

## 2021-01-06 DIAGNOSIS — M19011 Primary osteoarthritis, right shoulder: Secondary | ICD-10-CM | POA: Diagnosis present

## 2021-01-06 DIAGNOSIS — R338 Other retention of urine: Secondary | ICD-10-CM | POA: Diagnosis not present

## 2021-01-06 DIAGNOSIS — Z960 Presence of urogenital implants: Secondary | ICD-10-CM | POA: Diagnosis present

## 2021-01-06 DIAGNOSIS — R451 Restlessness and agitation: Secondary | ICD-10-CM | POA: Diagnosis not present

## 2021-01-06 DIAGNOSIS — R0689 Other abnormalities of breathing: Secondary | ICD-10-CM | POA: Diagnosis not present

## 2021-01-06 DIAGNOSIS — E785 Hyperlipidemia, unspecified: Secondary | ICD-10-CM | POA: Diagnosis present

## 2021-01-06 DIAGNOSIS — I2510 Atherosclerotic heart disease of native coronary artery without angina pectoris: Secondary | ICD-10-CM | POA: Diagnosis present

## 2021-01-06 DIAGNOSIS — I959 Hypotension, unspecified: Secondary | ICD-10-CM | POA: Diagnosis not present

## 2021-01-06 DIAGNOSIS — R0902 Hypoxemia: Secondary | ICD-10-CM | POA: Diagnosis not present

## 2021-01-06 LAB — COMPREHENSIVE METABOLIC PANEL
ALT: 35 U/L (ref 0–44)
AST: 41 U/L (ref 15–41)
Albumin: 2.9 g/dL — ABNORMAL LOW (ref 3.5–5.0)
Alkaline Phosphatase: 100 U/L (ref 38–126)
Anion gap: 8 (ref 5–15)
BUN: 25 mg/dL — ABNORMAL HIGH (ref 8–23)
CO2: 25 mmol/L (ref 22–32)
Calcium: 8.9 mg/dL (ref 8.9–10.3)
Chloride: 108 mmol/L (ref 98–111)
Creatinine, Ser: 1.09 mg/dL (ref 0.61–1.24)
GFR, Estimated: >60 mL/min (ref >60)
Glucose, Bld: 109 mg/dL — ABNORMAL HIGH (ref 70–99)
Potassium: 4.4 mmol/L (ref 3.5–5.1)
Sodium: 141 mmol/L (ref 135–145)
Total Bilirubin: 0.7 mg/dL (ref 0.3–1.2)
Total Protein: 6.8 g/dL (ref 6.5–8.1)

## 2021-01-06 LAB — CBC WITH DIFFERENTIAL/PLATELET
Abs Immature Granulocytes: 0.08 10*3/uL — ABNORMAL HIGH (ref 0.00–0.07)
Basophils Absolute: 0.1 10*3/uL (ref 0.0–0.1)
Basophils Relative: 0 %
Eosinophils Absolute: 0 10*3/uL (ref 0.0–0.5)
Eosinophils Relative: 0 %
HCT: 33.5 % — ABNORMAL LOW (ref 39.0–52.0)
Hemoglobin: 10.7 g/dL — ABNORMAL LOW (ref 13.0–17.0)
Immature Granulocytes: 1 %
Lymphocytes Relative: 5 %
Lymphs Abs: 0.7 10*3/uL (ref 0.7–4.0)
MCH: 30.7 pg (ref 26.0–34.0)
MCHC: 31.9 g/dL (ref 30.0–36.0)
MCV: 96 fL (ref 80.0–100.0)
Monocytes Absolute: 0.9 10*3/uL (ref 0.1–1.0)
Monocytes Relative: 7 %
Neutro Abs: 11.7 10*3/uL — ABNORMAL HIGH (ref 1.7–7.7)
Neutrophils Relative %: 87 %
Platelets: 271 10*3/uL (ref 150–400)
RBC: 3.49 MIL/uL — ABNORMAL LOW (ref 4.22–5.81)
RDW: 14.6 % (ref 11.5–15.5)
WBC: 13.5 10*3/uL — ABNORMAL HIGH (ref 4.0–10.5)
nRBC: 0 % (ref 0.0–0.2)

## 2021-01-06 LAB — RESP PANEL BY RT-PCR (FLU A&B, COVID) ARPGX2
Influenza A by PCR: NEGATIVE
Influenza B by PCR: NEGATIVE
SARS Coronavirus 2 by RT PCR: NEGATIVE

## 2021-01-06 LAB — URINALYSIS, ROUTINE W REFLEX MICROSCOPIC
Bilirubin Urine: NEGATIVE
Glucose, UA: NEGATIVE mg/dL
Ketones, ur: NEGATIVE mg/dL
Nitrite: NEGATIVE
Protein, ur: 30 mg/dL — AB
RBC / HPF: >50 RBC/hpf — ABNORMAL HIGH (ref 0–5)
Specific Gravity, Urine: 1.018 (ref 1.005–1.030)
pH: 5 (ref 5.0–8.0)

## 2021-01-06 LAB — PROTIME-INR
INR: 1.2 (ref 0.8–1.2)
Prothrombin Time: 15.1 seconds (ref 11.4–15.2)

## 2021-01-06 LAB — TYPE AND SCREEN
ABO/RH(D): A POS
Antibody Screen: NEGATIVE

## 2021-01-06 LAB — LACTIC ACID, PLASMA: Lactic Acid, Venous: 1.4 mmol/L (ref 0.5–1.9)

## 2021-01-06 MED ORDER — SODIUM CHLORIDE 0.9 % IV BOLUS: 1000.0000 mL | Freq: Once | INTRAVENOUS | Status: DC

## 2021-01-06 MED ORDER — MORPHINE SULFATE (PF) 2 MG/ML IV SOLN
2.0000 mg | Freq: Once | INTRAVENOUS | Status: AC
  Administered 2021-01-06: 2 mg via INTRAVENOUS
  Filled 2021-01-06: qty 1

## 2021-01-06 MED ORDER — ACETAMINOPHEN 650 MG RE SUPP
650.0000 mg | Freq: Once | RECTAL | Status: AC
  Administered 2021-01-06: 650 mg via RECTAL
  Filled 2021-01-06: qty 1

## 2021-01-06 MED ORDER — MORPHINE SULFATE (PF) 2 MG/ML IV SOLN
2.0000 mg | Freq: Once | INTRAVENOUS | Status: AC
Start: 2021-01-06 — End: 2021-01-06
  Administered 2021-01-06: 2 mg via INTRAVENOUS
  Filled 2021-01-06: qty 1

## 2021-01-06 MED ORDER — SODIUM CHLORIDE 0.9 % IV SOLN
1.0000 g | Freq: Once | INTRAVENOUS | Status: AC
  Administered 2021-01-06: 1 g via INTRAVENOUS
  Filled 2021-01-06: qty 10

## 2021-01-06 NOTE — Discharge Instructions (Signed)
Please prevent the patient from pulling on his catheter.  Keep foreskin pulled forward over the glans of the penis.

## 2021-01-06 NOTE — Progress Notes (Signed)
Location:   [facility] Room Number: 32 Place of Service:  SNF [phone]) Provider:  Veleta Miners MD  [name], MD  Patient Care Team: [name], MD as PCP - General (Internal Medicine) Monna Fam, MD as Consulting Physician (Ophthalmology)  Extended Emergency Contact Information Primary Emergency Contact: [name] Address: [address]          [place], [place] [address] Johnnette Litter of [place] Phone: [phone] Relation: Spouse Secondary Emergency Contact: [name] [place] of [employer] Phone: [phone] Relation: Daughter Interpreter needed? No  Code Status:  DNR Goals of care: Advanced Directive information Advanced Directives 12/17/2020  Does Patient Have a Medical Advance Directive? Yes  Type of Paramedic of [place];Living will  Does patient want to make changes to medical advance directive? -  Copy of [place] in Chart? No - copy requested     Chief Complaint  Patient presents with  . Acute Visit    Gross Hematuria and No Urine Output    HPI:  Pt is a 87 y.o. male seen today for an acute visit for Gross Hematuria and No Urine Output  SNF for [facility] Patient has a history of A. fib not on any anticoagulation due to hematuria   CHB s/p pacer, HLD, History of bilateral hand tremors. And progressive dementia Chronic Foley catheter s/p TAVR Was admitted in the hospital from2/18-2/23for altered mental status.  He has been having issues with Gross hematuria for past few days  Was diagnosed with UTI.  Is on nitrofurantoin.  The urine was positive for MRSA. Patient continues to pull on his Foley catheter and started having some Gross Blood from his Foley again yesterday.  The catheter was flushed.  But for past 12 hours he has not had any urine output.  His Foley bag just has Blood.  No fever..  Has not been eating.  Has been more  lethargic today. Unable to get much history but has not had any nausea vomiting or abdominal pain.  Past Medical History:  Diagnosis Date  . Arthritis    "right arm/shoulder" (02/10/2016)  . Complete heart block ([place]) 02/10/2016   a. s/p Emergency planning/management officer  . Goiter   . Hypercholesteremia   . Memory loss   . Nasal septal deviation   . Persistent atrial fibrillation ([place])   . Presence of permanent cardiac pacemaker   . 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 Severe aortic stenosis   . Tremor    Past Surgical History:  Procedure Laterality Date  . CATARACT EXTRACTION W/ INTRAOCULAR LENS  IMPLANT, BILATERAL Bilateral ~ 2000  . EP IMPLANTABLE DEVICE N/A 02/11/2016   Procedure: Pacemaker Implant;  Surgeon: Thalman Lance, MD;  Location: [facility];  Service: Cardiovascular;  Laterality: N/A;  . RIGHT/LEFT HEART CATH AND CORONARY ANGIOGRAPHY N/A 05/03/2018   Procedure: RIGHT/LEFT HEART CATH AND CORONARY ANGIOGRAPHY;  Surgeon: Burnell Blanks, MD;  Location: [facility];  Service: Cardiovascular;  Laterality: N/A;  . TEE WITHOUT CARDIOVERSION N/A 05/30/2018   Procedure: TRANSESOPHAGEAL ECHOCARDIOGRAM (TEE);  Surgeon: Burnell Blanks, MD;  Location: [facility];  Service: Open Heart Surgery;  Laterality: N/A;  . TONSILLECTOMY  1930s  . TRANSCATHETER AORTIC VALVE REPLACEMENT, TRANSFEMORAL N/A 05/30/2018   Procedure: TRANSCATHETER AORTIC VALVE REPLACEMENT, TRANSFEMORAL using  a 29mm Edwards Sapien 3 Aortic Valve;  Surgeon: Burnell Blanks, MD;  Location: [facility];  Service: Open Heart Surgery;  Laterality: N/A;  . WISDOM TOOTH EXTRACTION  1990s    No Known Allergies  Allergies as of 01/06/2021   No Known Allergies     Medication List       Accurate as of January 06, 2021 11:38 AM. If you have any questions, ask your nurse or doctor.         acetaminophen 500 MG tablet Commonly known as: TYLENOL Take 500-1,000 mg by mouth every 6 (six) hours as needed for mild pain, fever or headache.   acetaminophen 500 MG tablet Commonly known as: TYLENOL Take 1,000 mg by mouth daily.   ALPRAZolam 0.25 MG tablet Commonly known as: XANAX Take 0.25 mg by mouth 3 (three) times daily as needed for anxiety.   amLODipine 5 MG tablet Commonly known as: NORVASC Take 1 tablet by mouth daily.   clonazePAM 0.5 MG tablet Commonly known as: KLONOPIN Take 1 tablet (0.5 mg total) by mouth 2 (two) times daily.   ipratropium 0.03 % nasal spray Commonly known as: ATROVENT Place 2 sprays into both nostrils 4 (four) times daily as needed for rhinitis.   mirtazapine 15 MG tablet Commonly known as: REMERON Take 15 mg by mouth daily.   nitrofurantoin (macrocrystal-monohydrate) 100 MG capsule Commonly known as: MACROBID Take 100 mg by mouth 2 (two) times daily.   oxyCODONE-acetaminophen 5-325 MG tablet Commonly known as: Percocet Take 1-2 tablets by mouth every 6 (six) hours as needed. What changed: reasons to take this   polyvinyl alcohol 1.4 % ophthalmic solution Commonly known as: LIQUIFILM TEARS Place 1 drop into both eyes as needed for dry eyes.   QUEtiapine 100 MG tablet Commonly known as: SEROQUEL Take 100 mg by mouth at bedtime. What changed: Another medication with the same name was removed. Continue taking this medication, and follow the directions you see here. Changed by: Virgie Dad, MD   saccharomyces boulardii 250 MG capsule Commonly known as: FLORASTOR Take 250 mg by mouth 2 (two) times daily.   tamsulosin 0.4 MG Caps capsule Commonly known as: FLOMAX Take 1 capsule (0.4 mg total) by mouth daily.       Review of Systems  Unable to perform ROS: Dementia    Immunization History  Administered Date(s) Administered  . Influenza, High Dose Seasonal PF 08/01/2014, 06/16/2015, 06/17/2016  . Influenza,  Quadrivalent, Recombinant, Inj, Pf 07/26/2017, 07/21/2018, 06/08/2019, 07/02/2020  . Influenza-Unspecified 07/20/2013  . Pneumococcal Conjugate-13 01/16/2013  . Pneumococcal Polysaccharide-23 01/07/1999  . Td 11/13/2018  . Tdap 01/11/2012  . Zoster 09/07/2006  . Zoster Recombinat (Shingrix) 02/27/2019, 02/27/2019   Pertinent  Health Maintenance Due  Topic Date Due  . INFLUENZA VACCINE  05/04/2021  . PNA vac Low Risk Adult  Completed   Fall Risk  03/06/2019  Falls in the past year? 1  Number falls in past yr: 1  Injury with Fall? 0   Functional Status Survey:    Vitals:   01/06/21 1124  BP: 120/60  Pulse: 60  Resp: 20  Temp: (!) 97.4 F (36.3 C)  SpO2: 98%  Weight: 153 lb 1.6 oz (69.4 kg)  Height: 5\' 10"  (1.778 m)   Body mass index is 21.97 kg/m. Physical Exam  Constitutional: O. Well-developed and well-nourished.  HENT:  Head: Normocephalic.  Mouth/Throat: Oropharynx is clear and moist.  Eyes: Pupils are equal, round, and reactive to light.  Neck:  Neck supple.  Cardiovascular: Normal rate and normal heart sounds.  No murmur heard. Pulmonary/Chest: Effort normal and breath sounds normal. No respiratory distress. No wheezes. She has no rales.  Abdominal: Soft. Bowel sounds are normal. No distension. There is no tenderness. There is no rebound. No Abdominal distension Penis is swollen with some Blood Leaking around the Foley No Urine output since yesterday Musculoskeletal: No edema.  Lymphadenopathy: none Neurological: More Lethargic today  Skin: Skin is warm and dry.  Psychiatric: Normal mood and affect. Behavior is normal. Thought content normal.    Labs reviewed: Recent Labs    11/20/20 1123 11/21/20 1034 11/22/20 0514 12/23/20 0000  NA 135 134* 136 141  K 4.7 4.3 4.5 3.9  CL 98 100 103 105  CO2 18* 23 24 22   GLUCOSE 83 71 84  --   BUN 23 24* 19 23*  CREATININE 1.26 1.24 1.06 1.0  CALCIUM 9.5 9.3 8.8* 8.9   Recent Labs    11/20/20 1123  11/21/20 1034 11/22/20 0514 12/23/20 0000  AST 52* 60* 55* 72*  ALT 40 48* 40 39  ALKPHOS 133* 105 83 101  BILITOT 0.6 0.9 0.9  --   PROT 6.9 7.5 6.2*  --   ALBUMIN 4.3 4.2 3.4* 3.5   Recent Labs    11/20/20 1123 11/21/20 1034 11/22/20 0514 12/23/20 0000  WBC 9.3 8.0 7.7 16.3  NEUTROABS 7.0 6.1 5.6 14,116.00  HGB 14.9 15.7 13.3 12.7*  HCT 43.5 48.4 40.7 39*  MCV 92 95.3 95.3  --   PLT 168 151 140* 188   Lab Results  Component Value Date   TSH 2.277 11/21/2020   Lab Results  Component Value Date   HGBA1C 5.8 (H) 11/22/2020   Lab Results  Component Value Date   CHOL 127 11/22/2020   HDL 52 11/22/2020   LDLCALC 65 11/22/2020   TRIG 50 11/22/2020   CHOLHDL 2.4 11/22/2020    Significant Diagnostic Results in last 30 days:  CUP PACEART REMOTE DEVICE CHECK  Result Date: 12/12/2020 Scheduled remote reviewed. Normal device function.  1 VHR event logged 12 seconds w/ rate 170's bpm; no EGM available for review. Next remote 91 days. HB   Assessment/Plan Patient with Gross hematuria Decreased urine output. UTI Discussed with his wife will send to ED for Further eval   Family/ staff Communication:   Labs/tests ordered:

## 2021-01-06 NOTE — Assessment & Plan Note (Signed)
Vascular dementia with behavioral disturbance, underwent neurology eval, takesAlprazolam. Palliative care, may progress to Hospice. No heroic measures. Vit B12 2359 11/22/20. Takes Quetiapine, added Klonopin 0.5mg  bid 12/26/20, takes prn Alprazolam,  off Memantine/Donepezil.also takes Mirtazapine 15 mg since 12/22/20

## 2021-01-06 NOTE — ED Provider Notes (Addendum)
[facility] EMERGENCY DEPARTMENT Provider Note   CSN: [id number] Arrival date & time: 01/06/21  1527     History Chief Complaint  Patient presents with  . Altered Mental Status  . Foley Catheter Problem    Jeffery Rogers is a 87 y.o. male.  87 year old male with prior medical history detailed below presents for evaluation.  Patient with advanced dementia.  Patient with valid DNR.  Patient is accompanied by his daughter.  Patient may have pulled on his chronic Foley catheter over the last 24 hours.  Patient noted to have decreased urine output.  Patient with bloody drainage into his catheter.  Patient is currently on nitrofurantoin for suspected UTI.  Patient's mental status is at baseline.  The history is provided by the patient, a relative and medical records.  Illness Location:  Bloody drainage into foley catheter, decreased Urine output Severity:  Mild Onset quality:  Gradual Duration:  1 day Timing:  Constant Progression:  Waxing and waning Chronicity:  New      Past Medical History:  Diagnosis Date  . Arthritis    "right arm/shoulder" (02/10/2016)  . Complete heart block ([place]) 02/10/2016   a. s/p Emergency planning/management officer  . Goiter   . Hypercholesteremia   . Memory loss   . Nasal septal deviation   . Persistent atrial fibrillation ([place])   . Presence of permanent cardiac pacemaker   . 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 Severe aortic stenosis   . Tremor     Patient Active Problem List   Diagnosis Date Noted  . UTI (urinary tract infection) due to urinary indwelling Foley catheter ([place]) 01/05/2021  . Leukocytosis 12/24/2020  . Prediabetes 12/17/2020  . Dysphagia 11/26/2020  . Lumbar spinal stenosis 11/26/2020  . Gait abnormality 11/26/2020  . HTN (hypertension) 11/26/2020  . Adult failure to thrive 11/26/2020  .  Osteoarthritis, generalized 11/26/2020  . Generalized weakness   . Blood in urine 11/22/2020  . Acute lower UTI 11/22/2020  . Chronic indwelling Foley catheter 11/22/2020  . AMS (altered mental status) 11/21/2020  . Confusion 11/20/2020  . Late onset Alzheimer's disease with behavioral disturbance ([place]) 11/19/2019  . Anxiety due to dementia ([place])   . Fall 03/07/2019  . Noise effect on both inner ears 10/06/2018  . 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 Presence of permanent cardiac pacemaker   . Persistent atrial fibrillation ([place])   . S/P TAVR (transcatheter aortic valve replacement) 05/30/2018  . Severe aortic stenosis   . Tendinopathy of left rotator cuff 12/19/2017  . Bilateral impacted cerumen 11/25/2017  . Presbycusis of both ears 11/25/2017  . Vasomotor rhinitis 11/25/2017  . Dizziness 05/15/2017  . Chronic right maxillary sinusitis 03/30/2017  . Rhinitis 03/30/2017  . Elevated CPK 02/18/2017  . Sinusitis 02/18/2017  . Peripheral neuropathy 02/18/2017  . Chronic maxillary sinusitis 02/18/2017  . Essential tremor 01/12/2017  . Orthostatic dizziness 01/12/2017  . Diplopia 01/12/2017  . First degree atrioventricular block 03/29/2013  . Hypercholesteremia   . Goiter     Past Surgical History:  Procedure Laterality Date  . CATARACT EXTRACTION W/ INTRAOCULAR LENS  IMPLANT, BILATERAL Bilateral ~ 2000  . EP IMPLANTABLE DEVICE N/A 02/11/2016   Procedure: Pacemaker Implant;  Surgeon: Wisz Lance, MD;  Location: [facility];  Service: Cardiovascular;  Laterality: N/A;  . RIGHT/LEFT  HEART CATH AND CORONARY ANGIOGRAPHY N/A 05/03/2018   Procedure: RIGHT/LEFT HEART CATH AND CORONARY ANGIOGRAPHY;  Surgeon: Burnell Blanks, MD;  Location: [facility];  Service: Cardiovascular;  Laterality: N/A;  . TEE WITHOUT CARDIOVERSION N/A 05/30/2018   Procedure: TRANSESOPHAGEAL ECHOCARDIOGRAM (TEE);  Surgeon: Burnell Blanks,  MD;  Location: [facility];  Service: Open Heart Surgery;  Laterality: N/A;  . TONSILLECTOMY  1930s  . TRANSCATHETER AORTIC VALVE REPLACEMENT, TRANSFEMORAL N/A 05/30/2018   Procedure: TRANSCATHETER AORTIC VALVE REPLACEMENT, TRANSFEMORAL using a 31mm Edwards Sapien 3 Aortic Valve;  Surgeon: Burnell Blanks, MD;  Location: [facility];  Service: Open Heart Surgery;  Laterality: N/A;  . WISDOM TOOTH EXTRACTION  1990s       Family History  Problem Relation Age of Onset  . Stroke Other   . Irregular heart beat Other   . Stroke Mother   . Stroke Father   . CAD Father        MI    Social History   Tobacco Use  . Smoking status: Former Smoker    Packs/day: 1.00    Years: 16.00    Pack years: 16.00    Types: Cigarettes  . Smokeless tobacco: Never Used  . Tobacco comment: Quit smoking cigarettes in 1971  Vaping Use  . Vaping Use: Never used  Substance Use Topics  . Alcohol use: Never    Alcohol/week: 2.0 standard drinks    Types: 1 Cans of beer, 1 Shots of liquor per week  . Drug use: No    Home Medications Prior to Admission medications   Medication Sig Start Date End Date Taking? Authorizing Provider  acetaminophen (TYLENOL) 500 MG tablet Take 500-1,000 mg by mouth every 6 (six) hours as needed for mild pain, fever or headache.    [provider]  acetaminophen (TYLENOL) 500 MG tablet Take 1,000 mg by mouth daily.    [provider]  ALPRAZolam Duanne Moron) 0.25 MG tablet Take 0.25 mg by mouth 3 (three) times daily as needed for anxiety.    [provider]  amLODipine (NORVASC) 5 MG tablet Take 1 tablet by mouth daily. 04/15/20   Eileen Stanford, PA-C  clonazePAM (KLONOPIN) 0.5 MG tablet Take 1 tablet (0.5 mg total) by mouth 2 (two) times daily. 12/26/20   Virgie Dad, MD  ipratropium (ATROVENT) 0.03 % nasal spray Place 2 sprays into both nostrils 4 (four) times daily as needed for rhinitis.    [provider]  mirtazapine (REMERON) 15 MG tablet  Take 15 mg by mouth daily. 02/19/20   [provider]  nitrofurantoin, macrocrystal-monohydrate, (MACROBID) 100 MG capsule Take 100 mg by mouth 2 (two) times daily. 01/06/21 01/11/21  [provider]  oxyCODONE-acetaminophen (PERCOCET) 5-325 MG tablet Take 1-2 tablets by mouth every 6 (six) hours as needed. Patient taking differently: Take 1-2 tablets by mouth every 6 (six) hours as needed for moderate pain. 11/13/20   Charlesetta Shanks, MD  polyvinyl alcohol (LIQUIFILM TEARS) 1.4 % ophthalmic solution Place 1 drop into both eyes as needed for dry eyes.    [provider]  QUEtiapine (SEROQUEL) 100 MG tablet Take 100 mg by mouth at bedtime.    [provider]  saccharomyces boulardii (FLORASTOR) 250 MG capsule Take 250 mg by mouth 2 (two) times daily. 01/03/21 01/12/21  [provider]  tamsulosin (FLOMAX) 0.4 MG CAPS capsule Take 1 capsule (0.4 mg total) by mouth daily. 12/17/20   Mast, Man X, NP  Allergies    Patient has no known allergies.  Review of Systems   Review of Systems  All other systems reviewed and are negative.   Physical Exam Updated Vital Signs BP (!) 140/59   Pulse (!) 59   Temp 97.9 F (36.6 C) (Oral)   Resp (!) 23   Ht 6\' 1"  (1.854 m)   Wt 72.6 kg   SpO2 91%   BMI 21.11 kg/m   Physical Exam Vitals and nursing note reviewed.  Constitutional:      General: He is not in acute distress.    Appearance: He is well-developed.  HENT:     Head: Normocephalic and atraumatic.  Eyes:     Conjunctiva/sclera: Conjunctivae normal.     Pupils: Pupils are equal, round, and reactive to light.  Cardiovascular:     Rate and Rhythm: Normal rate and regular rhythm.     Heart sounds: Normal heart sounds.  Pulmonary:     Effort: Pulmonary effort is normal. No respiratory distress.     Breath sounds: Normal breath sounds.  Abdominal:     General: There is no distension.     Palpations: Abdomen is soft.     Tenderness: There is no  abdominal tenderness.  Genitourinary:    Comments: Foley cath with scant bloody drainage noted  Musculoskeletal:        General: No deformity. Normal range of motion.     Cervical back: Normal range of motion and neck supple.  Skin:    General: Skin is warm and dry.  Neurological:     General: No focal deficit present.     Mental Status: He is alert. Mental status is at baseline. He is disoriented.     ED Results / Procedures / Treatments   Labs (all labs ordered are listed, but only abnormal results are displayed) Labs Reviewed  COMPREHENSIVE METABOLIC PANEL - Abnormal; Notable for the following components:      Result Value   Glucose, Bld 109 (*)    BUN 25 (*)    Albumin 2.9 (*)    All other components within normal limits  CBC WITH DIFFERENTIAL/PLATELET - Abnormal; Notable for the following components:   WBC 13.5 (*)    RBC 3.49 (*)    Hemoglobin 10.7 (*)    HCT 33.5 (*)    Neutro Abs 11.7 (*)    Abs Immature Granulocytes 0.08 (*)    All other components within normal limits  URINE CULTURE  PROTIME-INR  URINALYSIS, ROUTINE W REFLEX MICROSCOPIC  TYPE AND SCREEN    EKG EKG Interpretation  Date/Time:  Tuesday January 06 2021 18:07:45 EDT Ventricular Rate:  60 PR Interval:  64 QRS Duration: 157 QT Interval:  451 QTC Calculation: 451 R Axis:   -84 Text Interpretation: Sinus rhythm Multiform ventricular premature complexes Short PR interval Probable left atrial enlargement Nonspecific IVCD with LAD Left ventricular hypertrophy Confirmed by Dene Gentry [phone]) on 01/06/2021 6:30:32 PM   Radiology No results found.  Procedures Procedures   Medications Ordered in ED Medications  morphine 2 MG/ML injection 2 mg (2 mg Intravenous Given 01/06/21 1603)    ED Course  I have reviewed the triage vital signs and the nursing notes.  Pertinent labs & imaging results that were available during my care of the patient were reviewed by me and considered in my medical  decision making (see chart for details).    MDM Rules/Calculators/A&P  MDM  MSE complete  DEHAVEN SINE was evaluated in Emergency Department on 01/06/2021 for the symptoms described in the history of present illness. He was evaluated in the context of the global COVID-19 pandemic, which necessitated consideration that the patient might be at risk for infection with the SARS-CoV-2 virus that causes COVID-19. Institutional protocols and algorithms that pertain to the evaluation of patients at risk for COVID-19 are in a state of rapid change based on information released by regulatory bodies including the CDC and federal and state organizations. These policies and algorithms were followed during the patient's care in the ED.  Patient presents for urinary retention with associated scant bleeding from tip of penis after traumatic attempted removal by the patient.  Catheter was removed.  Bladder scans demonstrated 300 cc of urine.  Catheter was not able to be replaced.  Urology consulted.  Foley placed in the bladder with good output noted.  Patient is already on day 2 of PO nitrofurantoin for treatment of suspected UTI.  Repeat UA and urine culture obtained.  Patient noted to be febrile. Blood Cx and Lactic acid ordered. IV Rocephin ordered.  Goals of care discussed with the patient's daughter.  Given systemic response to likely UTI will plan for IV antibiotics and admission.  Hospitalist service is aware of case and will evaluate.    Final Clinical Impression(s) / ED Diagnoses Final diagnoses:  Problem with Foley catheter, initial encounter [facility])  Urinary retention    Rx / DC Orders ED Discharge Orders    None       Valarie Merino, MD 01/06/21 2007    Valarie Merino, MD 01/06/21 2023

## 2021-01-06 NOTE — H&P (Signed)
Jeffery Rogers:[id number] DOB: 10-Jul-1932 DOA: 01/06/2021     PCP: [name], MD   Outpatient Specialists:      Patient arrived to ER on 01/06/21 at 1527 Referred by Attending Valarie Merino, MD   Patient coming from:  From facility    friends from [place]:   Chief Complaint  Patient presents with  . Altered Mental Status  . Foley Catheter Problem    HPI: Jeffery Rogers is a 87 y.o. male with medical history significant of dementia, complete heart block status post pacemaker, HLD, persistent atrial fibrillation,, status post TAVR secondary to severe aortic stenosis    Presented with    Pulled out his foley catheter with some bleeding after that had decreased urine output try to replace Foley but could not.  He continued to have bloody drainage. Recently treated with nitrofurantoin for UTI.  Patient has significant dementia at baseline unable to provide history       Has  been vaccinated against COVID    Initial COVID TEST  NEGATIVE    Lab Results  Component Value Date   Jeffery Rogers 01/06/2021   [place] NEGATIVE 11/25/2020   [place] NEGATIVE 11/21/2020   [place] NEGATIVE 04/14/2019     Regarding pertinent Chronic problems:       HTN on Norvasc     A. Fib -  - CHA2DS2 vas score    3     Not on anticoagulation secondary to Risk of Falls,   recurrent bleeding         -  Rate controled   BPH - on Flomax,        Dementia - on Seroquel   While in ER: Noted to have elevated white blood cell count 13.5 and worsening anemia down to 10.7 40 likely were not functioning properly and was removed bladder scan showed 300 cc of urine attempted to replace catheter was unable Urology was consulted seen patient and ER and replaced the Foley Patient noted to be febrile Blood cultures and lactic acid were ordered started on IV Rocephin    ED Triage Vitals  Enc Vitals Group     BP 01/06/21 1549 140/65     Pulse Rate  01/06/21 1549 62     Resp 01/06/21 1630 (!) 23     Temp 01/06/21 1549 97.9 F (36.6 C)     Temp Source 01/06/21 1549 Oral     SpO2 01/06/21 1549 100 %     Weight 01/06/21 1558 160 lb (72.6 kg)     Height 01/06/21 1558 6' 1"  (1.854 m)     Head Circumference --      Peak Flow --      Pain Score --      Pain Loc --      Pain Edu? --      Excl. in [place]? --   TMAX(24)@     _________________________________________ Significant initial  Findings: Abnormal Labs Reviewed  URINALYSIS, ROUTINE W REFLEX MICROSCOPIC - Abnormal; Notable for the following components:      Result Value   Color, Urine AMBER (*)    APPearance HAZY (*)    Hgb urine dipstick LARGE (*)    Protein, ur 30 (*)    Leukocytes,Ua SMALL (*)    RBC / HPF >50 (*)    Bacteria, UA FEW (*)    All other components within normal limits  COMPREHENSIVE METABOLIC PANEL - Abnormal; Notable for the following  components:   Glucose, Bld 109 (*)    BUN 25 (*)    Albumin 2.9 (*)    All other components within normal limits  CBC WITH DIFFERENTIAL/PLATELET - Abnormal; Notable for the following components:   WBC 13.5 (*)    RBC 3.49 (*)    Hemoglobin 10.7 (*)    HCT 33.5 (*)    Neutro Abs 11.7 (*)    Abs Immature Granulocytes 0.08 (*)    All other components within normal limits   ____________________________________________ Ordered  ___________________   ECG: Ordered Personally reviewed by me showing: HR : 60 Rhythm:   Paced   QTC 451 ____________________ This patient meets SIRS Criteria     The recent clinical data is shown below. Vitals:   01/06/21 1745 01/06/21 1900 01/06/21 1945 01/06/21 2000  BP: (!) 125/106  115/71 105/82  Pulse: 83 65 (!) 59 60  Resp: 14 (!) 32 (!) 30 16  Temp:    (!) 102.1 F (38.9 C)  TempSrc:    Rectal  SpO2: (!) 74% 99% 98% 99%  Weight:      Height:         WBC     Component Value Date/Time   WBC 13.5 (H) 01/06/2021 1624   LYMPHSABS 0.7 01/06/2021 1624   LYMPHSABS 1.3 11/20/2020  1123   MONOABS 0.9 01/06/2021 1624   EOSABS 0.0 01/06/2021 1624   EOSABS 0.1 11/20/2020 1123   BASOSABS 0.1 01/06/2021 1624   BASOSABS 0.1 11/20/2020 1123        Lactic Acid, Venous    Component Value Date/Time   LATICACIDVEN 1.4 11/21/2020 1038       UA  evidence of UTI     Urine analysis:    Component Value Date/Time   COLORURINE AMBER (A) 01/06/2021 1940   APPEARANCEUR HAZY (A) 01/06/2021 1940   LABSPEC 1.018 01/06/2021 1940   PHURINE 5.0 01/06/2021 1940   GLUCOSEU NEGATIVE 01/06/2021 1940   HGBUR LARGE (A) 01/06/2021 1940   BILIRUBINUR NEGATIVE 01/06/2021 [place] NEGATIVE 01/06/2021 1940   PROTEINUR 30 (A) 01/06/2021 1940   NITRITE NEGATIVE 01/06/2021 1940   LEUKOCYTESUR SMALL (A) 01/06/2021 1940    Results for orders placed or performed during the hospital encounter of 01/06/21  Resp Panel by RT-PCR (Flu A&B, Covid) Urine, Catheterized     Status: None   Collection Time: 01/06/21  9:18 PM   Specimen: Urine, Catheterized; Nasopharyngeal(NP) swabs in vial transport medium  Result Value Ref Range Status   SARS Coronavirus 2 by RT PCR NEGATIVE NEGATIVE Final         Influenza A by PCR NEGATIVE NEGATIVE Final   Influenza B by PCR NEGATIVE NEGATIVE Final          _______________________________________________________ ER Provider Called: Urology     Dr.Bell  They Recommend admit to medicine    SEEN in ER _______________________________________________ Hospitalist was called for admission for  UTI, sepsis, urinary retention  The following Work up has been ordered so far:  Orders Placed This Encounter  Procedures  . Urine culture  . Culture, blood (routine x 2)  . Urinalysis, Routine w reflex microscopic  . Comprehensive metabolic panel  . CBC with Differential  . Protime-INR  . Lactic acid, plasma  . Bladder scan  . Insert foley catheter  . Do not attempt resuscitation/DNR  . Consult to urology  Unable to Cath  . Consult to hospitalist  UTI,  Fever, Urinary retention  . EKG  12-Lead  . EKG 12-Lead  . Type and screen [place]  . Saline lock IV    Following Medications were ordered in ER: Medications  cefTRIAXone (ROCEPHIN) 1 g in sodium chloride 0.9 % 100 mL IVPB (1 g Intravenous New Bag/Given 01/06/21 2042)  morphine 2 MG/ML injection 2 mg (2 mg Intravenous Given 01/06/21 1603)  morphine 2 MG/ML injection 2 mg (2 mg Intravenous Given 01/06/21 2007)  acetaminophen (TYLENOL) suppository 650 mg (650 mg Rectal Given 01/06/21 2042)        Consult Orders  (From admission, onward)         Start     Ordered   01/06/21 2020  Consult to hospitalist  UTI, Fever, Urinary retention, paged by Kennyth Lose  Once       Comments: UTI, Fever, Urinary retention  Provider:  (Not yet assigned)  Question Answer Comment  Place call to: [facility]   Reason for Consult Admit      01/06/21 2020            OTHER Significant initial  Findings:  labs showing:    Recent Labs  Lab 01/06/21 1624  NA 141  K 4.4  CO2 25  GLUCOSE 109*  BUN 25*  CREATININE 1.09  CALCIUM 8.9    Cr   stable,    Lab Results  Component Value Date   CREATININE 1.09 01/06/2021   CREATININE 1.0 12/23/2020   CREATININE 1.06 11/22/2020    Recent Labs  Lab 01/06/21 1624  AST 41  ALT 35  ALKPHOS 100  BILITOT 0.7  PROT 6.8  ALBUMIN 2.9*   Lab Results  Component Value Date   CALCIUM 8.9 01/06/2021          Plt: Lab Results  Component Value Date   PLT 271 01/06/2021       Recent Labs  Lab 01/06/21 1624  WBC 13.5*  NEUTROABS 11.7*  HGB 10.7*  HCT 33.5*  MCV 96.0  PLT 271    HG/HCT stable,       Component Value Date/Time   HGB 10.7 (L) 01/06/2021 1624   HGB 14.9 11/20/2020 1123   HCT 33.5 (L) 01/06/2021 1624   HCT 43.5 11/20/2020 1123   MCV 96.0 01/06/2021 1624   MCV 92 11/20/2020 1123      No results for input(s): LIPASE, AMYLASE in the last 168 hours. No results for input(s): AMMONIA in the last 168  hours.   Cardiac Panel (last 3 results) No results for input(s): CKTOTAL, CKMB, TROPONINI, RELINDX in the last 72 hours.   BNP (last 3 results) No results for input(s): BNP in the last 8760 hours.    DM  labs:  HbA1C: Recent Labs    11/22/20 0514  HGBA1C 5.8*       CBG (last 3)  No results for input(s): GLUCAP in the last 72 hours.        Cultures:    Component Value Date/Time   SDES  11/22/2020 0740    URINE, RANDOM Performed at [facility], [place] [address], [place], [place] [address]    SPECREQUEST  11/22/2020 0740    NONE Performed at [facility], [place] [address] [place], [place] [address]    CULT  11/22/2020 0740    NO GROWTH Performed at [facility] [address] [place], [place] [address]    REPTSTATUS 11/23/2020 FINAL 11/22/2020 0740     Radiological Exams on Admission: No  results found. _______________________________________________________________________________________________________ Latest  Blood pressure 105/82, pulse 60, temperature (!) 102.1 F (38.9 C), temperature source Rectal, resp. rate 16, height $RemoveBe'6\' 1"'[id number]$  (1.854 m), weight 72.6 kg, SpO2 99 %.   Review of Systems:    Pertinent positives include: aggitation Fevers, chills, fatigue,  Constitutional:  No weight loss, night sweats weight loss  HEENT:  No headaches, Difficulty swallowing,Tooth/dental problems,Sore throat,  No sneezing, itching, ear ache, nasal congestion, post nasal drip,  Cardio-vascular:  No chest pain, Orthopnea, PND, anasarca, dizziness, palpitations.no Bilateral lower extremity swelling  GI:  No heartburn, indigestion, abdominal pain, nausea, vomiting, diarrhea, change in bowel habits, loss of appetite, melena, blood in stool, hematemesis Resp:  no shortness of breath at rest. No dyspnea on exertion, No excess mucus, no productive cough, No non-productive cough, No coughing up of blood.No change in color of mucus.No  wheezing. Skin:  no rash or lesions. No jaundice GU:  no dysuria, change in color of urine, no urgency or frequency. No straining to urinate.  No flank pain.  Musculoskeletal:  No joint pain or no joint swelling. No decreased range of motion. No back pain.  Psych:  No change in mood or affect. No depression or anxiety. No memory loss.  Neuro: no localizing neurological complaints, no tingling, no weakness, no double vision, no gait abnormality, no slurred speech, no confusion  All systems reviewed and apart from [place] all are negative _______________________________________________________________________________________________ Past Medical History:   Past Medical History:  Diagnosis Date  . Arthritis    "right arm/shoulder" (02/10/2016)  . Complete heart block ([place]) 02/10/2016   a. s/p Emergency planning/management officer  . Goiter   . Hypercholesteremia   . Memory loss   . Nasal septal deviation   . Persistent atrial fibrillation ([place])   . Presence of permanent cardiac pacemaker   . 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 Severe aortic stenosis   . Tremor       Past Surgical History:  Procedure Laterality Date  . CATARACT EXTRACTION W/ INTRAOCULAR LENS  IMPLANT, BILATERAL Bilateral ~ 2000  . EP IMPLANTABLE DEVICE N/A 02/11/2016   Procedure: Pacemaker Implant;  Surgeon: Sandate Lance, MD;  Location: [facility];  Service: Cardiovascular;  Laterality: N/A;  . RIGHT/LEFT HEART CATH AND CORONARY ANGIOGRAPHY N/A 05/03/2018   Procedure: RIGHT/LEFT HEART CATH AND CORONARY ANGIOGRAPHY;  Surgeon: Burnell Blanks, MD;  Location: [facility];  Service: Cardiovascular;  Laterality: N/A;  . TEE WITHOUT CARDIOVERSION N/A 05/30/2018   Procedure: TRANSESOPHAGEAL ECHOCARDIOGRAM (TEE);  Surgeon: Burnell Blanks, MD;  Location: [facility];  Service: Open Heart  Surgery;  Laterality: N/A;  . TONSILLECTOMY  1930s  . TRANSCATHETER AORTIC VALVE REPLACEMENT, TRANSFEMORAL N/A 05/30/2018   Procedure: TRANSCATHETER AORTIC VALVE REPLACEMENT, TRANSFEMORAL using a 38mm Edwards Sapien 3 Aortic Valve;  Surgeon: Burnell Blanks, MD;  Location: [facility];  Service: Open Heart Surgery;  Laterality: N/A;  . WISDOM TOOTH EXTRACTION  1990s    Social History:  Ambulatory   walker       reports that he has quit smoking. His smoking use included cigarettes. He has a 16.00 pack-year smoking history. He has never used smokeless tobacco. He reports that he does not drink alcohol and does not use drugs.     Family History:   Family History  Problem Relation Age of Onset  . Stroke  Other   . Irregular heart beat Other   . Stroke Mother   . Stroke Father   . CAD Father        MI   ______________________________________________________________________________________________ Allergies: No Known Allergies   Prior to Admission medications   Medication Sig Start Date End Date Taking? Authorizing Provider  acetaminophen (TYLENOL) 500 MG tablet Take 1,000 mg by mouth daily.   Yes [provider]  ALPRAZolam (XANAX) 0.25 MG tablet Take 0.25 mg by mouth 3 (three) times daily as needed for anxiety.   Yes [provider]  amLODipine (NORVASC) 5 MG tablet Take 1 tablet by mouth daily. Patient taking differently: Take 5 mg by mouth daily. 04/15/20  Yes Eileen Stanford, PA-C  clonazePAM (KLONOPIN) 0.5 MG tablet Take 1 tablet (0.5 mg total) by mouth 2 (two) times daily. 12/26/20  Yes Virgie Dad, MD  ipratropium (ATROVENT) 0.03 % nasal spray Place 2 sprays into both nostrils 4 (four) times daily as needed for rhinitis.   Yes [provider]  mirtazapine (REMERON) 15 MG tablet Take 15 mg by mouth daily. 02/19/20  Yes [provider]  nitrofurantoin, macrocrystal-monohydrate, (MACROBID) 100 MG capsule Take 100 mg by mouth 2 (two)  times daily. 01/06/21 01/11/21 Yes [provider]  oxyCODONE-acetaminophen (PERCOCET) 5-325 MG tablet Take 1-2 tablets by mouth every 6 (six) hours as needed. Patient taking differently: Take 1-2 tablets by mouth every 6 (six) hours as needed for moderate pain. 11/13/20  Yes Charlesetta Shanks, MD  polyvinyl alcohol (LIQUIFILM TEARS) 1.4 % ophthalmic solution Place 1 drop into both eyes 4 (four) times daily as needed for dry eyes.   Yes [provider]  QUEtiapine (SEROQUEL) 100 MG tablet Take 100 mg by mouth at bedtime.   Yes [provider]  saccharomyces boulardii (FLORASTOR) 250 MG capsule Take 250 mg by mouth 2 (two) times daily.   Yes [provider]  tamsulosin (FLOMAX) 0.4 MG CAPS capsule Take 1 capsule (0.4 mg total) by mouth daily. 12/17/20  Yes Mast, Man X, NP    ___________________________________________________________________________________________________ Physical Exam: Vitals with BMI 01/06/2021 01/06/2021 01/06/2021  Height - - -  Weight - - -  BMI - - -  Systolic 413 244 -  Diastolic 82 71 -  Pulse 60 59 65   1. General:  in No Acute distress Chronically ill -appearing 2. Psychological: Alert and Oriented to self only 3. Head/ENT:   Dry Mucous Membranes                          Head Non traumatic, neck supple                          Poor Dentition 4. SKIN: decreased Skin turgor,  Skin clean Dry and intact no rash 5. Heart: Regular rate and rhythm no  Murmur, no Rub or gallop 6. Lungs: no wheezes or crackles   7. Abdomen: Soft,   Non distended  bowel sounds present, foley in place with bloody urine 8. Lower extremities: no clubbing, cyanosis, no edema 9. Neurologically Grossly intact, moving all 4 extremities equally 10. MSK: Normal range of motion    Chart has been reviewed  ______________________________________________________________________________________________  Assessment/Plan    87 y.o. male with medical history significant  of dementia, complete heart block status post pacemaker, HLD, persistent atrial fibrillation,, status post TAVR secondary to severe aortic stenosis     Admitted  for UTI, sepsis, hematuria   Present on Admission: . UTI (urinary tract infection) due to urinary indwelling Foley catheter ([place]) -  - treat with Rocephin    . Persistent atrial fibrillation (HCC) currently stable not on anticoagulation status post pacemaker  . 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 HTN (hypertension) allow permissive hypertension for tonight given some soft blood pressures  Sepsis -   criteria met with  elevated white blood cell count,       Component Value Date/Time   WBC 13.5 (H) 01/06/2021 1624   LYMPHSABS 0.7 01/06/2021 1624   LYMPHSABS 1.3 11/20/2020 1123    fever   RR >20 Today's Vitals   01/06/21 1945 01/06/21 2000 01/06/21 2230 01/06/21 2315  BP: 115/71 105/82 110/80 (!) 119/91  Pulse: (!) 59 60 65 66  Resp: (!) 30 16 18 18   Temp:  (!) 102.1 F (38.9 C)    TempSrc:  Rectal    SpO2: 98% 99% 99% 100%  Weight:      Height:       This patient meets SIRS Criteria and may be septic.   The recent clinical data is shown below. Vitals:   01/06/21 1945 01/06/21 2000 01/06/21 2230 01/06/21 2315  BP: 115/71 105/82 110/80 (!) 119/91  Pulse: (!) 59 60 65 66  Resp: (!) 30 16 18 18   Temp:  (!) 102.1 F (38.9 C)    TempSrc:  Rectal    SpO2: 98% 99% 99% 100%  Weight:      Height:          -Most likely source being: urinary,    Patient meeting criteria for Severe sepsis with   acute metabolic encephalopathy       - Obtain serial lactic acid and procalcitonin level.  - Initiated IV antibiotics  Rocephin 4/5>>  - await results of blood and urine culture  - Rehydrate aggressively        11:31 PM     Other plan as per orders.  DVT prophylaxis:  SCD    Code Status:    Code Status: DNR   DNR/DNI has paper work     She is Family Communication:   Family not at  Bedside    Disposition Plan:                             Back to current facility when stable                          Following barriers for discharge:                            Electrolytes corrected                                                             Pain controlled with PO medications                               Afebrile, white count improving able to transition to PO antibiotics  Will need to be able to tolerate PO                            Will likely need home health, home O2, set up                                          Would benefit from PT/OT eval prior to DC  Ordered                   Swallow eval - SLP ordered                                      Transition of care consulted                   Nutrition    consulted                                   Palliative care    consulted                                      Consults called: Urology is awre    Admission status:  ED Disposition    ED Disposition Condition [place]: [place] [[id number]]  Level of Care: Telemetry Medical [104]  I expect the patient will be discharged within 24 hours: No (not a candidate for 5C-Observation unit)  Covid Evaluation: Confirmed COVID Negative  Diagnosis: Sepsis [facility]) [[id number]]  Admitting Physician: Toy Baker [[id number]]  Attending Physician: Toy Baker [[id number]]       Obs     Level of care     tele  For 12H     Lab Results  Component Value Date   [place] 01/06/2021     Precautions: admitted as Covid Negative      PPE: Used by the provider:   N95   eye Goggles,  Gloves     Damari Hiltz 01/06/2021, 11:33 PM    [facility]     after 2 AM please page floor coverage PA If 7AM-7PM, please contact the day team taking care of the patient using [email or web address]   Patient was evaluated in the context of the global  COVID-19 pandemic, which necessitated consideration that the patient might be at risk for infection with the SARS-CoV-2 virus that causes COVID-19. Institutional protocols and algorithms that pertain to the evaluation of patients at risk for COVID-19 are in a state of rapid change based on information released by regulatory bodies including the CDC and federal and state organizations. These policies and algorithms were followed during the patient's care.

## 2021-01-06 NOTE — ED Notes (Signed)
Jeffery Rogers at [facility] [phone] and wife would like to know if pt is coming home tonight or being admitted

## 2021-01-06 NOTE — ED Notes (Signed)
Bladder scan done,  34mL.

## 2021-01-06 NOTE — ED Triage Notes (Signed)
Arrived via EMS; from [facility]. Staff c/o catheter problem, + blood and no urine output x 24 hrs. EMS endorsed concerned for increasing altered in mentation, last well known was sometime last night. A&O x2 at baseline, GCS 9,

## 2021-01-06 NOTE — Consult Note (Signed)
H&P Physician requesting consult: Dene Gentry  Chief Complaint: Traumatic Foley catheter removal, difficult Foley  History of Present Illness: 87 year old male managing his bladder with Foley catheter traumatically removed his Foley.  He reportedly had blood from the meatus.  Gentle attempted catheter placement was made but met resistance and therefore I was called for assistance.  Patient has dementia.  His daughter accompanies him.  Past Medical History:  Diagnosis Date  . Arthritis    "right arm/shoulder" (02/10/2016)  . Complete heart block ([place]) 02/10/2016   a. s/p Emergency planning/management officer  . Goiter   . Hypercholesteremia   . Memory loss   . Nasal septal deviation   . Persistent atrial fibrillation ([place])   . Presence of permanent cardiac pacemaker   . 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 Severe aortic stenosis   . Tremor    Past Surgical History:  Procedure Laterality Date  . CATARACT EXTRACTION W/ INTRAOCULAR LENS  IMPLANT, BILATERAL Bilateral ~ 2000  . EP IMPLANTABLE DEVICE N/A 02/11/2016   Procedure: Pacemaker Implant;  Surgeon: Jungbluth Lance, MD;  Location: [facility];  Service: Cardiovascular;  Laterality: N/A;  . RIGHT/LEFT HEART CATH AND CORONARY ANGIOGRAPHY N/A 05/03/2018   Procedure: RIGHT/LEFT HEART CATH AND CORONARY ANGIOGRAPHY;  Surgeon: Burnell Blanks, MD;  Location: [facility];  Service: Cardiovascular;  Laterality: N/A;  . TEE WITHOUT CARDIOVERSION N/A 05/30/2018   Procedure: TRANSESOPHAGEAL ECHOCARDIOGRAM (TEE);  Surgeon: Burnell Blanks, MD;  Location: [facility];  Service: Open Heart Surgery;  Laterality: N/A;  . TONSILLECTOMY  1930s  . TRANSCATHETER AORTIC VALVE REPLACEMENT, TRANSFEMORAL N/A 05/30/2018   Procedure: TRANSCATHETER AORTIC VALVE REPLACEMENT, TRANSFEMORAL using a 56m Edwards Sapien 3 Aortic Valve;   Surgeon: MBurnell Blanks MD;  Location: [facility]  Service: Open Heart Surgery;  Laterality: N/A;  . WISDOM TOOTH EXTRACTION  1990s    Home Medications:  (Not in a hospital admission)  Allergies: No Known Allergies  Family History  Problem Relation Age of Onset  . Stroke Other   . Irregular heart beat Other   . Stroke Mother   . Stroke Father   . CAD Father        MI   Social History:  reports that he has quit smoking. His smoking use included cigarettes. He has a 16.00 pack-year smoking history. He has never used smokeless tobacco. He reports that he does not drink alcohol and does not use drugs.  ROS: A complete review of systems was performed.  All systems are negative except for pertinent findings as noted. ROS   Physical Exam:  Vital signs in last 24 hours: Temp:  [97.4 F (36.3 C)-97.9 F (36.6 C)] 97.9 F (36.6 C) (04/05 1549) Pulse Rate:  [59-83] 65 (04/05 1900) Resp:  [14-32] 32 (04/05 1900) BP: (120-140)/(59-106) 125/106 (04/05 1745) SpO2:  [74 %-100 %] 99 % (04/05 1900) Weight:  [69.4 kg-72.6 kg] 72.6 kg (04/05 1558)  General: Demented but in no acute distress Lower extremities are contracted His penis is uncircumcised.  He had paraphimosis that was moderate to severe.  This was able to be reduced.  Laboratory Data:  Results for orders placed or performed during the hospital encounter of 01/06/21 (from the past 24 hour(s))  Comprehensive metabolic panel     Status: Abnormal   Collection Time: 01/06/21  4:24 PM  Result Value  Ref Range   Sodium 141 135 - 145 mmol/L   Potassium 4.4 3.5 - 5.1 mmol/L   Chloride 108 98 - 111 mmol/L   CO2 25 22 - 32 mmol/L   Glucose, Bld 109 (H) 70 - 99 mg/dL   BUN 25 (H) 8 - 23 mg/dL   Creatinine, Ser 1.09 0.61 - 1.24 mg/dL   Calcium 8.9 8.9 - 10.3 mg/dL   Total Protein 6.8 6.5 - 8.1 g/dL   Albumin 2.9 (L) 3.5 - 5.0 g/dL   AST 41 15 - 41 U/L   ALT 35 0 - 44 U/L   Alkaline Phosphatase 100 38 - 126 U/L   Total  Bilirubin 0.7 0.3 - 1.2 mg/dL   GFR, Estimated >60 >60 mL/min   Anion gap 8 5 - 15  CBC with Differential     Status: Abnormal   Collection Time: 01/06/21  4:24 PM  Result Value Ref Range   WBC 13.5 (H) 4.0 - 10.5 K/uL   RBC 3.49 (L) 4.22 - 5.81 MIL/uL   Hemoglobin 10.7 (L) 13.0 - 17.0 g/dL   HCT 33.5 (L) 39.0 - 52.0 %   MCV 96.0 80.0 - 100.0 fL   MCH 30.7 26.0 - 34.0 pg   MCHC 31.9 30.0 - 36.0 g/dL   RDW 14.6 11.5 - 15.5 %   Platelets 271 150 - 400 K/uL   nRBC 0.0 0.0 - 0.2 %   Neutrophils Relative % 87 %   Neutro Abs 11.7 (H) 1.7 - 7.7 K/uL   Lymphocytes Relative 5 %   Lymphs Abs 0.7 0.7 - 4.0 K/uL   Monocytes Relative 7 %   Monocytes Absolute 0.9 0.1 - 1.0 K/uL   Eosinophils Relative 0 %   Eosinophils Absolute 0.0 0.0 - 0.5 K/uL   Basophils Relative 0 %   Basophils Absolute 0.1 0.0 - 0.1 K/uL   Immature Granulocytes 1 %   Abs Immature Granulocytes 0.08 (H) 0.00 - 0.07 K/uL  Protime-INR     Status: None   Collection Time: 01/06/21  4:24 PM  Result Value Ref Range   Prothrombin Time 15.1 11.4 - 15.2 seconds   INR 1.2 0.8 - 1.2  Type and screen [place]     Status: None   Collection Time: 01/06/21  4:26 PM  Result Value Ref Range   ABO/RH(D) A POS    Antibody Screen NEG    Sample Expiration      01/09/2021,2359 Performed at [facility], [address], [place],  [address]    No results found for this or any previous visit (from the past 240 hour(s)). Creatinine: Recent Labs    01/06/21 1624  CREATININE 1.09   Procedure: The penis was prepped.  An 88 French coud tip catheter was advanced into the urethra and into the bladder with return of pink-tinged urine.  20 cc of sterile water was instilled into the catheter balloon to prevent removal.  I then noted that he had moderate to severe paraphimosis.  I was able to reduce this.  Impression/Assessment:  Urinary retention with traumatic Foley removal Gross  hematuria Paraphimosis  Plan:  Keep foreskin reduced to its proper position.  Continue Foley catheter.  Gross hematuria most likely secondary to the traumatic catheter removal.  Marton Redwood, III 01/06/2021, 7:25 PM

## 2021-01-07 DIAGNOSIS — G309 Alzheimer's disease, unspecified: Secondary | ICD-10-CM | POA: Diagnosis present

## 2021-01-07 DIAGNOSIS — F419 Anxiety disorder, unspecified: Secondary | ICD-10-CM

## 2021-01-07 DIAGNOSIS — L89156 Pressure-induced deep tissue damage of sacral region: Secondary | ICD-10-CM | POA: Diagnosis not present

## 2021-01-07 DIAGNOSIS — R339 Retention of urine, unspecified: Secondary | ICD-10-CM | POA: Diagnosis not present

## 2021-01-07 DIAGNOSIS — L89150 Pressure ulcer of sacral region, unstageable: Secondary | ICD-10-CM | POA: Diagnosis not present

## 2021-01-07 DIAGNOSIS — D638 Anemia in other chronic diseases classified elsewhere: Secondary | ICD-10-CM | POA: Diagnosis present

## 2021-01-07 DIAGNOSIS — Z9889 Other specified postprocedural states: Secondary | ICD-10-CM | POA: Diagnosis not present

## 2021-01-07 DIAGNOSIS — Z789 Other specified health status: Secondary | ICD-10-CM

## 2021-01-07 DIAGNOSIS — Z66 Do not resuscitate: Secondary | ICD-10-CM

## 2021-01-07 DIAGNOSIS — A4102 Sepsis due to Methicillin resistant Staphylococcus aureus: Secondary | ICD-10-CM | POA: Diagnosis present

## 2021-01-07 DIAGNOSIS — Z7401 Bed confinement status: Secondary | ICD-10-CM | POA: Diagnosis not present

## 2021-01-07 DIAGNOSIS — R29898 Other symptoms and signs involving the musculoskeletal system: Secondary | ICD-10-CM | POA: Diagnosis not present

## 2021-01-07 DIAGNOSIS — N390 Urinary tract infection, site not specified: Secondary | ICD-10-CM | POA: Diagnosis present

## 2021-01-07 DIAGNOSIS — G9341 Metabolic encephalopathy: Secondary | ICD-10-CM | POA: Diagnosis not present

## 2021-01-07 DIAGNOSIS — I442 Atrioventricular block, complete: Secondary | ICD-10-CM | POA: Diagnosis present

## 2021-01-07 DIAGNOSIS — Z515 Encounter for palliative care: Secondary | ICD-10-CM

## 2021-01-07 DIAGNOSIS — Z743 Need for continuous supervision: Secondary | ICD-10-CM | POA: Diagnosis not present

## 2021-01-07 DIAGNOSIS — T826XXA Infection and inflammatory reaction due to cardiac valve prosthesis, initial encounter: Secondary | ICD-10-CM | POA: Diagnosis not present

## 2021-01-07 DIAGNOSIS — R451 Restlessness and agitation: Secondary | ICD-10-CM | POA: Diagnosis not present

## 2021-01-07 DIAGNOSIS — I330 Acute and subacute infective endocarditis: Secondary | ICD-10-CM | POA: Diagnosis not present

## 2021-01-07 DIAGNOSIS — Z8249 Family history of ischemic heart disease and other diseases of the circulatory system: Secondary | ICD-10-CM | POA: Diagnosis not present

## 2021-01-07 DIAGNOSIS — R6520 Severe sepsis without septic shock: Secondary | ICD-10-CM | POA: Diagnosis present

## 2021-01-07 DIAGNOSIS — Z6821 Body mass index (BMI) 21.0-21.9, adult: Secondary | ICD-10-CM | POA: Diagnosis not present

## 2021-01-07 DIAGNOSIS — F909 Attention-deficit hyperactivity disorder, unspecified type: Secondary | ICD-10-CM | POA: Diagnosis not present

## 2021-01-07 DIAGNOSIS — L89892 Pressure ulcer of other site, stage 2: Secondary | ICD-10-CM | POA: Diagnosis not present

## 2021-01-07 DIAGNOSIS — B9562 Methicillin resistant Staphylococcus aureus infection as the cause of diseases classified elsewhere: Secondary | ICD-10-CM | POA: Diagnosis not present

## 2021-01-07 DIAGNOSIS — T83511A Infection and inflammatory reaction due to indwelling urethral catheter, initial encounter: Secondary | ICD-10-CM | POA: Diagnosis present

## 2021-01-07 DIAGNOSIS — R509 Fever, unspecified: Secondary | ICD-10-CM | POA: Diagnosis not present

## 2021-01-07 DIAGNOSIS — A419 Sepsis, unspecified organism: Secondary | ICD-10-CM | POA: Diagnosis not present

## 2021-01-07 DIAGNOSIS — I4819 Other persistent atrial fibrillation: Secondary | ICD-10-CM | POA: Diagnosis present

## 2021-01-07 DIAGNOSIS — Z7189 Other specified counseling: Secondary | ICD-10-CM | POA: Diagnosis not present

## 2021-01-07 DIAGNOSIS — R7881 Bacteremia: Secondary | ICD-10-CM | POA: Diagnosis not present

## 2021-01-07 DIAGNOSIS — E049 Nontoxic goiter, unspecified: Secondary | ICD-10-CM | POA: Diagnosis present

## 2021-01-07 DIAGNOSIS — R279 Unspecified lack of coordination: Secondary | ICD-10-CM | POA: Diagnosis not present

## 2021-01-07 DIAGNOSIS — F0151 Vascular dementia with behavioral disturbance: Secondary | ICD-10-CM | POA: Diagnosis not present

## 2021-01-07 DIAGNOSIS — Z20822 Contact with and (suspected) exposure to covid-19: Secondary | ICD-10-CM | POA: Diagnosis present

## 2021-01-07 DIAGNOSIS — R410 Disorientation, unspecified: Secondary | ICD-10-CM | POA: Diagnosis not present

## 2021-01-07 DIAGNOSIS — I10 Essential (primary) hypertension: Secondary | ICD-10-CM | POA: Diagnosis present

## 2021-01-07 DIAGNOSIS — Z952 Presence of prosthetic heart valve: Secondary | ICD-10-CM | POA: Diagnosis not present

## 2021-01-07 DIAGNOSIS — R54 Age-related physical debility: Secondary | ICD-10-CM | POA: Diagnosis present

## 2021-01-07 DIAGNOSIS — I350 Nonrheumatic aortic (valve) stenosis: Secondary | ICD-10-CM | POA: Diagnosis present

## 2021-01-07 DIAGNOSIS — I4891 Unspecified atrial fibrillation: Secondary | ICD-10-CM | POA: Diagnosis not present

## 2021-01-07 DIAGNOSIS — T839XXA Unspecified complication of genitourinary prosthetic device, implant and graft, initial encounter: Secondary | ICD-10-CM | POA: Diagnosis not present

## 2021-01-07 DIAGNOSIS — Z950 Presence of cardiac pacemaker: Secondary | ICD-10-CM | POA: Diagnosis not present

## 2021-01-07 DIAGNOSIS — F0150 Vascular dementia without behavioral disturbance: Secondary | ICD-10-CM | POA: Diagnosis present

## 2021-01-07 DIAGNOSIS — Z87891 Personal history of nicotine dependence: Secondary | ICD-10-CM | POA: Diagnosis not present

## 2021-01-07 DIAGNOSIS — Z823 Family history of stroke: Secondary | ICD-10-CM | POA: Diagnosis not present

## 2021-01-07 LAB — BLOOD CULTURE ID PANEL (REFLEXED) - BCID2

## 2021-01-07 LAB — CBC WITH DIFFERENTIAL/PLATELET
Abs Immature Granulocytes: 0.03 10*3/uL (ref 0.00–0.07)
Basophils Absolute: 0 10*3/uL (ref 0.0–0.1)
Basophils Relative: 0 %
Eosinophils Absolute: 0 10*3/uL (ref 0.0–0.5)
Eosinophils Relative: 0 %
HCT: 35.2 % — ABNORMAL LOW (ref 39.0–52.0)
Hemoglobin: 11.2 g/dL — ABNORMAL LOW (ref 13.0–17.0)
Immature Granulocytes: 0 %
Lymphocytes Relative: 8 %
Lymphs Abs: 0.8 10*3/uL (ref 0.7–4.0)
MCH: 30.7 pg (ref 26.0–34.0)
MCHC: 31.8 g/dL (ref 30.0–36.0)
MCV: 96.4 fL (ref 80.0–100.0)
Monocytes Absolute: 0.9 10*3/uL (ref 0.1–1.0)
Monocytes Relative: 9 %
Neutro Abs: 8.4 10*3/uL — ABNORMAL HIGH (ref 1.7–7.7)
Neutrophils Relative %: 83 %
Platelets: 233 10*3/uL (ref 150–400)
RBC: 3.65 MIL/uL — ABNORMAL LOW (ref 4.22–5.81)
RDW: 14.6 % (ref 11.5–15.5)
WBC: 10.2 10*3/uL (ref 4.0–10.5)
nRBC: 0 % (ref 0.0–0.2)

## 2021-01-07 LAB — COMPREHENSIVE METABOLIC PANEL
ALT: 35 U/L (ref 0–44)
AST: 43 U/L — ABNORMAL HIGH (ref 15–41)
Albumin: 2.6 g/dL — ABNORMAL LOW (ref 3.5–5.0)
Alkaline Phosphatase: 90 U/L (ref 38–126)
Anion gap: 9 (ref 5–15)
BUN: 24 mg/dL — ABNORMAL HIGH (ref 8–23)
CO2: 25 mmol/L (ref 22–32)
Calcium: 8.8 mg/dL — ABNORMAL LOW (ref 8.9–10.3)
Chloride: 107 mmol/L (ref 98–111)
Creatinine, Ser: 0.95 mg/dL (ref 0.61–1.24)
GFR, Estimated: >60 mL/min (ref >60)
Glucose, Bld: 119 mg/dL — ABNORMAL HIGH (ref 70–99)
Potassium: 4.4 mmol/L (ref 3.5–5.1)
Sodium: 141 mmol/L (ref 135–145)
Total Bilirubin: 0.7 mg/dL (ref 0.3–1.2)
Total Protein: 6.6 g/dL (ref 6.5–8.1)

## 2021-01-07 LAB — TSH: TSH: 0.713 u[IU]/mL (ref 0.350–4.500)

## 2021-01-07 LAB — PHOSPHORUS: Phosphorus: 3.5 mg/dL (ref 2.5–4.6)

## 2021-01-07 LAB — LACTIC ACID, PLASMA: Lactic Acid, Venous: 1.1 mmol/L (ref 0.5–1.9)

## 2021-01-07 LAB — MAGNESIUM: Magnesium: 2.1 mg/dL (ref 1.7–2.4)

## 2021-01-07 MED ORDER — TAMSULOSIN HCL 0.4 MG PO CAPS
0.4000 mg | ORAL_CAPSULE | Freq: Every day | ORAL | Status: DC
  Administered 2021-01-07 – 2021-01-13 (×7): 0.4 mg via ORAL
  Filled 2021-01-07 (×7): qty 1

## 2021-01-07 MED ORDER — MIRTAZAPINE 15 MG PO TABS
15.0000 mg | ORAL_TABLET | Freq: Every day | ORAL | Status: DC
  Administered 2021-01-08 – 2021-01-12 (×5): 15 mg via ORAL
  Filled 2021-01-07 (×5): qty 1

## 2021-01-07 MED ORDER — VANCOMYCIN HCL 1500 MG/300ML IV SOLN
1500.0000 mg | INTRAVENOUS | Status: DC
  Administered 2021-01-07 – 2021-01-08 (×2): 1500 mg via INTRAVENOUS
  Filled 2021-01-07 (×2): qty 300

## 2021-01-07 MED ORDER — CHLORHEXIDINE GLUCONATE CLOTH 2 % EX PADS
6.0000 | MEDICATED_PAD | Freq: Every day | CUTANEOUS | Status: DC
  Administered 2021-01-07 – 2021-01-13 (×5): 6 via TOPICAL

## 2021-01-07 MED ORDER — IPRATROPIUM BROMIDE 0.06 % NA SOLN
2.0000 | Freq: Four times a day (QID) | NASAL | Status: DC
  Administered 2021-01-07 – 2021-01-13 (×16): 2 via NASAL
  Filled 2021-01-07: qty 15

## 2021-01-07 MED ORDER — ALPRAZOLAM 0.25 MG PO TABS
0.2500 mg | ORAL_TABLET | Freq: Three times a day (TID) | ORAL | Status: DC | PRN
  Administered 2021-01-08 – 2021-01-10 (×3): 0.25 mg via ORAL
  Filled 2021-01-07 (×4): qty 1

## 2021-01-07 MED ORDER — SENNA 8.6 MG PO TABS
1.0000 | ORAL_TABLET | Freq: Two times a day (BID) | ORAL | Status: DC
  Administered 2021-01-07 – 2021-01-13 (×13): 8.6 mg via ORAL
  Filled 2021-01-07 (×13): qty 1

## 2021-01-07 MED ORDER — POLYVINYL ALCOHOL 1.4 % OP SOLN: 1.0000 [drp] | Freq: Four times a day (QID) | OPHTHALMIC | Status: DC | PRN

## 2021-01-07 MED ORDER — ACETAMINOPHEN 325 MG PO TABS: 650.0000 mg | ORAL_TABLET | Freq: Four times a day (QID) | ORAL | Status: DC | PRN

## 2021-01-07 MED ORDER — OXYCODONE-ACETAMINOPHEN 5-325 MG PO TABS
1.0000 | ORAL_TABLET | Freq: Four times a day (QID) | ORAL | Status: DC | PRN
  Administered 2021-01-07 – 2021-01-08 (×3): 2 via ORAL
  Administered 2021-01-11: 1 via ORAL
  Filled 2021-01-07 (×3): qty 2
  Filled 2021-01-07: qty 1
  Filled 2021-01-07: qty 2

## 2021-01-07 MED ORDER — SODIUM CHLORIDE 0.9 % IV SOLN
1.0000 g | INTRAVENOUS | Status: DC
  Administered 2021-01-07: 1 g via INTRAVENOUS
  Filled 2021-01-07: qty 10

## 2021-01-07 MED ORDER — ACETAMINOPHEN 500 MG PO TABS
1000.0000 mg | ORAL_TABLET | Freq: Three times a day (TID) | ORAL | Status: DC
  Administered 2021-01-07 – 2021-01-13 (×16): 1000 mg via ORAL
  Filled 2021-01-07 (×18): qty 2

## 2021-01-07 MED ORDER — SODIUM CHLORIDE 0.9 % IV SOLN
75.0000 mL/h | INTRAVENOUS | Status: DC
  Administered 2021-01-07: 75 mL/h via INTRAVENOUS

## 2021-01-07 MED ORDER — CLONAZEPAM 0.5 MG PO TABS
0.5000 mg | ORAL_TABLET | Freq: Two times a day (BID) | ORAL | Status: DC
  Administered 2021-01-07 – 2021-01-13 (×13): 0.5 mg via ORAL
  Filled 2021-01-07 (×13): qty 1

## 2021-01-07 MED ORDER — DOCUSATE SODIUM 100 MG PO CAPS
100.0000 mg | ORAL_CAPSULE | Freq: Two times a day (BID) | ORAL | Status: DC
  Administered 2021-01-07 – 2021-01-13 (×10): 100 mg via ORAL
  Filled 2021-01-07 (×11): qty 1

## 2021-01-07 MED ORDER — MIRTAZAPINE 15 MG PO TABS: 15.0000 mg | ORAL_TABLET | Freq: Every day | ORAL | Status: DC

## 2021-01-07 MED ORDER — QUETIAPINE FUMARATE 100 MG PO TABS
100.0000 mg | ORAL_TABLET | Freq: Every day | ORAL | Status: DC
  Administered 2021-01-08 – 2021-01-10 (×3): 100 mg via ORAL
  Filled 2021-01-07 (×5): qty 1

## 2021-01-07 MED ORDER — ACETAMINOPHEN 650 MG RE SUPP: 650.0000 mg | Freq: Four times a day (QID) | RECTAL | Status: DC | PRN

## 2021-01-07 MED ORDER — MORPHINE SULFATE (PF) 2 MG/ML IV SOLN
2.0000 mg | INTRAVENOUS | Status: DC | PRN
  Administered 2021-01-07 – 2021-01-11 (×5): 2 mg via INTRAVENOUS
  Filled 2021-01-07 (×5): qty 1

## 2021-01-07 NOTE — ED Notes (Signed)
Patient began to get agitated and pulling at catheter,  More pain meds provided for comfort.

## 2021-01-07 NOTE — ED Notes (Signed)
Report called to 5M 

## 2021-01-07 NOTE — Consult Note (Incomplete)
Consultation Note Date: 01/07/2021   Patient Name: Jeffery Rogers  DOB: [birth year]  MRN: [id number]  Age / Sex: 87 y.o., male  PCP: [name], MD Referring Physician: Geradine Girt, DO  Reason for Consultation: Establishing goals of care  HPI/Patient Profile: 87 y.o. male  with past medical history of severe aortic stenosis s/p TAVR, complete heart block s/p [employer] pacemaker placement, atrial fibrillation, advanced Alzheimer's and vascular dementia presented to the [facility] ED on 01/06/21 from [facility] with staff complaints of patient having decreased urinary output, foley catheter problems, and AMS. Patient was recently started on antibiotics for UTI prior to ED arrival as well as pulled out his foley which caused urethral bleeding. Urology was consulted to replace foley due to difficult placement issues. Patient was admitted on 01/06/2021 with sepsis secondary to UTI, hematuria secondary to traumatic foley removal, urinary retention due to traumatic foley removal, acute metabolic encephalopathy.   Of note, patient has had one recent hospitalization and ED visit: 2/18-2/23/22 hospitalization for AMS and 2/10-2/11/22 ED visit for acute right-sided thoracic back pain.   Clinical Assessment and Goals of Care: I have reviewed medical records including EPIC notes, labs, and imaging. Received report from primary RN - no acute concerns. RN reports patient is back at baseline, generally agitated, and worked with PT/OT.  Went to visit patient at bedside - [place] caregiver present. Patient was lying in bed asleep - he does wake to voice/gentle touch but does not answer questions, follow commands, or acknowledge my presence - he seems to stare past me. Patient is not able to participate in conversation. His speech is mostly unintelligible. Signs and non-verbal gestures of discomfort noted - patient is very  restless when awake. No respiratory distress, increased work of breathing, or secretions noted.  Spoke with caregiver at bedside. She reports she has been working with patient for 3 weeks; during that time patient has been treated for UTI and had foley. She states patient was able to walk minimally with assistance and walker to BR; otherwise he was confined to wheelchair. She states he was eating well until last week. Caregiver reports noting a decline in the patient over the last three weeks. Patient has been confused and she reports his current mentation/functional status is baseline - including restlessness.  Met with wife/[name] via phone  to discuss diagnosis, prognosis, GOC, EOL wishes, disposition, and options. [name] would like her daughter/[name] to be present for [place] conversation; but would also like to review and discuss information today.  I introduced Palliative Medicine as specialized medical care for people living with serious illness. It focuses on providing relief from the symptoms and stress of a serious illness. The goal is to improve quality of life for both the patient and the family.  We discussed a brief life review of the patient as well as functional and nutritional status. 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 He and his wife/[name] have been married 63 years - they have  2 children together, 1 daughter and 1 son. Prior to hospitalization, patient lived at [facility] receiving LTC. He was unable to walk and was wheelchair bound; however, he was able to roll himself to the cafeteria and vari  We discussed patient's current illness and what it means in the larger context of patient's on-going co-morbidities.  Natural disease trajectory and expectations at EOL were discussed. I attempted to elicit values and goals of care important to the patient. The difference between aggressive medical intervention and comfort care was considered in light  of the patient's goals of care.   Hospice and Palliative Care services outpatient were explained and offered.  Advance directives, concepts specific to code status, artificial feeding and hydration, and rehospitalization were considered and discussed.  Visit also consisted of discussions dealing with the complex and emotionally intense issues of symptom management and palliative care in the setting of serious and potentially life-threatening illness. Palliative care team will continue to support patient, patient's family, and medical team.  Discussed with patient/family the importance of continued conversation with each other*** and the medical providers regarding overall plan of care and treatment options, ensuring decisions are within the context of the patient's values and GOCs.    Questions and concerns were addressed. The patient/family was encouraged to call with questions and/or concerns. PMT card was provided.   Noted last EKG was completed yesterday and QTc was 458. If scheduled tylenol does not improve agitation/restlessness for possible generalized pain/discomfort, recommend starting low dose Zyprexa - of all the antipsychotics this has one of the least effects on QTc and can provide benefit of decreased agitation/hyperactivity. Since patient is not full comfort, would have risk/benefit conversation with wife before initiating.     Primary Decision Maker: NEXT OF KIN - [name]    SUMMARY OF RECOMMENDATIONS:  Continue current medical treatment   Continue DNR/DNI as previously documented  Wife is considering hospice services on discharge - wants to include daughter in conversation. Scheduled meeting with wife and daughter tomorrow 4/7 for continued GOC  Started tylenol 1075m TID for pain  Added Zyprexa 2.582mdaily at bedtime for agitation/hyperactivity ***   Code Status/Advance Care Planning:  {Palliative Code status:23503}    Symptom Management:    ***  Palliative Prophylaxis:   {Palliative Prophylaxis:21015}  Additional Recommendations (Limitations, Scope, Preferences):  {Recommended Scope and Preferences:21019}  Psycho-social/Spiritual:   Desire for further Chaplaincy support:{YES NO:22349}  Additional Recommendations: {PAL SOCIAL:21064}  Prognosis:   {Palliative Care Prognosis:23504}  Discharge Planning: {Palliative dispostion:23505}      Primary Diagnoses: Present on Admission: . UTI (urinary tract infection) due to urinary indwelling Foley catheter ([facility]. Persistent atrial fibrillation ([facility]. 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 HTN (hypertension) . Acute lower UTI . Sepsis ([facility]  I have reviewed the medical record, interviewed the patient and family, and examined the patient. The following aspects are pertinent.  Past Medical History:  Diagnosis Date  . Arthritis    "right arm/shoulder" (02/10/2016)  . Complete heart block ([place]/06/2016   a. s/p BoEmergency planning/management officer. Goiter   . Hypercholesteremia   . Memory loss   . Nasal septal deviation   . Persistent atrial fibrillation ([facility]  . Presence of permanent cardiac pacemaker   . 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 Severe aortic stenosis   . Tremor  Social History   Socioeconomic History  . Marital status: Married    Spouse name: Not on file  . Number of children: 2  . Years of education: Bachelors  . Highest education level: Not on file  Occupational History  . Occupation: Retired Chief Financial Officer  Tobacco Use  . Smoking status: Former Smoker    Packs/day: 1.00    Years: 16.00    Pack years: 16.00    Types: Cigarettes  . Smokeless tobacco: Never Used  . Tobacco comment: Quit smoking cigarettes in 1971  Vaping Use  . Vaping Use: Never used  Substance and Sexual  Activity  . Alcohol use: Never    Alcohol/week: 2.0 standard drinks    Types: 1 Cans of beer, 1 Shots of liquor per week  . Drug use: No  . Sexual activity: Yes  Other Topics Concern  . Not on file  Social History Narrative   Lives at home with his wife.   Right-handed.   1 cup coffee per day.  Occasional Coke.   Social Determinants of Health   Financial Resource Strain: Not on file  Food Insecurity: Not on file  Transportation Needs: Not on file  Physical Activity: Not on file  Stress: Not on file  Social Connections: Not on file   Family History  Problem Relation Age of Onset  . Stroke Other   . Irregular heart beat Other   . Stroke Mother   . Stroke Father   . CAD Father        MI   Scheduled Meds: . clonazePAM  0.5 mg Oral BID  . docusate sodium  100 mg Oral BID  . ipratropium  2 spray Each Nare QID  . mirtazapine  15 mg Oral Daily  . QUEtiapine  100 mg Oral QHS  . senna  1 tablet Oral BID  . tamsulosin  0.4 mg Oral Daily   Continuous Infusions: . sodium chloride 75 mL/hr (01/07/21 0128)  . cefTRIAXone (ROCEPHIN)  IV     PRN Meds:.acetaminophen **OR** acetaminophen, ALPRAZolam, morphine injection, oxyCODONE-acetaminophen, polyvinyl alcohol Medications Prior to Admission:  Prior to Admission medications   Medication Sig Start Date End Date Taking? Authorizing Provider  acetaminophen (TYLENOL) 500 MG tablet Take 1,000 mg by mouth daily.   Yes [provider]  ALPRAZolam (XANAX) 0.25 MG tablet Take 0.25 mg by mouth 3 (three) times daily as needed for anxiety.   Yes [provider]  amLODipine (NORVASC) 5 MG tablet Take 1 tablet by mouth daily. Patient taking differently: Take 5 mg by mouth daily. 04/15/20  Yes Eileen Stanford, PA-C  clonazePAM (KLONOPIN) 0.5 MG tablet Take 1 tablet (0.5 mg total) by mouth 2 (two) times daily. 12/26/20  Yes Virgie Dad, MD  ipratropium (ATROVENT) 0.03 % nasal spray Place 2 sprays into both nostrils 4 (four)  times daily as needed for rhinitis.   Yes [provider]  mirtazapine (REMERON) 15 MG tablet Take 15 mg by mouth daily. 02/19/20  Yes [provider]  nitrofurantoin, macrocrystal-monohydrate, (MACROBID) 100 MG capsule Take 100 mg by mouth 2 (two) times daily. 01/06/21 01/11/21 Yes [provider]  oxyCODONE-acetaminophen (PERCOCET) 5-325 MG tablet Take 1-2 tablets by mouth every 6 (six) hours as needed. Patient taking differently: Take 1-2 tablets by mouth every 6 (six) hours as needed for moderate pain. 11/13/20  Yes Charlesetta Shanks, MD  polyvinyl alcohol (LIQUIFILM TEARS) 1.4 % ophthalmic solution Place 1 drop into both eyes 4 (four) times daily as  needed for dry eyes.   Yes [provider]  QUEtiapine (SEROQUEL) 100 MG tablet Take 100 mg by mouth at bedtime.   Yes [provider]  saccharomyces boulardii (FLORASTOR) 250 MG capsule Take 250 mg by mouth 2 (two) times daily.   Yes [provider]  tamsulosin (FLOMAX) 0.4 MG CAPS capsule Take 1 capsule (0.4 mg total) by mouth daily. 12/17/20  Yes Mast, Man X, NP   No Known Allergies Review of Systems  Physical Exam  Vital Signs: BP (!) 142/82   Pulse (!) 59   Temp (!) 100.4 F (38 C) (Rectal)   Resp (!) 25   Ht 6' 1"  (1.854 m)   Wt 72.6 kg   SpO2 100%   BMI 21.11 kg/m      Pain Score: Asleep   SpO2: SpO2: 100 % O2 Device:SpO2: 100 % O2 Flow Rate: .   IO: Intake/output summary:   Intake/Output Summary (Last 24 hours) at 01/07/2021 1005 Last data filed at 01/07/2021 0128 Gross per 24 hour  Intake 100 ml  Output 1000 ml  Net -900 ml    LBM:   Baseline Weight: Weight: 72.6 kg Most recent weight: Weight: 72.6 kg     Palliative Assessment/Data:     Time In: *** Time Out: *** Time Total: *** Greater than 50%  of this time was spent counseling and coordinating care related to the above assessment and plan.  Signed by: Lin Landsman, NP   Please contact Palliative  Medicine Team phone at [phone] for questions and concerns.  For individual provider: See Shea Vinsant

## 2021-01-07 NOTE — Progress Notes (Signed)
Occupational Therapy Evaluation Patient Details Name: Jeffery Rogers MRN: [id number] DOB: [birth year] Today's Date: 01/07/2021    History of Present Illness Pt is an 87 y/o male admitted 4/5 secondary to AMS, decreased urine output and traumatic removal of foley catheter. Pt likely with UTI. PMH includes dementia, s/p pacemaker, s/p TAVR, and a fib.   Clinical Impression   Pt admitted with the above diagnosis, and presents to OT with the below listed deficits.  He currently requires total A for all aspects of ADLs and max A +2 for bed mobility.  Attempted to move pt into standing position, but he was unable to do so with total A +2.  Pt's hired caregiver present.  She reports pt resides in SNF, and has had a progressive decline in function.  At SNF, he requires +2 assist to pivot him to w/c, and requires total A with ADLs.  He was able to feed himself ~ 3 days PTA, but has since not been able to do so.  All OT needs can be addressed at SNF - acute OT will sign off at this time.      Follow Up Recommendations  SNF    Equipment Recommendations  None recommended by OT    Recommendations for Other Services       Precautions / Restrictions Precautions Precautions: Fall Restrictions Weight Bearing Restrictions: No      Mobility Bed Mobility Overal bed mobility: Needs Assistance Bed Mobility: Sidelying to Sit;Sit to Sidelying   Sidelying to sit: Total assist;+2 for physical assistance     Sit to sidelying: Total assist;+2 for physical assistance General bed mobility comments: Total  A +2 for all bed mobility tasks. Initially very resistive to mobility, but calmed slightly as time progressed.    Transfers Overall transfer level: Needs assistance Equipment used: 2 person hand held assist Transfers: Sit to/from Stand           General transfer comment: Attempted to stand X2 with +2 total A, however, pt only with minimal clearance of hips. Per caregiver, this is baseline for  pt.    Balance Overall balance assessment: Needs assistance Sitting-balance support: Bilateral upper extremity supported;Feet supported Sitting balance-Leahy Scale: Poor Sitting balance - Comments: reliant on mod to max A for sitting balance.                                   ADL either performed or assessed with clinical judgement   ADL Overall ADL's : Needs assistance/impaired Eating/Feeding: Total assistance;Bed level   Grooming: Total assistance;Bed level   Upper Body Bathing: Total assistance;Bed level   Lower Body Bathing: Total assistance;Bed level   Upper Body Dressing : Total assistance;Bed level   Lower Body Dressing: Total assistance;Bed level   Toilet Transfer: Total assistance   Toileting- Clothing Manipulation and Hygiene: Total assistance;Bed level       Functional mobility during ADLs: Total assistance;+2 for physical assistance       Vision         Perception     Praxis      Pertinent Vitals/Pain Pain Assessment: Faces Faces Pain Scale: Hurts a little bit Pain Location: generalized grimacing Pain Descriptors / Indicators: Grimacing Pain Intervention(s): Monitored during session     Hand Dominance Right   Extremity/Trunk Assessment Upper Extremity Assessment Upper Extremity Assessment: RUE deficits/detail;LUE deficits/detail RUE Deficits / Details: Pt moves bil. UEs spontaneously, grabbing and reaching for  therapist.  AROM elbow distally appears WFL.  Shoulder AROM not fully assessed due to cognition.  bil. UEs tremulous RUE Coordination: decreased gross motor;decreased fine motor LUE Deficits / Details: Pt moves bil. UEs spontaneously, grabbing and reaching for therapist.  AROM elbow distally appears WFL.  Shoulder AROM not fully assessed due to cognition.  bil. UEs tremulous LUE Coordination: decreased gross motor;decreased fine motor   Lower Extremity Assessment Lower Extremity Assessment: Defer to PT evaluation    Cervical / Trunk Assessment Cervical / Trunk Assessment: Kyphotic   Communication Communication Communication: HOH   Cognition Arousal/Alertness: Awake/alert Behavior During Therapy: Restless Overall Cognitive Status: History of cognitive impairments - at baseline                                 General Comments: Pt's caregiver reports pt close to his baseline. Nonsensical speech at times. Would respond appropriately occasionally.   General Comments  Pt's caregiver present.    Exercises     Shoulder Instructions      Home Living Family/patient expects to be discharged to:: Skilled nursing facility                                 Additional Comments: Pt has 24/7 caregiver at SNF.  Lives at [facility]      Prior Functioning/Environment Level of Independence: Needs assistance  Gait / Transfers Assistance Needed: Pt's caregiver present and reports pt requiring significant 2 person assist to transfer to<>from WC. Per pt's caregiver, pt has had steady decline and has not walked in a few weeks. ADL's / Homemaking Assistance Needed: Total A for ADLs. Pt's caregiver reports in the last 3 days, she has had to feed him where previously he was able to feed himself.            OT Problem List: Decreased strength;Decreased activity tolerance;Impaired balance (sitting and/or standing);Decreased coordination;Decreased cognition;Decreased safety awareness;Decreased knowledge of use of DME or AE;Impaired UE functional use      OT Treatment/Interventions:      OT Goals(Current goals can be found in the care plan section) Acute Rehab OT Goals Patient Stated Goal: to return to SNF per caregiver OT Goal Formulation: All assessment and education complete, DC therapy  OT Frequency:     Barriers to D/C:            Co-evaluation PT/OT/SLP Co-Evaluation/Treatment: Yes Reason for Co-Treatment: Complexity of the patient's impairments (multi-system  involvement);Necessary to address cognition/behavior during functional activity;For patient/therapist safety PT goals addressed during session: Mobility/safety with mobility;Balance OT goals addressed during session: Strengthening/ROM      AM-PAC OT "6 Clicks" Daily Activity     Outcome Measure Help from another person eating meals?: Total Help from another person taking care of personal grooming?: Total Help from another person toileting, which includes using toliet, bedpan, or urinal?: Total Help from another person bathing (including washing, rinsing, drying)?: Total Help from another person to put on and taking off regular upper body clothing?: Total Help from another person to put on and taking off regular lower body clothing?: Total 6 Click Score: 6   End of Session Nurse Communication: Mobility status  Activity Tolerance: Other (comment) (impaired cognition) Patient left:    OT Visit Diagnosis: Unsteadiness on feet (R26.81);Cognitive communication deficit (R41.841)  Time: 9276-3943 OT Time Calculation (min): 20 min Charges:  OT General Charges $OT Visit: 1 Visit OT Evaluation $OT Eval Moderate Complexity: 1 Mod  Jeffery Nutting., OTR/L Acute Rehabilitation Services Pager [phone] Office [phone]   Jeffery Rogers M 01/07/2021, 10:16 AM

## 2021-01-07 NOTE — Evaluation (Signed)
Physical Therapy Evaluation and Discharge Patient Details Name: Jeffery Rogers MRN: [id number] DOB: Jul 16, 1932 Today's Date: 01/07/2021   History of Present Illness  Pt is an 87 y/o male admitted 4/5 secondary to AMS, decreased urine output and traumatic removal of foley catheter. Pt likely with UTI. PMH includes dementia, s/p pacemaker, s/p TAVR, and a fib.  Clinical Impression  Pt admitted secondary to problem above with deficits below. Restless throughout session and slightly resistive to mobility tasks. Requiring total A +2 for bed mobility. Was unable to stand with +2 total A. Caregiver present throughout session and reports pt is close to his new baseline as he has not ambulated in weeks and requires heavy 2 person assist to get to [facility]. Given pt is close to baseline, no further skilled PT needs at this time; all further needs can be deferred to SNF. Will sign off. If needs change, please re-consult.     Follow Up Recommendations SNF;Supervision/Assistance - 24 hour    Equipment Recommendations  Other (comment) (hoyer lift with pad)    Recommendations for Other Services       Precautions / Restrictions Precautions Precautions: Fall Restrictions Weight Bearing Restrictions: No      Mobility  Bed Mobility Overal bed mobility: Needs Assistance Bed Mobility: Sidelying to Sit;Sit to Sidelying   Sidelying to sit: Total assist;+2 for physical assistance     Sit to sidelying: Total assist;+2 for physical assistance General bed mobility comments: Total  A +2 for all bed mobility tasks. Initially very resistive to mobility, but calmed slightly as time progressed.    Transfers Overall transfer level: Needs assistance Equipment used: 2 person hand held assist Transfers: Sit to/from Stand           General transfer comment: Attempted to stand X2 with +2 total A, however, pt only with minimal clearance of hips. Per caregiver, this is baseline for pt.  Ambulation/Gait                 Stairs            Wheelchair Mobility    Modified Rankin (Stroke Patients Only)       Balance Overall balance assessment: Needs assistance Sitting-balance support: Bilateral upper extremity supported;Feet supported Sitting balance-Leahy Scale: Poor Sitting balance - Comments: reliant on mod to max A for sitting balance.                                     Pertinent Vitals/Pain Pain Assessment: Faces Faces Pain Scale: Hurts a little bit Pain Location: generalized grimacing Pain Descriptors / Indicators: Grimacing Pain Intervention(s): Monitored during session;Limited activity within patient's tolerance;Repositioned    Home Living Family/patient expects to be discharged to:: Skilled nursing facility                 Additional Comments: Pt has 24/7 caregiver at [facility].    Prior Function Level of Independence: Needs assistance   Gait / Transfers Assistance Needed: Pt's caregiver present and reports pt requiring significant 2 person assist to transfer to<>from WC. Per pt's caregiver, pt has had steady decline and has not walked in a few weeks.  ADL's / Homemaking Assistance Needed: Total A for ADLs. Pt's caregiver reports in the last 3 days, she has had to feed him where previously he was able to feed himself.        Hand Dominance  Extremity/Trunk Assessment   Upper Extremity Assessment Upper Extremity Assessment: Defer to OT evaluation    Lower Extremity Assessment Lower Extremity Assessment: Generalized weakness;Difficult to assess due to impaired cognition    Cervical / Trunk Assessment Cervical / Trunk Assessment: Kyphotic  Communication   Communication: HOH  Cognition Arousal/Alertness: Awake/alert Behavior During Therapy: Restless Overall Cognitive Status: History of cognitive impairments - at baseline                                 General Comments: Pt's caregiver reports pt close to his  baseline. Nonsensical speech at times. Would respond appropriately occasionally.      General Comments General comments (skin integrity, edema, etc.): Pt's caregiver present    Exercises     Assessment/Plan    PT Assessment Patent does not need any further PT services;All further PT needs can be met in the next venue of care  PT Problem List Decreased strength;Decreased range of motion;Decreased activity tolerance;Decreased mobility;Decreased balance;Decreased cognition;Decreased knowledge of use of DME;Decreased safety awareness;Decreased knowledge of precautions       PT Treatment Interventions      PT Goals (Current goals can be found in the Care Plan section)  Acute Rehab PT Goals Patient Stated Goal: to return to SNF per caregiver PT Goal Formulation: All assessment and education complete, DC therapy Time For Goal Achievement: 01/07/21 Potential to Achieve Goals: Fair    Frequency     Barriers to discharge        Co-evaluation PT/OT/SLP Co-Evaluation/Treatment: Yes Reason for Co-Treatment: Complexity of the patient's impairments (multi-system involvement);Necessary to address cognition/behavior during functional activity;For patient/therapist safety PT goals addressed during session: Mobility/safety with mobility;Balance         AM-PAC PT "6 Clicks" Mobility  Outcome Measure Help needed turning from your back to your side while in a flat bed without using bedrails?: Total Help needed moving from lying on your back to sitting on the side of a flat bed without using bedrails?: Total Help needed moving to and from a bed to a chair (including a wheelchair)?: Total Help needed standing up from a chair using your arms (e.g., wheelchair or bedside chair)?: Total Help needed to walk in hospital room?: Total Help needed climbing 3-5 steps with a railing? : Total 6 Click Score: 6    End of Session   Activity Tolerance: Patient tolerated treatment well Patient left: in  bed;with call bell/phone within reach;with nursing/sitter in room (on stretcher in ED) Nurse Communication: Mobility status PT Visit Diagnosis: Muscle weakness (generalized) (M62.81);Difficulty in walking, not elsewhere classified (R26.2)    Time: 3361-2244 PT Time Calculation (min) (ACUTE ONLY): 20 min   Charges:   PT Evaluation $PT Eval Moderate Complexity: 1 Mod          Reuel Derby, PT, DPT  Acute Rehabilitation Services  Pager: [phone] Office: [phone]   Rudean Hitt 01/07/2021, 10:09 AM

## 2021-01-07 NOTE — ED Notes (Signed)
Patient awake, readjusted blankets.

## 2021-01-07 NOTE — Consult Note (Addendum)
Consultation Note Date: 01/07/2021   Patient Name: Jeffery Rogers Rogers  DOB: 23-Jun-1932  MRN: [id number]  Age / Sex: 87 y.o., male  PCP: Jeffery Pretty, MD Referring Physician: Geradine Girt, DO  Reason for Consultation: Establishing goals of care  HPI/Patient Profile: 87 y.o. male  with past medical history of severe aortic stenosis s/p TAVR, complete heart block s/p [employer] pacemaker placement, atrial fibrillation, advanced Alzheimer's and vascular dementia presented to the [facility] ED on 01/06/21 from [facility] with staff complaints of patient having decreased urinary output, foley catheter problems, and AMS. Patient was recently started on antibiotics for UTI prior to ED arrival as well as pulled out his foley which caused urethral bleeding. Urology was consulted to replace foley due to difficult placement issues. Patient was admitted on 01/06/2021 with sepsis secondary to UTI, hematuria secondary to traumatic foley removal, urinary retention due to traumatic foley removal, acute metabolic encephalopathy.   Of note, patient has had one recent hospitalization and ED visit: 2/18-2/23/22 hospitalization for AMS and 2/10-2/11/22 ED visit for acute right-sided thoracic back pain.   Clinical Assessment and Goals of Care: I have reviewed medical records including EPIC notes, labs, and imaging. Received report from primary RN - no acute concerns. RN reports patient is back at baseline, generally agitated, and worked with PT/OT.  Went to visit patient at bedside - Jeffery Rogers Rogers caregiver present. Patient was lying in bed asleep - he does wake to voice/gentle touch but does not answer questions, follow commands, or acknowledge my presence - he seems to stare past me. Patient is not able to participate in conversation. His speech is mostly unintelligible. Signs and non-verbal gestures of discomfort noted - patient is very  restless when awake. No respiratory distress, increased work of breathing, or secretions noted.  Spoke with caregiver at bedside. She reports she has been working with patient for 3 weeks; during that time patient has been treated for UTI and had foley. She states patient was able to walk minimally with assistance and walker to BR; otherwise he was confined to wheelchair. She states he was eating well until last week. Caregiver reports noting a decline in the patient over the last three weeks. Patient has been confused and she reports his current mentation/functional status is baseline - including restlessness.  Met with wife/Jeffery Rogers via phone  to discuss diagnosis, prognosis, GOC, EOL wishes, disposition, and options. Jeffery Rogers Rogers would like her daughter/Jeffery Rogers Rogers to be present for [place] conversation; but would also like to review and discuss information today.  I introduced Palliative Medicine as specialized medical care for people living with serious illness. It focuses on providing relief from the symptoms and stress of a serious illness. The goal is to improve quality of life for both the patient and the family.  We discussed a brief life review of the patient as well as functional and nutritional status. 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 He and his wife/Jeffery Rogers have been married 63 years - they have  2 children together, 1 daughter and 1 son. Prior to hospitalization, patient lived at [facility] receiving LTC. He was unable to walk and was wheelchair bound; however, he was able to roll himself to the cafeteria and varius activities at the facility. Jeffery Rogers Rogers needs assistance for all ADLs. Per Jeffery Rogers patient was eating and drinking well prior to hospitalization. Albumin noted to be 2.6 on admission. Jeffery Rogers reports the patient was diagnosed with dementia 5-6 years ago and had chronic issues with dehydration.   We discussed patient's current illness and what it means in  the larger context of patient's on-going co-morbidities. Jeffery Rogers Rogers has a clear understanding of the patient's current medical situation. Jeffery Rogers Rogers understands that dementia is a progressive, non-curable disease underlying the patient's current acute medical conditions. Natural disease trajectory for dementia and expectations at EOL were discussed. I attempted to elicit values and goals of care important to the patient. The difference between aggressive medical intervention and comfort care was considered in light of the patient's goals of care. We discussed the patient' decreased oral intake and reviewed his albumin - reviewed that his current intake is likely not enough to sustain him long term. We reviewed that malnutrition is a poor prognostic indicator. I introduced the concept of a comfort path to [facility] and encouraged her to think about at what point patient would want to stop aggressive medical interventions and focus on comfort and dignity rather than cure/prolonging life - especially in context of his agitation/restlessness/hyperactivity. Introduced hospice philosophy and provided information on home vs residential hospice services. Provided education and counseling at length on the philosophy and benefits of hospice care. Discussed that it offers a holistic approach to care in the setting of end-stage illness/disease, and is about supporting the patient where they are allowing nature to take it's course. Discussed the hospice team includes RNs, physicians, social workers, and chaplains. They can provide personal care, support for the family, and help keep patient out of the hospital.   Jeffery Rogers Rogers would like to treat the treatable for now. She is considering discharge back to [facility] with hospice but would like to discuss with her daughter/Jeffery Rogers Rogers. Jeffery Rogers would appreciate PMT assistance with having another GOC to include Jeffery Rogers Rogers if her schedule permits Jeffery Rogers Rogers is a Pharmacist, hospital).   Hospice and Palliative Care  services outpatient were explained and offered.  Jeffery Rogers Rogers understands that without discharge with hospice services, patient is at high risk for rehospitalization.  Discussed with Jeffery Rogers Rogers the importance of continued conversation with family and the medical providers regarding overall plan of care and treatment options, ensuring decisions are within the context of the patient's values and GOCs.    Questions and concerns were addressed. The patient/family was encouraged to call with questions and/or concerns. PMT number was provided.  Primary Decision Maker: NEXT OF KIN - wife/Jeffery Rogers Rogers    SUMMARY OF RECOMMENDATIONS:  Continue current medical treatment - treat the treatable  Continue DNR/DNI as previously documented  Wife is considering hospice services on discharge - wants to include daughter in conversation. PMT to speak with wife tomorrow 4/7 and schedule family meeting   Started tylenol 1012m TID for generalized pain/discomfort   Noted last EKG was completed yesterday 4/5 and QTc was 458. If scheduled tylenol does not improve agitation/restlessness for possible generalized pain/discomfort, recommend starting low dose Zyprexa 2.5 mg daily at bedtime - of all the antipsychotics this has one of the least effects on QTc and can provide benefit of decreased agitation/hyperactivity. Since patient is not full comfort,  would have risk/benefit conversation with wife before initiating.   PMT will continue to follow and support holistically   Code Status/Advance Care Planning:  DNR   Palliative Prophylaxis:   Aspiration, Bowel Regimen, Delirium Protocol, Frequent Pain Assessment, Oral Care and Turn Reposition  Additional Recommendations (Limitations, Scope, Preferences):  Full Scope Treatment  Psycho-social/Spiritual:  Created space and opportunity for patient and family to express thoughts and feelings regarding patient's current medical situation.   Emotional support and  therapeutic listening provided.  Prognosis:   Unable to determine  Discharge Planning: To Be Determined      Primary Diagnoses: Present on Admission: . UTI (urinary tract infection) due to urinary indwelling Foley catheter ([place]) . Persistent atrial fibrillation ([place]) . 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 HTN (hypertension) . Acute lower UTI . Sepsis ([place])   I have reviewed the medical record, interviewed the patient and family, and examined the patient. The following aspects are pertinent.  Past Medical History:  Diagnosis Date  . Arthritis    "right arm/shoulder" (02/10/2016)  . Complete heart block ([place]) 02/10/2016   a. s/p Emergency planning/management officer  . Goiter   . Hypercholesteremia   . Memory loss   . Nasal septal deviation   . Persistent atrial fibrillation ([place])   . Presence of permanent cardiac pacemaker   . 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 Severe aortic stenosis   . Tremor    Social History   Socioeconomic History  . Marital status: Married    Spouse name: Not on file  . Number of children: 2  . Years of education: Bachelors  . Highest education level: Not on file  Occupational History  . Occupation: Retired Chief Financial Officer  Tobacco Use  . Smoking status: Former Smoker    Packs/day: 1.00    Years: 16.00    Pack years: 16.00    Types: Cigarettes  . Smokeless tobacco: Never Used  . Tobacco comment: Quit smoking cigarettes in 1971  Vaping Use  . Vaping Use: Never used  Substance and Sexual Activity  . Alcohol use: Never    Alcohol/week: 2.0 standard drinks    Types: 1 Cans of beer, 1 Shots of liquor per week  . Drug use: No  . Sexual activity: Yes  Other Topics Concern  . Not on file  Social History Narrative   Lives at home with his wife.   Right-handed.   1 cup coffee per day.   Occasional Coke.   Social Determinants of Health   Financial Resource Strain: Not on file  Food Insecurity: Not on file  Transportation Needs: Not on file  Physical Activity: Not on file  Stress: Not on file  Social Connections: Not on file   Family History  Problem Relation Age of Onset  . Stroke Other   . Irregular heart beat Other   . Stroke Mother   . Stroke Father   . CAD Father        MI   Scheduled Meds: . clonazePAM  0.5 mg Oral BID  . docusate sodium  100 mg Oral BID  . ipratropium  2 spray Each Nare QID  . mirtazapine  15 mg Oral Daily  . QUEtiapine  100 mg Oral QHS  . senna  1 tablet Oral BID  . tamsulosin  0.4 mg Oral Daily   Continuous Infusions: . sodium  chloride 75 mL/hr (01/07/21 0128)  . cefTRIAXone (ROCEPHIN)  IV     PRN Meds:.acetaminophen **OR** acetaminophen, ALPRAZolam, morphine injection, oxyCODONE-acetaminophen, polyvinyl alcohol Medications Prior to Admission:  Prior to Admission medications   Medication Sig Start Date End Date Taking? Authorizing Provider  acetaminophen (TYLENOL) 500 MG tablet Take 1,000 mg by mouth daily.   Yes [provider]  ALPRAZolam (XANAX) 0.25 MG tablet Take 0.25 mg by mouth 3 (three) times daily as needed for anxiety.   Yes [provider]  amLODipine (NORVASC) 5 MG tablet Take 1 tablet by mouth daily. Patient taking differently: Take 5 mg by mouth daily. 04/15/20  Yes Eileen Stanford, PA-C  clonazePAM (KLONOPIN) 0.5 MG tablet Take 1 tablet (0.5 mg total) by mouth 2 (two) times daily. 12/26/20  Yes Virgie Dad, MD  ipratropium (ATROVENT) 0.03 % nasal spray Place 2 sprays into both nostrils 4 (four) times daily as needed for rhinitis.   Yes [provider]  mirtazapine (REMERON) 15 MG tablet Take 15 mg by mouth daily. 02/19/20  Yes [provider]  nitrofurantoin, macrocrystal-monohydrate, (MACROBID) 100 MG capsule Take 100 mg by mouth 2 (two) times daily. 01/06/21 01/11/21 Yes  [provider]  oxyCODONE-acetaminophen (PERCOCET) 5-325 MG tablet Take 1-2 tablets by mouth every 6 (six) hours as needed. Patient taking differently: Take 1-2 tablets by mouth every 6 (six) hours as needed for moderate pain. 11/13/20  Yes Charlesetta Shanks, MD  polyvinyl alcohol (LIQUIFILM TEARS) 1.4 % ophthalmic solution Place 1 drop into both eyes 4 (four) times daily as needed for dry eyes.   Yes [provider]  QUEtiapine (SEROQUEL) 100 MG tablet Take 100 mg by mouth at bedtime.   Yes [provider]  saccharomyces boulardii (FLORASTOR) 250 MG capsule Take 250 mg by mouth 2 (two) times daily.   Yes [provider]  tamsulosin (FLOMAX) 0.4 MG CAPS capsule Take 1 capsule (0.4 mg total) by mouth daily. 12/17/20  Yes Mast, Man X, NP   No Known Allergies Review of Systems  Unable to perform ROS: Dementia    Physical Exam Vitals and nursing note reviewed.  Constitutional:      General: He is not in acute distress.    Appearance: He is ill-appearing.  Pulmonary:     Effort: No respiratory distress.  Skin:    General: Skin is warm and dry.  Neurological:     Mental Status: He is lethargic, disoriented and confused.     Motor: Weakness present.  Psychiatric:        Attention and Perception: He is inattentive.        Mood and Affect: Mood is anxious.        Behavior: Behavior is agitated and hyperactive.        Cognition and Memory: Cognition is impaired. Memory is impaired.     Vital Signs: BP (!) 142/82   Pulse (!) 59   Temp (!) 100.4 F (38 C) (Rectal)   Resp (!) 25   Ht 6' 1"  (1.854 m)   Wt 72.6 kg   SpO2 100%   BMI 21.11 kg/m      Pain Score: Asleep   SpO2: SpO2: 100 % O2 Device:SpO2: 100 % O2 Flow Rate: .   IO: Intake/output summary:   Intake/Output Summary (Last 24 hours) at 01/07/2021 1005 Last data filed at 01/07/2021 0128 Gross per 24 hour  Intake 100 ml  Output 1000 ml  Net -900 ml    LBM:  Baseline Weight: Weight:  72.6 kg Most recent weight: Weight: 72.6 kg     Palliative Assessment/Data: PPS 30%     Time In: 1010 Time Out: 1135 Time Total: 85 minutes  Greater than 50%  of this time was spent counseling and coordinating care related to the above assessment and plan.  Signed by: Lin Landsman, NP   Please contact Palliative Medicine Team phone at [phone] for questions and concerns.  For individual provider: See Shea Redcay

## 2021-01-07 NOTE — Progress Notes (Signed)
Patient w MRSA bacteremia and pacemaker. = device infection   Narrow to vancomycin  Repeat blood cultures   TTE  Formal consult in am

## 2021-01-07 NOTE — Progress Notes (Signed)
PHARMACY - PHYSICIAN COMMUNICATION CRITICAL VALUE ALERT - BLOOD CULTURE IDENTIFICATION (BCID)  Jeffery Rogers is an 87 y.o. male who presented to [facility] on 01/06/2021 with a chief complaint of foley issues.   Name of physician (or Provider) Contacted: [facility])  Current antibiotics: CTX  Changes to prescribed antibiotics recommended:  Recommendations accepted by provider  Results for orders placed or performed during the hospital encounter of 01/06/21  Blood Culture ID Panel (Reflexed) (Collected: 01/06/2021  8:35 PM)  Result Value Ref Range   Enterococcus faecalis NOT DETECTED NOT DETECTED   Enterococcus Faecium NOT DETECTED NOT DETECTED   Listeria monocytogenes NOT DETECTED NOT DETECTED   Staphylococcus species DETECTED (A) NOT DETECTED   Staphylococcus aureus (BCID) DETECTED (A) NOT DETECTED   Staphylococcus epidermidis NOT DETECTED NOT DETECTED   Staphylococcus lugdunensis NOT DETECTED NOT DETECTED   Streptococcus species NOT DETECTED NOT DETECTED   Streptococcus agalactiae NOT DETECTED NOT DETECTED   Streptococcus pneumoniae NOT DETECTED NOT DETECTED   Streptococcus pyogenes NOT DETECTED NOT DETECTED   A.calcoaceticus-baumannii NOT DETECTED NOT DETECTED   Bacteroides fragilis NOT DETECTED NOT DETECTED   Enterobacterales NOT DETECTED NOT DETECTED   Enterobacter cloacae complex NOT DETECTED NOT DETECTED   Escherichia coli NOT DETECTED NOT DETECTED   Klebsiella aerogenes NOT DETECTED NOT DETECTED   Klebsiella oxytoca NOT DETECTED NOT DETECTED   Klebsiella pneumoniae NOT DETECTED NOT DETECTED   Proteus species NOT DETECTED NOT DETECTED   Salmonella species NOT DETECTED NOT DETECTED   Serratia marcescens NOT DETECTED NOT DETECTED   Haemophilus influenzae NOT DETECTED NOT DETECTED   Neisseria meningitidis NOT DETECTED NOT DETECTED   Pseudomonas aeruginosa NOT DETECTED NOT DETECTED   Stenotrophomonas maltophilia NOT DETECTED NOT DETECTED   Candida albicans NOT DETECTED  NOT DETECTED   Candida auris NOT DETECTED NOT DETECTED   Candida glabrata NOT DETECTED NOT DETECTED   Candida krusei NOT DETECTED NOT DETECTED   Candida parapsilosis NOT DETECTED NOT DETECTED   Candida tropicalis NOT DETECTED NOT DETECTED   Cryptococcus neoformans/gattii NOT DETECTED NOT DETECTED   Meth resistant mecA/C and MREJ DETECTED (A) NOT DETECTED    Einar Grad 01/07/2021  7:19 PM

## 2021-01-07 NOTE — Progress Notes (Signed)
Pharmacy Antibiotic Note  Jeffery Rogers is a 87 y.o. male admitted on 01/06/2021 with foley issues found to have MRSA bacteremia. Pharmacy has been consulted for vancomycin dosing. SCr is at baseline ~1, pt initially started on ceftriaxone on admit for possible UTI.  Plan: -Vancomycin 1500mg  IV q24h - est AUC 536 -Follow ID workup, Cr, repeat cultures -Vancomycin levels at Css -Continue ceftriaxone for now with UTI concern, but can likely stop in am  Height: 6\' 1"  (185.4 cm) Weight: 72.6 kg (160 lb) IBW/kg (Calculated) : 79.9  Temp (24hrs), Avg:100.8 F (38.2 C), Min:99.9 F (37.7 C), Max:102.1 F (38.9 C)  Recent Labs  Lab 01/06/21 1624 01/06/21 2021 01/07/21 0700 01/07/21 1400  WBC 13.5*  --  10.2  --   CREATININE 1.09  --  0.95  --   LATICACIDVEN  --  1.4  --  1.1    Estimated Creatinine Clearance: 56.3 mL/min (by C-G formula based on SCr of 0.95 mg/dL).    No Known Allergies  Antimicrobials this admission: Ceftriaxone 4/5 >>  Vancomycin 4/6 >>    Microbiology results: 4/6 BCx: MRSA  Thank you for allowing pharmacy to be a part of this patient's care.  Arrie Senate, PharmD, BCPS, [facility] Clinical Pharmacist [phone] Please check AMION for all [place] numbers 01/07/2021

## 2021-01-07 NOTE — Progress Notes (Signed)
Progress Note    Jeffery Rogers  OVF:[id number] DOB: [birth year]  DOA: 01/06/2021 PCP: [name], MD    Brief Narrative:     Medical records reviewed and are as summarized below:  Jeffery Rogers is an 87 y.o. male with medical history significant of dementia, complete heart block status post pacemaker, HLD, persistent atrial fibrillation,, status post TAVR secondary to severe aortic stenosis.   Pulled out his foley catheter with some bleeding after that had decreased urine output.  Outpatient they try to replace Foley but could not.  He continued to have bloody drainage.  Recently treated with nitrofurantoin for UTI.   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)   Acute lower UTI   HTN (hypertension)   UTI (urinary tract infection) due to urinary indwelling Foley catheter (HCC)   Sepsis ([place])    sepsis due to UTI (urinary tract infection) due to urinary indwelling Foley catheter (HCC)  -IV Rocephin    Persistent atrial fibrillation (HCC)  -not on anticoagulation  -status post pacemaker  Mixed Alzheimer's and vascular dementia with behavior disturbances ([place]) Private sitter one-on-one is at bedside  HTN (hypertension)  -allow permissive hypertension for tonight given some soft blood pressures     Family Communication/Anticipated D/C date and plan/Code Status   DVT prophylaxis: scd Code Status: DNR Family Communication: caregiver at bedside Disposition Plan: Status is: Observation  The patient will require care spanning > 2 midnights and should be moved to inpatient because: Inpatient level of care appropriate due to severity of illness  Dispo: The patient is from: Home              Anticipated d/c is to: tbd              Patient currently is not medically stable to  d/c.   Difficult to place patient No     Medical Consultants:    None.     Subjective:   Working with PT  Objective:    Vitals:   01/07/21 0700 01/07/21 0900 01/07/21 1000 01/07/21 1100  BP: (!) 142/82 111/79 (!) 105/53 115/85  Pulse: (!) 59 64 (!) 59 61  Resp: (!) 25     Temp:   99.9 F (37.7 C)   TempSrc:   Oral   SpO2: 100% 100% 100% 100%  Weight:      Height:        Intake/Output Summary (Last 24 hours) at 01/07/2021 1122 Last data filed at 01/07/2021 0128 Gross per 24 hour  Intake 100 ml  Output 1000 ml  Net -900 ml   Filed Weights   01/06/21 1558  Weight: 72.6 kg    Exam:  General: Appearance:    elderly male in no acute distress     Lungs:     respirations unlabored  Heart:    Normal heart rate.   MS:   All extremities are intact.   Neurologic:   Awake, alert, pleasantly confused    Data Reviewed:   I have personally reviewed following labs and imaging studies:  Labs: Labs show the following:   Basic Metabolic Panel: Recent Labs  Lab 01/06/21 1624 01/07/21 0700  NA 141 141  K 4.4 4.4  CL 108 107  CO2 25 25  GLUCOSE 109* 119*  BUN 25* 24*  CREATININE  1.09 0.95  CALCIUM 8.9 8.8*  MG  --  2.1  PHOS  --  3.5   GFR Estimated Creatinine Clearance: 56.3 mL/min (by C-G formula based on SCr of 0.95 mg/dL). Liver Function Tests: Recent Labs  Lab 01/06/21 1624 01/07/21 0700  AST 41 43*  ALT 35 35  ALKPHOS 100 90  BILITOT 0.7 0.7  PROT 6.8 6.6  ALBUMIN 2.9* 2.6*   No results for input(s): LIPASE, AMYLASE in the last 168 hours. No results for input(s): AMMONIA in the last 168 hours. Coagulation profile Recent Labs  Lab 01/06/21 1624  INR 1.2    CBC: Recent Labs  Lab 01/06/21 1624 01/07/21 0700  WBC 13.5* 10.2  NEUTROABS 11.7* 8.4*  HGB 10.7* 11.2*  HCT 33.5* 35.2*  MCV 96.0 96.4  PLT 271 233   Cardiac Enzymes: No results for input(s): CKTOTAL, CKMB, CKMBINDEX, TROPONINI in the last 168 hours. BNP (last 3  results) No results for input(s): PROBNP in the last 8760 hours. CBG: No results for input(s): GLUCAP in the last 168 hours. D-Dimer: No results for input(s): DDIMER in the last 72 hours. Hgb A1c: No results for input(s): HGBA1C in the last 72 hours. Lipid Profile: No results for input(s): CHOL, HDL, LDLCALC, TRIG, CHOLHDL, LDLDIRECT in the last 72 hours. Thyroid function studies: No results for input(s): TSH, T4TOTAL, T3FREE, THYROIDAB in the last 72 hours.  Invalid input(s): FREET3 Anemia work up: No results for input(s): VITAMINB12, FOLATE, FERRITIN, TIBC, IRON, RETICCTPCT in the last 72 hours. Sepsis Labs: Recent Labs  Lab 01/06/21 1624 01/06/21 2021 01/07/21 0700  WBC 13.5*  --  10.2  LATICACIDVEN  --  1.4  --     Microbiology Recent Results (from the past 240 hour(s))  Resp Panel by RT-PCR (Flu A&B, Covid) Urine, Catheterized     Status: None   Collection Time: 01/06/21  9:18 PM   Specimen: Urine, Catheterized; Nasopharyngeal(NP) swabs in vial transport medium  Result Value Ref Range Status   SARS Coronavirus 2 by RT PCR NEGATIVE NEGATIVE Final    Comment: (NOTE) SARS-CoV-2 target nucleic acids are NOT DETECTED.  The SARS-CoV-2 RNA is generally detectable in upper respiratory specimens during the acute phase of infection. The lowest concentration of SARS-CoV-2 viral copies this assay can detect is 138 copies/mL. A negative result does not preclude SARS-Cov-2 infection and should not be used as the sole basis for treatment or other patient management decisions. A negative result may occur with  improper specimen collection/handling, submission of specimen other than nasopharyngeal swab, presence of viral mutation(s) within the areas targeted by this assay, and inadequate number of viral copies(<138 copies/mL). A negative result must be combined with clinical observations, patient history, and epidemiological information. The expected result is Negative.  Fact  Sheet for Patients:  EntrepreneurPulse.com.au  Fact Sheet for Healthcare Providers:  IncredibleEmployment.be  This test is no t yet approved or cleared by the [place] FDA and  has been authorized for detection and/or diagnosis of SARS-CoV-2 by FDA under an Emergency Use Authorization (EUA). This EUA will remain  in effect (meaning this test can be used) for the duration of the COVID-19 declaration under Section 564(b)(1) of the Act, 21 U.S.C.section 360bbb-3(b)(1), unless the authorization is terminated  or revoked sooner.       Influenza A by PCR NEGATIVE NEGATIVE Final   Influenza B by PCR NEGATIVE NEGATIVE Final    Comment: (NOTE) The Xpert Xpress SARS-CoV-2/FLU/RSV plus assay is intended as an aid in  the diagnosis of influenza from Nasopharyngeal swab specimens and should not be used as a sole basis for treatment. Nasal washings and aspirates are unacceptable for Xpert Xpress SARS-CoV-2/FLU/RSV testing.  Fact Sheet for Patients: EntrepreneurPulse.com.au  Fact Sheet for Healthcare Providers: IncredibleEmployment.be  This test is not yet approved or cleared by the [place] FDA and has been authorized for detection and/or diagnosis of SARS-CoV-2 by FDA under an Emergency Use Authorization (EUA). This EUA will remain in effect (meaning this test can be used) for the duration of the COVID-19 declaration under Section 564(b)(1) of the Act, 21 U.S.C. section 360bbb-3(b)(1), unless the authorization is terminated or revoked.  Performed at [facility], [place] [address], [place], [place] [address]     Procedures and diagnostic studies:  No results found.  Medications:   . clonazePAM  0.5 mg Oral BID  . docusate sodium  100 mg Oral BID  . ipratropium  2 spray Each Nare QID  . mirtazapine  15 mg Oral Daily  . QUEtiapine  100 mg Oral QHS  . senna  1 tablet Oral BID  . tamsulosin  0.4  mg Oral Daily   Continuous Infusions: . cefTRIAXone (ROCEPHIN)  IV       LOS: 0 days   Geradine Girt  [facility]   How to contact the [facility] Attending or Consulting provider [place] or covering provider during after hours [place], for this patient?  1. Check the care team in [facility] and look for a) attending/consulting TR[facility] provider listed and b) the [facility] team listed 2. Log into [email or web address] and use [facility]'s universal password to access. If you do not have the password, please contact the hospital operator. 3. Locate the [facility] provider you are looking for under [facility] and page to a number that you can be directly reached. 4. If you still have difficulty reaching the provider, please page the [facility] (Director on Call) for the Hospitalists listed on amion for assistance.  01/07/2021, 11:22 AM

## 2021-01-08 ENCOUNTER — Other Ambulatory Visit (HOSPITAL_COMMUNITY): Payer: Medicare Other

## 2021-01-08 DIAGNOSIS — R7881 Bacteremia: Secondary | ICD-10-CM | POA: Diagnosis not present

## 2021-01-08 DIAGNOSIS — I350 Nonrheumatic aortic (valve) stenosis: Secondary | ICD-10-CM

## 2021-01-08 DIAGNOSIS — Z9889 Other specified postprocedural states: Secondary | ICD-10-CM

## 2021-01-08 DIAGNOSIS — R339 Retention of urine, unspecified: Secondary | ICD-10-CM

## 2021-01-08 DIAGNOSIS — I10 Essential (primary) hypertension: Secondary | ICD-10-CM

## 2021-01-08 DIAGNOSIS — B9562 Methicillin resistant Staphylococcus aureus infection as the cause of diseases classified elsewhere: Secondary | ICD-10-CM | POA: Diagnosis not present

## 2021-01-08 DIAGNOSIS — I4891 Unspecified atrial fibrillation: Secondary | ICD-10-CM

## 2021-01-08 LAB — CBC
HCT: 35.1 % — ABNORMAL LOW (ref 39.0–52.0)
Hemoglobin: 11.2 g/dL — ABNORMAL LOW (ref 13.0–17.0)
MCH: 30.2 pg (ref 26.0–34.0)
MCHC: 31.9 g/dL (ref 30.0–36.0)
MCV: 94.6 fL (ref 80.0–100.0)
Platelets: 189 10*3/uL (ref 150–400)
RBC: 3.71 MIL/uL — ABNORMAL LOW (ref 4.22–5.81)
RDW: 14.5 % (ref 11.5–15.5)
WBC: 10.9 10*3/uL — ABNORMAL HIGH (ref 4.0–10.5)
nRBC: 0 % (ref 0.0–0.2)

## 2021-01-08 LAB — BASIC METABOLIC PANEL
Anion gap: 6 (ref 5–15)
BUN: 31 mg/dL — ABNORMAL HIGH (ref 8–23)
CO2: 25 mmol/L (ref 22–32)
Calcium: 8.8 mg/dL — ABNORMAL LOW (ref 8.9–10.3)
Chloride: 110 mmol/L (ref 98–111)
Creatinine, Ser: 0.87 mg/dL (ref 0.61–1.24)
GFR, Estimated: >60 mL/min (ref >60)
Glucose, Bld: 105 mg/dL — ABNORMAL HIGH (ref 70–99)
Potassium: 3.9 mmol/L (ref 3.5–5.1)
Sodium: 141 mmol/L (ref 135–145)

## 2021-01-08 NOTE — Consult Note (Signed)
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 (HCC)   Acute lower UTI   HTN (hypertension)   UTI (urinary tract infection) due to urinary indwelling Foley catheter ([place])   Sepsis ([place])   UTI (urinary tract infection)   Antibiotics: Vancomycin 4/6-                    Ceftriaxone 4/5   Assessment Community Acquired MRSA bacteremia Complete Heart Block s/p PMK Severe Aortic Stenosis s/p TAVR Advanced Dementia Urinary retention s/p replacement of Foleys    Recommendations  Continue Vancomycin, pharmacy to dose Follow up repeat blood cx ( ordered) Fu TTE Given patient has PMK and h/o TAVR, with Staph bacteremia, this is presumed to be infected and will need to be worked up by TEE and Cardiology consult for possible device extraction ideally Palliative care has been consulted for [place] discussion with his wife potentially considering hospice. There is another family meeting planned for today. Will need to follow up on [place] discussion with family  For pursuing further work up  Monitor CBC, BMP and Vancomycin trough  Following  Rest of the management as per the primary team. Please call with questions or concerns.  Thank you for the consult __________________________________________________________________________________________________________ HPI and Hospital Course: 87 year old male with past medical history of advanced dementia,  complete heart block status post pacemaker, severe aortic stenosis status post TAVR, hyperlipidemia, atrial fibrillation who presented to the ED on 4/5 with complaints of altered mental status and Foley catheter problem.  Unable to obtain history from the patient.  Patient reportedly pulled out his Foley catheter with some bleeding after that and had decreased urine output, attempt was made to replace Foley's but was not successful and he continued to have bloody drainage.  He was recently treated for UTI with nitrofurantoin.  Patient was seen by urology in the ED who replaced the [facility].   At ED, she was febrile with T-max 102, WBC 13.5.  UA RBC greater than 50 leukocytes small negative nitrite WBC 11-20.  Blood cultures positive for MRSA  ROS: unobtainable due to advanced dementia   Past Medical History:  Diagnosis Date  . Arthritis    "right arm/shoulder" (02/10/2016)  . Complete heart block ([place]) 02/10/2016   a. s/p Emergency planning/management officer  . Goiter   . Hypercholesteremia   . Memory loss   . Nasal septal deviation   . Persistent atrial fibrillation ([place])   . Presence of permanent cardiac pacemaker   . 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  Severe aortic stenosis   . Tremor    Past Surgical History:  Procedure Laterality Date  . CATARACT EXTRACTION W/ INTRAOCULAR LENS  IMPLANT, BILATERAL Bilateral ~ 2000  . EP IMPLANTABLE DEVICE N/A 02/11/2016   Procedure: Pacemaker Implant;  Surgeon: Demby Lance, MD;  Location: [facility];  Service: Cardiovascular;  Laterality: N/A;  . RIGHT/LEFT HEART CATH AND CORONARY ANGIOGRAPHY N/A 05/03/2018   Procedure: RIGHT/LEFT HEART CATH AND CORONARY ANGIOGRAPHY;  Surgeon: Burnell Blanks, MD;  Location: [facility];  Service: Cardiovascular;  Laterality: N/A;  . TEE WITHOUT CARDIOVERSION N/A 05/30/2018    Procedure: TRANSESOPHAGEAL ECHOCARDIOGRAM (TEE);  Surgeon: Burnell Blanks, MD;  Location: [facility];  Service: Open Heart Surgery;  Laterality: N/A;  . TONSILLECTOMY  1930s  . TRANSCATHETER AORTIC VALVE REPLACEMENT, TRANSFEMORAL N/A 05/30/2018   Procedure: TRANSCATHETER AORTIC VALVE REPLACEMENT, TRANSFEMORAL using a 74mm Edwards Sapien 3 Aortic Valve;  Surgeon: Burnell Blanks, MD;  Location: [facility];  Service: Open Heart Surgery;  Laterality: N/A;  . WISDOM TOOTH EXTRACTION  1990s     Scheduled Meds: . acetaminophen  1,000 mg Oral TID  . Chlorhexidine Gluconate Cloth  6 each Topical Daily  . clonazePAM  0.5 mg Oral BID  . docusate sodium  100 mg Oral BID  . ipratropium  2 spray Each Nare QID  . mirtazapine  15 mg Oral QHS  . QUEtiapine  100 mg Oral QHS  . senna  1 tablet Oral BID  . tamsulosin  0.4 mg Oral Daily   Continuous Infusions: . vancomycin 1,500 mg (01/07/21 2026)   PRN Meds:.acetaminophen **OR** acetaminophen, ALPRAZolam, morphine injection, oxyCODONE-acetaminophen, polyvinyl alcohol  No Known Allergies  Social History   Socioeconomic History  . Marital status: Married    Spouse name: Not on file  . Number of children: 2  . Years of education: Bachelors  . Highest education level: Not on file  Occupational History  . Occupation: Retired Chief Financial Officer  Tobacco Use  . Smoking status: Former Smoker    Packs/day: 1.00    Years: 16.00    Pack years: 16.00    Types: Cigarettes  . Smokeless tobacco: Never Used  . Tobacco comment: Quit smoking cigarettes in 1971  Vaping Use  . Vaping Use: Never used  Substance and Sexual Activity  . Alcohol use: Never    Alcohol/week: 2.0 standard drinks    Types: 1 Cans of beer, 1 Shots of liquor per week  . Drug use: No  . Sexual activity: Yes  Other Topics Concern  . Not on file  Social History Narrative   Lives at home with his wife.   Right-handed.   1 cup coffee per day.  Occasional Coke.   Social Determinants  of Health   Financial Resource Strain: Not on file  Food Insecurity: Not on file  Transportation Needs: Not on file  Physical Activity: Not on file  Stress: Not on file  Social Connections: Not on file  Intimate Partner Violence: Not on file    Vitals BP 134/80 (BP Location: Right Arm)   Pulse 65   Temp 98.4 F (36.9 C) (Axillary)   Resp 18   Ht 6\' 1"  (1.854 m)   Wt 72.6 kg   SpO2 96%   BMI 21.11 kg/m     Physical Exam Lying in bed, eyes closed, mumbling " so far so good" Closed eyes Chest - clear air sounds CVS- Normal s1s1. Systolic murmur+ Abdomen - soft Extremities -  superficial ulceration in the rt anterior leg, mittens in hands and able to move extremities spontaneously   Pertinent Microbiology Results for orders placed or performed during the hospital encounter of 01/06/21  Culture, blood (routine x 2)     Status: Abnormal (Preliminary result)   Collection Time: 01/06/21  8:35 PM   Specimen: BLOOD  Result Value Ref Range Status   Specimen Description BLOOD RIGHT ANTECUBITAL  Final   Special Requests   Final    BOTTLES DRAWN AEROBIC AND ANAEROBIC Blood Culture adequate volume   Culture  Setup Time   Final    GRAM POSITIVE COCCI IN CLUSTERS IN BOTH AEROBIC AND ANAEROBIC BOTTLES Organism ID to follow CRITICAL RESULT CALLED TO, READ BACK BY AND VERIFIED WITH: Fayne Mediate, AT 1911 01/07/21 [facility] Performed at [facility], [place] [address] [place], [place] [address]    Culture STAPHYLOCOCCUS AUREUS (A)  Final   Report Status PENDING  Incomplete  Blood Culture ID Panel (Reflexed)     Status: Abnormal   Collection Time: 01/06/21  8:35 PM  Result Value Ref Range Status   Enterococcus faecalis NOT DETECTED NOT DETECTED Final   Enterococcus Faecium NOT DETECTED NOT DETECTED Final   Listeria monocytogenes NOT DETECTED NOT DETECTED Final   Staphylococcus species DETECTED (A) NOT DETECTED Final    Comment: CRITICAL RESULT CALLED TO, READ BACK BY AND  VERIFIED WITH: Salome Holmes PHARMD, AT 1911 01/07/21 D. VANHOOK    Staphylococcus aureus (BCID) DETECTED (A) NOT DETECTED Final    Comment: Methicillin (oxacillin)-resistant Staphylococcus aureus (MRSA). MRSA is predictably resistant to beta-lactam antibiotics (except ceftaroline). Preferred therapy is vancomycin unless clinically contraindicated. Patient requires contact precautions if  hospitalized. CRITICAL RESULT CALLED TO, READ BACK BY AND VERIFIED WITH: Salome Holmes PHARMD, AT 1911 01/07/21 D. VANHOOK    Staphylococcus epidermidis NOT DETECTED NOT DETECTED Final   Staphylococcus lugdunensis NOT DETECTED NOT DETECTED Final   Streptococcus species NOT DETECTED NOT DETECTED Final   Streptococcus agalactiae NOT DETECTED NOT DETECTED Final   Streptococcus pneumoniae NOT DETECTED NOT DETECTED Final   Streptococcus pyogenes NOT DETECTED NOT DETECTED Final   A.calcoaceticus-baumannii NOT DETECTED NOT DETECTED Final   Bacteroides fragilis NOT DETECTED NOT DETECTED Final   Enterobacterales NOT DETECTED NOT DETECTED Final   Enterobacter cloacae complex NOT DETECTED NOT DETECTED Final   Escherichia coli NOT DETECTED NOT DETECTED Final   Klebsiella aerogenes NOT DETECTED NOT DETECTED Final   Klebsiella oxytoca NOT DETECTED NOT DETECTED Final   Klebsiella pneumoniae NOT DETECTED NOT DETECTED Final   Proteus species NOT DETECTED NOT DETECTED Final   Salmonella species NOT DETECTED NOT DETECTED Final   Serratia marcescens NOT DETECTED NOT DETECTED Final   Haemophilus influenzae NOT DETECTED NOT DETECTED Final   Neisseria meningitidis NOT DETECTED NOT DETECTED Final   Pseudomonas aeruginosa NOT DETECTED NOT DETECTED Final   Stenotrophomonas maltophilia NOT DETECTED NOT DETECTED Final   Candida albicans NOT DETECTED NOT DETECTED Final   Candida auris NOT DETECTED NOT DETECTED Final   Candida glabrata NOT DETECTED NOT DETECTED Final   Candida krusei NOT DETECTED NOT DETECTED Final   Candida parapsilosis  NOT DETECTED NOT DETECTED Final   Candida tropicalis NOT DETECTED NOT DETECTED Final   Cryptococcus neoformans/gattii NOT DETECTED NOT DETECTED Final   Meth resistant mecA/C and MREJ DETECTED (A) NOT DETECTED Final    Comment: CRITICAL RESULT CALLED TO, READ BACK BY AND VERIFIED WITH: Fayne Mediate, AT 1911 01/07/21 [facility] Performed  at [facility], [place] [address] [place], [place] [address]   Culture, blood (routine x 2)     Status: None (Preliminary result)   Collection Time: 01/06/21  8:38 PM   Specimen: BLOOD RIGHT FOREARM  Result Value Ref Range Status   Specimen Description BLOOD RIGHT FOREARM  Final   Special Requests   Final    BOTTLES DRAWN AEROBIC AND ANAEROBIC Blood Culture results may not be optimal due to an inadequate volume of blood received in culture bottles   Culture   Final    NO GROWTH 1 DAY Performed at [facility], [place] [address] [place], [place] [address]    Report Status PENDING  Incomplete  Resp Panel by RT-PCR (Flu A&B, Covid) Urine, Catheterized     Status: None   Collection Time: 01/06/21  9:18 PM   Specimen: Urine, Catheterized; Nasopharyngeal(NP) swabs in vial transport medium  Result Value Ref Range Status   SARS Coronavirus 2 by RT PCR NEGATIVE NEGATIVE Final    Comment: (NOTE) SARS-CoV-2 target nucleic acids are NOT DETECTED.  The SARS-CoV-2 RNA is generally detectable in upper respiratory specimens during the acute phase of infection. The lowest concentration of SARS-CoV-2 viral copies this assay can detect is 138 copies/mL. A negative result does not preclude SARS-Cov-2 infection and should not be used as the sole basis for treatment or other patient management decisions. A negative result may occur with  improper specimen collection/handling, submission of specimen other than nasopharyngeal swab, presence of viral mutation(s) within the areas targeted by this assay, and inadequate number of viral copies(<138 copies/mL). A  negative result must be combined with clinical observations, patient history, and epidemiological information. The expected result is Negative.  Fact Sheet for Patients:  EntrepreneurPulse.com.au  Fact Sheet for Healthcare Providers:  IncredibleEmployment.be  This test is no t yet approved or cleared by the [place] FDA and  has been authorized for detection and/or diagnosis of SARS-CoV-2 by FDA under an Emergency Use Authorization (EUA). This EUA will remain  in effect (meaning this test can be used) for the duration of the COVID-19 declaration under Section 564(b)(1) of the Act, 21 U.S.C.section 360bbb-3(b)(1), unless the authorization is terminated  or revoked sooner.       Influenza A by PCR NEGATIVE NEGATIVE Final   Influenza B by PCR NEGATIVE NEGATIVE Final    Comment: (NOTE) The Xpert Xpress SARS-CoV-2/FLU/RSV plus assay is intended as an aid in the diagnosis of influenza from Nasopharyngeal swab specimens and should not be used as a sole basis for treatment. Nasal washings and aspirates are unacceptable for Xpert Xpress SARS-CoV-2/FLU/RSV testing.  Fact Sheet for Patients: EntrepreneurPulse.com.au  Fact Sheet for Healthcare Providers: IncredibleEmployment.be  This test is not yet approved or cleared by the [place] FDA and has been authorized for detection and/or diagnosis of SARS-CoV-2 by FDA under an Emergency Use Authorization (EUA). This EUA will remain in effect (meaning this test can be used) for the duration of the COVID-19 declaration under Section 564(b)(1) of the Act, 21 U.S.C. section 360bbb-3(b)(1), unless the authorization is terminated or revoked.  Performed at [facility], [place] [address] [place], [place] [address]   Urine culture     Status: Abnormal (Preliminary result)   Collection Time: 01/06/21  9:35 PM   Specimen: Urine, Random  Result Value Ref Range  Status   Specimen Description URINE, RANDOM  Final   Special Requests NONE  Final   Culture (A)  Final    10,000 COLONIES/mL GRAM NEGATIVE RODS SUSCEPTIBILITIES TO FOLLOW Performed at [facility], [place] [address], [place], [place] [address]    Report Status PENDING  Incomplete    Pertinent Lab seen by me: CBC Latest Ref Rng & Units 01/08/2021 01/07/2021 01/06/2021  WBC 4.0 - 10.5 K/uL 10.9(H) 10.2 13.5(H)  Hemoglobin 13.0 - 17.0 g/dL 11.2(L) 11.2(L) 10.7(L)  Hematocrit 39.0 - 52.0 % 35.1(L) 35.2(L) 33.5(L)  Platelets 150 - 400 K/uL 189 233 271   CMP Latest Ref Rng & Units 01/08/2021 01/07/2021 01/06/2021  Glucose 70 - 99 mg/dL 105(H) 119(H) 109(H)  BUN 8 - 23 mg/dL 31(H) 24(H) 25(H)  Creatinine 0.61 - 1.24 mg/dL 0.87 0.95 1.09  Sodium 135 - 145 mmol/L 141 141 141  Potassium 3.5 - 5.1 mmol/L 3.9 4.4 4.4  Chloride 98 - 111 mmol/L 110 107 108  CO2 22 - 32 mmol/L 25 25 25   Calcium 8.9 - 10.3 mg/dL 8.8(L) 8.8(L) 8.9  Total Protein 6.5 - 8.1 g/dL - 6.6 6.8  Total Bilirubin 0.3 - 1.2 mg/dL - 0.7 0.7  Alkaline Phos 38 - 126 U/L - 90 100  AST 15 - 41 U/L - 43(H) 41  ALT 0 - 44 U/L - 35 35     Pertinent Imagings/Other Imagings Plain films and CT images have been personally visualized and interpreted; radiology reports have been reviewed. Decision making incorporated into the Impression / Recommendations.   I have spent 60 minutes for this patient encounter including review of prior medical records with greater than 50% of time being face to face and coordination of their care.  Electronically signed by:   Rosiland Oz, MD Infectious Disease Physician [facility] Pager: [phone]

## 2021-01-08 NOTE — Progress Notes (Addendum)
Progress Note    Jeffery Rogers  WFU:[id number] DOB: 24-Mar-1932  DOA: 01/06/2021 PCP: Jeffery Pretty, MD    Brief Narrative:     Medical records reviewed and are as summarized below:  Jeffery Rogers is an 87 y.o. male with medical history significant of dementia, complete heart block status post pacemaker, HLD, persistent atrial fibrillation,, status post TAVR secondary to severe aortic stenosis.   Pulled out his foley catheter with some bleeding after that had decreased urine output.  Outpatient they try to replace Foley but could not.  He continued to have bloody drainage.  Recently treated with nitrofurantoin for UTI.  Found to have MRSA bacteremia.     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)   Acute lower UTI   HTN (hypertension)   UTI (urinary tract infection) due to urinary indwelling Foley catheter (HCC)   Sepsis (HCC)   UTI (urinary tract infection)   MRSA bacteremia -ID consult -IV vanc -echo -cards consult as he is s/p TAVR, PMK -palliative care consult placed as well  sepsis due to UTI (urinary tract infection) due to urinary indwelling Foley catheter (HCC)  -IV Rocephin    Persistent atrial fibrillation (HCC)  -not on anticoagulation  -status post pacemaker  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   Difficult to place patient No     Medical Consultants:    ID  Cards  Palliative care    Subjective:   Working with PT  Objective:    Vitals:   01/07/21 1313 01/07/21 1945 01/08/21 0520 01/08/21 0918  BP: (!) 128/99 (!) 119/92 140/90 134/80  Pulse: 92 66 81 65  Resp: 18 20 20 18   Temp: (!) 100.6 F (38.1 C) 98.1 F (36.7 C) 98.6 F (37 C) 98.4 F (36.9 C)  TempSrc: Axillary   Axillary  SpO2: 94% 94% 100% 96%  Weight:      Height:        Intake/Output Summary (Last 24 hours) at 01/08/2021 1007 Last data filed at 01/07/2021 1950 Gross per 24 hour  Intake 100 ml  Output 600 ml  Net -500 ml   Filed Weights   01/06/21 1558  Weight: 72.6 kg    Exam:  General: Appearance:    Elderly, ill appearing male in no acute distress     Lungs:      respirations unlabored  Heart:    Normal heart rate.  MS:   All extremities are intact.   Neurologic:   sleeping     Data Reviewed:   I have personally reviewed following labs and imaging studies:  Labs: Labs show  the following:   Basic Metabolic Panel: Recent Labs  Lab 01/06/21 1624 01/07/21 0700 01/08/21 0414  NA 141 141 141  K 4.4 4.4 3.9  CL 108 107 110  CO2 25 25 25   GLUCOSE 109* 119* 105*  BUN 25* 24* 31*  CREATININE 1.09 0.95 0.87  CALCIUM 8.9 8.8* 8.8*  MG  --  2.1  --   PHOS  --  3.5  --    GFR Estimated Creatinine Clearance: 61.4 mL/min (by C-G formula based on SCr of 0.87 mg/dL). Liver Function Tests: Recent Labs  Lab 01/06/21 1624 01/07/21 0700  AST 41 43*  ALT 35 35  ALKPHOS 100 90  BILITOT 0.7 0.7  PROT 6.8 6.6  ALBUMIN 2.9* 2.6*   No results for input(s): LIPASE, AMYLASE in the last 168 hours. No results for input(s): AMMONIA in the last 168 hours. Coagulation profile Recent Labs  Lab 01/06/21 1624  INR 1.2     CBC: Recent Labs  Lab 01/06/21 1624 01/07/21 0700 01/08/21 0414  WBC 13.5* 10.2 10.9*  NEUTROABS 11.7* 8.4*  --   HGB 10.7* 11.2* 11.2*  HCT 33.5* 35.2* 35.1*  MCV 96.0 96.4 94.6  PLT 271 233 189   Cardiac Enzymes: No results for input(s): CKTOTAL, CKMB, CKMBINDEX, TROPONINI in the last 168 hours. BNP (last 3 results) No results for input(s): PROBNP in the last 8760 hours. CBG: No results for input(s): GLUCAP in the last 168 hours. D-Dimer: No results for input(s): DDIMER in the last 72 hours. Hgb A1c: No results for input(s): HGBA1C in the last 72 hours. Lipid Profile: No results for input(s): CHOL, HDL, LDLCALC, TRIG, CHOLHDL, LDLDIRECT in the last 72 hours. Thyroid function studies: Recent Labs    01/07/21 1400  TSH 0.713   Anemia work up: No results for input(s): VITAMINB12, FOLATE, FERRITIN, TIBC, IRON, RETICCTPCT in the last 72 hours. Sepsis Labs: Recent Labs  Lab 01/06/21 1624 01/06/21 2021 01/07/21 0700 01/07/21 1400 01/08/21 0414  WBC 13.5*  --  10.2  --  10.9*  LATICACIDVEN  --  1.4  --  1.1  --     Microbiology Recent Results (from the past 240 hour(s))  Culture, blood (routine x 2)     Status: Abnormal (Preliminary result)   Collection Time: 01/06/21  8:35 PM   Specimen: BLOOD  Result Value Ref Range Status   Specimen Description BLOOD RIGHT ANTECUBITAL  Final   Special Requests   Final    BOTTLES DRAWN AEROBIC AND ANAEROBIC Blood Culture adequate volume   Culture  Setup Time   Final    GRAM POSITIVE COCCI IN CLUSTERS IN BOTH AEROBIC AND ANAEROBIC BOTTLES Organism ID to follow CRITICAL RESULT CALLED TO, READ BACK BY AND VERIFIED WITH: Jeffery Rogers, AT 1911 01/07/21 Jeffery Rogers Performed at [facility], [place] [address] [place], [place] [address]    Culture STAPHYLOCOCCUS AUREUS (A)  Final   Report Status PENDING  Incomplete  Blood Culture ID Panel (Reflexed)     Status: Abnormal   Collection Time: 01/06/21  8:35 PM  Result  Value Ref Range Status   Enterococcus faecalis NOT DETECTED NOT DETECTED Final   Enterococcus Faecium NOT DETECTED NOT DETECTED Final   Listeria monocytogenes NOT DETECTED NOT DETECTED Final   Staphylococcus species DETECTED (A) NOT DETECTED Final    Comment: CRITICAL RESULT CALLED TO, READ BACK BY AND VERIFIED WITH: Jeffery Rogers, AT 1911 01/07/21 D. VANHOOK    Staphylococcus aureus (BCID) DETECTED (  A) NOT DETECTED Final    Comment: Methicillin (oxacillin)-resistant Staphylococcus aureus (MRSA). MRSA is predictably resistant to beta-lactam antibiotics (except ceftaroline). Preferred therapy is vancomycin unless clinically contraindicated. Patient requires contact precautions if  hospitalized. CRITICAL RESULT CALLED TO, READ BACK BY AND VERIFIED WITH: Salome Holmes PHARMD, AT 1911 01/07/21 D. VANHOOK    Staphylococcus epidermidis NOT DETECTED NOT DETECTED Final   Staphylococcus lugdunensis NOT DETECTED NOT DETECTED Final   Streptococcus species NOT DETECTED NOT DETECTED Final   Streptococcus agalactiae NOT DETECTED NOT DETECTED Final   Streptococcus pneumoniae NOT DETECTED NOT DETECTED Final   Streptococcus pyogenes NOT DETECTED NOT DETECTED Final   A.calcoaceticus-baumannii NOT DETECTED NOT DETECTED Final   Bacteroides fragilis NOT DETECTED NOT DETECTED Final   Enterobacterales NOT DETECTED NOT DETECTED Final   Enterobacter cloacae complex NOT DETECTED NOT DETECTED Final   Escherichia coli NOT DETECTED NOT DETECTED Final   Klebsiella aerogenes NOT DETECTED NOT DETECTED Final   Klebsiella oxytoca NOT DETECTED NOT DETECTED Final   Klebsiella pneumoniae NOT DETECTED NOT DETECTED Final   Proteus species NOT DETECTED NOT DETECTED Final   Salmonella species NOT DETECTED NOT DETECTED Final   Serratia marcescens NOT DETECTED NOT DETECTED Final   Haemophilus influenzae NOT DETECTED NOT DETECTED Final   Neisseria meningitidis NOT DETECTED NOT DETECTED Final   Pseudomonas aeruginosa NOT DETECTED  NOT DETECTED Final   Stenotrophomonas maltophilia NOT DETECTED NOT DETECTED Final   Candida albicans NOT DETECTED NOT DETECTED Final   Candida auris NOT DETECTED NOT DETECTED Final   Candida glabrata NOT DETECTED NOT DETECTED Final   Candida krusei NOT DETECTED NOT DETECTED Final   Candida parapsilosis NOT DETECTED NOT DETECTED Final   Candida tropicalis NOT DETECTED NOT DETECTED Final   Cryptococcus neoformans/gattii NOT DETECTED NOT DETECTED Final   Meth resistant mecA/C and MREJ DETECTED (A) NOT DETECTED Final    Comment: CRITICAL RESULT CALLED TO, READ BACK BY AND VERIFIED WITH: Jeffery Rogers, AT 1911 01/07/21 Jeffery Rogers Performed at [facility], [address] [place], [place] [address]   Culture, blood (routine x 2)     Status: None (Preliminary result)   Collection Time: 01/06/21  8:38 PM   Specimen: BLOOD RIGHT FOREARM  Result Value Ref Range Status   Specimen Description BLOOD RIGHT FOREARM  Final   Special Requests   Final    BOTTLES DRAWN AEROBIC AND ANAEROBIC Blood Culture results may not be optimal due to an inadequate volume of blood received in culture bottles   Culture   Final    NO GROWTH 1 DAY Performed at [facility], [place] [address], [place], [place] [address]    Report Status PENDING  Incomplete  Resp Panel by RT-PCR (Flu A&B, Covid) Urine, Catheterized     Status: None   Collection Time: 01/06/21  9:18 PM   Specimen: Urine, Catheterized; Nasopharyngeal(NP) swabs in vial transport medium  Result Value Ref Range Status   SARS Coronavirus 2 by RT PCR NEGATIVE NEGATIVE Final    Comment: (NOTE) SARS-CoV-2 target nucleic acids are NOT DETECTED.  The SARS-CoV-2 RNA is generally detectable in upper respiratory specimens during the acute phase of infection. The lowest concentration of SARS-CoV-2 viral copies this assay can detect is 138 copies/mL. A negative result does not preclude SARS-Cov-2 infection and should not be used as the sole basis for  treatment or other patient management decisions. A negative result may occur with  improper specimen collection/handling, submission of specimen other than  nasopharyngeal swab, presence of viral mutation(s) within the areas targeted by this assay, and inadequate number of viral copies(<138 copies/mL). A negative result must be combined with clinical observations, patient history, and epidemiological information. The expected result is Negative.  Fact Sheet for Patients:  EntrepreneurPulse.com.au  Fact Sheet for Healthcare Providers:  IncredibleEmployment.be  This test is no t yet approved or cleared by the [place] FDA and  has been authorized for detection and/or diagnosis of SARS-CoV-2 by FDA under an Emergency Use Authorization (EUA). This EUA will remain  in effect (meaning this test can be used) for the duration of the COVID-19 declaration under Section 564(b)(1) of the Act, 21 U.S.C.section 360bbb-3(b)(1), unless the authorization is terminated  or revoked sooner.       Influenza A by PCR NEGATIVE NEGATIVE Final   Influenza B by PCR NEGATIVE NEGATIVE Final    Comment: (NOTE) The Xpert Xpress SARS-CoV-2/FLU/RSV plus assay is intended as an aid in the diagnosis of influenza from Nasopharyngeal swab specimens and should not be used as a sole basis for treatment. Nasal washings and aspirates are unacceptable for Xpert Xpress SARS-CoV-2/FLU/RSV testing.  Fact Sheet for Patients: EntrepreneurPulse.com.au  Fact Sheet for Healthcare Providers: IncredibleEmployment.be  This test is not yet approved or cleared by the [place] FDA and has been authorized for detection and/or diagnosis of SARS-CoV-2 by FDA under an Emergency Use Authorization (EUA). This EUA will remain in effect (meaning this test can be used) for the duration of the COVID-19 declaration under Section 564(b)(1) of the Act, 21  U.S.C. section 360bbb-3(b)(1), unless the authorization is terminated or revoked.  Performed at [facility], [place] [address] [place], [place] [address]   Urine culture     Status: Abnormal (Preliminary result)   Collection Time: 01/06/21  9:35 PM   Specimen: Urine, Random  Result Value Ref Range Status   Specimen Description URINE, RANDOM  Final   Special Requests NONE  Final   Culture (A)  Final    10,000 COLONIES/mL GRAM NEGATIVE RODS SUSCEPTIBILITIES TO FOLLOW Performed at [facility], [place] [address] [place], [place] [address]    Report Status PENDING  Incomplete    Procedures and diagnostic studies:  No results found.  Medications:   . acetaminophen  1,000 mg Oral TID  . Chlorhexidine Gluconate Cloth  6 each Topical Daily  . clonazePAM  0.5 mg Oral BID  . docusate sodium  100 mg Oral BID  . ipratropium  2 spray Each Nare QID  . mirtazapine  15 mg Oral QHS  . QUEtiapine  100 mg Oral QHS  . senna  1 tablet Oral BID  . tamsulosin  0.4 mg Oral Daily   Continuous Infusions: . vancomycin 1,500 mg (01/07/21 2026)     LOS: 1 day   Geradine Girt  [facility]   How to contact the [facility] Attending or Consulting provider [place] or covering provider during after hours [place], for this patient?  1. Check the care team in [facility] and look for a) attending/consulting [facility] provider listed and b) the [facility] team listed 2. Log into [email or web address] and use [facility]'s universal password to access. If you do not have the password, please contact the hospital operator. 3. Locate the [facility] provider you are looking for under [facility] and page to a number that you can be directly reached. 4. If you still have difficulty reaching the provider, please page the [facility] (Director on Call)  for the Hospitalists listed on amion for assistance.  01/08/2021, 10:07 AM

## 2021-01-08 NOTE — Evaluation (Signed)
Clinical/Bedside Swallow Evaluation Patient Details  Name: ABDULKAREEM Rogers MRN: [id number] Date of Birth: 11/10/1931  Today's Date: 01/08/2021 Time: SLP Start Time (ACUTE ONLY): 1342 SLP Stop Time (ACUTE ONLY): 1355 SLP Time Calculation (min) (ACUTE ONLY): 13 min  Past Medical History:  Past Medical History:  Diagnosis Date  . Arthritis    "right arm/shoulder" (02/10/2016)  . Complete heart block ([place]) 02/10/2016   a. s/p Emergency planning/management officer  . Goiter   . Hypercholesteremia   . Memory loss   . Nasal septal deviation   . Persistent atrial fibrillation ([place])   . Presence of permanent cardiac pacemaker   . 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 Severe aortic stenosis   . Tremor    Past Surgical History:  Past Surgical History:  Procedure Laterality Date  . CATARACT EXTRACTION W/ INTRAOCULAR LENS  IMPLANT, BILATERAL Bilateral ~ 2000  . EP IMPLANTABLE DEVICE N/A 02/11/2016   Procedure: Pacemaker Implant;  Surgeon: Klecker Lance, MD;  Location: [place] [facility];  Service: Cardiovascular;  Laterality: N/A;  . RIGHT/LEFT HEART CATH AND CORONARY ANGIOGRAPHY N/A 05/03/2018   Procedure: RIGHT/LEFT HEART CATH AND CORONARY ANGIOGRAPHY;  Surgeon: Burnell Blanks, MD;  Location: [place] [facility];  Service: Cardiovascular;  Laterality: N/A;  . TEE WITHOUT CARDIOVERSION N/A 05/30/2018   Procedure: TRANSESOPHAGEAL ECHOCARDIOGRAM (TEE);  Surgeon: Burnell Blanks, MD;  Location: [place];  Service: Open Heart Surgery;  Laterality: N/A;  . TONSILLECTOMY  1930s  . TRANSCATHETER AORTIC VALVE REPLACEMENT, TRANSFEMORAL N/A 05/30/2018   Procedure: TRANSCATHETER AORTIC VALVE REPLACEMENT, TRANSFEMORAL using a 75mm Edwards Sapien 3 Aortic Valve;  Surgeon: Burnell Blanks, MD;  Location: [place] [facility];  Service: Open Heart Surgery;  Laterality: N/A;  . WISDOM TOOTH  EXTRACTION  1990s   HPI:  Pt is an 87 y/o male admitted 4/5 secondary to AMS, decreased urine output and traumatic removal of foley catheter. Pt likely with UTI. PMH includes dementia, s/p pacemaker, s/p TAVR, and a fib. Clinical swallow evaluation 11/22/20 with dx of dementia-based dysphagia with recs for dysphagia 1, thin liquids.   Assessment / Plan / Recommendation Clinical Impression  Pt presents with swallow function that is mildly worse than his presentation in Feb 2022.  Per hx, dysphagia is dementia-related. He was being fed by his caregiver upon entering room.  We repositioned pt - tends to fall to right side per her report. He demonstrated intermittent oral holding of mechanical solids, requiring cues for attention and mastication.  Thin liquids elicited consistent cough response, concerning for potential aspiration.  Nectar thick liquids were consumed without concerns for airway intrusion.  Recommend downgrading diet to dysphagia 1, maintaining nectar thick liquids for now.  Swallow function and PO toleration is likely to fluctuate depending on overall medical condition - he may be able to progress back to thin liquids when acute medical crisis resolves.  SLP will follow briefly for safety/education. SLP Visit Diagnosis: Dysphagia, oropharyngeal phase (R13.12)    Aspiration Risk  Mild aspiration risk    Diet Recommendation   dysphagia 1, nectar thick liquids  Medication Administration: Crushed with puree    Other  Recommendations Oral Care Recommendations: Oral care BID   Follow up Recommendations 24 hour supervision/assistance      Frequency and Duration min 1 x/week  1 week       Prognosis Prognosis for Safe Diet  Advancement: Fair      Swallow Study   General HPI: Pt is an 87 y/o male admitted 4/5 secondary to AMS, decreased urine output and traumatic removal of foley catheter. Pt likely with UTI. PMH includes dementia, s/p pacemaker, s/p TAVR, and a fib. Clinical swallow  evaluation 11/22/20 with dx of dementia-based dysphagia with recs for dysphagia 1, thin liquids. Type of Study: Bedside Swallow Evaluation Previous Swallow Assessment: see HPI Diet Prior to this Study: Dysphagia 3 (soft);Nectar-thick liquids Temperature Spikes Noted: No Respiratory Status: Room air History of Recent Intubation: No Behavior/Cognition: Alert;Confused Oral Cavity Assessment: Within Functional Limits Oral Care Completed by SLP: No Oral Cavity - Dentition: Adequate natural dentition Vision: Impaired for self-feeding Self-Feeding Abilities: Needs assist Patient Positioning: Upright in bed Baseline Vocal Quality: Normal Volitional Cough: Cognitively unable to elicit Volitional Swallow: Unable to elicit    Oral/Motor/Sensory Function Overall Oral Motor/Sensory Function: Within functional limits   Ice Chips Ice chips: Not tested   Thin Liquid Thin Liquid: Impaired Presentation: Cup;Straw Pharyngeal  Phase Impairments: Cough - Immediate    Nectar Thick Nectar Thick Liquid: Within functional limits   Honey Thick Honey Thick Liquid: Not tested   Puree Puree: Within functional limits   Solid     Solid: Impaired Oral Phase Functional Implications: Oral holding      Jeffery Rogers 01/08/2021,1:58 PM

## 2021-01-08 NOTE — Plan of Care (Signed)
  Problem: Education: Goal: Knowledge of General Education information will improve Description: Including pain rating scale, medication(s)/side effects and non-pharmacologic comfort measures Outcome: Progressing   Problem: Health Behavior/Discharge Planning: Goal: Ability to manage health-related needs will improve Outcome: Progressing   Problem: Activity: Goal: Risk for activity intolerance will decrease Outcome: Progressing   Problem: Nutrition: Goal: Adequate nutrition will be maintained Outcome: Progressing   Problem: Coping: Goal: Level of anxiety will decrease Outcome: Progressing   Problem: Elimination: Goal: Will not experience complications related to urinary retention Outcome: Progressing   Problem: Skin Integrity: Goal: Risk for impaired skin integrity will decrease Outcome: Progressing

## 2021-01-08 NOTE — Progress Notes (Signed)
Daily Progress Note   Patient Name: Jeffery Rogers       Date: 01/08/2021 DOB: [birth year]  Age: 87 y.o. MRN#: [id number] Attending Physician: Geradine Girt, DO Primary Care Physician: [name], MD Admit Date: 01/06/2021  Reason for Consultation/Follow-up: goals of care  Subjective: 13:30--I spoke with wife/[name] and daughter/[name] at length by phone.   I reviewed Palliative Medicine as specialized medical care for people living with serious illness. It focuses on providing relief from the symptoms and stress of a serious illness.   Jeffery Rogers reports that her father has resided at [facility] since February. 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 It has been a very difficult situation and Jeffery Rogers states that he "has fluctuated in finding sources of joy".   We discussed his current illness and what it means in the larger context of his ongoing co-morbidities. Brief discussion was had regarding the diagnosis of dementia and its natural trajectory, emphasizing that it is a progressive illness that worsens over time.    Family specifically requests information/update regarding the Infectious Disease consult. Discussed that patient has Staph bacteremia, and since patient has pacemaker and h/o TAVR, these are presumed sources of infection. Discussed that cardiology will be consulted and work-up will likely include TEE.   I attempted to elicit values and goals of care important to the patient. Jeffery Rogers shares that her father is "very strong in her faith" and that her personal feeling is that he is "ready to go home".  She states her goals for her father are "comfort" and "freedom from fear".   The  difference between full scope medical intervention and comfort care was considered in light of the patient's goals of care. Family is not ready to make any decisions until work-up is complete and they have heard recommendations from cardiology. Family does seem to indicate they may have reservations about Mr. Pitkin undergoing major invasive procedures.   Jeffery Rogers shares that a complicating factor in the decision-making process is her brother, who is in healthcare and has strong opinions on certain issues. He has stated to family that "In 2022 no one should die of a treatable infection" as well as he doesn't want his father to "starve to death". Jeffery Rogers does  express however that her mother has the "final say" in all decisions.    Discussed the importance of continued conversation with family and their medical providers regarding overall plan of care and treatment options, ensuring decisions are within the context of the patients values and GOCs.  14:45--I met with Jeffery Rogers briefly at bedside. Patient is still requiring bilateral soft mittens to prevent pulling at Foley and IV, however she reports his agitation is improved since yesterday. Provided her with a copy of "hard choices book".  Length of Stay: 1  Current Medications: Scheduled Meds:  . acetaminophen  1,000 mg Oral TID  . Chlorhexidine Gluconate Cloth  6 each Topical Daily  . clonazePAM  0.5 mg Oral BID  . docusate sodium  100 mg Oral BID  . ipratropium  2 spray Each Nare QID  . mirtazapine  15 mg Oral QHS  . QUEtiapine  100 mg Oral QHS  . senna  1 tablet Oral BID  . tamsulosin  0.4 mg Oral Daily    Continuous Infusions: . vancomycin 1,500 mg (01/07/21 2026)    PRN Meds: acetaminophen **OR** acetaminophen, ALPRAZolam, morphine injection, oxyCODONE-acetaminophen, polyvinyl alcohol  Physical Exam Constitutional:      General: He is not in acute distress.    Appearance: He is ill-appearing.  Pulmonary:     Effort: Pulmonary  effort is normal.  Neurological:     Mental Status: He is alert. He is confused.     Motor: Weakness present.  Psychiatric:        Cognition and Memory: Cognition is impaired.             Vital Signs: BP 134/80 (BP Location: Right Arm)   Pulse 65   Temp 98.4 F (36.9 C) (Axillary)   Resp 18   Ht 6' 1"  (1.854 m)   Wt 72.6 kg   SpO2 96%   BMI 21.11 kg/m  SpO2: SpO2: 96 % O2 Device: O2 Device: Room Air O2 Flow Rate:    Intake/output summary:   Intake/Output Summary (Last 24 hours) at 01/08/2021 1429 Last data filed at 01/08/2021 0800 Gross per 24 hour  Intake 160 ml  Output 600 ml  Net -440 ml   LBM:   Baseline Weight: Weight: 72.6 kg Most recent weight: Weight: 72.6 kg       Palliative Assessment/Data: PPS 30%        Palliative Care Assessment & Plan   HPI/Patient Profile: 87 y.o. male  with past medical history of severe aortic stenosis s/p TAVR, complete heart block s/p [employer] pacemaker placement, atrial fibrillation, advanced Alzheimer's and vascular dementia presented to the [facility] ED on 01/06/21 from [facility] with staff complaints of patient having decreased urinary output, foley catheter problems, and AMS. Patient was recently started on antibiotics for UTI prior to ED arrival as well as pulled out his foley which caused urethral bleeding. Urology was consulted to replace foley due to difficult placement issues. Patient was admitted on 01/06/2021 with sepsis secondary to UTI, hematuria secondary to traumatic foley removal, urinary retention due to traumatic foley removal, acute metabolic encephalopathy.   Of note, patient has had one recent hospitalization and ED visit: 2/18-2/23/22 hospitalization for AMS and 2/10-2/11/22 ED visit for acute right-sided thoracic back pain.    Assessment: - sepsis secondary to UTI - MRSA bacteremia - persistent atrial fibrillation, s/p pacemaker - severe aortic stenosis, s/p TAVR - Mixed Alzheimer's and vascular  dementia - agitation  Recommendations/Plan:  Continue current medical care -  treat the treatable  DNR/DNI as previously documented  Continue tylenol 1000 mg TID for generalized discomfort  Family wants to continue ID work-up and hear recommendations from cardiology before making any decisions regarding comfort care/hospice  PMT will continue to follow  Goals of Care and Additional Recommendations:  Limitations on Scope of Treatment: Full Scope Treatment  Code Status: DNR/DNI  Prognosis:   Unable to determine  Discharge Planning:  To Be Determined  Care plan was discussed with Dr. Eliseo Squires  Thank you for allowing the Palliative Medicine Team to assist in the care of this patient.   Total Time 67 minutes Prolonged Time Billed  yes       Greater than 50%  of this time was spent counseling and coordinating care related to the above assessment and plan.  Lavena Bullion, NP  Please contact Palliative Medicine Team phone at [phone] for questions and concerns.

## 2021-01-08 NOTE — Consult Note (Signed)
Cardiology Consultation:   Patient ID: Jeffery Rogers MRN: [id number]; DOB: [birth year]  Admit date: 01/06/2021 Date of Consult: 01/08/2021  PCP:  [name], [place]  Cardiologist:  [name], MD 05/30/2018 Advanced Practice Provider:  No care team member to display Electrophysiologist:  Cristopher Peru, MD  [facility]:  [name], MD    {  Patient Profile:   Jeffery Rogers is a 87 y.o. male with a hx of CHB s/p BSci PPM 2017, severe AS s/p TAVR 2019, persistent Afib, no longer on Eliquis due to hematuria, HLD, OA, dementia, HOH, who is being seen today for the evaluation of possible SBE at the request of Dr Eliseo Squires.  History of Present Illness:   Jeffery Rogers was admitted 04/05 after he pulled on his foley and developed hematuria. He was doing a 7 day course of nitrofurantoin. His urine Cx was + MSSA. PCP saw him 04/05 for gross hematuria and no UOP. Sent to ED and admitted. ED removed foley, Urology saw and reinserted it.  + fever, pt felt septic, started on IV Rocephin. Blood Cx +MRSA >> ABX changed to Vanc w/ short course Ceftriaxone.  ID saw. Palliative Care consult pending. Cards asked to see due to hx TAVR, PPM.   Jeffery Rogers has significant dementia and requires a sitter.  He is not able to provide any concrete information.  He seems agitated and is moving constantly.  However, the caregiver says that today is better than yesterday.  He does not appear to be short of breath or in any acute distress.  He is not able to provide any further information.  Information was obtained from staff and from chart notes.  His current caregiver was not with him over the weekend in the first part of the week when he came in.  He is at [facility], and requires a Therapist, nutritional.   Past Medical History:  Diagnosis Date  . Arthritis    "right arm/shoulder" (02/10/2016)  . Complete heart block ([place]) 02/10/2016   a. s/p Mudlogger  . Goiter   . Hypercholesteremia   . Memory loss   . Nasal septal deviation   . Persistent atrial fibrillation ([place])   . Presence of permanent cardiac pacemaker   . 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 Severe aortic stenosis   . Tremor     Past Surgical History:  Procedure Laterality Date  . CATARACT EXTRACTION W/ INTRAOCULAR LENS  IMPLANT, BILATERAL Bilateral ~ 2000  . EP IMPLANTABLE DEVICE N/A 02/11/2016   Procedure: Pacemaker Implant;  Surgeon: Sanger Lance, MD;  Location: [place] [facility];  Service: Cardiovascular;  Laterality: N/A;  . RIGHT/LEFT HEART CATH AND CORONARY ANGIOGRAPHY N/A 05/03/2018   Procedure: RIGHT/LEFT HEART CATH AND CORONARY ANGIOGRAPHY;  Surgeon: Burnell Blanks, MD;  Location: [place] [facility];  Service: Cardiovascular;  Laterality: N/A;  . TEE WITHOUT CARDIOVERSION N/A 05/30/2018   Procedure: TRANSESOPHAGEAL ECHOCARDIOGRAM (TEE);  Surgeon: Burnell Blanks, MD;  Location: [place];  Service: Open Heart Surgery;  Laterality: N/A;  . TONSILLECTOMY  1930s  . TRANSCATHETER AORTIC VALVE REPLACEMENT, TRANSFEMORAL N/A 05/30/2018   Procedure: TRANSCATHETER AORTIC VALVE REPLACEMENT, TRANSFEMORAL using a 62mm Edwards Sapien 3 Aortic Valve;  Surgeon: Burnell Blanks, MD;  Location: [facility];  Service: Open Heart Surgery;  Laterality:  N/A;  . WISDOM TOOTH EXTRACTION  1990s     Home Medications:  Prior to Admission medications   Medication Sig Start Date End Date Taking? Authorizing Provider  acetaminophen (TYLENOL) 500 MG tablet Take 1,000 mg by mouth daily.   Yes [provider]  ALPRAZolam (XANAX) 0.25 MG tablet Take 0.25 mg by mouth 3 (three) times daily as needed for anxiety.   Yes [provider]  amLODipine (NORVASC) 5 MG tablet Take 1 tablet by mouth daily. Patient taking differently:  Take 5 mg by mouth daily. 04/15/20  Yes Eileen Stanford, PA-C  clonazePAM (KLONOPIN) 0.5 MG tablet Take 1 tablet (0.5 mg total) by mouth 2 (two) times daily. 12/26/20  Yes Virgie Dad, MD  ipratropium (ATROVENT) 0.03 % nasal spray Place 2 sprays into both nostrils 4 (four) times daily as needed for rhinitis.   Yes [provider]  mirtazapine (REMERON) 15 MG tablet Take 15 mg by mouth daily. 02/19/20  Yes [provider]  nitrofurantoin, macrocrystal-monohydrate, (MACROBID) 100 MG capsule Take 100 mg by mouth 2 (two) times daily. 01/06/21 01/11/21 Yes [provider]  oxyCODONE-acetaminophen (PERCOCET) 5-325 MG tablet Take 1-2 tablets by mouth every 6 (six) hours as needed. Patient taking differently: Take 1-2 tablets by mouth every 6 (six) hours as needed for moderate pain. 11/13/20  Yes Charlesetta Shanks, MD  polyvinyl alcohol (LIQUIFILM TEARS) 1.4 % ophthalmic solution Place 1 drop into both eyes 4 (four) times daily as needed for dry eyes.   Yes [provider]  QUEtiapine (SEROQUEL) 100 MG tablet Take 100 mg by mouth at bedtime.   Yes [provider]  saccharomyces boulardii (FLORASTOR) 250 MG capsule Take 250 mg by mouth 2 (two) times daily.   Yes [provider]  tamsulosin (FLOMAX) 0.4 MG CAPS capsule Take 1 capsule (0.4 mg total) by mouth daily. 12/17/20  Yes Mast, Man X, NP    Inpatient Medications: Scheduled Meds: . acetaminophen  1,000 mg Oral TID  . Chlorhexidine Gluconate Cloth  6 each Topical Daily  . clonazePAM  0.5 mg Oral BID  . docusate sodium  100 mg Oral BID  . ipratropium  2 spray Each Nare QID  . mirtazapine  15 mg Oral QHS  . QUEtiapine  100 mg Oral QHS  . senna  1 tablet Oral BID  . tamsulosin  0.4 mg Oral Daily   Continuous Infusions: . vancomycin 1,500 mg (01/07/21 2026)   PRN Meds: acetaminophen **OR** acetaminophen, ALPRAZolam, morphine injection, oxyCODONE-acetaminophen, polyvinyl alcohol  Allergies:   No  Known Allergies  Social History:   Social History   Socioeconomic History  . Marital status: Married    Spouse name: Not on file  . Number of children: 2  . Years of education: Bachelors  . Highest education level: Not on file  Occupational History  . Occupation: Retired Chief Financial Officer  Tobacco Use  . Smoking status: Former Smoker    Packs/day: 1.00    Years: 16.00    Pack years: 16.00    Types: Cigarettes  . Smokeless tobacco: Never Used  . Tobacco comment: Quit smoking cigarettes in 1971  Vaping Use  . Vaping Use: Never used  Substance and Sexual Activity  . Alcohol use: Never    Alcohol/week: 2.0 standard drinks    Types: 1 Cans of beer, 1 Shots of liquor per week  . Drug use: No  . Sexual activity: Yes  Other Topics Concern  . Not on file  Social History  Narrative   Lives at home with his wife.   Right-handed.   1 cup coffee per day.  Occasional Coke.   Social Determinants of Health   Financial Resource Strain: Not on file  Food Insecurity: Not on file  Transportation Needs: Not on file  Physical Activity: Not on file  Stress: Not on file  Social Connections: Not on file  Intimate Partner Violence: Not on file    Family History:   Family History  Problem Relation Age of Onset  . Stroke Other   . Irregular heart beat Other   . Stroke Mother   . Stroke Father   . CAD Father        MI    Family Status  Relation Name Status  . Other  (Not Specified)  . Other  (Not Specified)  . Mother  Deceased  . Father  Deceased  . MGM  Deceased  . MGF  Deceased  . PGM  Deceased  . PGF  Deceased     ROS:  Please see the history of present illness.  No other information available.  Physical Exam/Data:   Vitals:   01/07/21 1313 01/07/21 1945 01/08/21 0520 01/08/21 0918  BP: (!) 128/99 (!) 119/92 140/90 134/80  Pulse: 92 66 81 65  Resp: 18 20 20 18   Temp: (!) 100.6 F (38.1 C) 98.1 F (36.7 C) 98.6 F (37 C) 98.4 F (36.9 C)  TempSrc: Axillary   Axillary   SpO2: 94% 94% 100% 96%  Weight:      Height:        Intake/Output Summary (Last 24 hours) at 01/08/2021 1703 Last data filed at 01/08/2021 0800 Gross per 24 hour  Intake 60 ml  Output 600 ml  Net -540 ml   Last 3 Weights 01/06/2021 01/06/2021 01/05/2021  Weight (lbs) 160 lb 153 lb 1.6 oz 153 lb  Weight (kg) 72.576 kg 69.446 kg 69.4 kg     Body mass index is 21.11 kg/m.  General: Frail, elderly male, moderately agitated at baseline HEENT: normal for age Lymph: no adenopathy Neck: no JVD Endocrine:  No thryomegaly Vascular: No carotid bruits; 4/4 extremity pulses 2+    Cardiac:  normal S1, S2; RRR; 2/6 murmur  Lungs:  clear to auscultation bilaterally, no wheezing, rhonchi or rales  Abd: soft, nontender, no hepatomegaly  Ext: no edema Musculoskeletal:  No deformities, BUE and BLE strength normal and equal Skin: warm and dry, several small lesions in various stages of healing Neuro: Oriented x1, no directed responses to any questions, not able to participate in the exam Psych: Agitated affect   EKG:  The EKG was personally reviewed and demonstrates: Atrial fib, V pacing with heart rate 60, Telemetry:  Telemetry was personally reviewed and demonstrates: Atrial fib, V pacing  Relevant CV Studies:  ECHO: 11/07/2020 1. Left ventricular ejection fraction, by estimation, is 55 to 60%. The  left ventricle has normal function. The left ventricle has no regional  wall motion abnormalities. There is mild concentric left ventricular  hypertrophy. Left ventricular diastolic parameters are indeterminate.  2. Right ventricular systolic function is mildly reduced. The right  ventricular size is severely enlarged.  3. Left atrial size was severely dilated.  4. Right atrial size was severely dilated.  5. The mitral valve is degenerative. Mild to moderate mitral valve  regurgitation. Severe mitral annular calcification.  6. Tricuspid valve regurgitation is moderate, eccentric and appears to   have two jets. Eccentricity may underestimate severity.  7.  The aortic valve has been repaired/replaced. Aortic valve  regurgitation is mild to moderate. Procedure Date: 05/30/2018. Aortic valve  mean gradient measures 16.8 mmHg. Aortic valve Vmax measures 2.88 m/s.  Mild to moderate paralvalular leak seen in the  PSAX at the 2-3 O'clock position with small additional jet at the 9  o'clock position. DVI 0.43, with prosthetic aortic valve acceleration time  < 100 ms.  8. Aortic dilatation noted. There is mild dilatation of the ascending  aorta, measuring 40 mm.   Comparison(s): A prior study was performed on 11/20/2019. Prior images  reviewed side by side. Compared to prior similar paravalvular leak,  prosthetic valve measures, and ascending aorta dilation. Right ventricle  has dilated further from last study.   11/20/2019: TTE IMPRESSIONS  1. 23 mm S3 TAVR. Vmax 2.9 m/s, Mean gradient 19 mmHG, EOA 2.21 cm2, DI  0.41. Mild to moderate paravalvular leak is present in the 2-3 o'clock  position. Gradients stable compared with prior study and PVL unchanged.  Stable compared with 05/23/2019. The  aortic valve has been repaired/replaced. Aortic valve regurgitation is  mild to moderate. There is a 23 mm Edwards Sapien prosthetic (TAVR) valve  present in the aortic position. Procedure Date: 05/30/18.  2. Left ventricular ejection fraction, by estimation, is 55 to 60%. The  left ventricle has normal function. The left ventricle has no regional  wall motion abnormalities. Left ventricular diastolic function could not  be evaluated.  3. Right ventricular systolic function is mildly reduced. The right  ventricular size is moderately enlarged. There is mildly elevated  pulmonary artery systolic pressure. The estimated right ventricular  systolic pressure is 10.1 mmHg.  4. Right atrial size was severely dilated.  5. The mitral valve is degenerative. Mild mitral valve regurgitation. No   evidence of mitral stenosis.  6. Tricuspid valve regurgitation is moderate to severe.  7. There is mild dilatation of the ascending aorta measuring 41 mm.  8. The inferior vena cava is dilated in size with <50% respiratory  variability, suggesting right atrial pressure of 15 mmHg.   Comparison(s): A prior study was performed on 05/23/2019. No significant  change from prior study. EF unchanged. PVL similar in appearnce to prior  study.    05/23/2019: TTE IMPRESSIONS 1. - TAVR: A 23 mm S3 is present in the aortic position. The Vmax and gradients are increased compared with the prior study (06/29/2018). Vmax 3.3 m/s, peak/mean gradients 43/22 mmHG, AVA 1.28 cm2. There is a mild to moderate degree of paravalvular vs  central regurgitation (difficult to ascertain). The acceleration time is <100 ms, so I suspect the higher gradients possibly are related to slightly worsening regurgitaiton rather than obstruction. 2. The left ventricle has normal systolic function with an ejection fraction of 60-65%. The cavity size was normal. There is moderate concentric left ventricular hypertrophy. Left ventricular diastolic Doppler parameters are indeterminate. No evidence  of left ventricular regional wall motion abnormalities. 3. The right ventricle has low normal systolic function. The cavity was mildly enlarged. There is no increase in right ventricular wall thickness. Right ventricular systolic pressure is normal. 4. Left atrial size was severely dilated. 5. Right atrial size was severely dilated. 6. Trivial pericardial effusion is present. 7. The tricuspid valve is grossly normal. Tricuspid valve regurgitation is mild-moderate. 8. A 50mm an Wende Crease Sapien bioprosthetic aortic valve (TAVR) valve is present in the aortic position. Procedure Date: 05/30/2018 Echo findings shows no evidence of regurgitation or perivalvular leak of the aortic prosthesis.  9. The aorta is normal unless  otherwise noted. 10. Aortic valve regurgitation is mild to moderate by color flow Doppler. 11. The mitral valve is degenerative. Moderate thickening of the mitral valve leaflet. Moderate calcification of the mitral valve leaflet. There is moderate mitral annular calcification present. No evidence of mitral valve stenosis. 12. When compared to the prior study: Higher gradients noted across prosthetic aortic valve as noted above when compared with the prior study (06/29/2018).   05/03/2018: LHC  Ost Ramus to Ramus lesion is 30% stenosed.  Prox Cx to Mid Cx lesion is 30% stenosed.  Ost LAD to Prox LAD lesion is 30% stenosed.  Prox LAD to Dist LAD lesion is 20% stenosed.  LV end diastolic pressure is normal.  1. Mild non-obstructive CAD 2. Severe aortic stenosis by echo criteria. By cath the peak to peak gradient across the valve is 7 mmHg and the mean gradient is 9.1 mmHg.   Laboratory Data:  High Sensitivity Troponin:  No results for input(s): TROPONINIHS in the last 720 hours.   Chemistry Recent Labs  Lab 01/06/21 1624 01/07/21 0700 01/08/21 0414  NA 141 141 141  K 4.4 4.4 3.9  CL 108 107 110  CO2 25 25 25   GLUCOSE 109* 119* 105*  BUN 25* 24* 31*  CREATININE 1.09 0.95 0.87  CALCIUM 8.9 8.8* 8.8*  GFRNONAA >60 >60 >60  ANIONGAP 8 9 6     Recent Labs  Lab 01/06/21 1624 01/07/21 0700  PROT 6.8 6.6  ALBUMIN 2.9* 2.6*  AST 41 43*  ALT 35 35  ALKPHOS 100 90  BILITOT 0.7 0.7   Hematology Recent Labs  Lab 01/06/21 1624 01/07/21 0700 01/08/21 0414  WBC 13.5* 10.2 10.9*  RBC 3.49* 3.65* 3.71*  HGB 10.7* 11.2* 11.2*  HCT 33.5* 35.2* 35.1*  MCV 96.0 96.4 94.6  MCH 30.7 30.7 30.2  MCHC 31.9 31.8 31.9  RDW 14.6 14.6 14.5  PLT 271 233 189   BNPNo results for input(s): BNP, PROBNP in the last 168 hours.  DDimer No results for input(s): DDIMER in the last 168 hours. Lab Results  Component Value Date   TSH 0.713 01/07/2021   Lab Results  Component Value Date    HGBA1C 5.8 (H) 11/22/2020   Blood culture 01/06/2021 Enterococcus faecalis NOT DETECTED NOT DETECTED   Enterococcus Faecium NOT DETECTED NOT DETECTED   Listeria monocytogenes NOT DETECTED NOT DETECTED   Staphylococcus species NOT DETECTED DETECTEDAbnormal   Comment: CRITICAL RESULT CALLED TO, READ BACK BY AND VERIFIED WITH:  Salome Holmes PHARMD, AT 1911 01/07/21 D. VANHOOK   Staphylococcus aureus (BCID) NOT DETECTED DETECTEDAbnormal      Blood Culture 01/06/2021 Specimen Description BLOOD RIGHT ANTECUBITAL   Special Requests BOTTLES DRAWN AEROBIC AND ANAEROBIC Blood Culture adequate volume   Culture Setup Time GRAM POSITIVE COCCI IN CLUSTERS  IN BOTH AEROBIC AND ANAEROBIC BOTTLES  Organism ID to follow  CRITICAL RESULT CALLED TO, READ BACK BY AND VERIFIED WITH: Fayne Mediate, AT 1911 01/07/21 [place]  Performed at [facility], [place] [address] [place], [place] [address]   Culture STAPHYLOCOCCUS AUREUSAbnormal   Report Status PENDING         Component Value Date/Time   SDES BLOOD RIGHT FOREARM 01/08/2021 1023   SPECREQUEST  01/08/2021 1023    BOTTLES DRAWN AEROBIC ONLY Blood Culture adequate volume   CULT  01/08/2021 1023    NO GROWTH < 12 HOURS Performed at [facility], [place]  [address], [place], [place] [address]    REPTSTATUS PENDING 01/08/2021 1023   Urine Culture 01/06/2021 10,000 COLONIES/mL KLEBSIELLA PNEUMONIAE  SUSCEPTIBILITIES TO FOLLOW  Performed at [facility], [place] [address], [place], [place] [address]   Radiology/Studies:  No results found.   Assessment and Plan:   1. MSSA bacteremia: -ID has seen, they are currently recommending an echocardiogram, this has not yet been ordered, last echo was 11/07/2020 -Repeat blood cultures are negative so far -ID recommended cardiology consult for possible device extraction -Appropriately, Palliative Care has been consulted -With his advanced dementia and general frailty, would hold off on  procedures - He is not a candidate for invasive procedures such as lead extraction or any procedure on his valve -Follow-up on Palliative Care recommendations  2.  History of heart block and atrial fibrillation -He is V pacing at 60 bpm -On his most recent check, he is in VVIR mode at 60 bpm -Continue to monitor  3.  S/p TAVR -Echo results from 11/07/2020 as above -RV is significantly dilated, LV EF is normal -Aortic valve mean gradient 16.8 mmHg   Risk Assessment/Risk Scores:  CHA2DS2-VASc Score = 4  This indicates a 4.8% annual risk of stroke. The patient's score is based upon: CHF History: No HTN History: Yes Diabetes History: No Stroke History: No Vascular Disease History: Yes Age Score: 2 Gender Score: 0     Patient has been off anticoagulation since 11/2020 hospitalization due to hematuria.     For questions or updates, please contact [facility] Please consult [email or web address] for contact info under    Signed, Rosaria Ferries, PA-C  01/08/2021 5:03 PM

## 2021-01-09 ENCOUNTER — Inpatient Hospital Stay (HOSPITAL_COMMUNITY): Payer: Medicare Other

## 2021-01-09 DIAGNOSIS — I4891 Unspecified atrial fibrillation: Secondary | ICD-10-CM | POA: Diagnosis not present

## 2021-01-09 DIAGNOSIS — I350 Nonrheumatic aortic (valve) stenosis: Secondary | ICD-10-CM | POA: Diagnosis not present

## 2021-01-09 DIAGNOSIS — R7881 Bacteremia: Secondary | ICD-10-CM | POA: Diagnosis not present

## 2021-01-09 DIAGNOSIS — Z950 Presence of cardiac pacemaker: Secondary | ICD-10-CM

## 2021-01-09 DIAGNOSIS — B9562 Methicillin resistant Staphylococcus aureus infection as the cause of diseases classified elsewhere: Secondary | ICD-10-CM | POA: Diagnosis not present

## 2021-01-09 DIAGNOSIS — Z7401 Bed confinement status: Secondary | ICD-10-CM

## 2021-01-09 LAB — CULTURE, BLOOD (ROUTINE X 2): Special Requests: ADEQUATE

## 2021-01-09 LAB — ECHOCARDIOGRAM LIMITED
AV Mean grad: 18 mmHg
Area-P 1/2: 1.87 cm2
Height: 73 in
Weight: 2560 oz

## 2021-01-09 LAB — URINE CULTURE: Culture: 10000 — AB

## 2021-01-09 LAB — CK: Total CK: 680 U/L — ABNORMAL HIGH (ref 49–397)

## 2021-01-09 MED ORDER — SODIUM CHLORIDE 0.9 % IV SOLN
8.0000 mg/kg | Freq: Every day | INTRAVENOUS | Status: DC
  Administered 2021-01-09 – 2021-01-12 (×4): 600 mg via INTRAVENOUS
  Filled 2021-01-09 (×5): qty 12

## 2021-01-09 MED ORDER — RESOURCE THICKENUP CLEAR PO POWD
ORAL | Status: DC | PRN
  Filled 2021-01-09: qty 125

## 2021-01-09 NOTE — Progress Notes (Signed)
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   Assessment Community-acquired MRSA bacteremia Complete heart block status post pacemaker Severe aortic stenosis status post TAVR Advanced dementia Bedridden Urinary retention status post replacement of Foley's Goals of care  Recommendations Patient was seen by cardiology and deemed not a good candidate for TEE and re-do cardiac surgery given his advanced dementia and bedridden status.  They are recommending IV antibiotics as a palliative approach. Palliative care has been following for goals of care discussion.   Continue vancomycin, pharmacy to dose.  Will check with Pharm if Daptomycin is cost friendly Follow-up repeat blood cultures. TTE also unlikely helpful at this point and would not make any changes in his management We will plan to treat with 6 weeks of IV antibiotics from date of negative blood cultures followed by indefinite/lifelong p.o. suppression with oral antibiotics given patient is not a surgical candidate if congruent with GOC discussion Monitor CBC, BMP,  Vancomycin trough   Dr. Drucilla Schmidt is on call this weekend with questions.  Otherwise new ID team will follow on Monday  Rest of the management as per the  primary team. Thank you for the consult. Please page with pertinent questions or concerns.  ______________________________________________________________________ Subjective patient seen and examined at the bedside. Sitter at bedside. Moaning   Vitals BP (!) 144/113 (BP Location: Right Arm)   Pulse (!) 58   Temp 98.7 F (37.1 C) (Axillary)   Resp 18   Ht 6\' 1"  (1.854 m)   Wt 72.6 kg   SpO2 97%   BMI 21.11 kg/m     Physical Exam Curled up in bed, moaning Does not respond to verbal stimuli Normal lung sounds Normal heart sounds    Pertinent Microbiology Results for orders placed or performed during the hospital encounter of 01/06/21  Culture, blood (routine x 2)     Status: Abnormal (Preliminary result)   Collection Time: 01/06/21  8:35 PM   Specimen: BLOOD  Result Value Ref Range Status   Specimen Description BLOOD RIGHT ANTECUBITAL  Final   Special Requests   Final    BOTTLES DRAWN AEROBIC AND ANAEROBIC Blood Culture adequate volume   Culture  Setup Time   Final    GRAM POSITIVE COCCI IN CLUSTERS IN BOTH AEROBIC AND ANAEROBIC BOTTLES Organism ID to follow CRITICAL RESULT CALLED TO, READ BACK BY AND VERIFIED WITH: Fayne Mediate, AT 1911 01/07/21 [facility] Performed at [facility], [address] [place], [place] [address]    Culture STAPHYLOCOCCUS AUREUS (A)  Final   Report Status PENDING  Incomplete  Blood Culture ID Panel (Reflexed)     Status: Abnormal   Collection Time: 01/06/21  8:35 PM  Result Value Ref Range Status   Enterococcus faecalis  NOT DETECTED NOT DETECTED Final   Enterococcus Faecium NOT DETECTED NOT DETECTED Final   Listeria monocytogenes NOT DETECTED NOT DETECTED Final   Staphylococcus species DETECTED (A) NOT DETECTED Final    Comment: CRITICAL RESULT CALLED TO, READ BACK BY AND VERIFIED WITH: Salome Holmes PHARMD, AT 1911 01/07/21 D. VANHOOK    Staphylococcus aureus (BCID) DETECTED (A) NOT DETECTED Final    Comment: Methicillin  (oxacillin)-resistant Staphylococcus aureus (MRSA). MRSA is predictably resistant to beta-lactam antibiotics (except ceftaroline). Preferred therapy is vancomycin unless clinically contraindicated. Patient requires contact precautions if  hospitalized. CRITICAL RESULT CALLED TO, READ BACK BY AND VERIFIED WITH: Salome Holmes PHARMD, AT 1911 01/07/21 D. VANHOOK    Staphylococcus epidermidis NOT DETECTED NOT DETECTED Final   Staphylococcus lugdunensis NOT DETECTED NOT DETECTED Final   Streptococcus species NOT DETECTED NOT DETECTED Final   Streptococcus agalactiae NOT DETECTED NOT DETECTED Final   Streptococcus pneumoniae NOT DETECTED NOT DETECTED Final   Streptococcus pyogenes NOT DETECTED NOT DETECTED Final   A.calcoaceticus-baumannii NOT DETECTED NOT DETECTED Final   Bacteroides fragilis NOT DETECTED NOT DETECTED Final   Enterobacterales NOT DETECTED NOT DETECTED Final   Enterobacter cloacae complex NOT DETECTED NOT DETECTED Final   Escherichia coli NOT DETECTED NOT DETECTED Final   Klebsiella aerogenes NOT DETECTED NOT DETECTED Final   Klebsiella oxytoca NOT DETECTED NOT DETECTED Final   Klebsiella pneumoniae NOT DETECTED NOT DETECTED Final   Proteus species NOT DETECTED NOT DETECTED Final   Salmonella species NOT DETECTED NOT DETECTED Final   Serratia marcescens NOT DETECTED NOT DETECTED Final   Haemophilus influenzae NOT DETECTED NOT DETECTED Final   Neisseria meningitidis NOT DETECTED NOT DETECTED Final   Pseudomonas aeruginosa NOT DETECTED NOT DETECTED Final   Stenotrophomonas maltophilia NOT DETECTED NOT DETECTED Final   Candida albicans NOT DETECTED NOT DETECTED Final   Candida auris NOT DETECTED NOT DETECTED Final   Candida glabrata NOT DETECTED NOT DETECTED Final   Candida krusei NOT DETECTED NOT DETECTED Final   Candida parapsilosis NOT DETECTED NOT DETECTED Final   Candida tropicalis NOT DETECTED NOT DETECTED Final   Cryptococcus neoformans/gattii NOT DETECTED NOT DETECTED Final    Meth resistant mecA/C and MREJ DETECTED (A) NOT DETECTED Final    Comment: CRITICAL RESULT CALLED TO, READ BACK BY AND VERIFIED WITH: Fayne Mediate, AT 1911 01/07/21 [facility] Performed at [facility], [address] [place], [place] [address]   Culture, blood (routine x 2)     Status: None (Preliminary result)   Collection Time: 01/06/21  8:38 PM   Specimen: BLOOD RIGHT FOREARM  Result Value Ref Range Status   Specimen Description BLOOD RIGHT FOREARM  Final   Special Requests   Final    BOTTLES DRAWN AEROBIC AND ANAEROBIC Blood Culture results may not be optimal due to an inadequate volume of blood received in culture bottles   Culture   Final    NO GROWTH 2 DAYS Performed at [facility], [place] [address], [place], [place] [address]    Report Status PENDING  Incomplete  Resp Panel by RT-PCR (Flu A&B, Covid) Urine, Catheterized     Status: None   Collection Time: 01/06/21  9:18 PM   Specimen: Urine, Catheterized; Nasopharyngeal(NP) swabs in vial transport medium  Result Value Ref Range Status   SARS Coronavirus 2 by RT PCR NEGATIVE NEGATIVE Final    Comment: (NOTE) SARS-CoV-2 target nucleic acids are NOT DETECTED.  The SARS-CoV-2 RNA is generally detectable in upper  respiratory specimens during the acute phase of infection. The lowest concentration of SARS-CoV-2 viral copies this assay can detect is 138 copies/mL. A negative result does not preclude SARS-Cov-2 infection and should not be used as the sole basis for treatment or other patient management decisions. A negative result may occur with  improper specimen collection/handling, submission of specimen other than nasopharyngeal swab, presence of viral mutation(s) within the areas targeted by this assay, and inadequate number of viral copies(<138 copies/mL). A negative result must be combined with clinical observations, patient history, and epidemiological information. The expected result is Negative.  Fact  Sheet for Patients:  EntrepreneurPulse.com.au  Fact Sheet for Healthcare Providers:  IncredibleEmployment.be  This test is no t yet approved or cleared by the [place] FDA and  has been authorized for detection and/or diagnosis of SARS-CoV-2 by FDA under an Emergency Use Authorization (EUA). This EUA will remain  in effect (meaning this test can be used) for the duration of the COVID-19 declaration under Section 564(b)(1) of the Act, 21 U.S.C.section 360bbb-3(b)(1), unless the authorization is terminated  or revoked sooner.       Influenza A by PCR NEGATIVE NEGATIVE Final   Influenza B by PCR NEGATIVE NEGATIVE Final    Comment: (NOTE) The Xpert Xpress SARS-CoV-2/FLU/RSV plus assay is intended as an aid in the diagnosis of influenza from Nasopharyngeal swab specimens and should not be used as a sole basis for treatment. Nasal washings and aspirates are unacceptable for Xpert Xpress SARS-CoV-2/FLU/RSV testing.  Fact Sheet for Patients: EntrepreneurPulse.com.au  Fact Sheet for Healthcare Providers: IncredibleEmployment.be  This test is not yet approved or cleared by the [place] FDA and has been authorized for detection and/or diagnosis of SARS-CoV-2 by FDA under an Emergency Use Authorization (EUA). This EUA will remain in effect (meaning this test can be used) for the duration of the COVID-19 declaration under Section 564(b)(1) of the Act, 21 U.S.C. section 360bbb-3(b)(1), unless the authorization is terminated or revoked.  Performed at [facility], [place] [address], [place], [place] [address]   Urine culture     Status: Abnormal (Preliminary result)   Collection Time: 01/06/21  9:35 PM   Specimen: Urine, Random  Result Value Ref Range Status   Specimen Description URINE, RANDOM  Final   Special Requests NONE  Final   Culture (A)  Final    10,000 COLONIES/mL KLEBSIELLA  PNEUMONIAE SUSCEPTIBILITIES TO FOLLOW Performed at [facility], [place] [address] [place], [place] [address]    Report Status PENDING  Incomplete  Culture, blood (routine x 2)     Status: None (Preliminary result)   Collection Time: 01/08/21 10:22 AM   Specimen: BLOOD RIGHT FOREARM  Result Value Ref Range Status   Specimen Description BLOOD RIGHT FOREARM  Final   Special Requests   Final    BOTTLES DRAWN AEROBIC ONLY Blood Culture adequate volume   Culture   Final    NO GROWTH < 12 HOURS Performed at [facility], [place] [address] [place], [place] [address]    Report Status PENDING  Incomplete  Culture, blood (routine x 2)     Status: None (Preliminary result)   Collection Time: 01/08/21 10:23 AM   Specimen: BLOOD RIGHT FOREARM  Result Value Ref Range Status   Specimen Description BLOOD RIGHT FOREARM  Final   Special Requests   Final    BOTTLES DRAWN AEROBIC ONLY Blood Culture adequate volume   Culture   Final  NO GROWTH < 12 HOURS Performed at [place] [address] [place], [place] [address]    Report Status PENDING  Incomplete    Pertinent Lab. CBC Latest Ref Rng & Units 01/08/2021 01/07/2021 01/06/2021  WBC 4.0 - 10.5 K/uL 10.9(H) 10.2 13.5(H)  Hemoglobin 13.0 - 17.0 g/dL 11.2(L) 11.2(L) 10.7(L)  Hematocrit 39.0 - 52.0 % 35.1(L) 35.2(L) 33.5(L)  Platelets 150 - 400 K/uL 189 233 271   CMP Latest Ref Rng & Units 01/08/2021 01/07/2021 01/06/2021  Glucose 70 - 99 mg/dL 105(H) 119(H) 109(H)  BUN 8 - 23 mg/dL 31(H) 24(H) 25(H)  Creatinine 0.61 - 1.24 mg/dL 0.87 0.95 1.09  Sodium 135 - 145 mmol/L 141 141 141  Potassium 3.5 - 5.1 mmol/L 3.9 4.4 4.4  Chloride 98 - 111 mmol/L 110 107 108  CO2 22 - 32 mmol/L 25 25 25   Calcium 8.9 - 10.3 mg/dL 8.8(L) 8.8(L) 8.9  Total Protein 6.5 - 8.1 g/dL - 6.6 6.8  Total Bilirubin 0.3 - 1.2 mg/dL - 0.7 0.7  Alkaline Phos 38 - 126 U/L - 90 100  AST 15 - 41 U/L - 43(H) 41  ALT 0 - 44 U/L - 35 35     Pertinent Imaging  today Plain films and CT images have been personally visualized and interpreted; radiology reports have been reviewed. Decision making incorporated into the Impression / Recommendations.  I have spent approx 30 minutes for this patient encounter including review of prior medical records, coordination of care  with greater than 50% of time being face to face/counseling and discussing diagnostics/treatment plan with the patient/family.  Electronically signed by:   Rosiland Oz, MD Infectious Disease Physician [facility] Pager: [phone]

## 2021-01-09 NOTE — Progress Notes (Signed)
Daily Progress Note   Patient Name: Jeffery Rogers       Date: 01/09/2021 DOB: 08/14/1932  Age: 87 y.o. MRN#: [id number] Attending Physician: Antonieta Pert, MD Primary Care Physician: [name], MD Admit Date: 01/06/2021  Reason for Consultation/Follow-up: goals of care  Subjective: Chart reviewed. Per cardiology, patient is not a candidate for TEE and if found to have infection of TAVR or PPM, he is not a candidate for re-do of aortic valve surgery or lead extraction. Cardiology has recommended a palliative approach with IV antibiotics.   16:30--Received update from bedside RN. No acute concerns.  A bedside, patient is sleeping. [employer] care giver is at bedside.   I reviewed the shadow chart and found the MOST form completed 12/11/20.  Per MOST form, family outlined their wishes for the following treatment decisions:  Cardiopulmonary Resuscitation: Do Not Attempt Resuscitation (DNR/No CPR)  Medical Interventions: Limited Additional Interventions: Use medical treatment, IV fluids and cardiac monitoring as indicated, DO NOT USE intubation or mechanical ventilation. May consider use of less invasive airway support such as BiPAP or CPAP. Also provide comfort measures. Transfer to the hospital if indicated. Avoid intensive care.   Antibiotics: Antibiotics if indicated  IV Fluids: IV fluids if indicated  Feeding Tube: Feeding tube for a defined trial period    17:20--I spoke with wife/[name] by phone. She states she has spoken with cardiology and is aware of their recommendations. She does not have any questions at this time. She plans to take time over the weekend to discuss next steps with the family. As hospice care had been discussed previously with PMT, I did let her know that he would  not be eligible for hospice while receiving IV antibiotics but could receive outpatient palliative.   Length of Stay: 2  Current Medications: Scheduled Meds:  . acetaminophen  1,000 mg Oral TID  . Chlorhexidine Gluconate Cloth  6 each Topical Daily  . clonazePAM  0.5 mg Oral BID  . docusate sodium  100 mg Oral BID  . ipratropium  2 spray Each Nare QID  . mirtazapine  15 mg Oral QHS  . QUEtiapine  100 mg Oral QHS  . senna  1 tablet Oral BID  . tamsulosin  0.4 mg Oral Daily    Continuous Infusions: . DAPTOmycin (CUBICIN)  IV  PRN Meds: acetaminophen **OR** acetaminophen, ALPRAZolam, morphine injection, oxyCODONE-acetaminophen, polyvinyl alcohol, Resource ThickenUp Clear  Physical Exam Vitals reviewed.  Constitutional:      General: He is sleeping. He is not in acute distress.    Appearance: He is ill-appearing.  Cardiovascular:     Comments: Paced rhythm Pulmonary:     Effort: Pulmonary effort is normal.  Neurological:     Motor: Weakness present.             Vital Signs: BP 100/78   Pulse 61   Temp 99 F (37.2 C) (Axillary)   Resp 16   Ht 6\' 1"  (1.854 m)   Wt 72.6 kg   SpO2 91%   BMI 21.11 kg/m  SpO2: SpO2: 91 % O2 Device: O2 Device: Room Air O2 Flow Rate:    Intake/output summary:   Intake/Output Summary (Last 24 hours) at 01/09/2021 1630 Last data filed at 01/09/2021 1300 Gross per 24 hour  Intake 660 ml  Output 725 ml  Net -65 ml   LBM:   Baseline Weight: Weight: 72.6 kg Most recent weight: Weight: 72.6 kg       Palliative Assessment/Data: PPS 30%      Palliative Care Assessment & Plan   HPI/Patient Profile: 87 y.o. male  with past medical history of severe aortic stenosis s/p TAVR, complete heart block s/p [employer] pacemaker placement, atrial fibrillation, advanced Alzheimer's and vascular dementia presented to the [facility] ED on 01/06/21 from [facility] with staff complaints of patient having decreased urinary output, foley catheter  problems, and AMS. Patient was recently started on antibiotics for UTI prior to ED arrival as well as pulled out his foley which caused urethral bleeding. Urology was consulted to replace foley due to difficult placement issues. Patient was admitted on 01/06/2021 with sepsis secondary to UTI, hematuria secondary to traumatic foley removal, urinary retention due to traumatic foley removal, acute metabolic encephalopathy.    Of note, patient has had one recent hospitalization and ED visit: 2/18-2/23/22 hospitalization for AMS and 2/10-2/11/22 ED visit for acute right-sided thoracic back pain.    Assessment: - sepsis secondary to UTI - MRSA bacteremia - persistent atrial fibrillation, s/p pacemaker - severe aortic stenosis, s/p TAVR - Mixed Alzheimer's and vascular dementia - agitation  Recommendations/Plan:  Continue current medical care - treat the treatable  DNR/DNI as previously documented  Discussed recommendations from cardiology and Infectious Disease for IV antibiotics for 6-8 weeks; patient would not be eligible for hospice care while receiving IV antibiotics  If family opts for long-term antibiotics, would recommend outpatient palliative at discharge   MOST form located - original is on shadow chart and I have made a copy to scan into [employer]  PMT will continue to follow  Goals of Care and Additional Recommendations:  Limitations on Scope of Treatment: Full Scope Treatment and No Tracheostomy  Code Status: DNR/DNI  Prognosis:  Difficult to determine, but overall poor in the setting of advanced dementia, acute infection, and underlying co-morbid conditions  Discharge Planning:  To Be Determined  Care plan was discussed with bedside RN  Thank you for allowing the Palliative Medicine Team to assist in the care of this patient.   Total Time 35 minutes Prolonged Time Billed  no       Greater than 50%  of this time was spent counseling and coordinating care related to the  above assessment and plan.  Lavena Bullion, NP  Please contact Palliative Medicine Team phone at [phone] for  questions and concerns.

## 2021-01-09 NOTE — Progress Notes (Signed)
PHARMACY CONSULT NOTE FOR:  OUTPATIENT  PARENTERAL ANTIBIOTIC THERAPY (OPAT)  Indication: Bacteremia Regimen: Daptomycin 600mg  IV every 24 hours End date: 02/19/2021  IV antibiotic discharge orders are pended. To discharging provider:  please sign these orders via discharge navigator,  Select New Orders & click on the button choice - Manage This Unsigned Work.    Thank you for allowing pharmacy to be a part of this patient's care.  Mercy Riding, PharmD PGY1 Acute Care Pharmacy Resident

## 2021-01-09 NOTE — Progress Notes (Signed)
Cardiology Progress Note  Patient ID: Jeffery Rogers MRN: [id number] DOB: [birth year] Date of Encounter: 01/09/2021  Primary Cardiologist: [name], MD  Subjective   Chief Complaint: None, confused  HPI: No complaints.  Confused this morning.  ROS:  All other ROS reviewed and negative. Pertinent positives noted in the HPI.     Inpatient Medications  Scheduled Meds: . acetaminophen  1,000 mg Oral TID  . Chlorhexidine Gluconate Cloth  6 each Topical Daily  . clonazePAM  0.5 mg Oral BID  . docusate sodium  100 mg Oral BID  . ipratropium  2 spray Each Nare QID  . mirtazapine  15 mg Oral QHS  . QUEtiapine  100 mg Oral QHS  . senna  1 tablet Oral BID  . tamsulosin  0.4 mg Oral Daily   Continuous Infusions: . DAPTOmycin (CUBICIN)  IV     PRN Meds: acetaminophen **OR** acetaminophen, ALPRAZolam, morphine injection, oxyCODONE-acetaminophen, polyvinyl alcohol   Vital Signs   Vitals:   01/08/21 2012 01/08/21 2020 01/09/21 0538 01/09/21 1103  BP: (!) 155/64 (!) 155/64 (!) 144/113 100/78  Pulse: 62 62 (!) 58 61  Resp: 20 18 18 16   Temp: 98.3 F (36.8 C) 98.3 F (36.8 C) 98.7 F (37.1 C) 99 F (37.2 C)  TempSrc: Oral Axillary Axillary Axillary  SpO2: 98% 98% 97% 91%  Weight:      Height:        Intake/Output Summary (Last 24 hours) at 01/09/2021 1118 Last data filed at 01/09/2021 0605 Gross per 24 hour  Intake 420 ml  Output 725 ml  Net -305 ml   Last 3 Weights 01/06/2021 01/06/2021 01/05/2021  Weight (lbs) 160 lb 153 lb 1.6 oz 153 lb  Weight (kg) 72.576 kg 69.446 kg 69.4 kg      Physical Exam   Vitals:   01/08/21 2012 01/08/21 2020 01/09/21 0538 01/09/21 1103  BP: (!) 155/64 (!) 155/64 (!) 144/113 100/78  Pulse: 62 62 (!) 58 61  Resp: 20 18 18 16   Temp: 98.3 F (36.8 C) 98.3 F (36.8 C) 98.7 F (37.1 C) 99 F (37.2 C)  TempSrc: Oral Axillary Axillary Axillary  SpO2: 98% 98% 97% 91%  Weight:      Height:         Intake/Output Summary (Last 24 hours)  at 01/09/2021 1118 Last data filed at 01/09/2021 0605 Gross per 24 hour  Intake 420 ml  Output 725 ml  Net -305 ml    Last 3 Weights 01/06/2021 01/06/2021 01/05/2021  Weight (lbs) 160 lb 153 lb 1.6 oz 153 lb  Weight (kg) 72.576 kg 69.446 kg 69.4 kg    Body mass index is 21.11 kg/m.  General: Well nourished, well developed, in no acute distress Head: Atraumatic, normal size  Eyes: PEERLA, EOMI  Neck: Supple, no JVD Endocrine: No thryomegaly Cardiac: Normal S1, S2; irregular rhythm, no murmurs Lungs: Clear to auscultation bilaterally, no wheezing, rhonchi or rales  Abd: Soft, nontender, no hepatomegaly  Ext: No edema, pulses 2+ Musculoskeletal: No deformities, BUE and BLE strength normal and equal Skin: Warm and dry, no rashes   Neuro: Awake, alert, oriented to self only, not following commands  Labs  High Sensitivity Troponin:  No results for input(s): TROPONINIHS in the last 720 hours.   Cardiac EnzymesNo results for input(s): TROPONINI in the last 168 hours. No results for input(s): TROPIPOC in the last 168 hours.  Chemistry Recent Labs  Lab 01/06/21 1624 01/07/21 0700 01/08/21 0414  NA 141  141 141  K 4.4 4.4 3.9  CL 108 107 110  CO2 25 25 25   GLUCOSE 109* 119* 105*  BUN 25* 24* 31*  CREATININE 1.09 0.95 0.87  CALCIUM 8.9 8.8* 8.8*  PROT 6.8 6.6  --   ALBUMIN 2.9* 2.6*  --   AST 41 43*  --   ALT 35 35  --   ALKPHOS 100 90  --   BILITOT 0.7 0.7  --   GFRNONAA >60 >60 >60  ANIONGAP 8 9 6     Hematology Recent Labs  Lab 01/06/21 1624 01/07/21 0700 01/08/21 0414  WBC 13.5* 10.2 10.9*  RBC 3.49* 3.65* 3.71*  HGB 10.7* 11.2* 11.2*  HCT 33.5* 35.2* 35.1*  MCV 96.0 96.4 94.6  MCH 30.7 30.7 30.2  MCHC 31.9 31.8 31.9  RDW 14.6 14.6 14.5  PLT 271 233 189   BNPNo results for input(s): BNP, PROBNP in the last 168 hours.  DDimer No results for input(s): DDIMER in the last 168 hours.   Radiology  No results found.  Patient Profile  87 yo M with CHB s/p ppm, severe AS  s/p TAVR, persistent Afib (not on eliquis due to hematuria), HLD, dementia, who was admitted 01/06/2021 with urine infection and MRSA bacteremia.    Assessment & Plan   1.  MRSA bacteremia 2.  Complete heart block status post pacemaker 3.  TAVR -Admitted with MRSA bacteremia in the setting of pacemaker and TAVR.  Due to his advanced dementia he is not a candidate for pacemaker lead extraction and/or redo aortic valve surgery.  I see no need to pursue a transesophageal echocardiogram, as this would not change management. Both would be presumed to be infected if a vegetation is found.  -I do recommend antibiotics.  This should be a palliative approach.  He likely will need extended therapy.  This can be decided by infectious diseases. -Unclear if the TTE would even change management.  He is not a candidate for aggressive cardiovascular care. -I did discuss this with [name] by phone [phone]).  She was updated of my recommendations and she is in agreement.  The cardiology consult service will sign off at this time.  We are available for questions or concerns should you have them.  For questions or updates, please contact [place] Please consult [email or web address] for contact info under   Time Spent with Patient: I have spent a total of 15 minutes with patient reviewing hospital notes, telemetry, EKGs, labs and examining the patient as well as establishing an assessment and plan that was discussed with the patient.  > 50% of time was spent in direct patient care.    Signed, Addison Naegeli. Audie Box, MD, [place]  01/09/2021 11:18 AM

## 2021-01-09 NOTE — Progress Notes (Signed)
PROGRESS NOTE    Jeffery Rogers  BTD:[id number] DOB: July 06, 1932 DOA: 01/06/2021 PCP: [name], MD   Chief Complaint  Patient presents with  . Altered Mental Status  . Foley Catheter Problem  Brief Narrative: 87 year old male with multiple comorbidities with advanced dementia, complete heart block/severe aortic stenosis with TAVR and PPM in place, persistent A. Fib, chronic Foley who pulled out his Foley catheter with some bleeding and found to have decreased urine output, outpatient replacement of Foley was attempted but could not be completed, he continued to have bloody drainage and was seen in the ED.  He recently completed nitrofurantoin for UTI.  Patient was admitted 01/06/21 found to have MRSA bacteremia. ID, cardiology was consulted  Subjective: Seen and examined this morning. Patient is alert awake oriented x0, mumbling words not interactive does not follow any commands.  He is in fetal position Surveyor, quantity at the bedside   Assessment & Plan:  UTI  due to urinary indwelling Foley catheter MRSA bacteremia with severe sepsis  POA 2/2 #1: Appreciate ID input.Continue daptomycin.Difficult situation, as TAVR and pacemaker, not a candidate for TEE and not a candidate for redo aortic valve surgery or lead extraction even if if he has infection in the TAVR or pacemaker as per cardiology.  Cardiology has advised palliative approach IV antibiotics.  Appreciate ID input follow-up repeat cultures/TTE, CK level.  Await for further plan from palliative care with family meeting.  Severe aortic stenosis S/P TAVR  Complete heart block status post pacemaker Persistent atrial fibrillation: Not on anticoagulation.  Has pacemaker in place.  Monitoring telemetry until further palliative recommendation  Advanced dementia -mixed Alzheimer's and vascular dementia with behavior disturbances, oriented x0, in fetal position.  Has 24-hour care giver normally.Continue his Seroquel Remeron and  Klonopin.  Continue one-to-one caregiver at bedside.  Urinary retention status post replacement of Foley  HTN: Blood pressure acceptable.  Anemia of chronic disease hemoglobin stable 11 gm  Diet Order            DIET DYS 2 Room service appropriate? No; Fluid consistency: Nectar Thick  Diet effective now                 Nutrition Problem: Inadequate oral intake Etiology: chronic illness (Alzheimer's/dementia) Signs/Symptoms: per patient/family report Interventions: MVI,Magic cup,Other (Comment),Refer to RD note for recommendations Patient's Body mass index is 21.11 kg/m.  DVT prophylaxis: SCDs Start: 01/07/21 0023 Code Status:   Code Status: DNR Family Communication: plan of care discussed with nursing staff and multidisciplinary meeting.  No family at the bedside.   Awaiting palliative care plan.    Status is: Inpatient Remains inpatient appropriate because:IV treatments appropriate due to intensity of illness or inability to take PO and Inpatient level of care appropriate due to severity of illness  Dispo: The patient is from: [facility]              Anticipated d/c is to: SNF              Patient currently is not medically stable to d/c.   Difficult to place patient No  Unresulted Labs (From admission, onward)          Start     Ordered   01/16/21 0500  CK  Weekly,   R     Question:  Specimen collection method  Answer:  Lab=Lab collect   01/09/21 0855   01/09/21 0855  CK  Once,   R  Question:  Specimen collection method  Answer:  Lab=Lab collect   01/09/21 0855         Medications reviewed: Scheduled Meds: . acetaminophen  1,000 mg Oral TID  . Chlorhexidine Gluconate Cloth  6 each Topical Daily  . clonazePAM  0.5 mg Oral BID  . docusate sodium  100 mg Oral BID  . ipratropium  2 spray Each Nare QID  . mirtazapine  15 mg Oral QHS  . QUEtiapine  100 mg Oral QHS  . senna  1 tablet Oral BID  . tamsulosin  0.4 mg Oral Daily   Continuous Infusions: .  DAPTOmycin (CUBICIN)  IV      Consultants:see note  Procedures:see note  Antimicrobials: Anti-infectives (From admission, onward)   Start     Dose/Rate Route Frequency Ordered Stop   01/09/21 2000  DAPTOmycin (CUBICIN) 600 mg in sodium chloride 0.9 % IVPB        8 mg/kg  72.6 kg 124 mL/hr over 30 Minutes Intravenous Daily 01/09/21 0854     01/07/21 2015  vancomycin (VANCOREADY) IVPB 1500 mg/300 mL  Status:  Discontinued        1,500 mg 150 mL/hr over 120 Minutes Intravenous Every 24 hours 01/07/21 1923 01/09/21 0854   01/07/21 1600  cefTRIAXone (ROCEPHIN) 1 g in sodium chloride 0.9 % 100 mL IVPB  Status:  Discontinued        1 g 200 mL/hr over 30 Minutes Intravenous Every 24 hours 01/07/21 0022 01/07/21 1932   01/06/21 2030  cefTRIAXone (ROCEPHIN) 1 g in sodium chloride 0.9 % 100 mL IVPB        1 g 200 mL/hr over 30 Minutes Intravenous  Once 01/06/21 2019 01/06/21 2102     Culture/Microbiology    Component Value Date/Time   SDES BLOOD RIGHT FOREARM 01/08/2021 1023   SPECREQUEST  01/08/2021 1023    BOTTLES DRAWN AEROBIC ONLY Blood Culture adequate volume   CULT  01/08/2021 1023    NO GROWTH < 24 HOURS Performed at [facility], [place] [address] [place], [place] [address]    REPTSTATUS PENDING 01/08/2021 1023    Other culture-see note  Objective: Vitals: Today's Vitals   01/08/21 1701 01/08/21 2012 01/08/21 2020 01/09/21 0538  BP: 135/84 (!) 155/64 (!) 155/64 (!) 144/113  Pulse: 74 62 62 (!) 58  Resp: 18 20 18 18   Temp: 98.6 F (37 C) 98.3 F (36.8 C) 98.3 F (36.8 C) 98.7 F (37.1 C)  TempSrc: Axillary Oral Axillary Axillary  SpO2: 98% 98% 98% 97%  Weight:      Height:      PainSc:        Intake/Output Summary (Last 24 hours) at 01/09/2021 1004 Last data filed at 01/09/2021 0605 Gross per 24 hour  Intake 420 ml  Output 725 ml  Net -305 ml   Filed Weights   01/06/21 1558  Weight: 72.6 kg   Weight change:   Intake/Output from previous day: 04/07  0701 - 04/08 0700 In: 480 [P.O.:180; IV Piggyback:300] Out: 725 [Urine:725] Intake/Output this shift: No intake/output data recorded. Filed Weights   01/06/21 1558  Weight: 72.6 kg    Examination: General exam: Alert awake demented confused oriented  X0, elderly in fetal position  HEENT:Oral mucosa moist, Ear/Nose WNL grossly,dentition normal. Respiratory system: bilaterally diminished,no use of accessory muscle, non tender. Cardiovascular system: S1 & S2 +, regular, No JVD. Gastrointestinal system: Abdomen soft, NT,ND, BS+. Nervous System:Alert, awake, moving extremities in fetal position  Extremities: No edema, distal peripheral pulses palpable.  Skin: No rashes,no icterus. MSK: Normal muscle bulk,tone, power  Data Reviewed: I have personally reviewed following labs and imaging studies CBC: Recent Labs  Lab 01/06/21 1624 01/07/21 0700 01/08/21 0414  WBC 13.5* 10.2 10.9*  NEUTROABS 11.7* 8.4*  --   HGB 10.7* 11.2* 11.2*  HCT 33.5* 35.2* 35.1*  MCV 96.0 96.4 94.6  PLT 271 233 578   Basic Metabolic Panel: Recent Labs  Lab 01/06/21 1624 01/07/21 0700 01/08/21 0414  NA 141 141 141  K 4.4 4.4 3.9  CL 108 107 110  CO2 25 25 25   GLUCOSE 109* 119* 105*  BUN 25* 24* 31*  CREATININE 1.09 0.95 0.87  CALCIUM 8.9 8.8* 8.8*  MG  --  2.1  --   PHOS  --  3.5  --    GFR: Estimated Creatinine Clearance: 61.4 mL/min (by C-G formula based on SCr of 0.87 mg/dL). Liver Function Tests: Recent Labs  Lab 01/06/21 1624 01/07/21 0700  AST 41 43*  ALT 35 35  ALKPHOS 100 90  BILITOT 0.7 0.7  PROT 6.8 6.6  ALBUMIN 2.9* 2.6*   No results for input(s): LIPASE, AMYLASE in the last 168 hours. No results for input(s): AMMONIA in the last 168 hours. Coagulation Profile: Recent Labs  Lab 01/06/21 1624  INR 1.2   Cardiac Enzymes: No results for input(s): CKTOTAL, CKMB, CKMBINDEX, TROPONINI in the last 168 hours. BNP (last 3 results) No results for input(s): PROBNP in the last  8760 hours. HbA1C: No results for input(s): HGBA1C in the last 72 hours. CBG: No results for input(s): GLUCAP in the last 168 hours. Lipid Profile: No results for input(s): CHOL, HDL, LDLCALC, TRIG, CHOLHDL, LDLDIRECT in the last 72 hours. Thyroid Function Tests: Recent Labs    01/07/21 1400  TSH 0.713   Anemia Panel: No results for input(s): VITAMINB12, FOLATE, FERRITIN, TIBC, IRON, RETICCTPCT in the last 72 hours. Sepsis Labs: Recent Labs  Lab 01/06/21 2021 01/07/21 1400  LATICACIDVEN 1.4 1.1    Recent Results (from the past 240 hour(s))  Culture, blood (routine x 2)     Status: Abnormal   Collection Time: 01/06/21  8:35 PM   Specimen: BLOOD  Result Value Ref Range Status   Specimen Description BLOOD RIGHT ANTECUBITAL  Final   Special Requests   Final    BOTTLES DRAWN AEROBIC AND ANAEROBIC Blood Culture adequate volume   Culture  Setup Time   Final    GRAM POSITIVE COCCI IN CLUSTERS IN BOTH AEROBIC AND ANAEROBIC BOTTLES Organism ID to follow CRITICAL RESULT CALLED TO, READ BACK BY AND VERIFIED WITH: Fayne Mediate, AT 1911 01/07/21 Rush [facility]mark Performed at [facility], [place] [address], [place], [place] [address]    Culture METHICILLIN RESISTANT STAPHYLOCOCCUS AUREUS (A)  Final   Report Status 01/09/2021 FINAL  Final   Organism ID, Bacteria METHICILLIN RESISTANT STAPHYLOCOCCUS AUREUS  Final      Susceptibility   Methicillin resistant staphylococcus aureus - MIC*    CIPROFLOXACIN >=8 RESISTANT Resistant     ERYTHROMYCIN >=8 RESISTANT Resistant     GENTAMICIN <=0.5 SENSITIVE Sensitive     OXACILLIN >=4 RESISTANT Resistant     TETRACYCLINE <=1 SENSITIVE Sensitive     VANCOMYCIN <=0.5 SENSITIVE Sensitive     TRIMETH/SULFA >=320 RESISTANT Resistant     CLINDAMYCIN <=0.25 SENSITIVE Sensitive     RIFAMPIN <=0.5 SENSITIVE Sensitive     Inducible Clindamycin NEGATIVE Sensitive     *  METHICILLIN RESISTANT STAPHYLOCOCCUS AUREUS  Blood Culture ID Panel (Reflexed)      Status: Abnormal   Collection Time: 01/06/21  8:35 PM  Result Value Ref Range Status   Enterococcus faecalis NOT DETECTED NOT DETECTED Final   Enterococcus Faecium NOT DETECTED NOT DETECTED Final   Listeria monocytogenes NOT DETECTED NOT DETECTED Final   Staphylococcus species DETECTED (A) NOT DETECTED Final    Comment: CRITICAL RESULT CALLED TO, READ BACK BY AND VERIFIED WITH: Salome Holmes PHARMD, AT 1911 01/07/21 D. VANHOOK    Staphylococcus aureus (BCID) DETECTED (A) NOT DETECTED Final    Comment: Methicillin (oxacillin)-resistant Staphylococcus aureus (MRSA). MRSA is predictably resistant to beta-lactam antibiotics (except ceftaroline). Preferred therapy is vancomycin unless clinically contraindicated. Patient requires contact precautions if  hospitalized. CRITICAL RESULT CALLED TO, READ BACK BY AND VERIFIED WITH: Salome Holmes PHARMD, AT 1911 01/07/21 D. VANHOOK    Staphylococcus epidermidis NOT DETECTED NOT DETECTED Final   Staphylococcus lugdunensis NOT DETECTED NOT DETECTED Final   Streptococcus species NOT DETECTED NOT DETECTED Final   Streptococcus agalactiae NOT DETECTED NOT DETECTED Final   Streptococcus pneumoniae NOT DETECTED NOT DETECTED Final   Streptococcus pyogenes NOT DETECTED NOT DETECTED Final   A.calcoaceticus-baumannii NOT DETECTED NOT DETECTED Final   Bacteroides fragilis NOT DETECTED NOT DETECTED Final   Enterobacterales NOT DETECTED NOT DETECTED Final   Enterobacter cloacae complex NOT DETECTED NOT DETECTED Final   Escherichia coli NOT DETECTED NOT DETECTED Final   Klebsiella aerogenes NOT DETECTED NOT DETECTED Final   Klebsiella oxytoca NOT DETECTED NOT DETECTED Final   Klebsiella pneumoniae NOT DETECTED NOT DETECTED Final   Proteus species NOT DETECTED NOT DETECTED Final   Salmonella species NOT DETECTED NOT DETECTED Final   Serratia marcescens NOT DETECTED NOT DETECTED Final   Haemophilus influenzae NOT DETECTED NOT DETECTED Final   Neisseria meningitidis NOT  DETECTED NOT DETECTED Final   Pseudomonas aeruginosa NOT DETECTED NOT DETECTED Final   Stenotrophomonas maltophilia NOT DETECTED NOT DETECTED Final   Candida albicans NOT DETECTED NOT DETECTED Final   Candida auris NOT DETECTED NOT DETECTED Final   Candida glabrata NOT DETECTED NOT DETECTED Final   Candida krusei NOT DETECTED NOT DETECTED Final   Candida parapsilosis NOT DETECTED NOT DETECTED Final   Candida tropicalis NOT DETECTED NOT DETECTED Final   Cryptococcus neoformans/gattii NOT DETECTED NOT DETECTED Final   Meth resistant mecA/C and MREJ DETECTED (A) NOT DETECTED Final    Comment: CRITICAL RESULT CALLED TO, READ BACK BY AND VERIFIED WITH: Fayne Mediate, AT 1911 01/07/21 Rush Landmark Performed at [facility], [address] [place], [place] [address]   Culture, blood (routine x 2)     Status: None (Preliminary result)   Collection Time: 01/06/21  8:38 PM   Specimen: BLOOD RIGHT FOREARM  Result Value Ref Range Status   Specimen Description BLOOD RIGHT FOREARM  Final   Special Requests   Final    BOTTLES DRAWN AEROBIC AND ANAEROBIC Blood Culture results may not be optimal due to an inadequate volume of blood received in culture bottles   Culture   Final    NO GROWTH 3 DAYS Performed at [facility], [place] [address] [place], [place] [address]    Report Status PENDING  Incomplete  Resp Panel by RT-PCR (Flu A&B, Covid) Urine, Catheterized     Status: None   Collection Time: 01/06/21  9:18 PM   Specimen: Urine, Catheterized; Nasopharyngeal(NP) swabs in vial transport medium  Result Value Ref  Range Status   SARS Coronavirus 2 by RT PCR NEGATIVE NEGATIVE Final    Comment: (NOTE) SARS-CoV-2 target nucleic acids are NOT DETECTED.  The SARS-CoV-2 RNA is generally detectable in upper respiratory specimens during the acute phase of infection. The lowest concentration of SARS-CoV-2 viral copies this assay can detect is 138 copies/mL. A negative result does not preclude  SARS-Cov-2 infection and should not be used as the sole basis for treatment or other patient management decisions. A negative result may occur with  improper specimen collection/handling, submission of specimen other than nasopharyngeal swab, presence of viral mutation(s) within the areas targeted by this assay, and inadequate number of viral copies(<138 copies/mL). A negative result must be combined with clinical observations, patient history, and epidemiological information. The expected result is Negative.  Fact Sheet for Patients:  EntrepreneurPulse.com.au  Fact Sheet for Healthcare Providers:  IncredibleEmployment.be  This test is no t yet approved or cleared by the [place] FDA and  has been authorized for detection and/or diagnosis of SARS-CoV-2 by FDA under an Emergency Use Authorization (EUA). This EUA will remain  in effect (meaning this test can be used) for the duration of the COVID-19 declaration under Section 564(b)(1) of the Act, 21 U.S.C.section 360bbb-3(b)(1), unless the authorization is terminated  or revoked sooner.       Influenza A by PCR NEGATIVE NEGATIVE Final   Influenza B by PCR NEGATIVE NEGATIVE Final    Comment: (NOTE) The Xpert Xpress SARS-CoV-2/FLU/RSV plus assay is intended as an aid in the diagnosis of influenza from Nasopharyngeal swab specimens and should not be used as a sole basis for treatment. Nasal washings and aspirates are unacceptable for Xpert Xpress SARS-CoV-2/FLU/RSV testing.  Fact Sheet for Patients: EntrepreneurPulse.com.au  Fact Sheet for Healthcare Providers: IncredibleEmployment.be  This test is not yet approved or cleared by the [place] FDA and has been authorized for detection and/or diagnosis of SARS-CoV-2 by FDA under an Emergency Use Authorization (EUA). This EUA will remain in effect (meaning this test can be used) for the duration of  the COVID-19 declaration under Section 564(b)(1) of the Act, 21 U.S.C. section 360bbb-3(b)(1), unless the authorization is terminated or revoked.  Performed at [facility], [place] [address], [place], [place] [address]   Urine culture     Status: Abnormal (Preliminary result)   Collection Time: 01/06/21  9:35 PM   Specimen: Urine, Random  Result Value Ref Range Status   Specimen Description URINE, RANDOM  Final   Special Requests NONE  Final   Culture (A)  Final    10,000 COLONIES/mL KLEBSIELLA PNEUMONIAE SUSCEPTIBILITIES TO FOLLOW Performed at [facility], [place] [address] [place], [place] [address]    Report Status PENDING  Incomplete  Culture, blood (routine x 2)     Status: None (Preliminary result)   Collection Time: 01/08/21 10:22 AM   Specimen: BLOOD RIGHT FOREARM  Result Value Ref Range Status   Specimen Description BLOOD RIGHT FOREARM  Final   Special Requests   Final    BOTTLES DRAWN AEROBIC ONLY Blood Culture adequate volume   Culture   Final    NO GROWTH < 24 HOURS Performed at [facility], [place] [address], [place],  [address]    Report Status PENDING  Incomplete  Culture, blood (routine x 2)     Status: None (Preliminary result)   Collection Time: 01/08/21 10:23 AM   Specimen: BLOOD RIGHT FOREARM  Result Value Ref Range Status  Specimen Description BLOOD RIGHT FOREARM  Final   Special Requests   Final    BOTTLES DRAWN AEROBIC ONLY Blood Culture adequate volume   Culture   Final    NO GROWTH < 24 HOURS Performed at [facility], [address], [place], [place] [address]    Report Status PENDING  Incomplete     Radiology Studies: No results found.   LOS: 2 days   Antonieta Pert, MD [facility]  01/09/2021, 10:04 AM

## 2021-01-09 NOTE — Progress Notes (Signed)
  Speech Language Pathology Treatment: Dysphagia  Patient Details Name: Jeffery Rogers MRN: [id number] DOB: 14-Jul-1932 Today's Date: 01/09/2021 Time: 3762-8315 SLP Time Calculation (min) (ACUTE ONLY): 16 min  Assessment / Plan / Recommendation Clinical Impression  Pt alert, talking and pleasantly confused; mitt restraints in place.  His wife and caregiver were present.  With mitt removed, he was able to hold his cup and drink with assist for stability. Nectar thick liquids continue to be tolerated with no anterior spillage and no s/s of aspiration. Thin liquids continue to evoke consistent coughing, suggesting potential aspiration. Per record review from [facility], orders from that facility indicate he was on nectar thick liquids immediately prior to this admission.  He benefited from multimodal cues and self-feeding to maximize sensory input for safety. Discussed benefits of self-feeding when able with Mrs. Mandell and caregiver, Santiago Glad.  Recommend continuing dysphagia 1 diet with nectars.  SLP to follow for safety/education.   HPI HPI: Pt is an 87 y/o male admitted 4/5 secondary to AMS, decreased urine output and traumatic removal of foley catheter. Pt likely with UTI. PMH includes dementia, s/p pacemaker, s/p TAVR, and a fib. Clinical swallow evaluation 11/22/20 with dx of dementia-based dysphagia with recs for dysphagia 1, thin liquids. Records from SNF indicate that he was on nectar thick liquids PTA.      SLP Plan  Continue with current plan of care       Recommendations  Diet recommendations: Dysphagia 1 (puree);Nectar-thick liquid Liquids provided via: Cup Medication Administration: Crushed with puree Supervision: Full supervision/cueing for compensatory strategies Compensations: Minimize environmental distractions;Slow rate;Small sips/bites                Oral Care Recommendations: Oral care BID Follow up Recommendations: 24 hour supervision/assistance SLP Visit Diagnosis:  Dysphagia, oropharyngeal phase (R13.12) Plan: Continue with current plan of care       GO                [name] 01/09/2021, 4:15 PM  Clarke Peretz L. Tivis Ringer, [place] Office number [phone] Pager [phone]

## 2021-01-09 NOTE — Progress Notes (Signed)
Initial Nutrition Assessment  DOCUMENTATION CODES:  Not applicable  INTERVENTION:   Magic cup TID with meals, each supplement provides 290 kcal and 9 grams of protein  Mighty Shake po TID, each supplement provides 330 kcal and 9 grams of protein  MVI with minerals daily  Monitor for results of [place] meeting. If PO intake does not improve and if pt is to continue receiving full scope of treatment, recommend initiation of enteral nutrition support.   NUTRITION DIAGNOSIS:  Inadequate oral intake related to chronic illness (Alzheimer's/dementia) as evidenced by per patient/family report.  GOAL:  Patient will meet greater than or equal to 90% of their needs  MONITOR:  PO intake,Supplement acceptance,Diet advancement,Weight trends,Labs,I & O's  REASON FOR ASSESSMENT:  Consult Assessment of nutrition requirement/status  ASSESSMENT:  Pt from LTC admitted with sepsis 2/2 UTI 2/2 indwelling Foley catheter and was found to have MRSA bacteremia. PMH includes advanced Alzheimer's and vascular dementia, complete heart block s/p pacemaker, HLD, Afib, s/p TAVR 2/2 severe aortic stenosis.  Pt unavailable at time of visit. PMT is following and, per Palliative, pt's speech is mostly unintelligible. Palliative also noted that pt was making non-verbal gestures of discomfort. Per PMT's notes, pt's caregiver reported that pt was eating well until last week, but notes that pt has had an overall decline over the last 3 weeks. Also noted that pt is mostly confined to a wheelchair at baseline.   Decision regarding [place] pending.    No PO intake documented.  Reviewed weight history. Per weight readings, pt weighed 78 kg in Feb 2022 and now weighs 72.6 kg. Suspect pt has experienced significant weight loss as this would indicate a 6.76% weight loss x 2 months; however, admission weight appears to have been estimated rather than measured, so unsure at this time if this is accurate. Recommend obtaining measured  weight to truly assess significance of weight changes.   Suspect pt meets criteria for malnutrition in the setting of chronic illness; however, unable to diagnose at this time without detailed diet/weight history and/or nutrition-focused physical exam.  UOP: 772ml x24 hours  Medications: colace, remeron, senokot Labs reviewed.   Diet Order:   Diet Order            DIET DYS 2 Room service appropriate? No; Fluid consistency: Nectar Thick  Diet effective now                EDUCATION NEEDS:  No education needs have been identified at this time  Skin:  Skin Assessment: Reviewed RN Assessment  Last BM:  PTA  Height:  Ht Readings from Last 1 Encounters:  01/06/21 6\' 1"  (1.854 m)   Weight:  Wt Readings from Last 1 Encounters:  01/06/21 72.6 kg   BMI:  Body mass index is 21.11 kg/m.  Estimated Nutritional Needs:  Kcal:  1900-2100 Protein:  95-105 grams Fluid:  >1.9L/d    Larkin Ina, MS, RD, LDN RD pager number and weekend/on-call pager number located in [place].

## 2021-01-09 NOTE — Progress Notes (Signed)
*  PRELIMINARY RESULTS* Echocardiogram 2D Echocardiogram LIMITED has been performed.  Leavy Cella 01/09/2021, 12:19 PM

## 2021-01-09 NOTE — Plan of Care (Signed)

## 2021-01-10 DIAGNOSIS — A4102 Sepsis due to Methicillin resistant Staphylococcus aureus: Secondary | ICD-10-CM

## 2021-01-10 DIAGNOSIS — G9341 Metabolic encephalopathy: Secondary | ICD-10-CM

## 2021-01-10 LAB — BASIC METABOLIC PANEL
Anion gap: 8 (ref 5–15)
BUN: 30 mg/dL — ABNORMAL HIGH (ref 8–23)
CO2: 24 mmol/L (ref 22–32)
Calcium: 8.9 mg/dL (ref 8.9–10.3)
Chloride: 112 mmol/L — ABNORMAL HIGH (ref 98–111)
Creatinine, Ser: 0.77 mg/dL (ref 0.61–1.24)
GFR, Estimated: >60 mL/min (ref >60)
Glucose, Bld: 107 mg/dL — ABNORMAL HIGH (ref 70–99)
Potassium: 3.5 mmol/L (ref 3.5–5.1)
Sodium: 144 mmol/L (ref 135–145)

## 2021-01-10 LAB — CBC
HCT: 39.9 % (ref 39.0–52.0)
Hemoglobin: 12.8 g/dL — ABNORMAL LOW (ref 13.0–17.0)
MCH: 30 pg (ref 26.0–34.0)
MCHC: 32.1 g/dL (ref 30.0–36.0)
MCV: 93.4 fL (ref 80.0–100.0)
Platelets: 240 10*3/uL (ref 150–400)
RBC: 4.27 MIL/uL (ref 4.22–5.81)
RDW: 14.2 % (ref 11.5–15.5)
WBC: 9.7 10*3/uL (ref 4.0–10.5)
nRBC: 0 % (ref 0.0–0.2)

## 2021-01-10 MED ORDER — ALPRAZOLAM 0.5 MG PO TABS
0.5000 mg | ORAL_TABLET | Freq: Three times a day (TID) | ORAL | Status: DC | PRN
  Administered 2021-01-11 (×2): 0.5 mg via ORAL
  Filled 2021-01-10 (×2): qty 1

## 2021-01-10 NOTE — Progress Notes (Signed)
Daily Progress Note   Patient Name: Jeffery Rogers       Date: 01/10/2021 DOB: Dec 04, 1931  Age: 87 y.o. MRN#: [id number] Attending Physician: Antonieta Pert, MD Primary Care Physician: [name], MD Admit Date: 01/06/2021  Reason for Consultation/Follow-up: Establishing goals of care and Non pain symptom management  Subjective: Chart review performed. Received report from primary RN - no acute concerns. Reports patient is still agitated and restless - asks if there is something else that can help. RN states xanax ordered at 0.25mg  does not seem to help.   Went to visit patient at bedside - 1:1 personal caregiver present. Patient was lying in bed awake, alert, disoriented, restless, and unable to participate in conversation. He does not acknowledge my presence. Personal caregiver was feeding patient dinner - he ate 100% of dinner. Patient is still requiring mitts due to pulling at catheter.   Called wife/Jeffery Rogers to offer updates and support. Therapeutic listening provided to [facility]. She requests PMT call back tomorrow for further discussion on plan of care as now was not a good time.   All questions and concerns addressed. Encouraged to call with questions and/or concerns. PMT card previously provided.  Length of Stay: 3  Current Medications: Scheduled Meds:  . acetaminophen  1,000 mg Oral TID  . Chlorhexidine Gluconate Cloth  6 each Topical Daily  . clonazePAM  0.5 mg Oral BID  . docusate sodium  100 mg Oral BID  . ipratropium  2 spray Each Nare QID  . mirtazapine  15 mg Oral QHS  . QUEtiapine  100 mg Oral QHS  . senna  1 tablet Oral BID  . tamsulosin  0.4 mg Oral Daily    Continuous Infusions: . DAPTOmycin (CUBICIN)  IV Stopped (01/09/21 2133)    PRN Meds: acetaminophen **OR**  acetaminophen, ALPRAZolam, morphine injection, oxyCODONE-acetaminophen, polyvinyl alcohol, Resource ThickenUp Clear  Physical Exam Vitals and nursing note reviewed.  Constitutional:      General: He is not in acute distress.    Appearance: He is ill-appearing.  Pulmonary:     Effort: No respiratory distress.  Skin:    General: Skin is warm and dry.  Neurological:     Mental Status: He is alert. He is disoriented and confused.     Motor: Weakness present.  Psychiatric:  Attention and Perception: He is inattentive.        Mood and Affect: Mood is anxious.        Behavior: Behavior is hyperactive.        Cognition and Memory: Cognition is impaired. Memory is impaired.             Vital Signs: BP 106/78 (BP Location: Right Arm)   Pulse (!) 59   Temp 98.4 F (36.9 C) (Oral)   Resp 18   Ht 6\' 1"  (1.854 m)   Wt 72.5 kg   SpO2 100%   BMI 21.09 kg/m  SpO2: SpO2: 100 % O2 Device: O2 Device: Room Air O2 Flow Rate:    Intake/output summary:   Intake/Output Summary (Last 24 hours) at 01/10/2021 1658 Last data filed at 01/10/2021 1016 Gross per 24 hour  Intake 182 ml  Output 1025 ml  Net -843 ml   LBM:   Baseline Weight: Weight: 72.6 kg Most recent weight: Weight: 72.5 kg       Palliative Assessment/Data: PPS 30%    Flowsheet Rows   Flowsheet Row Most Recent Value  Intake Tab   Referral Department Hospitalist  Unit at Time of Referral ER  Palliative Care Primary Diagnosis Neurology  Date Notified 01/06/21  Palliative Care Type New Palliative care  Reason for referral Clarify Goals of Care  Date of Admission 01/06/21  Date first seen by Palliative Care 01/07/21  # of days Palliative referral response time 1 Day(s)  # of days IP prior to Palliative referral 0  Clinical Assessment   Psychosocial & Spiritual Assessment   Palliative Care Outcomes   Patient/Family meeting held? Yes  Who was at the meeting? wife  [scheduled additional meeting with wife and  daughter]  Palliative Care Outcomes Improved non-pain symptom therapy, Clarified goals of care, Counseled regarding hospice, Provided psychosocial or spiritual support  Patient/Family wishes: Interventions discontinued/not started  Transfer out of ICU, PEG, Vasopressors, Tube feedings/TPN, BiPAP      Patient Active Problem List   Diagnosis Date Noted  . Sepsis due to methicillin resistant Staphylococcus aureus (MRSA) ([place]) 01/10/2021  . UTI (urinary tract infection) 01/07/2021  . Sepsis ([place]) 01/06/2021  . UTI (urinary tract infection) due to urinary indwelling Foley catheter ([place]) 01/05/2021  . Leukocytosis 12/24/2020  . Prediabetes 12/17/2020  . Dysphagia 11/26/2020  . Lumbar spinal stenosis 11/26/2020  . Gait abnormality 11/26/2020  . HTN (hypertension) 11/26/2020  . Adult failure to thrive 11/26/2020  . Osteoarthritis, generalized 11/26/2020  . Generalized weakness   . Blood in urine 11/22/2020  . Acute lower UTI 11/22/2020  . Chronic indwelling Foley catheter 11/22/2020  . AMS (altered mental status) 11/21/2020  . Confusion 11/20/2020  . Late onset Alzheimer's disease with behavioral disturbance ([place]) 11/19/2019  . 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 Fall 03/07/2019  . Noise effect on both inner ears 10/06/2018  . 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 Presence of permanent cardiac pacemaker   . Persistent atrial fibrillation ([place])   . S/P TAVR (transcatheter aortic valve replacement) 05/30/2018  . Severe aortic stenosis   . Tendinopathy of left rotator cuff 12/19/2017  . Bilateral impacted cerumen 11/25/2017  . Presbycusis of both ears 11/25/2017  . Vasomotor rhinitis 11/25/2017  . Dizziness 05/15/2017  . Chronic right maxillary sinusitis 03/30/2017  . Rhinitis 03/30/2017  . Elevated CPK 02/18/2017  . Sinusitis 02/18/2017  . Peripheral neuropathy 02/18/2017  . Chronic maxillary sinusitis 02/18/2017  .  Essential tremor 01/12/2017   . Orthostatic dizziness 01/12/2017  . Diplopia 01/12/2017  . First degree atrioventricular block 03/29/2013  . Hypercholesteremia   . Goiter     Palliative Care Assessment & Plan   Patient Profile: 87 y.o.malewith past medical history of severe aortic stenosis s/p TAVR, complete heart block s/p Chemical engineer pacemaker placement, atrial fibrillation, advanced Alzheimer's and vascular dementia presented to the [facility] ED on 01/06/21 from [facility] with staff complaints of patient having decreased urinary output, foley catheter problems, and AMS. Patient was recently started on antibiotics for UTI prior to ED arrival as well as pulled out his foley which caused urethral bleeding. Urology was consulted to replace foley due to difficult placement issues. Patient wasadmitted on4/5/2022with sepsis secondary to UTI, hematuria secondary to traumatic foley removal, urinary retention due to traumatic foley removal, acute metabolic encephalopathy.  Of note, patient has had one recent hospitalization and ED visit: 2/18-2/23/22 hospitalization for AMS and 2/10-2/11/22 ED visit for acute right-sided thoracic back pain.  Assessment: Sepsis secondary to UTI MRSA bacteremia Persistent atrial fibrillation, s/p pacemaker severe aortic stenosis s/p TAVR Mixed Alzheimer's and vascular dementia Agitation  Recommendations/Plan:  Continue current medical care - treat the treatable  Continue DNR/DNI as previously documented  Wife requests PMT call back tomorrow 4/10  Increased xanax from 0.25 to 0.5 TID PRN anxiety  PMT will continue to follow and support holistically  Goals of Care and Additional Recommendations:  Limitations on Scope of Treatment: Full Scope Treatment and No Tracheostomy  Code Status:    Code Status Orders  (From admission, onward)         Start     Ordered   01/07/21 0023  Do not attempt resuscitation (DNR)  Continuous       Question Answer Comment  In the event  of cardiac or respiratory ARREST Do not call a "code blue"   In the event of cardiac or respiratory ARREST Do not perform Intubation, CPR, defibrillation or ACLS   In the event of cardiac or respiratory ARREST Use medication by any route, position, wound care, and other measures to relive pain and suffering. May use oxygen, suction and manual treatment of airway obstruction as needed for comfort.      01/07/21 0022        Code Status History    Date Active Date Inactive Code Status Order ID Comments User Context   01/06/2021 1727 01/07/2021 0022 DNR [id number]  Valarie Merino, MD ED   11/25/2020 1351 11/26/2020 0742 DNR [id number]  Deatra James, MD Inpatient   11/21/2020 2058 11/22/2020 2142 DNR [id number]  Jonnie Finner, DO Inpatient   04/14/2019 1713 04/18/2019 1458 Full Code [id number]  Lacretia Leigh, MD ED   05/30/2018 1859 06/01/2018 1920 Full Code [id number]  Eileen Stanford, PA-C Inpatient   05/03/2018 0947 05/03/2018 1613 Full Code [id number]  Burnell Blanks, MD Inpatient   02/10/2016 1425 02/12/2016 1546 Full Code [id number]  Baldwin [place], PA-C Inpatient   Advance Care Planning Activity    Advance Directive Documentation   Flowsheet Row Most Recent Value  Type of Advance Directive Out of facility DNR (pink MOST or yellow form)  Pre-existing out of facility DNR order (yellow form or pink MOST form) Pink MOST/Yellow Form most recent copy in chart - Physician notified to receive inpatient order  "MOST" Form in Place? --       Prognosis:   Unable to determine  Discharge Planning:  To Be Determined  Care plan was discussed with primary RN, patient's private caregiver, patient's wife  Thank you for allowing the Palliative Medicine Team to assist in the care of this patient.   Total Time 15 minutes Prolonged Time Billed  no       Greater than 50%  of this time was spent counseling and coordinating care related to the above assessment and plan.  Lin Landsman,  NP  Please contact Palliative Medicine Team phone at [phone] for questions and concerns.

## 2021-01-10 NOTE — Progress Notes (Addendum)
PROGRESS NOTE    Jeffery Rogers  DJM:[id number] DOB: 1932/07/10 DOA: 01/06/2021 PCP: [name], MD   Chief Complaint  Patient presents with  . Altered Mental Status  . Foley Catheter Problem  Brief Narrative: 87 year old male with multiple comorbidities with advanced dementia, complete heart block/severe aortic stenosis with TAVR and PPM in place, persistent A. Fib, chronic Foley who pulled out his Foley catheter with some bleeding and found to have decreased urine output, outpatient replacement of Foley was attempted but could not be completed, he continued to have bloody drainage and was seen in the ED.  He recently completed nitrofurantoin for UTI.  Patient was admitted 01/06/21 found to have MRSA bacteremia. ID, cardiology was consulted-felt patient is not a candidate for TEE or redo aortic valve surgery or lead extraction advised palliative approach IV antibiotics.  Palliative care was also consulted patient is DNR.  Subjective: Seen this morning.  Patient appears much alert awake, able to tell me his name that he is in [place]. He is being fed by his private one-to-one sitter. Afebrile overnight, WBC count stable 9.7K   Assessment & Plan:  UTI  due to urinary indwelling Foley catheter MRSA bacteremia with severe sepsis  POA 2/2 #1: Difficult situation,2/2 TAVR and pacemaker, not a candidate for TEE and not a candidate for redo aortic valve surgery or lead extraction even if if he has infection in the TAVR or pacemaker as per cardiology.  Cardiology has advised palliative approach IV antibiotics, not pursuing TTE.Appreciate cardiology and ID input.  Plan is likely for 6 weeks of IV antibiotics from date of negative blood culture then indefinite/lifelong oral suppressive therapy.  Currently on daptomycin.  CK in 600.  Repeat blood culture sent 4/7 - so far.  Await for ID final recommendation likely PICC line with IV antibiotics early next week.  Severe aortic stenosis S/P TAVR   Complete heart block status post pacemaker Persistent atrial fibrillation: Not on anticoagulation.  Has pacemaker in place.  On telemetry.   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  He has 24-hour private care giver normally.Continue his home Seroquel Remeron and Klonopin.  Continue supportive measures, delirium precaution fall precaution.  Urinary retention status post replacement of Foley in the ED.  Discussed with Dr. Gloriann Loan advised to keep the Foley in place and outpatient follow-up he may need transition to suprapubic catheter on outpatient basis-does not recommend changing for now given his traumatic Foley.  HTN: Well-controlled.  Not on meds.  Anemia of chronic disease hemoglobin overall stable.  Diet Order            DIET DYS 2 Room service appropriate? No; Fluid consistency: Nectar Thick  Diet effective now                 Nutrition Problem: Inadequate oral intake Etiology: chronic illness (Alzheimer's/dementia) Signs/Symptoms: per patient/family report Interventions: MVI,Magic cup,Other (Comment),Refer to RD note for recommendations Patient's Body mass index is 21.09 kg/m.  DVT prophylaxis: SCDs Start: 01/07/21 0023 Code Status:   Code Status: DNR Family Communication: plan of care discussed with caregiver.   Discussed w/ nursing staff Palliative following closely.    I called and updated wife- she tole me they are talking whether to cont with antibiotics or go to hospice. they would like to talk to palliative care too.  Status is: Inpatient Remains inpatient appropriate because:IV treatments appropriate due to intensity of illness or inability  to take PO and Inpatient level of care appropriate due to severity of illness  Dispo: The patient is from: [facility]              Anticipated d/c is to: SNF on IV Abx likley early  coming week              Patient currently is  not medically stable to d/c.   Difficult to place patient No  Unresulted Labs (From admission, onward)          Start     Ordered   01/11/21 0500  CK  Every 72 hours,   R     Question:  Specimen collection method  Answer:  Lab=Lab collect   01/09/21 1508         Medications reviewed: Scheduled Meds: . acetaminophen  1,000 mg Oral TID  . Chlorhexidine Gluconate Cloth  6 each Topical Daily  . clonazePAM  0.5 mg Oral BID  . docusate sodium  100 mg Oral BID  . ipratropium  2 spray Each Nare QID  . mirtazapine  15 mg Oral QHS  . QUEtiapine  100 mg Oral QHS  . senna  1 tablet Oral BID  . tamsulosin  0.4 mg Oral Daily   Continuous Infusions: . DAPTOmycin (CUBICIN)  IV Stopped (01/09/21 2133)    Consultants:see note  Procedures:see note  Antimicrobials: Anti-infectives (From admission, onward)   Start     Dose/Rate Route Frequency Ordered Stop   01/09/21 2000  DAPTOmycin (CUBICIN) 600 mg in sodium chloride 0.9 % IVPB        8 mg/kg  72.6 kg 124 mL/hr over 30 Minutes Intravenous Daily 01/09/21 0854 02/19/21 2359   01/07/21 2015  vancomycin (VANCOREADY) IVPB 1500 mg/300 mL  Status:  Discontinued        1,500 mg 150 mL/hr over 120 Minutes Intravenous Every 24 hours 01/07/21 1923 01/09/21 0854   01/07/21 1600  cefTRIAXone (ROCEPHIN) 1 g in sodium chloride 0.9 % 100 mL IVPB  Status:  Discontinued        1 g 200 mL/hr over 30 Minutes Intravenous Every 24 hours 01/07/21 0022 01/07/21 1932   01/06/21 2030  cefTRIAXone (ROCEPHIN) 1 g in sodium chloride 0.9 % 100 mL IVPB        1 g 200 mL/hr over 30 Minutes Intravenous  Once 01/06/21 2019 01/06/21 2102     Culture/Microbiology    Component Value Date/Time   SDES BLOOD RIGHT FOREARM 01/08/2021 1023   SPECREQUEST  01/08/2021 1023    BOTTLES DRAWN AEROBIC ONLY Blood Culture adequate volume   CULT  01/08/2021 1023    NO GROWTH < 24 HOURS Performed at [facility], [place] [address] [place], [place] [address]    REPTSTATUS  PENDING 01/08/2021 1023    Other culture-see note  Objective: Vitals: Today's Vitals   01/09/21 1103 01/09/21 1641 01/09/21 2141 01/10/21 0454  BP: 100/78 (!) 126/54 114/73 (!) 145/59  Pulse: 61 60 60 60  Resp: 16 14 15 16   Temp: 99 F (37.2 C) 99 F (37.2 C) 98.4 F (36.9 C) 98.5 F (36.9 C)  TempSrc: Axillary Axillary    SpO2: 91% 100%    Weight:   72.5 kg   Height:      PainSc:        Intake/Output Summary (Last 24 hours) at 01/10/2021 0941 Last data filed at 01/10/2021 0518 Gross per 24 hour  Intake 302 ml  Output 825 ml  Net -523 ml   Filed Weights   01/06/21 1558 01/09/21 2141  Weight: 72.6 kg 72.5 kg   Weight change:   Intake/Output from previous day: 04/08 0701 - 04/09 0700 In: 302 [P.O.:240; IV Piggyback:62] Out: 825 [Urine:825] Intake/Output this shift: No intake/output data recorded. Filed Weights   01/06/21 1558 01/09/21 2141  Weight: 72.6 kg 72.5 kg    Examination: General exam:AAOx1, elderly demented frail HEENT:Oral mucosa dry, Ear/Nose WNL grossly,dentition normal. Respiratory system:Bilaterally clear,no wheezing or crackles,no use of accessory muscle Cardiovascular system:S1 & S2 +,No JVD. Gastrointestinal system:Abdomen soft, NT,ND, BS+.  Foley catheter in place Nervous System:Alert,awake, demented oriented times x1, in fetal position Extremities: No edema, distal peripheral pulses palpable.  Skin: No rashes,no icterus. MSK: Normal muscle bulk,tone, power  Data Reviewed: I have personally reviewed following labs and imaging studies CBC: Recent Labs  Lab 01/06/21 1624 01/07/21 0700 01/08/21 0414 01/10/21 0355  WBC 13.5* 10.2 10.9* 9.7  NEUTROABS 11.7* 8.4*  --   --   HGB 10.7* 11.2* 11.2* 12.8*  HCT 33.5* 35.2* 35.1* 39.9  MCV 96.0 96.4 94.6 93.4  PLT 271 233 189 503   Basic Metabolic Panel: Recent Labs  Lab 01/06/21 1624 01/07/21 0700 01/08/21 0414 01/10/21 0355  NA 141 141 141 144  K 4.4 4.4 3.9 3.5  CL 108 107 110 112*   CO2 25 25 25 24   GLUCOSE 109* 119* 105* 107*  BUN 25* 24* 31* 30*  CREATININE 1.09 0.95 0.87 0.77  CALCIUM 8.9 8.8* 8.8* 8.9  MG  --  2.1  --   --   PHOS  --  3.5  --   --    GFR: Estimated Creatinine Clearance: 66.7 mL/min (by C-G formula based on SCr of 0.77 mg/dL). Liver Function Tests: Recent Labs  Lab 01/06/21 1624 01/07/21 0700  AST 41 43*  ALT 35 35  ALKPHOS 100 90  BILITOT 0.7 0.7  PROT 6.8 6.6  ALBUMIN 2.9* 2.6*   No results for input(s): LIPASE, AMYLASE in the last 168 hours. No results for input(s): AMMONIA in the last 168 hours. Coagulation Profile: Recent Labs  Lab 01/06/21 1624  INR 1.2   Cardiac Enzymes: Recent Labs  Lab 01/09/21 0904  CKTOTAL 680*   BNP (last 3 results) No results for input(s): PROBNP in the last 8760 hours. HbA1C: No results for input(s): HGBA1C in the last 72 hours. CBG: No results for input(s): GLUCAP in the last 168 hours. Lipid Profile: No results for input(s): CHOL, HDL, LDLCALC, TRIG, CHOLHDL, LDLDIRECT in the last 72 hours. Thyroid Function Tests: Recent Labs    01/07/21 1400  TSH 0.713   Anemia Panel: No results for input(s): VITAMINB12, FOLATE, FERRITIN, TIBC, IRON, RETICCTPCT in the last 72 hours. Sepsis Labs: Recent Labs  Lab 01/06/21 2021 01/07/21 1400  LATICACIDVEN 1.4 1.1    Recent Results (from the past 240 hour(s))  Culture, blood (routine x 2)     Status: Abnormal   Collection Time: 01/06/21  8:35 PM   Specimen: BLOOD  Result Value Ref Range Status   Specimen Description BLOOD RIGHT ANTECUBITAL  Final   Special Requests   Final    BOTTLES DRAWN AEROBIC AND ANAEROBIC Blood Culture adequate volume   Culture  Setup Time   Final    GRAM POSITIVE COCCI IN CLUSTERS IN BOTH AEROBIC AND ANAEROBIC BOTTLES Organism ID to follow CRITICAL RESULT CALLED TO, READ BACK BY AND VERIFIED WITH: Fayne Mediate, AT 1911 01/07/21 Rush [facility]mark Performed  at [facility], [place] [address] [place], [place]  [address]    Culture METHICILLIN RESISTANT STAPHYLOCOCCUS AUREUS (A)  Final   Report Status 01/09/2021 FINAL  Final   Organism ID, Bacteria METHICILLIN RESISTANT STAPHYLOCOCCUS AUREUS  Final      Susceptibility   Methicillin resistant staphylococcus aureus - MIC*    CIPROFLOXACIN >=8 RESISTANT Resistant     ERYTHROMYCIN >=8 RESISTANT Resistant     GENTAMICIN <=0.5 SENSITIVE Sensitive     OXACILLIN >=4 RESISTANT Resistant     TETRACYCLINE <=1 SENSITIVE Sensitive     VANCOMYCIN <=0.5 SENSITIVE Sensitive     TRIMETH/SULFA >=320 RESISTANT Resistant     CLINDAMYCIN <=0.25 SENSITIVE Sensitive     RIFAMPIN <=0.5 SENSITIVE Sensitive     Inducible Clindamycin NEGATIVE Sensitive     * METHICILLIN RESISTANT STAPHYLOCOCCUS AUREUS  Blood Culture ID Panel (Reflexed)     Status: Abnormal   Collection Time: 01/06/21  8:35 PM  Result Value Ref Range Status   Enterococcus faecalis NOT DETECTED NOT DETECTED Final   Enterococcus Faecium NOT DETECTED NOT DETECTED Final   Listeria monocytogenes NOT DETECTED NOT DETECTED Final   Staphylococcus species DETECTED (A) NOT DETECTED Final    Comment: CRITICAL RESULT CALLED TO, READ BACK BY AND VERIFIED WITH: Salome Holmes PHARMD, AT 1911 01/07/21 D. VANHOOK    Staphylococcus aureus (BCID) DETECTED (A) NOT DETECTED Final    Comment: Methicillin (oxacillin)-resistant Staphylococcus aureus (MRSA). MRSA is predictably resistant to beta-lactam antibiotics (except ceftaroline). Preferred therapy is vancomycin unless clinically contraindicated. Patient requires contact precautions if  hospitalized. CRITICAL RESULT CALLED TO, READ BACK BY AND VERIFIED WITH: Salome Holmes PHARMD, AT 1911 01/07/21 D. VANHOOK    Staphylococcus epidermidis NOT DETECTED NOT DETECTED Final   Staphylococcus lugdunensis NOT DETECTED NOT DETECTED Final   Streptococcus species NOT DETECTED NOT DETECTED Final   Streptococcus agalactiae NOT DETECTED NOT DETECTED Final   Streptococcus pneumoniae NOT DETECTED  NOT DETECTED Final   Streptococcus pyogenes NOT DETECTED NOT DETECTED Final   A.calcoaceticus-baumannii NOT DETECTED NOT DETECTED Final   Bacteroides fragilis NOT DETECTED NOT DETECTED Final   Enterobacterales NOT DETECTED NOT DETECTED Final   Enterobacter cloacae complex NOT DETECTED NOT DETECTED Final   Escherichia coli NOT DETECTED NOT DETECTED Final   Klebsiella aerogenes NOT DETECTED NOT DETECTED Final   Klebsiella oxytoca NOT DETECTED NOT DETECTED Final   Klebsiella pneumoniae NOT DETECTED NOT DETECTED Final   Proteus species NOT DETECTED NOT DETECTED Final   Salmonella species NOT DETECTED NOT DETECTED Final   Serratia marcescens NOT DETECTED NOT DETECTED Final   Haemophilus influenzae NOT DETECTED NOT DETECTED Final   Neisseria meningitidis NOT DETECTED NOT DETECTED Final   Pseudomonas aeruginosa NOT DETECTED NOT DETECTED Final   Stenotrophomonas maltophilia NOT DETECTED NOT DETECTED Final   Candida albicans NOT DETECTED NOT DETECTED Final   Candida auris NOT DETECTED NOT DETECTED Final   Candida glabrata NOT DETECTED NOT DETECTED Final   Candida krusei NOT DETECTED NOT DETECTED Final   Candida parapsilosis NOT DETECTED NOT DETECTED Final   Candida tropicalis NOT DETECTED NOT DETECTED Final   Cryptococcus neoformans/gattii NOT DETECTED NOT DETECTED Final   Meth resistant mecA/C and MREJ DETECTED (A) NOT DETECTED Final    Comment: CRITICAL RESULT CALLED TO, READ BACK BY AND VERIFIED WITH: Fayne Mediate, AT 1911 01/07/21 Rush Landmark Performed at [facility], [address] [place], [place] [address]   Culture, blood (routine x 2)  Status: None (Preliminary result)   Collection Time: 01/06/21  8:38 PM   Specimen: BLOOD RIGHT FOREARM  Result Value Ref Range Status   Specimen Description BLOOD RIGHT FOREARM  Final   Special Requests   Final    BOTTLES DRAWN AEROBIC AND ANAEROBIC Blood Culture results may not be optimal due to an inadequate volume of blood received in  culture bottles   Culture   Final    NO GROWTH 3 DAYS Performed at [facility], [place] [address], [place], [place] [address]    Report Status PENDING  Incomplete  Resp Panel by RT-PCR (Flu A&B, Covid) Urine, Catheterized     Status: None   Collection Time: 01/06/21  9:18 PM   Specimen: Urine, Catheterized; Nasopharyngeal(NP) swabs in vial transport medium  Result Value Ref Range Status   SARS Coronavirus 2 by RT PCR NEGATIVE NEGATIVE Final    Comment: (NOTE) SARS-CoV-2 target nucleic acids are NOT DETECTED.  The SARS-CoV-2 RNA is generally detectable in upper respiratory specimens during the acute phase of infection. The lowest concentration of SARS-CoV-2 viral copies this assay can detect is 138 copies/mL. A negative result does not preclude SARS-Cov-2 infection and should not be used as the sole basis for treatment or other patient management decisions. A negative result may occur with  improper specimen collection/handling, submission of specimen other than nasopharyngeal swab, presence of viral mutation(s) within the areas targeted by this assay, and inadequate number of viral copies(<138 copies/mL). A negative result must be combined with clinical observations, patient history, and epidemiological information. The expected result is Negative.  Fact Sheet for Patients:  EntrepreneurPulse.com.au  Fact Sheet for Healthcare Providers:  IncredibleEmployment.be  This test is no t yet approved or cleared by the [place] FDA and  has been authorized for detection and/or diagnosis of SARS-CoV-2 by FDA under an Emergency Use Authorization (EUA). This EUA will remain  in effect (meaning this test can be used) for the duration of the COVID-19 declaration under Section 564(b)(1) of the Act, 21 U.S.C.section 360bbb-3(b)(1), unless the authorization is terminated  or revoked sooner.       Influenza A by PCR NEGATIVE NEGATIVE Final    Influenza B by PCR NEGATIVE NEGATIVE Final    Comment: (NOTE) The Xpert Xpress SARS-CoV-2/FLU/RSV plus assay is intended as an aid in the diagnosis of influenza from Nasopharyngeal swab specimens and should not be used as a sole basis for treatment. Nasal washings and aspirates are unacceptable for Xpert Xpress SARS-CoV-2/FLU/RSV testing.  Fact Sheet for Patients: EntrepreneurPulse.com.au  Fact Sheet for Healthcare Providers: IncredibleEmployment.be  This test is not yet approved or cleared by the [place] FDA and has been authorized for detection and/or diagnosis of SARS-CoV-2 by FDA under an Emergency Use Authorization (EUA). This EUA will remain in effect (meaning this test can be used) for the duration of the COVID-19 declaration under Section 564(b)(1) of the Act, 21 U.S.C. section 360bbb-3(b)(1), unless the authorization is terminated or revoked.  Performed at [facility], [place] [address] [place], [place] [address]   Urine culture     Status: Abnormal   Collection Time: 01/06/21  9:35 PM   Specimen: Urine, Random  Result Value Ref Range Status   Specimen Description URINE, RANDOM  Final   Special Requests   Final    NONE Performed at [facility], [place] [address], [place], [place] [address]    Culture 10,000 COLONIES/mL KLEBSIELLA PNEUMONIAE (A)  Final  Report Status 01/09/2021 FINAL  Final   Organism ID, Bacteria KLEBSIELLA PNEUMONIAE (A)  Final      Susceptibility   Klebsiella pneumoniae - MIC*    AMPICILLIN >=32 RESISTANT Resistant     CEFAZOLIN <=4 SENSITIVE Sensitive     CEFEPIME <=0.12 SENSITIVE Sensitive     CEFTRIAXONE <=0.25 SENSITIVE Sensitive     CIPROFLOXACIN <=0.25 SENSITIVE Sensitive     GENTAMICIN <=1 SENSITIVE Sensitive     IMIPENEM <=0.25 SENSITIVE Sensitive     NITROFURANTOIN 64 INTERMEDIATE Intermediate     TRIMETH/SULFA <=20 SENSITIVE Sensitive     AMPICILLIN/SULBACTAM 4 SENSITIVE Sensitive      PIP/TAZO <=4 SENSITIVE Sensitive     * 10,000 COLONIES/mL KLEBSIELLA PNEUMONIAE  Culture, blood (routine x 2)     Status: None (Preliminary result)   Collection Time: 01/08/21 10:22 AM   Specimen: BLOOD RIGHT FOREARM  Result Value Ref Range Status   Specimen Description BLOOD RIGHT FOREARM  Final   Special Requests   Final    BOTTLES DRAWN AEROBIC ONLY Blood Culture adequate volume   Culture   Final    NO GROWTH < 24 HOURS Performed at [facility], [address] [place], [place] [address]    Report Status PENDING  Incomplete  Culture, blood (routine x 2)     Status: None (Preliminary result)   Collection Time: 01/08/21 10:23 AM   Specimen: BLOOD RIGHT FOREARM  Result Value Ref Range Status   Specimen Description BLOOD RIGHT FOREARM  Final   Special Requests   Final    BOTTLES DRAWN AEROBIC ONLY Blood Culture adequate volume   Culture   Final    NO GROWTH < 24 HOURS Performed at [facility], [place] [address], [place], [place] [address]    Report Status PENDING  Incomplete     Radiology Studies: ECHOCARDIOGRAM LIMITED  Result Date: 01/09/2021    ECHOCARDIOGRAM LIMITED REPORT   Patient Name:   LINWOOD GULLIKSON Date of Exam: 01/09/2021 Medical Rec #:  [id number]       Height:       73.0 in Accession #:    [id number]      Weight:       160.0 lb Date of Birth:  04/06/1932        BSA:          1.957 m Patient Age:    81 years        BP:           100/78 mmHg Patient Gender: M               HR:           61 bpm. Exam Location:  Inpatient Procedure: Limited Echo Indications:    Bacteremia R78.81  History:        Patient has prior history of Echocardiogram examinations, most                 recent 11/07/2020. Pacemaker, Arrythmias:Atrial Fibrillation; Risk                 Factors:Hypertension, Former Smoker and Dyslipidemia.                 Mixed Alzheimer's and vascular dementia with behavior                 disturbances , S/P TAVR (transcatheter aortic valve replacement)  05/30/2018 23 mm Edwards Sapien 3 , CH[facility].  Sonographer:    Leavy Cella Referring Phys: [place]  1. Left ventricular ejection fraction, by estimation, is 60 to 65%. The left ventricle has normal function. The left ventricle has no regional wall motion abnormalities. Left ventricular diastolic parameters are indeterminate.  2. The mitral valve is degenerative. Mild to moderate mitral valve regurgitation. Severe mitral annular calcification.  3. The tricuspid valve is abnormal. Tricuspid valve regurgitation is moderate to severe.  4. There is a 23 mm Edwards Sapien prosthetic, stented (TAVR) valve present in the aortic position. Aortic valve regurgitation is mild to moderate. Aortic valve mean gradient measures 18.0 mmHg. FINDINGS  Left Ventricle: Left ventricular ejection fraction, by estimation, is 60 to 65%. The left ventricle has normal function. The left ventricle has no regional wall motion abnormalities. Left ventricular diastolic parameters are indeterminate. Pericardium: There is no evidence of pericardial effusion. Mitral Valve: The mitral valve is degenerative in appearance. Severe mitral annular calcification. Mild to moderate mitral valve regurgitation. Tricuspid Valve: The tricuspid valve is abnormal. Tricuspid valve regurgitation is moderate to severe. Aortic Valve: The aortic valve has been repaired/replaced. Aortic valve regurgitation is mild to moderate. Aortic valve mean gradient measures 18.0 mmHg. There is a 23 mm Edwards Sapien prosthetic, stented (TAVR) valve present in the aortic position. AORTIC VALVE AV Mean Grad: 18.0 mmHg MITRAL VALVE                TRICUSPID VALVE MV Area (PHT): 1.87 cm     TR Peak grad:   42.8 mmHg MV Decel Time: 405 msec     TR Vmax:        327.00 cm/s MV E velocity: 172.00 cm/s MV A velocity: 63.50 cm/s MV E/A ratio:  2.71 Oswaldo Milian MD Electronically signed by Oswaldo Milian MD Signature Date/Time: 01/09/2021/3:18:27 PM     Final      LOS: 3 days   Antonieta Pert, MD [facility]  01/10/2021, 9:41 AM

## 2021-01-10 NOTE — Social Work (Signed)
CSW did not complete FL2 at this time due to pt being in mitts.   Jeffery Rogers, Latanya Presser, [place] Social Worker [phone]

## 2021-01-11 DIAGNOSIS — F909 Attention-deficit hyperactivity disorder, unspecified type: Secondary | ICD-10-CM

## 2021-01-11 LAB — CULTURE, BLOOD (ROUTINE X 2): Culture: NO GROWTH

## 2021-01-11 LAB — CK: Total CK: 366 U/L (ref 49–397)

## 2021-01-11 MED ORDER — QUETIAPINE FUMARATE 100 MG PO TABS
100.0000 mg | ORAL_TABLET | Freq: Two times a day (BID) | ORAL | Status: DC
  Administered 2021-01-11 – 2021-01-13 (×4): 100 mg via ORAL
  Filled 2021-01-11 (×4): qty 1

## 2021-01-11 NOTE — Progress Notes (Signed)
Daily Progress Note   Patient Name: Jeffery Rogers       Date: 01/11/2021 DOB: 1932-05-02  Age: 87 y.o. MRN#: [id number] Attending Physician: Antonieta Pert, MD Primary Care Physician: [name], MD Admit Date: 01/06/2021  Reason for Consultation/Follow-up: Establishing goals of care  Subjective: Chart review performed. Received report from primary RN - acute concern of ongoing agitation/restlessness despite use of current pain and anxiety medications. Patient is still requiring mitts.   Went to visit patient at bedside - no family/visitors present. Personal caregiver is at bedside feeding patient dinner - he has eaten 90% so far. Received updates from caregiver - patient remains agitated. Patient is awake and alert, but remains disoriented. He does not respond or acknowledge my presence. He still appears restless/hyperactive. No signs or non-verbal gestures of pain or discomfort noted other than hyperactivity and anxious state. No respiratory distress, increased work of breathing, or secretions noted.   6:24 PM  I called patient's wife/[name] to provide updates, continue [place], and offer support. When I introduced myself, [name] stated she and her family "had things to figure out but do not want Palliative involved anymore" and she "had nothing to discuss today." I offered to answer questions, provide updates, or call back tomorrow and she said "if we need you we will call you." I verified [name] had the PMT number to call if needed. Before ending the call I discussed increasing the patient's Seroquel due to his ongoing restlessness and she was agreeable. Encouraged to [facility] to call with any future questions or concerns.   Notified Dr. Lupita Leash that family request PMT no longer be involved in patient's  care.    Length of Stay: 4  Current Medications: Scheduled Meds:  . acetaminophen  1,000 mg Oral TID  . Chlorhexidine Gluconate Cloth  6 each Topical Daily  . clonazePAM  0.5 mg Oral BID  . docusate sodium  100 mg Oral BID  . ipratropium  2 spray Each Nare QID  . mirtazapine  15 mg Oral QHS  . QUEtiapine  100 mg Oral QHS  . senna  1 tablet Oral BID  . tamsulosin  0.4 mg Oral Daily    Continuous Infusions: . DAPTOmycin (CUBICIN)  IV 600 mg (01/10/21 2017)    PRN Meds: acetaminophen **OR** acetaminophen, ALPRAZolam, morphine injection, oxyCODONE-acetaminophen, polyvinyl  alcohol, Resource ThickenUp Clear  Physical Exam Vitals and nursing note reviewed.  Constitutional:      General: He is not in acute distress.    Appearance: He is ill-appearing.  Pulmonary:     Effort: No respiratory distress.  Skin:    General: Skin is warm and dry.  Neurological:     Mental Status: He is alert. He is disoriented and confused.     Motor: Weakness present.  Psychiatric:        Mood and Affect: Mood is anxious.        Behavior: Behavior is hyperactive.        Cognition and Memory: Cognition is impaired. Memory is impaired.             Vital Signs: BP 134/79 (BP Location: Right Arm)   Pulse 61   Temp 97.8 F (36.6 C)   Resp 17   Ht 6\' 1"  (1.854 m)   Wt 72.5 kg   SpO2 100%   BMI 21.09 kg/m  SpO2: SpO2: 100 % O2 Device: O2 Device: Room Air O2 Flow Rate:    Intake/output summary:   Intake/Output Summary (Last 24 hours) at 01/11/2021 1726 Last data filed at 01/11/2021 0600 Gross per 24 hour  Intake 382 ml  Output 375 ml  Net 7 ml   LBM: Last BM Date: 01/11/21 Baseline Weight: Weight: 72.6 kg Most recent weight: Weight: 72.5 kg       Palliative Assessment/Data: PPS 30%    Flowsheet Rows   Flowsheet Row Most Recent Value  Intake Tab   Referral Department Hospitalist  Unit at Time of Referral ER  Palliative Care Primary Diagnosis Neurology  Date Notified 01/06/21   Palliative Care Type New Palliative care  Reason for referral Clarify Goals of Care  Date of Admission 01/06/21  Date first seen by Palliative Care 01/07/21  # of days Palliative referral response time 1 Day(s)  # of days IP prior to Palliative referral 0  Clinical Assessment   Psychosocial & Spiritual Assessment   Palliative Care Outcomes   Patient/Family meeting held? Yes  Who was at the meeting? wife  [scheduled additional meeting with wife and daughter]  Palliative Care Outcomes Improved non-pain symptom therapy, Clarified goals of care, Counseled regarding hospice, Provided psychosocial or spiritual support  Patient/Family wishes: Interventions discontinued/not started  Transfer out of ICU, PEG, Vasopressors, Tube feedings/TPN, BiPAP      Patient Active Problem List   Diagnosis Date Noted  . Sepsis due to methicillin resistant Staphylococcus aureus (MRSA) ([place]) 01/10/2021  . UTI (urinary tract infection) 01/07/2021  . Sepsis ([place]) 01/06/2021  . UTI (urinary tract infection) due to urinary indwelling Foley catheter ([place]) 01/05/2021  . Leukocytosis 12/24/2020  . Prediabetes 12/17/2020  . Dysphagia 11/26/2020  . Lumbar spinal stenosis 11/26/2020  . Gait abnormality 11/26/2020  . HTN (hypertension) 11/26/2020  . Adult failure to thrive 11/26/2020  . Osteoarthritis, generalized 11/26/2020  . Generalized weakness   . Blood in urine 11/22/2020  . Acute lower UTI 11/22/2020  . Chronic indwelling Foley catheter 11/22/2020  . AMS (altered mental status) 11/21/2020  . Confusion 11/20/2020  . Late onset Alzheimer's disease with behavioral disturbance ([place]) 11/19/2019  . 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 Fall 03/07/2019  . Noise effect on both inner ears 10/06/2018  . 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 Presence of permanent cardiac pacemaker   . Persistent atrial fibrillation ([place])   . S/P  TAVR (transcatheter aortic valve replacement)  05/30/2018  . Severe aortic stenosis   . Tendinopathy of left rotator cuff 12/19/2017  . Bilateral impacted cerumen 11/25/2017  . Presbycusis of both ears 11/25/2017  . Vasomotor rhinitis 11/25/2017  . Dizziness 05/15/2017  . Chronic right maxillary sinusitis 03/30/2017  . Rhinitis 03/30/2017  . Elevated CPK 02/18/2017  . Sinusitis 02/18/2017  . Peripheral neuropathy 02/18/2017  . Chronic maxillary sinusitis 02/18/2017  . Essential tremor 01/12/2017  . Orthostatic dizziness 01/12/2017  . Diplopia 01/12/2017  . First degree atrioventricular block 03/29/2013  . Hypercholesteremia   . Goiter     Palliative Care Assessment & Plan   Patient Profile: 87 y.o.malewith past medical history of severe aortic stenosis s/p TAVR, complete heart block s/p Chemical engineer pacemaker placement, atrial fibrillation, advanced Alzheimer's and vascular dementia presented [facility] the [facility] ED on [facility]06/21 from [facility] with staff complaints of patient having decreased urinary output, foley catheter problems, and AMS. Patient was recently started on antibiotics for UTI prior to ED arrival as well as pulled out his foley which caused urethral bleeding. Urology was consulted to replace foley due to difficult placement issues. Patient wasadmitted on4/5/2022with sepsis secondary to UTI, hematuria secondary to traumatic foley removal, urinary retention due to traumatic foley removal, acute metabolic encephalopathy.  Of note, patient has had one recent hospitalization and ED visit: 2/18-2/23/22 hospitalization for AMS and 2/10-2/11/22 ED visit for acute right-sided thoracic back pain.  Assessment: Sepsis secondary to UTI MRSA bacteremia Persistent atrial fibrillation, s/p pacemaker severe aortic stenosis s/p TAVR Mixed Alzheimer's and vascular dementia Agitation  Recommendations/Plan:  Wife has asked PMT to no longer be involved in patient's care. She states she will reach out to PMT if any needs arise.  I was not able to continue [place] discussions  Increased Seroquel to 100mg  BID due to ongoing agitation/restlessness/hyperactivity   Noted last EKG was completed 4/6 and QTc was 458. If increasing Seroquel does not improve agitation/restlessness/hyperactivity, recommend starting low dose Zyprexa 2.5 mg daily at bedtime - of all the antipsychotics this has one of the least effects on QTc and can provide benefit of decreased agitation/hyperactivity. Since patient is not full comfort, would have risk/benefit conversation with wife before initiating  If wife decides to continue IV antibiotics long term, would recommend outpatient Palliative Care to follow on discharge  If wife decides they do not want to continue IV antibiotics, patient would be hospice appropriate  PMT will continue to follow peripherally. If family would like to continue PMT service, we would be happy to re-engage    Goals of Care and Additional Recommendations:  Limitations on Scope of Treatment: Full Scope Treatment  Code Status:    Code Status Orders  (From admission, onward)         Start     Ordered   01/07/21 0023  Do not attempt resuscitation (DNR)  Continuous       Question Answer Comment  In the event of cardiac or respiratory ARREST Do not call a "code blue"   In the event of cardiac or respiratory ARREST Do not perform Intubation, CPR, defibrillation or ACLS   In the event of cardiac or respiratory ARREST Use medication by any route, position, wound care, and other measures to relive pain and suffering. May use oxygen, suction and manual treatment of airway obstruction as needed for comfort.      01/07/21 0022        Code Status History    Date Active  Date Inactive Code Status Order ID Comments User Context   01/06/2021 1727 01/07/2021 0022 DNR [id number]  Valarie Merino, MD ED   11/25/2020 1351 11/26/2020 0742 DNR [id number]  Deatra James, MD Inpatient   11/21/2020 2058 11/22/2020 2142 DNR [id number]   Jonnie Finner, DO Inpatient   04/14/2019 1713 04/18/2019 1458 Full Code [id number]  Lacretia Leigh, MD ED   05/30/2018 1859 06/01/2018 1920 Full Code [id number]  Eileen Stanford, PA-C Inpatient   05/03/2018 0947 05/03/2018 1613 Full Code [id number]  Burnell Blanks, MD Inpatient   02/10/2016 1425 02/12/2016 1546 Full Code [id number]  Baldwin Jamaica, PA-C Inpatient   Advance Care Planning Activity    Advance Directive Documentation   Flowsheet Row Most Recent Value  Type of Advance Directive Out of facility DNR (pink MOST or yellow form)  Pre-existing out of facility DNR order (yellow form or pink MOST form) Pink MOST/Yellow Form most recent copy in chart - Physician notified to receive inpatient order  "MOST" Form in Place? --       Prognosis:  Overall poor in the setting of advanced age, dementia, and multiple comorbidities   Discharge Planning:  To Be Determined  Care plan was discussed with primary RN, patient's personal caregiver, patient's wife, Dr. Lupita Leash, Dr. Domingo Cocking  Thank you for allowing the Palliative Medicine Team to assist in the care of this patient.   Time In: 1800 Time Out: 1845 Total Time 45 minutes Prolonged Time Billed  no       Greater than 50%  of this time was spent counseling and coordinating care related to the above assessment and plan.  Lin Landsman, NP  Please contact Palliative Medicine 336- Team phone at [phone] for questions and concerns.

## 2021-01-11 NOTE — Progress Notes (Signed)
PROGRESS NOTE    Jeffery Rogers  BLT:[id number] DOB: March 08, 1932 DOA: 01/06/2021 PCP: [name], MD   Chief Complaint  Patient presents with  . Altered Mental Status  . Foley Catheter Problem  Brief Narrative: 87 year old male with multiple comorbidities with advanced dementia, complete heart block/severe aortic stenosis with TAVR and PPM in place, persistent A. Fib, chronic Foley who pulled out his Foley catheter with some bleeding and found to have decreased urine output, outpatient replacement of Foley was attempted but could not be completed, he continued to have bloody drainage and was seen in the ED.  He recently completed nitrofurantoin for UTI.  Patient was admitted 01/06/21 found to have MRSA bacteremia. ID, cardiology was consulted-felt patient is not a candidate for TEE or redo aortic valve surgery or lead extraction advised palliative approach IV antibiotics.  Palliative care was also consulted patient is DNR/DNI. patient remains on IV antibiotic being followed by palliative care until further decision by family re further plan of care  Subjective: Afebrile CK improved to 366 One-to-one at bedside,patient is talking to himself,not following commands. He is in fetal position.  Assessment & Plan:  UTI  due to urinary indwelling Foley catheter MRSA bacteremia with severe sepsis  POA 2/2 #1: Difficult situation,2/2 TAVR and pacemaker, not a candidate for TEE and not a candidate for redo aortic valve surgery or lead extraction even if if he has infection in the TAVR or pacemaker as per cardiology, not personally TTE. Cardiology has advised palliative approach IV antibiotics.  Palliative care following for ongoing discussion for further plan of care.  If family decides to pursue further plan will be:6 weeks of IV antibiotics followed by indefinite oral suppressive therapy. CK normal 366. Cont daptomycin, pharmacy/ID managing. Repeat blood culture sent 4/7 is negative so far.  Severe  aortic stenosis S/P TAVR  Complete heart block status post pacemaker Persistent atrial fibrillation: Not on anticoagulation.  Has pacemaker in place.  On telemetry.   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  He has 24-hour private care giver normally.Continue his home Seroquel Remeron and Klonopin.  Palliative care adjusted medication with Xanax.  Continue supportive measures, delirium precaution fall precaution.  Urinary retention status post replacement of Foley in the ED.  Discussed with Dr. Gloriann Loan advised to keep the Foley in place and outpatient follow-up he may need transition to suprapubic catheter on outpatient basis-does not recommend changing for now given his traumatic Foley.  HTN: Blood pressure is stable.not on meds.  Anemia of chronic disease hemoglobin stable.  Diet Order            DIET DYS 2 Room service appropriate? No; Fluid consistency: Nectar Thick  Diet effective now                 Nutrition Problem: Inadequate oral intake Etiology: chronic illness (Alzheimer's/dementia) Signs/Symptoms: per patient/family report Interventions: MVI,Magic cup,Other (Comment),Refer to RD note for recommendations Patient's Body mass index is 21.09 kg/m.  DVT prophylaxis: SCDs Start: 01/07/21 0023 Code Status:   Code Status: DNR Family Communication: plan of care discussed with caregiver.   Discussed w/ nursing staff Palliative following closely.    I had called and updated wife 4.9- they are actively discussing about pursuing hospice versus further IV antibiotics, palliative care following for further discussion  Status is: Inpatient Remains inpatient appropriate because:IV treatments appropriate due to intensity of illness or inability to take PO and Inpatient  level of care appropriate due to severity of illness  Dispo: The patient is from: [facility]              Anticipated d/c  is to:TBD              Patient currently is not medically stable to d/c.   Difficult to place patient No  Unresulted Labs (From admission, onward)          Start     Ordered   01/11/21 0500  CK  Every 72 hours,   R     Question:  Specimen collection method  Answer:  Lab=Lab collect   01/09/21 1508         Medications reviewed: Scheduled Meds: . acetaminophen  1,000 mg Oral TID  . Chlorhexidine Gluconate Cloth  6 each Topical Daily  . clonazePAM  0.5 mg Oral BID  . docusate sodium  100 mg Oral BID  . ipratropium  2 spray Each Nare QID  . mirtazapine  15 mg Oral QHS  . QUEtiapine  100 mg Oral QHS  . senna  1 tablet Oral BID  . tamsulosin  0.4 mg Oral Daily   Continuous Infusions: . DAPTOmycin (CUBICIN)  IV 600 mg (01/10/21 2017)    Consultants:see note  Procedures:see note  Antimicrobials: Anti-infectives (From admission, onward)   Start     Dose/Rate Route Frequency Ordered Stop   01/09/21 2000  DAPTOmycin (CUBICIN) 600 mg in sodium chloride 0.9 % IVPB        8 mg/kg  72.6 kg 124 mL/hr over 30 Minutes Intravenous Daily 01/09/21 0854 02/19/21 2359   01/07/21 2015  vancomycin (VANCOREADY) IVPB 1500 mg/300 mL  Status:  Discontinued        1,500 mg 150 mL/hr over 120 Minutes Intravenous Every 24 hours 01/07/21 1923 01/09/21 0854   01/07/21 1600  cefTRIAXone (ROCEPHIN) 1 g in sodium chloride 0.9 % 100 mL IVPB  Status:  Discontinued        1 g 200 mL/hr over 30 Minutes Intravenous Every 24 hours 01/07/21 0022 01/07/21 1932   01/06/21 2030  cefTRIAXone (ROCEPHIN) 1 g in sodium chloride 0.9 % 100 mL IVPB        1 g 200 mL/hr over 30 Minutes Intravenous  Once 01/06/21 2019 01/06/21 2102     Culture/Microbiology    Component Value Date/Time   SDES BLOOD RIGHT FOREARM 01/08/2021 1023   SPECREQUEST  01/08/2021 1023    BOTTLES DRAWN AEROBIC ONLY Blood Culture adequate volume   CULT  01/08/2021 1023    NO GROWTH 2 DAYS Performed at [facility], [place] [address],  [place], [place] [address]    REPTSTATUS PENDING 01/08/2021 1023    Other culture-see note  Objective: Vitals: Today's Vitals   01/10/21 1756 01/10/21 1929 01/10/21 2120 01/11/21 0632  BP: 129/71  (!) 138/98 (!) 145/74  Pulse: 64  (!) 59 (!) 54  Resp: 18  16 16   Temp: 98.7 F (37.1 C)  99.4 F (37.4 C) 97.9 F (36.6 C)  TempSrc:   Oral Oral  SpO2:   100% 100%  Weight:      Height:      PainSc:  0-No pain      Intake/Output Summary (Last 24 hours) at 01/11/2021 0751 Last data filed at 01/11/2021 0600 Gross per 24 hour  Intake 502 ml  Output 575 ml  Net -73 ml   Filed Weights   01/06/21 1558 01/09/21 2141  Weight: 72.6 kg 72.5 kg   Weight change:   Intake/Output from previous day: 04/09 0701 - 04/10 0700 In: 502 [P.O.:440; IV Piggyback:62] Out: 575 [Urine:575] Intake/Output this shift: No intake/output data recorded. Filed Weights   01/06/21 1558 01/09/21 2141  Weight: 72.6 kg 72.5 kg    Examination: General exam: Alert awake oriented x0, demented in fetal position, thin frail  HEENT:Oral mucosa moist, Ear/Nose WNL grossly, dentition normal. Respiratory system: bilaterally clear,no wheezing or crackles,no use of accessory muscle Cardiovascular system: S1 & S2 +, No JVD,. Gastrointestinal system: Abdomen soft, NT,ND, BS+ Nervous System:Alert, awake, in fetal position, moving extremities . Extremities: No edema, distal peripheral pulses palpable.  Skin: No rashes,no icterus. MSK: Normal muscle bulk,tone, power Foley catheter in place Data Reviewed: I have personally reviewed following labs and imaging studies CBC: Recent Labs  Lab 01/06/21 1624 01/07/21 0700 01/08/21 0414 01/10/21 0355  WBC 13.5* 10.2 10.9* 9.7  NEUTROABS 11.7* 8.4*  --   --   HGB 10.7* 11.2* 11.2* 12.8*  HCT 33.5* 35.2* 35.1* 39.9  MCV 96.0 96.4 94.6 93.4  PLT 271 233 189 124   Basic Metabolic Panel: Recent Labs  Lab 01/06/21 1624 01/07/21 0700 01/08/21 0414 01/10/21 0355  NA 141  141 141 144  K 4.4 4.4 3.9 3.5  CL 108 107 110 112*  CO2 25 25 25 24   GLUCOSE 109* 119* 105* 107*  BUN 25* 24* 31* 30*  CREATININE 1.09 0.95 0.87 0.77  CALCIUM 8.9 8.8* 8.8* 8.9  MG  --  2.1  --   --   PHOS  --  3.5  --   --    GFR: Estimated Creatinine Clearance: 66.7 mL/min (by C-G formula based on SCr of 0.77 mg/dL). Liver Function Tests: Recent Labs  Lab 01/06/21 1624 01/07/21 0700  AST 41 43*  ALT 35 35  ALKPHOS 100 90  BILITOT 0.7 0.7  PROT 6.8 6.6  ALBUMIN 2.9* 2.6*   No results for input(s): LIPASE, AMYLASE in the last 168 hours. No results for input(s): AMMONIA in the last 168 hours. Coagulation Profile: Recent Labs  Lab 01/06/21 1624  INR 1.2   Cardiac Enzymes: Recent Labs  Lab 01/09/21 0904 01/11/21 0533  CKTOTAL 680* 366   BNP (last 3 results) No results for input(s): PROBNP in the last 8760 hours. HbA1C: No results for input(s): HGBA1C in the last 72 hours. CBG: No results for input(s): GLUCAP in the last 168 hours. Lipid Profile: No results for input(s): CHOL, HDL, LDLCALC, TRIG, CHOLHDL, LDLDIRECT in the last 72 hours. Thyroid Function Tests: No results for input(s): TSH, T4TOTAL, FREET4, T3FREE, THYROIDAB in the last 72 hours. Anemia Panel: No results for input(s): VITAMINB12, FOLATE, FERRITIN, TIBC, IRON, RETICCTPCT in the last 72 hours. Sepsis Labs: Recent Labs  Lab 01/06/21 2021 01/07/21 1400  LATICACIDVEN 1.4 1.1    Recent Results (from the past 240 hour(s))  Culture, blood (routine x 2)     Status: Abnormal   Collection Time: 01/06/21  8:35 PM   Specimen: BLOOD  Result Value Ref Range Status   Specimen Description BLOOD RIGHT ANTECUBITAL  Final   Special Requests   Final    BOTTLES DRAWN AEROBIC AND ANAEROBIC Blood Culture adequate volume   Culture  Setup Time   Final    GRAM POSITIVE COCCI IN CLUSTERS IN BOTH AEROBIC AND ANAEROBIC BOTTLES Organism ID to follow CRITICAL RESULT CALLED TO, READ BACK BY AND VERIFIED WITH: Fayne Mediate, AT 1911 01/07/21  D. VANHOOK Performed at [facility], [place] [address], [place], [place] [address]    Culture METHICILLIN RESISTANT STAPHYLOCOCCUS AUREUS (A)  Final   Report Status 01/09/2021 FINAL  Final   Organism ID, Bacteria METHICILLIN RESISTANT STAPHYLOCOCCUS AUREUS  Final      Susceptibility   Methicillin resistant staphylococcus aureus - MIC*    CIPROFLOXACIN >=8 RESISTANT Resistant     ERYTHROMYCIN >=8 RESISTANT Resistant     GENTAMICIN <=0.5 SENSITIVE Sensitive     OXACILLIN >=4 RESISTANT Resistant     TETRACYCLINE <=1 SENSITIVE Sensitive     VANCOMYCIN <=0.5 SENSITIVE Sensitive     TRIMETH/SULFA >=320 RESISTANT Resistant     CLINDAMYCIN <=0.25 SENSITIVE Sensitive     RIFAMPIN <=0.5 SENSITIVE Sensitive     Inducible Clindamycin NEGATIVE Sensitive     * METHICILLIN RESISTANT STAPHYLOCOCCUS AUREUS  Blood Culture ID Panel (Reflexed)     Status: Abnormal   Collection Time: 01/06/21  8:35 PM  Result Value Ref Range Status   Enterococcus faecalis NOT DETECTED NOT DETECTED Final   Enterococcus Faecium NOT DETECTED NOT DETECTED Final   Listeria monocytogenes NOT DETECTED NOT DETECTED Final   Staphylococcus species DETECTED (A) NOT DETECTED Final    Comment: CRITICAL RESULT CALLED TO, READ BACK BY AND VERIFIED WITH: Salome Holmes PHARMD, AT 1911 01/07/21 D. VANHOOK    Staphylococcus aureus (BCID) DETECTED (A) NOT DETECTED Final    Comment: Methicillin (oxacillin)-resistant Staphylococcus aureus (MRSA). MRSA is predictably resistant to beta-lactam antibiotics (except ceftaroline). Preferred therapy is vancomycin unless clinically contraindicated. Patient requires contact precautions if  hospitalized. CRITICAL RESULT CALLED TO, READ BACK BY AND VERIFIED WITH: Salome Holmes PHARMD, AT 1911 01/07/21 D. VANHOOK    Staphylococcus epidermidis NOT DETECTED NOT DETECTED Final   Staphylococcus lugdunensis NOT DETECTED NOT DETECTED Final   Streptococcus species NOT DETECTED NOT  DETECTED Final   Streptococcus agalactiae NOT DETECTED NOT DETECTED Final   Streptococcus pneumoniae NOT DETECTED NOT DETECTED Final   Streptococcus pyogenes NOT DETECTED NOT DETECTED Final   A.calcoaceticus-baumannii NOT DETECTED NOT DETECTED Final   Bacteroides fragilis NOT DETECTED NOT DETECTED Final   Enterobacterales NOT DETECTED NOT DETECTED Final   Enterobacter cloacae complex NOT DETECTED NOT DETECTED Final   Escherichia coli NOT DETECTED NOT DETECTED Final   Klebsiella aerogenes NOT DETECTED NOT DETECTED Final   Klebsiella oxytoca NOT DETECTED NOT DETECTED Final   Klebsiella pneumoniae NOT DETECTED NOT DETECTED Final   Proteus species NOT DETECTED NOT DETECTED Final   Salmonella species NOT DETECTED NOT DETECTED Final   Serratia marcescens NOT DETECTED NOT DETECTED Final   Haemophilus influenzae NOT DETECTED NOT DETECTED Final   Neisseria meningitidis NOT DETECTED NOT DETECTED Final   Pseudomonas aeruginosa NOT DETECTED NOT DETECTED Final   Stenotrophomonas maltophilia NOT DETECTED NOT DETECTED Final   Candida albicans NOT DETECTED NOT DETECTED Final   Candida auris NOT DETECTED NOT DETECTED Final   Candida glabrata NOT DETECTED NOT DETECTED Final   Candida krusei NOT DETECTED NOT DETECTED Final   Candida parapsilosis NOT DETECTED NOT DETECTED Final   Candida tropicalis NOT DETECTED NOT DETECTED Final   Cryptococcus neoformans/gattii NOT DETECTED NOT DETECTED Final   Meth resistant mecA/C and MREJ DETECTED (A) NOT DETECTED Final    Comment: CRITICAL RESULT CALLED TO, READ BACK BY AND VERIFIED WITH: Fayne Mediate, AT 1911 01/07/21 Rush [place]mark Performed at [facility], [address] [place], [place] [address]   Culture, blood (routine x 2)  Status: None (Preliminary result)   Collection Time: 01/06/21  8:38 PM   Specimen: BLOOD RIGHT FOREARM  Result Value Ref Range Status   Specimen Description BLOOD RIGHT FOREARM  Final   Special Requests   Final    BOTTLES  DRAWN AEROBIC AND ANAEROBIC Blood Culture results may not be optimal due to an inadequate volume of blood received in culture bottles   Culture   Final    NO GROWTH 4 DAYS Performed at [facility], [place] [address] [place], [place] [address]    Report Status PENDING  Incomplete  Resp Panel by RT-PCR (Flu A&B, Covid) Urine, Catheterized     Status: None   Collection Time: 01/06/21  9:18 PM   Specimen: Urine, Catheterized; Nasopharyngeal(NP) swabs in vial transport medium  Result Value Ref Range Status   SARS Coronavirus 2 by RT PCR NEGATIVE NEGATIVE Final    Comment: (NOTE) SARS-CoV-2 target nucleic acids are NOT DETECTED.  The SARS-CoV-2 RNA is generally detectable in upper respiratory specimens during the acute phase of infection. The lowest concentration of SARS-CoV-2 viral copies this assay can detect is 138 copies/mL. A negative result does not preclude SARS-Cov-2 infection and should not be used as the sole basis for treatment or other patient management decisions. A negative result may occur with  improper specimen collection/handling, submission of specimen other than nasopharyngeal swab, presence of viral mutation(s) within the areas targeted by this assay, and inadequate number of viral copies(<138 copies/mL). A negative result must be combined with clinical observations, patient history, and epidemiological information. The expected result is Negative.  Fact Sheet for Patients:  EntrepreneurPulse.com.au  Fact Sheet for Healthcare Providers:  IncredibleEmployment.be  This test is no t yet approved or cleared by the [place] FDA and  has been authorized for detection and/or diagnosis of SARS-CoV-2 by FDA under an Emergency Use Authorization (EUA). This EUA will remain  in effect (meaning this test can be used) for the duration of the COVID-19 declaration under Section 564(b)(1) of the Act, 21 U.S.C.section 360bbb-3(b)(1),  unless the authorization is terminated  or revoked sooner.       Influenza A by PCR NEGATIVE NEGATIVE Final   Influenza B by PCR NEGATIVE NEGATIVE Final    Comment: (NOTE) The Xpert Xpress SARS-CoV-2/FLU/RSV plus assay is intended as an aid in the diagnosis of influenza from Nasopharyngeal swab specimens and should not be used as a sole basis for treatment. Nasal washings and aspirates are unacceptable for Xpert Xpress SARS-CoV-2/FLU/RSV testing.  Fact Sheet for Patients: EntrepreneurPulse.com.au  Fact Sheet for Healthcare Providers: IncredibleEmployment.be  This test is not yet approved or cleared by the [place] FDA and has been authorized for detection and/or diagnosis of SARS-CoV-2 by FDA under an Emergency Use Authorization (EUA). This EUA will remain in effect (meaning this test can be used) for the duration of the COVID-19 declaration under Section 564(b)(1) of the Act, 21 U.S.C. section 360bbb-3(b)(1), unless the authorization is terminated or revoked.  Performed at [facility], [place] [address], [place],  [address]   Urine culture     Status: Abnormal   Collection Time: 01/06/21  9:35 PM   Specimen: Urine, Random  Result Value Ref Range Status   Specimen Description URINE, RANDOM  Final   Special Requests   Final    NONE Performed at [facility], [place] [address], [place], [place] [address]    Culture 10,000 COLONIES/mL KLEBSIELLA PNEUMONIAE (A)  Final  Report Status 01/09/2021 FINAL  Final   Organism ID, Bacteria KLEBSIELLA PNEUMONIAE (A)  Final      Susceptibility   Klebsiella pneumoniae - MIC*    AMPICILLIN >=32 RESISTANT Resistant     CEFAZOLIN <=4 SENSITIVE Sensitive     CEFEPIME <=0.12 SENSITIVE Sensitive     CEFTRIAXONE <=0.25 SENSITIVE Sensitive     CIPROFLOXACIN <=0.25 SENSITIVE Sensitive     GENTAMICIN <=1 SENSITIVE Sensitive     IMIPENEM <=0.25 SENSITIVE Sensitive     NITROFURANTOIN 64  INTERMEDIATE Intermediate     TRIMETH/SULFA <=20 SENSITIVE Sensitive     AMPICILLIN/SULBACTAM 4 SENSITIVE Sensitive     PIP/TAZO <=4 SENSITIVE Sensitive     * 10,000 COLONIES/mL KLEBSIELLA PNEUMONIAE  Culture, blood (routine x 2)     Status: None (Preliminary result)   Collection Time: 01/08/21 10:22 AM   Specimen: BLOOD RIGHT FOREARM  Result Value Ref Range Status   Specimen Description BLOOD RIGHT FOREARM  Final   Special Requests   Final    BOTTLES DRAWN AEROBIC ONLY Blood Culture adequate volume   Culture   Final    NO GROWTH 2 DAYS Performed at [facility], [address], [place], [place] [address]    Report Status PENDING  Incomplete  Culture, blood (routine x 2)     Status: None (Preliminary result)   Collection Time: 01/08/21 10:23 AM   Specimen: BLOOD RIGHT FOREARM  Result Value Ref Range Status   Specimen Description BLOOD RIGHT FOREARM  Final   Special Requests   Final    BOTTLES DRAWN AEROBIC ONLY Blood Culture adequate volume   Culture   Final    NO GROWTH 2 DAYS Performed at [facility], [place] [address] [place],  [address]    Report Status PENDING  Incomplete     Radiology Studies: ECHOCARDIOGRAM LIMITED  Result Date: 01/09/2021    ECHOCARDIOGRAM LIMITED REPORT   Patient Name:   Jeffery Rogers Date of Exam: 01/09/2021 Medical Rec #:  [id number]       Height:       73.0 in Accession #:    [id number]      Weight:       160.0 lb Date of Birth:  [birth year]        BSA:          1.957 m Patient Age:    39 years        BP:           100/78 mmHg Patient Gender: M               HR:           61 bpm. Exam Location:  Inpatient Procedure: Limited Echo Indications:    Bacteremia R78.81  History:        Patient has prior history of Echocardiogram examinations, most                 recent 11/07/2020. Pacemaker, Arrythmias:Atrial Fibrillation; Risk                 Factors:Hypertension, Former Smoker and Dyslipidemia.                 Mixed Alzheimer's and vascular  dementia with behavior                 disturbances , S/P TAVR (transcatheter aortic valve replacement)  05/30/2018 23 mm Edwards Sapien 3 , [facility].  Sonographer:    Leavy Cella Referring Phys: [facility] [place]  1. Left ventricular ejection fraction, by estimation, is 60 to 65%. The left ventricle has normal function. The left ventricle has no regional wall motion abnormalities. Left ventricular diastolic parameters are indeterminate.  2. The mitral valve is degenerative. Mild to moderate mitral valve regurgitation. Severe mitral annular calcification.  3. The tricuspid valve is abnormal. Tricuspid valve regurgitation is moderate to severe.  4. There is a 23 mm Edwards Sapien prosthetic, stented (TAVR) valve present in the aortic position. Aortic valve regurgitation is mild to moderate. Aortic valve mean gradient measures 18.0 mmHg. FINDINGS  Left Ventricle: Left ventricular ejection fraction, by estimation, is 60 to 65%. The left ventricle has normal function. The left ventricle has no regional wall motion abnormalities. Left ventricular diastolic parameters are indeterminate. Pericardium: There is no evidence of pericardial effusion. Mitral Valve: The mitral valve is degenerative in appearance. Severe mitral annular calcification. Mild to moderate mitral valve regurgitation. Tricuspid Valve: The tricuspid valve is abnormal. Tricuspid valve regurgitation is moderate to severe. Aortic Valve: The aortic valve has been repaired/replaced. Aortic valve regurgitation is mild to moderate. Aortic valve mean gradient measures 18.0 mmHg. There is a 23 mm Edwards Sapien prosthetic, stented (TAVR) valve present in the aortic position. AORTIC VALVE AV Mean Grad: 18.0 mmHg MITRAL VALVE                TRICUSPID VALVE MV Area (PHT): 1.87 cm     TR Peak grad:   42.8 mmHg MV Decel Time: 405 msec     TR Vmax:        327.00 cm/s MV E velocity: 172.00 cm/s MV A velocity: 63.50 cm/s MV E/A ratio:   2.71 Oswaldo Milian MD Electronically signed by Oswaldo Milian MD Signature Date/Time: 01/09/2021/3:18:27 PM    Final      LOS: 4 days   Antonieta Pert, MD [facility]  01/11/2021, 7:51 AM

## 2021-01-12 DIAGNOSIS — A4102 Sepsis due to Methicillin resistant Staphylococcus aureus: Secondary | ICD-10-CM

## 2021-01-12 DIAGNOSIS — I330 Acute and subacute infective endocarditis: Secondary | ICD-10-CM

## 2021-01-12 DIAGNOSIS — F0151 Vascular dementia with behavioral disturbance: Secondary | ICD-10-CM

## 2021-01-12 DIAGNOSIS — T826XXA Infection and inflammatory reaction due to cardiac valve prosthesis, initial encounter: Secondary | ICD-10-CM

## 2021-01-12 DIAGNOSIS — B9562 Methicillin resistant Staphylococcus aureus infection as the cause of diseases classified elsewhere: Secondary | ICD-10-CM | POA: Diagnosis not present

## 2021-01-12 DIAGNOSIS — G309 Alzheimer's disease, unspecified: Secondary | ICD-10-CM

## 2021-01-12 DIAGNOSIS — L8990 Pressure ulcer of unspecified site, unspecified stage: Secondary | ICD-10-CM | POA: Insufficient documentation

## 2021-01-12 LAB — SARS CORONAVIRUS 2 (TAT 6-24 HRS): SARS Coronavirus 2: NEGATIVE

## 2021-01-12 NOTE — Progress Notes (Signed)
Manufacturing engineer [facility])  [facility] referral department received phone call from [facility] stating that Mr. Durflinger would need hospice support.  Noted that family did not want to discuss further with PMT about palliative services.  [facility] will discuss with [facility] manager in the am prior to engaging with the family to ensure that a referral is appropriate.  Venia Carbon RN, BSN, [facility] Liaison

## 2021-01-12 NOTE — Progress Notes (Signed)
PROGRESS NOTE    Jeffery Rogers  KKX:[id number] DOB: 08/19/1932 DOA: 01/06/2021 PCP: [name], MD   Chief Complaint  Patient presents with  . Altered Mental Status  . Foley Catheter Problem  Brief Narrative: 87 year old male with multiple comorbidities with advanced dementia, complete heart block/severe aortic stenosis with TAVR and PPM in place, persistent A. Fib, chronic Foley who pulled out his Foley catheter with some bleeding and found to have decreased urine output, outpatient replacement of Foley was attempted but could not be completed, he continued to have bloody drainage and was seen in the ED.  He recently completed nitrofurantoin for UTI.  Patient was admitted 01/06/21 found to have MRSA bacteremia. ID, cardiology was consulted-felt patient is not a candidate for TEE or redo aortic valve surgery or lead extraction advised palliative approach IV antibiotics.  Palliative care was also consulted patient is DNR/DNI. patient remains on IV antibiotic being followed by palliative care until further decision by family re further plan of care.  Subjective: Seen this morning mumbling words in fetal position one-to-one sitter at the bedside mitten in place  Afebrile overnight Assessment & Plan:  UTI  due to urinary indwelling Foley catheter MRSA bacteremia with severe sepsis  POA 2/2 #1: Difficult situation,2/2 TAVR and pacemaker, not a candidate for TEE and not a candidate for redo aortic valve surgery or lead extraction even if if he has infection in the TAVR or pacemaker as per cardiology, not personally TTE. Cardiology has advised palliative approach IV antibiotics.  Palliative care following-unfortunately family did not want to have further discussion with the palliative when PC called wife 4/10.I did call wife this morning she is looking into SNF with hospice and informed me that social worker at friend's home will coordinate with the "hospital"- I have alerted [facility] social worker to  discuss with hospice/Friend;'s home SNF.Patient wife mentioned that she will leave it to the hospice team to determine whether they can take him with antibiotics but she would be agreeable to not continue  if hospice recommends to stop. We will hold off on PICC line.Repeat blood culture sent 4/7 is negative so far.  Severe aortic stenosis S/P TAVR  Complete heart block status post pacemaker Persistent atrial fibrillation: Not on anticoagulation, pacemaker in place.  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  He has 24-hour private care giver normally.Continue his home  Remeron and Klonopin, Seroquel increased to 100 mg twice daily to stabilize his mood/agitation. Prn xanaxx.  Cont. Cont supportive measures, delirium precaution fall precaution.  Urinary retention status post replacement of Foley in the ED.  Discussed with Dr. Gloriann Loan advised to keep the Foley in place and outpatient follow-up he may need transition to suprapubic catheter on outpatient basis-does not recommend changing for now given his traumatic Foley.  HTN: BP stable.  Anemia of chronic disease hemoglobin stable.  Diet Order            DIET DYS 2 Room service appropriate? No; Fluid consistency: Nectar Thick  Diet effective now                 Nutrition Problem: Inadequate oral intake Etiology: chronic illness (Alzheimer's/dementia) Signs/Symptoms: per patient/family report Interventions: MVI,Magic cup,Other (Comment),Refer to RD note for recommendations Patient's Body mass index is 21.09 kg/m.  DVT prophylaxis: SCDs Start: 01/07/21 0023 Code Status:   Code Status: DNR Family Communication: plan of care discussed with caregiver.   Discussed  w/ nursing staff Palliative following closely.    Called and discussed with patient's wife regarding plan of care this morning.    Status is: Inpatient Remains inpatient appropriate  because:IV treatments appropriate due to intensity of illness or inability to take PO and Inpatient level of care appropriate due to severity of illness  Dispo: The patient is from: [facility]              Anticipated d/c is GX:[id number] home with hospice care hopefully tomorrow-ordered Covid swab, asked RN to remove the mitten for SNF dispo.              Patient currently is not medically stable to d/c.   Difficult to place patient No  Unresulted Labs (From admission, onward)          Start     Ordered   01/11/21 0500  CK  Every 72 hours,   R     Question:  Specimen collection method  Answer:  Lab=Lab collect   01/09/21 1508   Unscheduled  SARS CORONAVIRUS 2 (TAT 6-24 HRS) Nasopharyngeal Nasopharyngeal Swab  Once,   R       Question Answer Comment  Is this test for diagnosis or screening Screening   Symptomatic for COVID-19 as defined by CDC No   Hospitalized for COVID-19 No   Admitted to ICU for COVID-19 No   Previously tested for COVID-19 Yes   Resident in a congregate (group) care setting Yes   Employed in healthcare setting No   Has patient completed COVID vaccination(s) (2 doses of Pfizer/Moderna 1 dose of [employer]) Unknown      01/12/21 1046         Medications reviewed: Scheduled Meds: . acetaminophen  1,000 mg Oral TID  . Chlorhexidine Gluconate Cloth  6 each Topical Daily  . clonazePAM  0.5 mg Oral BID  . docusate sodium  100 mg Oral BID  . ipratropium  2 spray Each Nare QID  . mirtazapine  15 mg Oral QHS  . QUEtiapine  100 mg Oral BID AC & HS  . senna  1 tablet Oral BID  . tamsulosin  0.4 mg Oral Daily   Continuous Infusions: . DAPTOmycin (CUBICIN)  IV 600 mg (01/11/21 2247)    Consultants:see note  Procedures:see note  Antimicrobials: Anti-infectives (From admission, onward)   Start     Dose/Rate Route Frequency Ordered Stop   01/09/21 2000  DAPTOmycin (CUBICIN) 600 mg in sodium chloride 0.9 % IVPB        8 mg/kg  72.6 kg 124 mL/hr over 30  Minutes Intravenous Daily 01/09/21 0854 02/19/21 2359   01/07/21 2015  vancomycin (VANCOREADY) IVPB 1500 mg/300 mL  Status:  Discontinued        1,500 mg 150 mL/hr over 120 Minutes Intravenous Every 24 hours 01/07/21 1923 01/09/21 0854   01/07/21 1600  cefTRIAXone (ROCEPHIN) 1 g in sodium chloride 0.9 % 100 mL IVPB  Status:  Discontinued        1 g 200 mL/hr over 30 Minutes Intravenous Every 24 hours 01/07/21 0022 01/07/21 1932   01/06/21 2030  cefTRIAXone (ROCEPHIN) 1 g in sodium chloride 0.9 % 100 mL IVPB        1 g 200 mL/hr over 30 Minutes Intravenous  Once 01/06/21 2019 01/06/21 2102     Culture/Microbiology    Component Value Date/Time   SDES BLOOD RIGHT FOREARM 01/08/2021 1023   SPECREQUEST  01/08/2021 1023  BOTTLES DRAWN AEROBIC ONLY Blood Culture adequate volume   CULT  01/08/2021 1023    NO GROWTH 4 DAYS Performed at [facility], [place] [address], [place], [place] [address]    REPTSTATUS PENDING 01/08/2021 1023    Other culture-see note  Objective: Vitals: Today's Vitals   01/11/21 0900 01/11/21 1735 01/11/21 2115 01/12/21 0539  BP: 134/79 112/76 134/71 133/73  Pulse: 61 66 88 60  Resp: 17 19 19 16   Temp: 97.8 F (36.6 C) 98.3 F (36.8 C) 98.8 F (37.1 C) 98.5 F (36.9 C)  TempSrc:  Oral Oral Oral  SpO2: 100% 94% 99% 96%  Weight:      Height:      PainSc:   0-No pain     Intake/Output Summary (Last 24 hours) at 01/12/2021 1046 Last data filed at 01/12/2021 0600 Gross per 24 hour  Intake 282 ml  Output 500 ml  Net -218 ml   Filed Weights   01/06/21 1558 01/09/21 2141  Weight: 72.6 kg 72.5 kg   Weight change:   Intake/Output from previous day: 04/10 0701 - 04/11 0700 In: 282 [P.O.:220; IV Piggyback:62] Out: 500 [Urine:500] Intake/Output this shift: No intake/output data recorded. Filed Weights   01/06/21 1558 01/09/21 2141  Weight: 72.6 kg 72.5 kg    Examination: General exam: AAOx0 , NAD, weak appearing. HEENT:Oral mucosa moist,  Ear/Nose WNL grossly, dentition normal. Respiratory system: bilaterally diminished,no wheezing or crackles,no use of accessory muscle Cardiovascular system: S1 & S2 +, No JVD,. Gastrointestinal system: Abdomen soft, NT,ND, BS+ Nervous System:Alert, awake, moving extremities and grossly nonfocal Extremities: in fetal position no edema, distal peripheral pulses palpable.  Skin: No rashes,no icterus. MSK: Normal muscle bulk,tone, power. Foley catheter in place  Data Reviewed: I have personally reviewed following labs and imaging studies CBC: Recent Labs  Lab 01/06/21 1624 01/07/21 0700 01/08/21 0414 01/10/21 0355  WBC 13.5* 10.2 10.9* 9.7  NEUTROABS 11.7* 8.4*  --   --   HGB 10.7* 11.2* 11.2* 12.8*  HCT 33.5* 35.2* 35.1* 39.9  MCV 96.0 96.4 94.6 93.4  PLT 271 233 189 213   Basic Metabolic Panel: Recent Labs  Lab 01/06/21 1624 01/07/21 0700 01/08/21 0414 01/10/21 0355  NA 141 141 141 144  K 4.4 4.4 3.9 3.5  CL 108 107 110 112*  CO2 25 25 25 24   GLUCOSE 109* 119* 105* 107*  BUN 25* 24* 31* 30*  CREATININE 1.09 0.95 0.87 0.77  CALCIUM 8.9 8.8* 8.8* 8.9  MG  --  2.1  --   --   PHOS  --  3.5  --   --    GFR: Estimated Creatinine Clearance: 66.7 mL/min (by C-G formula based on SCr of 0.77 mg/dL). Liver Function Tests: Recent Labs  Lab 01/06/21 1624 01/07/21 0700  AST 41 43*  ALT 35 35  ALKPHOS 100 90  BILITOT 0.7 0.7  PROT 6.8 6.6  ALBUMIN 2.9* 2.6*   No results for input(s): LIPASE, AMYLASE in the last 168 hours. No results for input(s): AMMONIA in the last 168 hours. Coagulation Profile: Recent Labs  Lab 01/06/21 1624  INR 1.2   Cardiac Enzymes: Recent Labs  Lab 01/09/21 0904 01/11/21 0533  CKTOTAL 680* 366   BNP (last 3 results) No results for input(s): PROBNP in the last 8760 hours. HbA1C: No results for input(s): HGBA1C in the last 72 hours. CBG: No results for input(s): GLUCAP in the last 168 hours. Lipid Profile: No results for input(s):  CHOL, HDL, LDLCALC, TRIG, CHOLHDL, LDLDIRECT in the last 72 hours. Thyroid Function Tests: No results for input(s): TSH, T4TOTAL, FREET4, T3FREE, THYROIDAB in the last 72 hours. Anemia Panel: No results for input(s): VITAMINB12, FOLATE, FERRITIN, TIBC, IRON, RETICCTPCT in the last 72 hours. Sepsis Labs: Recent Labs  Lab 01/06/21 2021 01/07/21 1400  LATICACIDVEN 1.4 1.1    Recent Results (from the past 240 hour(s))  Culture, blood (routine x 2)     Status: Abnormal   Collection Time: 01/06/21  8:35 PM   Specimen: BLOOD  Result Value Ref Range Status   Specimen Description BLOOD RIGHT ANTECUBITAL  Final   Special Requests   Final    BOTTLES DRAWN AEROBIC AND ANAEROBIC Blood Culture adequate volume   Culture  Setup Time   Final    GRAM POSITIVE COCCI IN CLUSTERS IN BOTH AEROBIC AND ANAEROBIC BOTTLES Organism ID to follow CRITICAL RESULT CALLED TO, READ BACK BY AND VERIFIED WITH: Fayne Mediate, AT 1911 01/07/21 [facility] Performed at [facility], [place] [address], [place], [place] [address]    Culture METHICILLIN RESISTANT STAPHYLOCOCCUS AUREUS (A)  Final   Report Status 01/09/2021 FINAL  Final   Organism ID, Bacteria METHICILLIN RESISTANT STAPHYLOCOCCUS AUREUS  Final      Susceptibility   Methicillin resistant staphylococcus aureus - MIC*    CIPROFLOXACIN >=8 RESISTANT Resistant     ERYTHROMYCIN >=8 RESISTANT Resistant     GENTAMICIN <=0.5 SENSITIVE Sensitive     OXACILLIN >=4 RESISTANT Resistant     TETRACYCLINE <=1 SENSITIVE Sensitive     VANCOMYCIN <=0.5 SENSITIVE Sensitive     TRIMETH/SULFA >=320 RESISTANT Resistant     CLINDAMYCIN <=0.25 SENSITIVE Sensitive     RIFAMPIN <=0.5 SENSITIVE Sensitive     Inducible Clindamycin NEGATIVE Sensitive     * METHICILLIN RESISTANT STAPHYLOCOCCUS AUREUS  Blood Culture ID Panel (Reflexed)     Status: Abnormal   Collection Time: 01/06/21  8:35 PM  Result Value Ref Range Status   Enterococcus faecalis NOT DETECTED NOT DETECTED  Final   Enterococcus Faecium NOT DETECTED NOT DETECTED Final   Listeria monocytogenes NOT DETECTED NOT DETECTED Final   Staphylococcus species DETECTED (A) NOT DETECTED Final    Comment: CRITICAL RESULT CALLED TO, READ BACK BY AND VERIFIED WITH: Salome Holmes PHARMD, AT 1911 01/07/21 D. VANHOOK    Staphylococcus aureus (BCID) DETECTED (A) NOT DETECTED Final    Comment: Methicillin (oxacillin)-resistant Staphylococcus aureus (MRSA). MRSA is predictably resistant to beta-lactam antibiotics (except ceftaroline). Preferred therapy is vancomycin unless clinically contraindicated. Patient requires contact precautions if  hospitalized. CRITICAL RESULT CALLED TO, READ BACK BY AND VERIFIED WITH: Salome Holmes PHARMD, AT 1911 01/07/21 D. VANHOOK    Staphylococcus epidermidis NOT DETECTED NOT DETECTED Final   Staphylococcus lugdunensis NOT DETECTED NOT DETECTED Final   Streptococcus species NOT DETECTED NOT DETECTED Final   Streptococcus agalactiae NOT DETECTED NOT DETECTED Final   Streptococcus pneumoniae NOT DETECTED NOT DETECTED Final   Streptococcus pyogenes NOT DETECTED NOT DETECTED Final   A.calcoaceticus-baumannii NOT DETECTED NOT DETECTED Final   Bacteroides fragilis NOT DETECTED NOT DETECTED Final   Enterobacterales NOT DETECTED NOT DETECTED Final   Enterobacter cloacae complex NOT DETECTED NOT DETECTED Final   Escherichia coli NOT DETECTED NOT DETECTED Final   Klebsiella aerogenes NOT DETECTED NOT DETECTED Final   Klebsiella oxytoca NOT DETECTED NOT DETECTED Final   Klebsiella pneumoniae NOT DETECTED NOT DETECTED Final   Proteus species NOT DETECTED NOT DETECTED Final  Salmonella species NOT DETECTED NOT DETECTED Final   Serratia marcescens NOT DETECTED NOT DETECTED Final   Haemophilus influenzae NOT DETECTED NOT DETECTED Final   Neisseria meningitidis NOT DETECTED NOT DETECTED Final   Pseudomonas aeruginosa NOT DETECTED NOT DETECTED Final   Stenotrophomonas maltophilia NOT DETECTED NOT  DETECTED Final   Candida albicans NOT DETECTED NOT DETECTED Final   Candida auris NOT DETECTED NOT DETECTED Final   Candida glabrata NOT DETECTED NOT DETECTED Final   Candida krusei NOT DETECTED NOT DETECTED Final   Candida parapsilosis NOT DETECTED NOT DETECTED Final   Candida tropicalis NOT DETECTED NOT DETECTED Final   Cryptococcus neoformans/gattii NOT DETECTED NOT DETECTED Final   Meth resistant mecA/C and MREJ DETECTED (A) NOT DETECTED Final    Comment: CRITICAL RESULT CALLED TO, READ BACK BY AND VERIFIED WITH: Fayne Mediate, AT 1911 01/07/21 Rush [facility]mark Performed at [facility], [address], [place], [place] [address]   Culture, blood (routine x 2)     Status: None   Collection Time: 01/06/21  8:38 PM   Specimen: BLOOD RIGHT FOREARM  Result Value Ref Range Status   Specimen Description BLOOD RIGHT FOREARM  Final   Special Requests   Final    BOTTLES DRAWN AEROBIC AND ANAEROBIC Blood Culture results may not be optimal due to an inadequate volume of blood received in culture bottles   Culture   Final    NO GROWTH 5 DAYS Performed at [facility], [place] [address], [place], [place] [address]    Report Status 01/11/2021 FINAL  Final  Resp Panel by RT-PCR (Flu A&B, Covid) Urine, Catheterized     Status: None   Collection Time: 01/06/21  9:18 PM   Specimen: Urine, Catheterized; Nasopharyngeal(NP) swabs in vial transport medium  Result Value Ref Range Status   SARS Coronavirus 2 by RT PCR NEGATIVE NEGATIVE Final    Comment: (NOTE) SARS-CoV-2 target nucleic acids are NOT DETECTED.  The SARS-CoV-2 RNA is generally detectable in upper respiratory specimens during the acute phase of infection. The lowest concentration of SARS-CoV-2 viral copies this assay can detect is 138 copies/mL. A negative result does not preclude SARS-Cov-2 infection and should not be used as the sole basis for treatment or other patient management decisions. A negative result may occur with   improper specimen collection/handling, submission of specimen other than nasopharyngeal swab, presence of viral mutation(s) within the areas targeted by this assay, and inadequate number of viral copies(<138 copies/mL). A negative result must be combined with clinical observations, patient history, and epidemiological information. The expected result is Negative.  Fact Sheet for Patients:  EntrepreneurPulse.com.au  Fact Sheet for Healthcare Providers:  IncredibleEmployment.be  This test is no t yet approved or cleared by the [place] FDA and  has been authorized for detection and/or diagnosis of SARS-CoV-2 by FDA under an Emergency Use Authorization (EUA). This EUA will remain  in effect (meaning this test can be used) for the duration of the COVID-19 declaration under Section 564(b)(1) of the Act, 21 U.S.C.section 360bbb-3(b)(1), unless the authorization is terminated  or revoked sooner.       Influenza A by PCR NEGATIVE NEGATIVE Final   Influenza B by PCR NEGATIVE NEGATIVE Final    Comment: (NOTE) The Xpert Xpress SARS-CoV-2/FLU/RSV plus assay is intended as an aid in the diagnosis of influenza from Nasopharyngeal swab specimens and should not be used as a sole basis for treatment. Nasal washings and aspirates are unacceptable for Xpert  Xpress SARS-CoV-2/FLU/RSV testing.  Fact Sheet for Patients: EntrepreneurPulse.com.au  Fact Sheet for Healthcare Providers: IncredibleEmployment.be  This test is not yet approved or cleared by the [place] FDA and has been authorized for detection and/or diagnosis of SARS-CoV-2 by FDA under an Emergency Use Authorization (EUA). This EUA will remain in effect (meaning this test can be used) for the duration of the COVID-19 declaration under Section 564(b)(1) of the Act, 21 U.S.C. section 360bbb-3(b)(1), unless the authorization is terminated  or revoked.  Performed at [facility], [place] [address], [place], [place] [address]   Urine culture     Status: Abnormal   Collection Time: 01/06/21  9:35 PM   Specimen: Urine, Random  Result Value Ref Range Status   Specimen Description URINE, RANDOM  Final   Special Requests   Final    NONE Performed at [facility], [place] [address], [place], [place] [address]    Culture 10,000 COLONIES/mL KLEBSIELLA PNEUMONIAE (A)  Final   Report Status 01/09/2021 FINAL  Final   Organism ID, Bacteria KLEBSIELLA PNEUMONIAE (A)  Final      Susceptibility   Klebsiella pneumoniae - MIC*    AMPICILLIN >=32 RESISTANT Resistant     CEFAZOLIN <=4 SENSITIVE Sensitive     CEFEPIME <=0.12 SENSITIVE Sensitive     CEFTRIAXONE <=0.25 SENSITIVE Sensitive     CIPROFLOXACIN <=0.25 SENSITIVE Sensitive     GENTAMICIN <=1 SENSITIVE Sensitive     IMIPENEM <=0.25 SENSITIVE Sensitive     NITROFURANTOIN 64 INTERMEDIATE Intermediate     TRIMETH/SULFA <=20 SENSITIVE Sensitive     AMPICILLIN/SULBACTAM 4 SENSITIVE Sensitive     PIP/TAZO <=4 SENSITIVE Sensitive     * 10,000 COLONIES/mL KLEBSIELLA PNEUMONIAE  Culture, blood (routine x 2)     Status: None (Preliminary result)   Collection Time: 01/08/21 10:22 AM   Specimen: BLOOD RIGHT FOREARM  Result Value Ref Range Status   Specimen Description BLOOD RIGHT FOREARM  Final   Special Requests   Final    BOTTLES DRAWN AEROBIC ONLY Blood Culture adequate volume   Culture   Final    NO GROWTH 4 DAYS Performed at [facility], [address] [place], [place] [address]    Report Status PENDING  Incomplete  Culture, blood (routine x 2)     Status: None (Preliminary result)   Collection Time: 01/08/21 10:23 AM   Specimen: BLOOD RIGHT FOREARM  Result Value Ref Range Status   Specimen Description BLOOD RIGHT FOREARM  Final   Special Requests   Final    BOTTLES DRAWN AEROBIC ONLY Blood Culture adequate volume   Culture   Final    NO GROWTH 4  DAYS Performed at [facility], [place] [address] [place],  [address]    Report Status PENDING  Incomplete     Radiology Studies: No results found.   LOS: 5 days   Antonieta Pert, MD [facility]  01/12/2021, 10:46 AM

## 2021-01-12 NOTE — Progress Notes (Signed)
    [facility] for Infectious Disease    Date of Admission:  01/06/2021   Total days of antibiotics            ID: Jeffery Rogers is a 87 y.o. male with alzheimer's dementia with MRSA bacteremia with presumed endocarditis in setting of TAVR, cardiac device Principal Problem:   Sepsis due to methicillin resistant Staphylococcus aureus (MRSA) ([place]) Active Problems:   Severe aortic stenosis   S/P TAVR (transcatheter aortic valve replacement)   Persistent atrial fibrillation (HCC)   Mixed Alzheimer's and vascular dementia with behavior disturbances (HCC)   Acute lower UTI   HTN (hypertension)   UTI (urinary tract infection) due to urinary indwelling Foley catheter (HCC)   Sepsis (HCC)   UTI (urinary tract infection)   Pressure injury of skin    Subjective: Afebrile, he denies pain, or poor sleep. Answers to questions are limited  Medications:  . acetaminophen  1,000 mg Oral TID  . Chlorhexidine Gluconate Cloth  6 each Topical Daily  . clonazePAM  0.5 mg Oral BID  . docusate sodium  100 mg Oral BID  . ipratropium  2 spray Each Nare QID  . mirtazapine  15 mg Oral QHS  . QUEtiapine  100 mg Oral BID AC & HS  . senna  1 tablet Oral BID  . tamsulosin  0.4 mg Oral Daily    Objective: Vital signs in last 24 hours: Temp:  [98.3 F (36.8 C)-98.8 F (37.1 C)] 98.4 F (36.9 C) (04/11 1226) Pulse Rate:  [60-88] 60 (04/11 1226) Resp:  [16-19] 18 (04/11 1226) BP: (112-134)/(71-76) 131/72 (04/11 1226) SpO2:  [94 %-99 %] 96 % (04/11 1226) Physical Exam  Constitutional: He is oriented to person,  He appears well-developed and well-nourished. No distress.  HENT:  Mouth/Throat: Oropharynx is clear and moist. No oropharyngeal exudate.  Cardiovascular: Normal rate, regular rhythm and normal heart sounds. Exam reveals no gallop and no friction rub.  No murmur heard.  Pulmonary/Chest: Effort normal and breath sounds normal. No respiratory distress. He has no wheezes.  Abdominal: Soft.  Bowel sounds are normal. He exhibits no distension. There is no tenderness.  Gu= scrotal edema, foley in place Neurological: He is alert and oriented to person, only. contracted Skin: Skin is warm and dry. Scattered excoriation Psychiatric: pleasant, non-combative    Lab Results Recent Labs    01/10/21 0355  WBC 9.7  HGB 12.8*  HCT 39.9  NA 144  K 3.5  CL 112*  CO2 24  BUN 30*  CREATININE 0.77    Microbiology: 4/7 blood cx NGTD 4/5 urine cx klebsiella 10,000 CFU 4/5 blood cx MRSA  Studies/Results: No results found.   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 Will need weekly CK.   Therapeutic drug monitoring = CK at 366 yesterday, which has trended down. May have baseline low level CK due to being bedbound,   Advanced dementia = palliative care has been involved but patient's family still want to pursue abtx treatment, he is too high risk for aspiration, thus not a candidate for TEE.   We will do telephone/video follow up with family at  2-3 wk and 5-6 wk for follow up    [facility] Cell: [phone] Pager: [phone]  01/12/2021, 5:24 PM

## 2021-01-12 NOTE — TOC Initial Note (Addendum)
Transition of Care [facility]) - Initial/Assessment Note    Patient Details  Name: Jeffery Rogers MRN: [id number] Date of Birth: August 28, 1932  Transition of Care [facility]) CM/SW Contact:    Sable Feil, LCSW Phone Number: 01/12/2021, 1:18 PM  Clinical Narrative:  Talked with patient's wife-[name] by phone [phone]) regarding her husband, his current living arrangement and his discharge. Mrs. Bhola confirmed that her husband came to hospital from [facility] and will return there at discharge. Wife advised that she will be contacted once patient is ready for discharge.       Spoke with [place], Yates Decamp [phone]) regarding patient's upcoming discharge and being d/c'd with daptomycin. Ms. Hubbard Robinson will check on this medication as it is expensive and get back with CSW.            Expected Discharge Plan: [place] ([facility]) Barriers to Discharge: Continued Medical Work up   Patient Goals and CMS Choice Patient states their goals for this hospitalization and ongoing recovery are:: Per wife, patient will return to [facility], where he is a LTC resident [email or web address][email or web address] Compare Post Acute Care list provided to:: Other (Comment Required) (Not needed as patient from SNF) Choice offered to / list presented to : NA  Expected Discharge Plan and Services Expected Discharge Plan: [place] ([facility]) In-house Referral: Clinical Social Work     Living arrangements for the past 2 months: [place] ([place])                                     Prior Living Arrangements/Services Living arrangements for the past 2 months: [place] ([place]) Lives with:: Facility Resident Patient language and need for interpreter reviewed:: No Do you feel safe going back to the place where you live?: Yes      Need for Family Participation in Patient  Care: Yes (Comment) Care giver support system in place?: Yes (comment)   Criminal Activity/Legal Involvement Pertinent to Current Situation/Hospitalization: No - Comment as needed  Activities of Daily Living      Permission Sought/Granted Permission sought to share information with : Other (comment) (Patient not fully oriented, called wife) Permission granted to share information with : No              Emotional Assessment Appearance:: Other (Comment Required (Did not visit with patient, talked with wife by phone) Attitude/Demeanor/Rapport: Unable to Assess Affect (typically observed): Unable to Assess Orientation: : Oriented to Self Alcohol / Substance Use: Tobacco Use,Alcohol Use,Illicit Drugs (Per H&P, patient quit smoking and does not drink or use illicit drugs) Psych Involvement: No (comment)  Admission diagnosis:  Urinary retention [R33.9] Sepsis ([place]) [A41.9] Problem with Foley catheter, initial encounter ([place]) [P49.9XXA] Fever, unspecified fever cause [R50.9] UTI (urinary tract infection) [N39.0] Patient Active Problem List   Diagnosis Date Noted  . Sepsis due to methicillin resistant Staphylococcus aureus (MRSA) ([place]) 01/10/2021  . UTI (urinary tract infection) 01/07/2021  . Sepsis ([place]) 01/06/2021  . UTI (urinary tract infection) due to urinary indwelling Foley catheter ([place]) 01/05/2021  . Leukocytosis 12/24/2020  . Prediabetes 12/17/2020  . Dysphagia 11/26/2020  . Lumbar spinal stenosis 11/26/2020  . Gait abnormality 11/26/2020  . HTN (hypertension) 11/26/2020  . Adult failure to thrive 11/26/2020  . Osteoarthritis, generalized 11/26/2020  . Generalized weakness   .  Blood in urine 11/22/2020  . Acute lower UTI 11/22/2020  . Chronic indwelling Foley catheter 11/22/2020  . AMS (altered mental status) 11/21/2020  . Confusion 11/20/2020  . Late onset Alzheimer's disease with behavioral disturbance ([place]) 11/19/2019  . 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 Fall  03/07/2019  . Noise effect on both inner ears 10/06/2018  . 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 Presence of permanent cardiac pacemaker   . Persistent atrial fibrillation ([place])   . S/P TAVR (transcatheter aortic valve replacement) 05/30/2018  . Severe aortic stenosis   . Tendinopathy of left rotator cuff 12/19/2017  . Bilateral impacted cerumen 11/25/2017  . Presbycusis of both ears 11/25/2017  . Vasomotor rhinitis 11/25/2017  . Dizziness 05/15/2017  . Chronic right maxillary sinusitis 03/30/2017  . Rhinitis 03/30/2017  . Elevated CPK 02/18/2017  . Sinusitis 02/18/2017  . Peripheral neuropathy 02/18/2017  . Chronic maxillary sinusitis 02/18/2017  . Essential tremor 01/12/2017  . Orthostatic dizziness 01/12/2017  . Diplopia 01/12/2017  . First degree atrioventricular block 03/29/2013  . Hypercholesteremia   . Goiter    PCP:  [name], MD Pharmacy:   [facility], [place] [address] Phone: [phone] Fax: [phone]  [facility], [address] [place] [address] Phone: [phone] Fax: [phone]   Social Determinants of Health (SDOH) Interventions  No SDOH interventions requested or needed at this time  Readmission Risk Interventions Readmission Risk Prevention Plan 06/01/2018  Post Dischage Appt Complete  Medication Screening Complete  Transportation Screening Complete  PCP follow-up Complete  Some recent data might be hidden

## 2021-01-12 NOTE — [place] FL2 (Signed)
[place] MEDICAID FL2 LEVEL OF CARE SCREENING TOOL     IDENTIFICATION  Patient Name: Jeffery Rogers Birthdate: 05/06/1932 Sex: male Admission Date (Current Location): 01/06/2021  [facility] and [place] Number:  Herbalist and Address:  [facility], [place] [address] [place], [place] [address]      Provider Number: [id number]  Attending Physician Name and Address:  Antonieta Pert, MD  Relative Name and Phone Number:  [place] - wife; [phone]    Current Level of Care: Hospital Recommended Level of Care: [place] (Patient came to hospital from [facility]) Prior Approval Number:    Date Approved/Denied:   [place] Number: [id number]  Discharge Plan: SNF    Current Diagnoses: Patient Active Problem List   Diagnosis Date Noted  . Sepsis due to methicillin resistant Staphylococcus aureus (MRSA) ([place]) 01/10/2021  . UTI (urinary tract infection) 01/07/2021  . Sepsis ([place]) 01/06/2021  . UTI (urinary tract infection) due to urinary indwelling Foley catheter ([place]) 01/05/2021  . Leukocytosis 12/24/2020  . Prediabetes 12/17/2020  . Dysphagia 11/26/2020  . Lumbar spinal stenosis 11/26/2020  . Gait abnormality 11/26/2020  . HTN (hypertension) 11/26/2020  . Adult failure to thrive 11/26/2020  . Osteoarthritis, generalized 11/26/2020  . Generalized weakness   . Blood in urine 11/22/2020  . Acute lower UTI 11/22/2020  . Chronic indwelling Foley catheter 11/22/2020  . AMS (altered mental status) 11/21/2020  . Confusion 11/20/2020  . Late onset Alzheimer's disease with behavioral disturbance ([place]) 11/19/2019  . 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 Fall 03/07/2019  . Noise effect on both inner ears 10/06/2018  . 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 Presence of permanent cardiac pacemaker   . Persistent atrial fibrillation ([place])   . S/P TAVR (transcatheter aortic valve replacement)  05/30/2018  . Severe aortic stenosis   . Tendinopathy of left rotator cuff 12/19/2017  . Bilateral impacted cerumen 11/25/2017  . Presbycusis of both ears 11/25/2017  . Vasomotor rhinitis 11/25/2017  . Dizziness 05/15/2017  . Chronic right maxillary sinusitis 03/30/2017  . Rhinitis 03/30/2017  . Elevated CPK 02/18/2017  . Sinusitis 02/18/2017  . Peripheral neuropathy 02/18/2017  . Chronic maxillary sinusitis 02/18/2017  . Essential tremor 01/12/2017  . Orthostatic dizziness 01/12/2017  . Diplopia 01/12/2017  . First degree atrioventricular block 03/29/2013  . Hypercholesteremia   . Goiter     Orientation RESPIRATION BLADDER Height & Weight     Self  Normal Continent,Indwelling catheter Weight: 159 lb 13.3 oz (72.5 kg) Height:  6\' 1"  (185.4 cm)  BEHAVIORAL SYMPTOMS/MOOD NEUROLOGICAL BOWEL NUTRITION STATUS      Continent Diet (DYS 2)  AMBULATORY STATUS COMMUNICATION OF NEEDS Skin   Total Care (Patient did not ambulate with PT) Verbally Skin abrasions (Abrasion bilaeral leg, knee; Ecchymosis bilateral arm)                       Personal Care Assistance Level of Assistance  Bathing,Feeding,Dressing Bathing Assistance: Maximum assistance Feeding assistance: Maximum assistance Dressing Assistance: Maximum assistance     Functional Limitations Info  Sight,Hearing,Speech Sight Info: Impaired Hearing Info: Adequate Speech Info: Adequate    SPECIAL CARE FACTORS FREQUENCY  PT (By licensed PT),OT (By licensed OT),Speech therapy     PT Frequency: Evaluated at hospital 4/6. OT Frequency: Evaluated at hospital 4/6     Speech Therapy Frequency: Evaluated 4/7 and DYS 1 - nectar thick liquids  recommended      Contractures Contractures Info: Not present    Additional Factors Info  Code Status,Allergies Code Status Info: DNR Allergies Info: No known allergies           Current Medications (01/12/2021):  This is the current hospital active medication list Current  Facility-Administered Medications  Medication Dose Route Frequency Provider Last Rate Last Admin  . acetaminophen (TYLENOL) tablet 650 mg  650 mg Oral Q6H PRN Toy Baker, MD       Or  . acetaminophen (TYLENOL) suppository 650 mg  650 mg Rectal Q6H PRN Doutova, Anastassia, MD      . acetaminophen (TYLENOL) tablet 1,000 mg  1,000 mg Oral TID Lin Landsman, NP   1,000 mg at 01/12/21 1103  . ALPRAZolam Duanne Moron) tablet 0.5 mg  0.5 mg Oral TID PRN Lin Landsman, NP   0.5 mg at 01/11/21 2231  . Chlorhexidine Gluconate Cloth 2 % PADS 6 each  6 each Topical Daily Geradine Girt, DO   6 each at 01/11/21 0846  . clonazePAM (KLONOPIN) tablet 0.5 mg  0.5 mg Oral BID Toy Baker, MD   0.5 mg at 01/12/21 1103  . DAPTOmycin (CUBICIN) 600 mg in sodium chloride 0.9 % IVPB  8 mg/kg Intravenous Q2000 Kc, Ramesh, MD 124 mL/hr at 01/11/21 2247 600 mg at 01/11/21 2247  . docusate sodium (COLACE) capsule 100 mg  100 mg Oral BID Toy Baker, MD   100 mg at 01/10/21 2127  . ipratropium (ATROVENT) 0.06 % nasal spray 2 spray  2 spray Each Nare QID Toy Baker, MD   2 spray at 01/12/21 1103  . mirtazapine (REMERON) tablet 15 mg  15 mg Oral QHS Elmer Picker M, NP   15 mg at 01/11/21 2231  . morphine 2 MG/ML injection 2 mg  2 mg Intravenous Q4H PRN Toy Baker, MD   2 mg at 01/11/21 1522  . oxyCODONE-acetaminophen (PERCOCET/ROXICET) 5-325 MG per tablet 1-2 tablet  1-2 tablet Oral Q6H PRN Toy Baker, MD   1 tablet at 01/11/21 0844  . polyvinyl alcohol (LIQUIFILM TEARS) 1.4 % ophthalmic solution 1 drop  1 drop Both Eyes QID PRN Doutova, Anastassia, MD      . QUEtiapine (SEROQUEL) tablet 100 mg  100 mg Oral BID AC & HS Elmer Picker M, NP   100 mg at 01/12/21 1103  . Resource ThickenUp Clear   Oral PRN Antonieta Pert, MD   Given at 01/10/21 [phone]  . senna (SENOKOT) tablet 8.6 mg  1 tablet Oral BID Doutova, Anastassia, MD   8.6 mg at 01/12/21 1103  . tamsulosin (FLOMAX) capsule 0.4 mg   0.4 mg Oral Daily Doutova, Anastassia, MD   0.4 mg at 01/12/21 1103     Discharge Medications: Please see discharge summary for a list of discharge medications.  Relevant Imaging Results:  Relevant Lab Results:   Additional Information ss# [id number].  Sable Feil, LCSW

## 2021-01-12 NOTE — Discharge Summary (Signed)
Physician Discharge Summary  Jeffery Rogers UVO:[id number] DOB: [birth year] DOA: 01/06/2021  PCP: [name], MD  Admit date: 01/06/2021 Discharge date: 01/13/2021  Admitted From: Friend's home Disposition: Find some with hospice services  Recommendations for Outpatient Follow-up:  1. Follow up with PCP/hospice team at friend's home  Home Health:n  Equipment/Devices: none  Discharge Condition: Stable Code Status:   Code Status: DNR Diet recommendation:  Diet Order            Diet - low sodium heart healthy           DIET DYS 2 Room service appropriate? No; Fluid consistency: Nectar Thick  Diet effective now                 Brief/Interim Summary: 87 year old male with multiple comorbidities with advanced dementia, complete heart block/severe aortic stenosis with TAVR and PPM in place, persistent A. Fib, chronic Foley who pulled out his Foley catheter with some bleeding and found to have decreased urine output, outpatient replacement of Foley was attempted but could not be completed, he continued to have bloody drainage and was seen in the ED.  He recently completed nitrofurantoin for UTI.  Patient was admitted 01/06/21 found to have MRSA bacteremia. ID, cardiology was consulted-felt patient is not a candidate for TEE or redo aortic valve surgery or lead extraction advised palliative approach IV antibiotics.  Palliative care was also consulted patient is DNR/DNI. patient remains on IV antibiotic being followed by palliative care until further decision by family re further plan of care. Palliative care following-unfortunately family did not want to have further discussion with the palliative when PC called wife 4/10.I did call wife this morning she is looking into SNF with hospice and informed me that social worker at friend's home will coordinate with the "hospital"- I have alerted [facility] social worker to discuss with hospice/Friend;'s home SNF.Patient wife mentioned that she will leave it to  the hospice team to determine whether they can take him with antibiotics but she would be agreeable to not continue  if hospice recommends to stop.  Informed by the social worker  Lorriane Shire that hospice will take him at  [facility] and no antibiotics on return to friend's home. I did call patient's wife this morning communicated with her and she confirmed that plan is for him to go to friends home today with hospice  without IV antibiotics.  Discharge Diagnoses:  UTI  due to urinary indwelling Foley catheter MRSA bacteremia with severe sepsis  POA 2/2 #1: Difficult situation,2/2 TAVR and pacemaker, not a candidate for TEE and not a candidate for redo aortic valve surgery or lead extraction even if if he has infection in the TAVR or pacemaker as per cardiology, not personally TTE. Cardiology has advised palliative approach, patient was being managed IV antibiotics, ID WAS FOLLOWING.  After discussion with patient and family TOC TEAM PLAN IS FOR return to friend's home with hospice care, without antibiotics.  Called and discussed patient's wife this morning confirmed the disposition plan.   Severe aortic stenosis S/P TAVR  Complete heart block status post pacemaker Persistent atrial fibrillation: Not on anticoagulation, pacemaker in place.  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  He has 24-hour private care giver normally.Continue his home  Remeron and Klonopin, Seroquel increased to 100 mg twice daily to stabilize his mood/agitation. Cont. Cont supportive measures, delirium precaution fall precaution and continue hospice services  Urinary  retention status post replacement of Foley in the ED.  Discussed with Dr. Gloriann Loan advised to keep the Foley in place and outpatient follow-up he may need transition to suprapubic catheter on outpatient basis-does not recommend changing for now given his traumatic  Foley.  HTN: BP stable.  Anemia of chronic disease hemoglobin stable.  Pressure Ulcer: Pressure Injury 01/12/21 Sacrum Upper Unstageable - Full thickness tissue loss in which the base of the injury is covered by slough (yellow, tan, gray, green or brown) and/or eschar (tan, brown or black) in the wound bed. (Active)  01/12/21 1041  Location: Sacrum  Location Orientation: Upper  Staging: Unstageable - Full thickness tissue loss in which the base of the injury is covered by slough (yellow, tan, gray, green or brown) and/or eschar (tan, brown or black) in the wound bed.  Wound Description (Comments):   Present on Admission: unknown     Pressure Injury 01/12/21 Right;Medial Stage 2 -  Partial thickness loss of dermis presenting as a shallow open injury with a red, pink wound bed without slough. (Active)  01/12/21 1041  Location:   Location Orientation: Right;Medial  Staging: Stage 2 -  Partial thickness loss of dermis presenting as a shallow open injury with a red, pink wound bed without slough.  Wound Description (Comments):   Present on Admission:unknown   Consults:  Cardiology, palliative care, urology  Subjective: Alert awake oriented x0, one-to-one sitter at the bedside.  Patient is in fetal position, demented appears elderly frail Discharge Exam: Vitals:   01/12/21 2017 01/13/21 0444  BP: 126/71 113/60  Pulse: 60 (!) 59  Resp:  18  Temp: 97.8 F (36.6 C) 97.8 F (36.6 C)  SpO2: 95% 95%   General: Pt is alert, awake, oriented x0 not in acute distress, in fetal position Cardiovascular: RRR, S1/S2 +, no rubs, no gallops Respiratory: CTA bilaterally, no wheezing, no rhonchi Abdominal: Soft, NT, ND, bowel sounds + Extremities: no edema, no cyanosis  Discharge Instructions  Discharge Instructions    Diet - low sodium heart healthy   Complete by: As directed    DIET DYS 2 Room service appropriate? No; Fluid consistency: Nectar Thick  Diet effective now      Comments:  Assist with feeding; crush meds in puree   Discharge wound care:   Complete by: As directed    Partial thickness loss of dermis presenting as a shallow open injury with a red, pink wound bed without slough. <1 day  Pressure Injury 01/12/21 Sacrum Upper Unstageable - Full thickness tissue loss in which the base of the injury is covered by slough (yellow, tan, gray, green or brown) and/or eschar (tan, brown or black) in the wound bed.     Allergies as of 01/13/2021   No Known Allergies     Medication List    STOP taking these medications   amLODipine 5 MG tablet Commonly known as: NORVASC   nitrofurantoin (macrocrystal-monohydrate) 100 MG capsule Commonly known as: MACROBID     TAKE these medications   acetaminophen 500 MG tablet Commonly known as: TYLENOL Take 1,000 mg by mouth daily.   ALPRAZolam 0.25 MG tablet Commonly known as: XANAX Take 0.25 mg by mouth 3 (three) times daily as needed for anxiety.   clonazePAM 0.5 MG tablet Commonly known as: KLONOPIN Take 1 tablet (0.5 mg total) by mouth 2 (two) times daily.   ipratropium 0.03 % nasal spray Commonly known as: ATROVENT Place 2 sprays into both nostrils 4 (four) times daily as  needed for rhinitis.   mirtazapine 15 MG tablet Commonly known as: REMERON Take 15 mg by mouth daily.   oxyCODONE-acetaminophen 5-325 MG tablet Commonly known as: Percocet Take 1-2 tablets by mouth every 6 (six) hours as needed. What changed: reasons to take this   polyvinyl alcohol 1.4 % ophthalmic solution Commonly known as: LIQUIFILM TEARS Place 1 drop into both eyes 4 (four) times daily as needed for dry eyes.   QUEtiapine 100 MG tablet Commonly known as: SEROQUEL Take 1 tablet (100 mg total) by mouth 2 (two) times daily. What changed: when to take this   saccharomyces boulardii 250 MG capsule Commonly known as: FLORASTOR Take 250 mg by mouth 2 (two) times daily.   tamsulosin 0.4 MG Caps capsule Commonly known as: FLOMAX Take  1 capsule (0.4 mg total) by mouth daily.            Discharge Care Instructions  (From admission, onward)         Start     Ordered   01/13/21 0000  Discharge wound care:       Comments: Partial thickness loss of dermis presenting as a shallow open injury with a red, pink wound bed without slough. <1 day  Pressure Injury 01/12/21 Sacrum Upper Unstageable - Full thickness tissue loss in which the base of the injury is covered by slough (yellow, tan, gray, green or brown) and/or eschar (tan, brown or black) in the wound bed.   01/13/21 0959          Follow-up Information    [name], MD. Call in 3 days.   Specialty: Internal Medicine Contact information: [address] [place] [address] [phone]              No Known Allergies  The results of significant diagnostics from this hospitalization (including imaging, microbiology, ancillary and laboratory) are listed below for reference.    Microbiology: Recent Results (from the past 240 hour(s))  Culture, blood (routine x 2)     Status: Abnormal   Collection Time: 01/06/21  8:35 PM   Specimen: BLOOD  Result Value Ref Range Status   Specimen Description BLOOD RIGHT ANTECUBITAL  Final   Special Requests   Final    BOTTLES DRAWN AEROBIC AND ANAEROBIC Blood Culture adequate volume   Culture  Setup Time   Final    GRAM POSITIVE COCCI IN CLUSTERS IN BOTH AEROBIC AND ANAEROBIC BOTTLES Organism ID to follow CRITICAL RESULT CALLED TO, READ BACK BY AND VERIFIED WITH: Fayne Mediate, AT 1911 01/07/21 [facility] Performed at [facility], [place] [address], [place], [place] [address]    Culture METHICILLIN RESISTANT STAPHYLOCOCCUS AUREUS (A)  Final   Report Status 01/09/2021 FINAL  Final   Organism ID, Bacteria METHICILLIN RESISTANT STAPHYLOCOCCUS AUREUS  Final      Susceptibility   Methicillin resistant staphylococcus aureus - MIC*    CIPROFLOXACIN >=8 RESISTANT Resistant     ERYTHROMYCIN  >=8 RESISTANT Resistant     GENTAMICIN <=0.5 SENSITIVE Sensitive     OXACILLIN >=4 RESISTANT Resistant     TETRACYCLINE <=1 SENSITIVE Sensitive     VANCOMYCIN <=0.5 SENSITIVE Sensitive     TRIMETH/SULFA >=320 RESISTANT Resistant     CLINDAMYCIN <=0.25 SENSITIVE Sensitive     RIFAMPIN <=0.5 SENSITIVE Sensitive     Inducible Clindamycin NEGATIVE Sensitive     * METHICILLIN RESISTANT STAPHYLOCOCCUS AUREUS  Blood Culture ID Panel (Reflexed)     Status: Abnormal  Collection Time: 01/06/21  8:35 PM  Result Value Ref Range Status   Enterococcus faecalis NOT DETECTED NOT DETECTED Final   Enterococcus Faecium NOT DETECTED NOT DETECTED Final   Listeria monocytogenes NOT DETECTED NOT DETECTED Final   Staphylococcus species DETECTED (A) NOT DETECTED Final    Comment: CRITICAL RESULT CALLED TO, READ BACK BY AND VERIFIED WITH: Salome Holmes PHARMD, AT 1911 01/07/21 D. VANHOOK    Staphylococcus aureus (BCID) DETECTED (A) NOT DETECTED Final    Comment: Methicillin (oxacillin)-resistant Staphylococcus aureus (MRSA). MRSA is predictably resistant to beta-lactam antibiotics (except ceftaroline). Preferred therapy is vancomycin unless clinically contraindicated. Patient requires contact precautions if  hospitalized. CRITICAL RESULT CALLED TO, READ BACK BY AND VERIFIED WITH: Salome Holmes PHARMD, AT 1911 01/07/21 D. VANHOOK    Staphylococcus epidermidis NOT DETECTED NOT DETECTED Final   Staphylococcus lugdunensis NOT DETECTED NOT DETECTED Final   Streptococcus species NOT DETECTED NOT DETECTED Final   Streptococcus agalactiae NOT DETECTED NOT DETECTED Final   Streptococcus pneumoniae NOT DETECTED NOT DETECTED Final   Streptococcus pyogenes NOT DETECTED NOT DETECTED Final   A.calcoaceticus-baumannii NOT DETECTED NOT DETECTED Final   Bacteroides fragilis NOT DETECTED NOT DETECTED Final   Enterobacterales NOT DETECTED NOT DETECTED Final   Enterobacter cloacae complex NOT DETECTED NOT DETECTED Final   Escherichia  coli NOT DETECTED NOT DETECTED Final   Klebsiella aerogenes NOT DETECTED NOT DETECTED Final   Klebsiella oxytoca NOT DETECTED NOT DETECTED Final   Klebsiella pneumoniae NOT DETECTED NOT DETECTED Final   Proteus species NOT DETECTED NOT DETECTED Final   Salmonella species NOT DETECTED NOT DETECTED Final   Serratia marcescens NOT DETECTED NOT DETECTED Final   Haemophilus influenzae NOT DETECTED NOT DETECTED Final   Neisseria meningitidis NOT DETECTED NOT DETECTED Final   Pseudomonas aeruginosa NOT DETECTED NOT DETECTED Final   Stenotrophomonas maltophilia NOT DETECTED NOT DETECTED Final   Candida albicans NOT DETECTED NOT DETECTED Final   Candida auris NOT DETECTED NOT DETECTED Final   Candida glabrata NOT DETECTED NOT DETECTED Final   Candida krusei NOT DETECTED NOT DETECTED Final   Candida parapsilosis NOT DETECTED NOT DETECTED Final   Candida tropicalis NOT DETECTED NOT DETECTED Final   Cryptococcus neoformans/gattii NOT DETECTED NOT DETECTED Final   Meth resistant mecA/C and MREJ DETECTED (A) NOT DETECTED Final    Comment: CRITICAL RESULT CALLED TO, READ BACK BY AND VERIFIED WITH: Fayne Mediate, AT 1911 01/07/21 [facility] Performed at [facility], [address] [place], [place] [address]   Culture, blood (routine x 2)     Status: None   Collection Time: 01/06/21  8:38 PM   Specimen: BLOOD RIGHT FOREARM  Result Value Ref Range Status   Specimen Description BLOOD RIGHT FOREARM  Final   Special Requests   Final    BOTTLES DRAWN AEROBIC AND ANAEROBIC Blood Culture results may not be optimal due to an inadequate volume of blood received in culture bottles   Culture   Final    NO GROWTH 5 DAYS Performed at [facility], [place] [address], [place], [place] [address]    Report Status 01/11/2021 FINAL  Final  Resp Panel by RT-PCR (Flu A&B, Covid) Urine, Catheterized     Status: None   Collection Time: 01/06/21  9:18 PM   Specimen: Urine, Catheterized; Nasopharyngeal(NP)  swabs in vial transport medium  Result Value Ref Range Status   SARS Coronavirus 2 by RT PCR NEGATIVE NEGATIVE Final    Comment: (NOTE) SARS-CoV-2  target nucleic acids are NOT DETECTED.  The SARS-CoV-2 RNA is generally detectable in upper respiratory specimens during the acute phase of infection. The lowest concentration of SARS-CoV-2 viral copies this assay can detect is 138 copies/mL. A negative result does not preclude SARS-Cov-2 infection and should not be used as the sole basis for treatment or other patient management decisions. A negative result may occur with  improper specimen collection/handling, submission of specimen other than nasopharyngeal swab, presence of viral mutation(s) within the areas targeted by this assay, and inadequate number of viral copies(<138 copies/mL). A negative result must be combined with clinical observations, patient history, and epidemiological information. The expected result is Negative.  Fact Sheet for Patients:  EntrepreneurPulse.com.au  Fact Sheet for Healthcare Providers:  IncredibleEmployment.be  This test is no t yet approved or cleared by the [place] FDA and  has been authorized for detection and/or diagnosis of SARS-CoV-2 by FDA under an Emergency Use Authorization (EUA). This EUA will remain  in effect (meaning this test can be used) for the duration of the COVID-19 declaration under Section 564(b)(1) of the Act, 21 U.S.C.section 360bbb-3(b)(1), unless the authorization is terminated  or revoked sooner.       Influenza A by PCR NEGATIVE NEGATIVE Final   Influenza B by PCR NEGATIVE NEGATIVE Final    Comment: (NOTE) The Xpert Xpress SARS-CoV-2/FLU/RSV plus assay is intended as an aid in the diagnosis of influenza from Nasopharyngeal swab specimens and should not be used as a sole basis for treatment. Nasal washings and aspirates are unacceptable for Xpert Xpress  SARS-CoV-2/FLU/RSV testing.  Fact Sheet for Patients: EntrepreneurPulse.com.au  Fact Sheet for Healthcare Providers: IncredibleEmployment.be  This test is not yet approved or cleared by the [place] FDA and has been authorized for detection and/or diagnosis of SARS-CoV-2 by FDA under an Emergency Use Authorization (EUA). This EUA will remain in effect (meaning this test can be used) for the duration of the COVID-19 declaration under Section 564(b)(1) of the Act, 21 U.S.C. section 360bbb-3(b)(1), unless the authorization is terminated or revoked.  Performed at [facility], [place] [address], [place], [place] [address]   Urine culture     Status: Abnormal   Collection Time: 01/06/21  9:35 PM   Specimen: Urine, Random  Result Value Ref Range Status   Specimen Description URINE, RANDOM  Final   Special Requests   Final    NONE Performed at [facility], [place] [address], [place], [place] [address]    Culture 10,000 COLONIES/mL KLEBSIELLA PNEUMONIAE (A)  Final   Report Status 01/09/2021 FINAL  Final   Organism ID, Bacteria KLEBSIELLA PNEUMONIAE (A)  Final      Susceptibility   Klebsiella pneumoniae - MIC*    AMPICILLIN >=32 RESISTANT Resistant     CEFAZOLIN <=4 SENSITIVE Sensitive     CEFEPIME <=0.12 SENSITIVE Sensitive     CEFTRIAXONE <=0.25 SENSITIVE Sensitive     CIPROFLOXACIN <=0.25 SENSITIVE Sensitive     GENTAMICIN <=1 SENSITIVE Sensitive     IMIPENEM <=0.25 SENSITIVE Sensitive     NITROFURANTOIN 64 INTERMEDIATE Intermediate     TRIMETH/SULFA <=20 SENSITIVE Sensitive     AMPICILLIN/SULBACTAM 4 SENSITIVE Sensitive     PIP/TAZO <=4 SENSITIVE Sensitive     * 10,000 COLONIES/mL KLEBSIELLA PNEUMONIAE  Culture, blood (routine x 2)     Status: None   Collection Time: 01/08/21 10:22 AM   Specimen: BLOOD RIGHT FOREARM  Result Value Ref Range Status   Specimen Description  BLOOD RIGHT FOREARM  Final   Special Requests   Final     BOTTLES DRAWN AEROBIC ONLY Blood Culture adequate volume   Culture   Final    NO GROWTH 5 DAYS Performed at [facility], [address] [place], [place] [address]    Report Status 01/13/2021 FINAL  Final  Culture, blood (routine x 2)     Status: None   Collection Time: 01/08/21 10:23 AM   Specimen: BLOOD RIGHT FOREARM  Result Value Ref Range Status   Specimen Description BLOOD RIGHT FOREARM  Final   Special Requests   Final    BOTTLES DRAWN AEROBIC ONLY Blood Culture adequate volume   Culture   Final    NO GROWTH 5 DAYS Performed at [facility], [place] [address] [place], [place] [address]    Report Status 01/13/2021 FINAL  Final  SARS CORONAVIRUS 2 (TAT 6-24 HRS) Nasopharyngeal Nasopharyngeal Swab     Status: None   Collection Time: 01/12/21 10:47 AM   Specimen: Nasopharyngeal Swab  Result Value Ref Range Status   SARS Coronavirus 2 NEGATIVE NEGATIVE Final    Comment: (NOTE) SARS-CoV-2 target nucleic acids are NOT DETECTED.  The SARS-CoV-2 RNA is generally detectable in upper and lower respiratory specimens during the acute phase of infection. Negative results do not preclude SARS-CoV-2 infection, do not rule out co-infections with other pathogens, and should not be used as the sole basis for treatment or other patient management decisions. Negative results must be combined with clinical observations, patient history, and epidemiological information. The expected result is Negative.  Fact Sheet for Patients: SugarRoll.be  Fact Sheet for Healthcare Providers: https://www.woods-mathews.com/  This test is not yet approved or cleared by the [place] FDA and  has been authorized for detection and/or diagnosis of SARS-CoV-2 by FDA under an Emergency Use Authorization (EUA). This EUA will remain  in effect (meaning this test can be used) for the duration of the COVID-19 declaration under Se ction 564(b)(1) of the Act,  21 U.S.C. section 360bbb-3(b)(1), unless the authorization is terminated or revoked sooner.  Performed at [facility], [place] [address] [place], [place] [address]     Procedures/Studies: ECHOCARDIOGRAM LIMITED  Result Date: 01/09/2021    ECHOCARDIOGRAM LIMITED REPORT   Patient Name:   Jeffery Rogers Date of Exam: 01/09/2021 Medical Rec #:  [id number]       Height:       73.0 in Accession #:    [id number]      Weight:       160.0 lb Date of Birth:  02-23-1932        BSA:          1.957 m Patient Age:    43 years        BP:           100/78 mmHg Patient Gender: M               HR:           61 bpm. Exam Location:  Inpatient Procedure: Limited Echo Indications:    Bacteremia R78.81  History:        Patient has prior history of Echocardiogram examinations, most                 recent 11/07/2020. Pacemaker, Arrythmias:Atrial Fibrillation; Risk                 Factors:Hypertension, Former Smoker and Dyslipidemia.  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  Sonographer:    Leavy Cella Referring Phys: [place]  1. Left ventricular ejection fraction, by estimation, is 60 to 65%. The left ventricle has normal function. The left ventricle has no regional wall motion abnormalities. Left ventricular diastolic parameters are indeterminate.  2. The mitral valve is degenerative. Mild to moderate mitral valve regurgitation. Severe mitral annular calcification.  3. The tricuspid valve is abnormal. Tricuspid valve regurgitation is moderate to severe.  4. There is a 23 mm Edwards Sapien prosthetic, stented (TAVR) valve present in the aortic position. Aortic valve regurgitation is mild to moderate. Aortic valve mean gradient measures 18.0 mmHg. FINDINGS  Left Ventricle: Left ventricular ejection fraction, by estimation, is 60 to 65%.  The left ventricle has normal function. The left ventricle has no regional wall motion abnormalities. Left ventricular diastolic parameters are indeterminate. Pericardium: There is no evidence of pericardial effusion. Mitral Valve: The mitral valve is degenerative in appearance. Severe mitral annular calcification. Mild to moderate mitral valve regurgitation. Tricuspid Valve: The tricuspid valve is abnormal. Tricuspid valve regurgitation is moderate to severe. Aortic Valve: The aortic valve has been repaired/replaced. Aortic valve regurgitation is mild to moderate. Aortic valve mean gradient measures 18.0 mmHg. There is a 23 mm Edwards Sapien prosthetic, stented (TAVR) valve present in the aortic position. AORTIC VALVE AV Mean Grad: 18.0 mmHg MITRAL VALVE                TRICUSPID VALVE MV Area (PHT): 1.87 cm     TR Peak grad:   42.8 mmHg MV Decel Time: 405 msec     TR Vmax:        327.00 cm/s MV E velocity: 172.00 cm/s MV A velocity: 63.50 cm/s MV E/A ratio:  2.71 Oswaldo Milian MD Electronically signed by Oswaldo Milian MD Signature Date/Time: 01/09/2021/3:18:27 PM    Final     Labs: BNP (last 3 results) No results for input(s): BNP in the last 8760 hours. Basic Metabolic Panel: Recent Labs  Lab 01/06/21 1624 01/07/21 0700 01/08/21 0414 01/10/21 0355  NA 141 141 141 144  K 4.4 4.4 3.9 3.5  CL 108 107 110 112*  CO2 25 25 25 24   GLUCOSE 109* 119* 105* 107*  BUN 25* 24* 31* 30*  CREATININE 1.09 0.95 0.87 0.77  CALCIUM 8.9 8.8* 8.8* 8.9  MG  --  2.1  --   --   PHOS  --  3.5  --   --    Liver Function Tests: Recent Labs  Lab 01/06/21 1624 01/07/21 0700  AST 41 43*  ALT 35 35  ALKPHOS 100 90  BILITOT 0.7 0.7  PROT 6.8 6.6  ALBUMIN 2.9* 2.6*   No results for input(s): LIPASE, AMYLASE in the last 168 hours. No results for input(s): AMMONIA in the last 168 hours. CBC: Recent Labs  Lab 01/06/21 1624 01/07/21 0700 01/08/21 0414 01/10/21 0355  WBC 13.5* 10.2 10.9* 9.7   NEUTROABS 11.7* 8.4*  --   --   HGB 10.7* 11.2* 11.2* 12.8*  HCT 33.5* 35.2* 35.1* 39.9  MCV 96.0 96.4 94.6 93.4  PLT 271 233 189 240   Cardiac Enzymes: Recent  Labs  Lab 01/09/21 0904 01/11/21 0533  CKTOTAL 680* 366   BNP: Invalid input(s): POCBNP CBG: No results for input(s): GLUCAP in the last 168 hours. D-Dimer No results for input(s): DDIMER in the last 72 hours. Hgb A1c No results for input(s): HGBA1C in the last 72 hours. Lipid Profile No results for input(s): CHOL, HDL, LDLCALC, TRIG, CHOLHDL, LDLDIRECT in the last 72 hours. Thyroid function studies No results for input(s): TSH, T4TOTAL, T3FREE, THYROIDAB in the last 72 hours.  Invalid input(s): FREET3 Anemia work up No results for input(s): VITAMINB12, FOLATE, FERRITIN, TIBC, IRON, RETICCTPCT in the last 72 hours. Urinalysis    Component Value Date/Time   COLORURINE AMBER (A) 01/06/2021 1940   APPEARANCEUR HAZY (A) 01/06/2021 1940   LABSPEC 1.018 01/06/2021 1940   PHURINE 5.0 01/06/2021 1940   GLUCOSEU NEGATIVE 01/06/2021 1940   HGBUR LARGE (A) 01/06/2021 1940   BILIRUBINUR NEGATIVE 01/06/2021 [place] NEGATIVE 01/06/2021 1940   PROTEINUR 30 (A) 01/06/2021 1940   NITRITE NEGATIVE 01/06/2021 1940   LEUKOCYTESUR SMALL (A) 01/06/2021 1940   [facility] Invalid input(s): PROCALCITONIN,  WBC,  LACTICIDVEN Microbiology Recent Results (from the past 240 hour(s))  Culture, blood (routine x 2)     Status: Abnormal   Collection Time: 01/06/21  8:35 PM   Specimen: BLOOD  Result Value Ref Range Status   Specimen Description BLOOD RIGHT ANTECUBITAL  Final   Special Requests   Final    BOTTLES DRAWN AEROBIC AND ANAEROBIC Blood Culture adequate volume   Culture  Setup Time   Final    GRAM POSITIVE COCCI IN CLUSTERS IN BOTH AEROBIC AND ANAEROBIC BOTTLES Organism ID to follow CRITICAL RESULT CALLED TO, READ BACK BY AND VERIFIED WITH: Fayne Mediate, AT 1911 01/07/21 [facility] Performed at [facility], [place] [address], [place], [place] [address]    Culture METHICILLIN RESISTANT STAPHYLOCOCCUS AUREUS (A)  Final   Report Status 01/09/2021 FINAL  Final   Organism ID, Bacteria METHICILLIN RESISTANT STAPHYLOCOCCUS AUREUS  Final      Susceptibility   Methicillin resistant staphylococcus aureus - MIC*    CIPROFLOXACIN >=8 RESISTANT Resistant     ERYTHROMYCIN >=8 RESISTANT Resistant     GENTAMICIN <=0.5 SENSITIVE Sensitive     OXACILLIN >=4 RESISTANT Resistant     TETRACYCLINE <=1 SENSITIVE Sensitive     VANCOMYCIN <=0.5 SENSITIVE Sensitive     TRIMETH/SULFA >=320 RESISTANT Resistant     CLINDAMYCIN <=0.25 SENSITIVE Sensitive     RIFAMPIN <=0.5 SENSITIVE Sensitive     Inducible Clindamycin NEGATIVE Sensitive     * METHICILLIN RESISTANT STAPHYLOCOCCUS AUREUS  Blood Culture ID Panel (Reflexed)     Status: Abnormal   Collection Time: 01/06/21  8:35 PM  Result Value Ref Range Status   Enterococcus faecalis NOT DETECTED NOT DETECTED Final   Enterococcus Faecium NOT DETECTED NOT DETECTED Final   Listeria monocytogenes NOT DETECTED NOT DETECTED Final   Staphylococcus species DETECTED (A) NOT DETECTED Final    Comment: CRITICAL RESULT CALLED TO, READ BACK BY AND VERIFIED WITH: Salome Holmes PHARMD, AT 1911 01/07/21 D. VANHOOK    Staphylococcus aureus (BCID) DETECTED (A) NOT DETECTED Final    Comment: Methicillin (oxacillin)-resistant Staphylococcus aureus (MRSA). MRSA is predictably resistant to beta-lactam antibiotics (except ceftaroline). Preferred therapy is vancomycin unless clinically contraindicated. Patient requires contact precautions if  hospitalized. CRITICAL RESULT CALLED TO, READ BACK BY AND VERIFIED WITH: Salome Holmes PHARMD, AT 1911 01/07/21 D. Victoriano Lain  Staphylococcus epidermidis NOT DETECTED NOT DETECTED Final   Staphylococcus lugdunensis NOT DETECTED NOT DETECTED Final   Streptococcus species NOT DETECTED NOT DETECTED Final   Streptococcus agalactiae NOT DETECTED NOT DETECTED  Final   Streptococcus pneumoniae NOT DETECTED NOT DETECTED Final   Streptococcus pyogenes NOT DETECTED NOT DETECTED Final   A.calcoaceticus-baumannii NOT DETECTED NOT DETECTED Final   Bacteroides fragilis NOT DETECTED NOT DETECTED Final   Enterobacterales NOT DETECTED NOT DETECTED Final   Enterobacter cloacae complex NOT DETECTED NOT DETECTED Final   Escherichia coli NOT DETECTED NOT DETECTED Final   Klebsiella aerogenes NOT DETECTED NOT DETECTED Final   Klebsiella oxytoca NOT DETECTED NOT DETECTED Final   Klebsiella pneumoniae NOT DETECTED NOT DETECTED Final   Proteus species NOT DETECTED NOT DETECTED Final   Salmonella species NOT DETECTED NOT DETECTED Final   Serratia marcescens NOT DETECTED NOT DETECTED Final   Haemophilus influenzae NOT DETECTED NOT DETECTED Final   Neisseria meningitidis NOT DETECTED NOT DETECTED Final   Pseudomonas aeruginosa NOT DETECTED NOT DETECTED Final   Stenotrophomonas maltophilia NOT DETECTED NOT DETECTED Final   Candida albicans NOT DETECTED NOT DETECTED Final   Candida auris NOT DETECTED NOT DETECTED Final   Candida glabrata NOT DETECTED NOT DETECTED Final   Candida krusei NOT DETECTED NOT DETECTED Final   Candida parapsilosis NOT DETECTED NOT DETECTED Final   Candida tropicalis NOT DETECTED NOT DETECTED Final   Cryptococcus neoformans/gattii NOT DETECTED NOT DETECTED Final   Meth resistant mecA/C and MREJ DETECTED (A) NOT DETECTED Final    Comment: CRITICAL RESULT CALLED TO, READ BACK BY AND VERIFIED WITH: Fayne Mediate, AT 1911 01/07/21 [facility] Performed at [facility], [address] [place], [place] [address]   Culture, blood (routine x 2)     Status: None   Collection Time: 01/06/21  8:38 PM   Specimen: BLOOD RIGHT FOREARM  Result Value Ref Range Status   Specimen Description BLOOD RIGHT FOREARM  Final   Special Requests   Final    BOTTLES DRAWN AEROBIC AND ANAEROBIC Blood Culture results may not be optimal due to an inadequate  volume of blood received in culture bottles   Culture   Final    NO GROWTH 5 DAYS Performed at [facility], [place] [address], [place], [place] [address]    Report Status 01/11/2021 FINAL  Final  Resp Panel by RT-PCR (Flu A&B, Covid) Urine, Catheterized     Status: None   Collection Time: 01/06/21  9:18 PM   Specimen: Urine, Catheterized; Nasopharyngeal(NP) swabs in vial transport medium  Result Value Ref Range Status   SARS Coronavirus 2 by RT PCR NEGATIVE NEGATIVE Final    Comment: (NOTE) SARS-CoV-2 target nucleic acids are NOT DETECTED.  The SARS-CoV-2 RNA is generally detectable in upper respiratory specimens during the acute phase of infection. The lowest concentration of SARS-CoV-2 viral copies this assay can detect is 138 copies/mL. A negative result does not preclude SARS-Cov-2 infection and should not be used as the sole basis for treatment or other patient management decisions. A negative result may occur with  improper specimen collection/handling, submission of specimen other than nasopharyngeal swab, presence of viral mutation(s) within the areas targeted by this assay, and inadequate number of viral copies(<138 copies/mL). A negative result must be combined with clinical observations, patient history, and epidemiological information. The expected result is Negative.  Fact Sheet for Patients:  EntrepreneurPulse.com.au  Fact Sheet for Healthcare Providers:  IncredibleEmployment.be  This test is  no t yet approved or cleared by the [place] and  has been authorized for detection and/or diagnosis of SARS-CoV-2 by FDA under an Emergency Use Authorization (EUA). This EUA will remain  in effect (meaning this test can be used) for the duration of the COVID-19 declaration under Section 564(b)(1) of the Act, 21 U.S.C.section 360bbb-3(b)(1), unless the authorization is terminated  or revoked sooner.       Influenza A by PCR  NEGATIVE NEGATIVE Final   Influenza B by PCR NEGATIVE NEGATIVE Final    Comment: (NOTE) The Xpert Xpress SARS-CoV-2/FLU/RSV plus assay is intended as an aid in the diagnosis of influenza from Nasopharyngeal swab specimens and should not be used as a sole basis for treatment. Nasal washings and aspirates are unacceptable for Xpert Xpress SARS-CoV-2/FLU/RSV testing.  Fact Sheet for Patients: EntrepreneurPulse.com.au  Fact Sheet for Healthcare Providers: IncredibleEmployment.be  This test is not yet approved or cleared by the [place] FDA and has been authorized for detection and/or diagnosis of SARS-CoV-2 by FDA under an Emergency Use Authorization (EUA). This EUA will remain in effect (meaning this test can be used) for the duration of the COVID-19 declaration under Section 564(b)(1) of the Act, 21 U.S.C. section 360bbb-3(b)(1), unless the authorization is terminated or revoked.  Performed at [facility], [place] [address], [place], [place] [address]   Urine culture     Status: Abnormal   Collection Time: 01/06/21  9:35 PM   Specimen: Urine, Random  Result Value Ref Range Status   Specimen Description URINE, RANDOM  Final   Special Requests   Final    NONE Performed at [facility], [place] [address], [place], [place] [address]    Culture 10,000 COLONIES/mL KLEBSIELLA PNEUMONIAE (A)  Final   Report Status 01/09/2021 FINAL  Final   Organism ID, Bacteria KLEBSIELLA PNEUMONIAE (A)  Final      Susceptibility   Klebsiella pneumoniae - MIC*    AMPICILLIN >=32 RESISTANT Resistant     CEFAZOLIN <=4 SENSITIVE Sensitive     CEFEPIME <=0.12 SENSITIVE Sensitive     CEFTRIAXONE <=0.25 SENSITIVE Sensitive     CIPROFLOXACIN <=0.25 SENSITIVE Sensitive     GENTAMICIN <=1 SENSITIVE Sensitive     IMIPENEM <=0.25 SENSITIVE Sensitive     NITROFURANTOIN 64 INTERMEDIATE Intermediate     TRIMETH/SULFA <=20 SENSITIVE Sensitive      AMPICILLIN/SULBACTAM 4 SENSITIVE Sensitive     PIP/TAZO <=4 SENSITIVE Sensitive     * 10,000 COLONIES/mL KLEBSIELLA PNEUMONIAE  Culture, blood (routine x 2)     Status: None   Collection Time: 01/08/21 10:22 AM   Specimen: BLOOD RIGHT FOREARM  Result Value Ref Range Status   Specimen Description BLOOD RIGHT FOREARM  Final   Special Requests   Final    BOTTLES DRAWN AEROBIC ONLY Blood Culture adequate volume   Culture   Final    NO GROWTH 5 DAYS Performed at [facility], [address] [place], [place] [address]    Report Status 01/13/2021 FINAL  Final  Culture, blood (routine x 2)     Status: None   Collection Time: 01/08/21 10:23 AM   Specimen: BLOOD RIGHT FOREARM  Result Value Ref Range Status   Specimen Description BLOOD RIGHT FOREARM  Final   Special Requests   Final    BOTTLES DRAWN AEROBIC ONLY Blood Culture adequate volume   Culture   Final    NO GROWTH 5 DAYS Performed at [facility], [place] [address], [place], [place] [address]    Report Status 01/13/2021 FINAL  Final  SARS CORONAVIRUS 2 (TAT 6-24 HRS) Nasopharyngeal Nasopharyngeal Swab     Status: None   Collection Time: 01/12/21 10:47 AM   Specimen: Nasopharyngeal Swab  Result Value Ref Range Status   SARS Coronavirus 2 NEGATIVE NEGATIVE Final    Comment: (NOTE) SARS-CoV-2 target nucleic acids are NOT DETECTED.  The SARS-CoV-2 RNA is generally detectable in upper and lower respiratory specimens during the acute phase of infection. Negative results do not preclude SARS-CoV-2 infection, do not rule out co-infections with other pathogens, and should not be used as the sole basis for treatment or other patient management decisions. Negative results must be combined with clinical observations, patient history, and epidemiological information. The expected result is Negative.  Fact Sheet for Patients: SugarRoll.be  Fact Sheet for Healthcare  Providers: https://www.woods-mathews.com/  This test is not yet approved or cleared by the [place] FDA and  has been authorized for detection and/or diagnosis of SARS-CoV-2 by FDA under an Emergency Use Authorization (EUA). This EUA will remain  in effect (meaning this test can be used) for the duration of the COVID-19 declaration under Se ction 564(b)(1) of the Act, 21 U.S.C. section 360bbb-3(b)(1), unless the authorization is terminated or revoked sooner.  Performed at [facility], [place] [address], [place], [place] [address]      Time coordinating discharge: 35 minutes  SIGNED: Antonieta Pert, MD  [facility] 01/13/2021, 10:04 AM  If 7PM-7AM, please contact night-coverage [email or web address]

## 2021-01-13 ENCOUNTER — Other Ambulatory Visit: Payer: Self-pay | Admitting: Internal Medicine

## 2021-01-13 LAB — CULTURE, BLOOD (ROUTINE X 2)
Culture: NO GROWTH
Culture: NO GROWTH
Special Requests: ADEQUATE
Special Requests: ADEQUATE

## 2021-01-13 MED ORDER — QUETIAPINE FUMARATE 100 MG PO TABS: 100.0000 mg | ORAL_TABLET | Freq: Two times a day (BID) | ORAL | Status: AC

## 2021-01-13 MED ORDER — MORPHINE SULFATE (CONCENTRATE) 20 MG/ML PO SOLN: 5.0000 mg | ORAL | 0 refills | Status: AC | PRN

## 2021-01-13 NOTE — Plan of Care (Signed)
  Problem: Education: Goal: Knowledge of General Education information will improve Description: Including pain rating scale, medication(s)/side effects and non-pharmacologic comfort measures Outcome: Progressing   Problem: Health Behavior/Discharge Planning: Goal: Ability to manage health-related needs will improve Outcome: Progressing   Problem: Clinical Measurements: Goal: Ability to maintain clinical measurements within normal limits will improve Outcome: Progressing   Problem: Activity: Goal: Risk for activity intolerance will decrease Outcome: Progressing   Problem: Coping: Goal: Level of anxiety will decrease Outcome: Progressing   Problem: Elimination: Goal: Will not experience complications related to bowel motility Outcome: Progressing   Problem: Pain Managment: Goal: General experience of comfort will improve Outcome: Progressing   Problem: Safety: Goal: Ability to remain free from injury will improve Outcome: Progressing   Problem: Skin Integrity: Goal: Risk for impaired skin integrity will decrease Outcome: Progressing   

## 2021-01-13 NOTE — Consult Note (Signed)
WOC Nurse Consult Note: Patient receiving care in [facility] [phone]. Outside Care Attendant in room. Reason for Consult: sacral wound Wound type: DTPI to coccyx Pressure Injury POA: No Measurement: 2 cm x 1 cm Wound bed: purple with intact overlying tissue Drainage (amount, consistency, odor) none Periwound: intact Dressing procedure/placement/frequency: Notify the MD and the Foley nurses if the patient's coccyx wound becomes any color other than pink or it's current color which is purple. Continue the use of a foam dressing to the wound area. I have also added an order for a standard size bed with air mattress. Monitor the wound area(s) for worsening of condition such as: Signs/symptoms of infection,  Increase in size,  Development of or worsening of odor, Development of pain, or increased pain at the affected locations.  Notify the medical team if any of these develop.  Thank you for the consult.  Val Riles, RN, MSN, CWOCN, CNS-BC, pager [phone]

## 2021-01-13 NOTE — Progress Notes (Signed)
DISCHARGE NOTE [name] to be discharged Skilled nursing facility per MD order. Discussed prescriptions and follow up appointments with the patient. Prescriptions given to patient; medication list explained in detail. Patient verbalized understanding.  Skin clean, dry and intact without evidence of skin break down, no evidence of skin tears noted. IV catheter discontinued intact. Site without signs and symptoms of complications. Dressing and pressure applied. Pt denies pain at the site currently. No complaints noted.  Discharged with foley catheter.   An After Visit Summary (AVS) was printed and given to the PTAR to be given to [facility]. Patient escorted via stretcher, and discharged to [facility] via [place].  Vira Agar, RN

## 2021-01-13 NOTE — TOC Transition Note (Signed)
Transition of Care [facility]) - CM/SW Discharge Note Discharged to [facility] # for Report - [phone]   Patient Details  Name: Jeffery Rogers MRN: [id number] Date of Birth: Jan 05, 1932  Transition of Care [facility]) CM/SW Contact:  Sable Feil, LCSW Phone Number: 01/13/2021, 11:15 AM   Clinical Narrative: Patient medically stable and returning today to [facility] SNF where he is a LTC resident. Mrs. Dray contacted regarding today's discharge [phone]). Contact made with Yates Decamp, SW at [facility] [phone]) regarding patient discharge and transmission of discharge summary and initial referral.        Final next level of care: [place] ([facility]) Barriers to Discharge: Barriers Resolved   Patient Goals and CMS Choice Patient states their goals for this hospitalization and ongoing recovery are:: Per wife, patient will return to [facility] SN[facility] CMS Medicare.gov Compare Post Acute Care list provided to:: Other (Comment Required) (Not needed as patient returning to [place] SNF) Choice offered to / list presented to : NA  Discharge Placement PASRR number recieved: 01/12/21            Patient chooses bed at: [facility] Patient to be transferred to facility by: Non-emergency ambulance transport Name of family member notified: Jeffery Rogers - wife; [phone] Patient and family notified of of transfer: 01/13/21  Discharge Plan and Services In-house Referral: Clinical Social Work                                  Social Determinants of Health ([place]) Interventions  No SDOH interventions requested or needed at discharge   Readmission Risk Interventions Readmission Risk Prevention Plan 06/01/2018  Post Dischage Appt Complete  Medication Screening Complete  Transportation Screening Complete  PCP follow-up Complete  Some recent data might be hidden

## 2021-01-13 NOTE — Progress Notes (Signed)
Report attempted x1 to [place] at [phone], transferred to [facility] and left a message for a call back.    Vira Agar, RN

## 2021-01-14 ENCOUNTER — Non-Acute Institutional Stay (SKILLED_NURSING_FACILITY): Payer: Medicare Other | Admitting: Nurse Practitioner

## 2021-01-14 DIAGNOSIS — R1310 Dysphagia, unspecified: Secondary | ICD-10-CM | POA: Diagnosis not present

## 2021-01-14 DIAGNOSIS — I4819 Other persistent atrial fibrillation: Secondary | ICD-10-CM | POA: Diagnosis not present

## 2021-01-14 DIAGNOSIS — R627 Adult failure to thrive: Secondary | ICD-10-CM | POA: Diagnosis not present

## 2021-01-14 DIAGNOSIS — L89150 Pressure ulcer of sacral region, unstageable: Secondary | ICD-10-CM

## 2021-01-14 DIAGNOSIS — I350 Nonrheumatic aortic (valve) stenosis: Secondary | ICD-10-CM

## 2021-01-14 DIAGNOSIS — F0391 Unspecified dementia with behavioral disturbance: Secondary | ICD-10-CM | POA: Diagnosis not present

## 2021-01-14 DIAGNOSIS — Z978 Presence of other specified devices: Secondary | ICD-10-CM | POA: Diagnosis not present

## 2021-01-14 DIAGNOSIS — I10 Essential (primary) hypertension: Secondary | ICD-10-CM | POA: Diagnosis not present

## 2021-01-14 DIAGNOSIS — A4102 Sepsis due to Methicillin resistant Staphylococcus aureus: Secondary | ICD-10-CM | POA: Diagnosis not present

## 2021-01-14 DIAGNOSIS — F0394 Unspecified dementia, unspecified severity, with anxiety: Secondary | ICD-10-CM

## 2021-01-14 NOTE — Progress Notes (Signed)
Location:   S[facility] Room Number: 32-A Place of Service:  SNF (31) Provider: Lennie Odor Francina Beery NP  Jeffery Pretty, MD  Patient Care Team: Jeffery Pretty, MD as PCP - General (Internal Medicine) Jeffery Lance, MD as PCP - Electrophysiology (Cardiology) Jeffery Blanks, MD as PCP - Structural Heart (Cardiology) Jeffery Blanks, MD as PCP - Cardiology (Cardiology) Jeffery Fam, MD as Consulting Physician (Ophthalmology)  Extended Emergency Contact Information Primary Emergency Contact: Jeffery Rogers Address: [address]          [place], [place] [address] Johnnette Litter of [place] Phone: [phone] Relation: Spouse Secondary Emergency Contact: Jeffery Rogers [place] of [place] Mobile Phone: [phone] Relation: Daughter Interpreter needed? No  Code Status: DNR Goals of care: Advanced Directive information Advanced Directives 01/15/2021  Does Patient Have a Medical Advance Directive? Yes  Type of Advance Directive Living will;Out of facility DNR (pink MOST or yellow form)  Does patient want to make changes to medical advance directive? No - Patient declined  Copy of [place] in Chart? -  Would patient like information on creating a medical advance directive? -  Pre-existing out of facility DNR order (yellow form or pink MOST form) -     Chief Complaint  Patient presents with  . Readmit To SNF    Medication management/Stage 2 pressure sacrum    HPI:  Pt is a 87 y.o. male seen today for an acute visit for medication review following hospital stay.   Hospitalized 01/06/21-01/13/21 for MRSA bacteremia/UTI due to urinary indwelling Foley Cath, ID consulted, stopped ABT, desires Hospice service region. Wbc 9.7 01/10/21  Dysphagia, nectar thick, risk for aspiration  Dementia, advanced.   Depression/anxiety, restless, takes Seroquel, Xanax, Clonazepam  Heart black/severer aortic stenosis with TAVR and PPM.  01/09/21 Echocardiogram EF 60-65%  Afib, heart rate is in control, no anticoagulation. EKG 01/07/21 junctional rhythm.   HTN, off Amlodipine. Bun/creat 30/0.77 01/10/21  Urinary retention, Foley chronic  Pressure ulcer unstageable,  sacra coccygeal   MRSA bacteremia/UTI due to urinary indwelling Foley Cath, ID consulted, stopped ABT, desires Hospice service region.    Adult failure to thrive, comfort measures, Morphine, Lorazepam, Clonazepam, Alprazolam available to him.   Past Medical History:  Diagnosis Date  . Arthritis    "right arm/shoulder" (02/10/2016)  . Complete heart block ([place]) 02/10/2016   a. s/p Emergency planning/management officer  . Goiter   . Hypercholesteremia   . Memory loss   . Nasal septal deviation   . Persistent atrial fibrillation ([facility])   . Presence of permanent cardiac pacemaker   . 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 Severe aortic stenosis   . Tremor    Past Surgical History:  Procedure Laterality Date  . CATARACT EXTRACTION W/ INTRAOCULAR LENS  IMPLANT, BILATERAL Bilateral ~ 2000  . EP IMPLANTABLE DEVICE N/A 02/11/2016   Procedure: Pacemaker Implant;  Surgeon: Jeffery Lance, MD;  Location: [facility];  Service: Cardiovascular;  Laterality: N/A;  . RIGHT/LEFT HEART CATH AND CORONARY ANGIOGRAPHY N/A 05/03/2018   Procedure: RIGHT/LEFT HEART CATH AND CORONARY ANGIOGRAPHY;  Surgeon: Jeffery Blanks, MD;  Location: [facility];  Service: Cardiovascular;  Laterality: N/A;  . TEE WITHOUT CARDIOVERSION N/A 05/30/2018   Procedure: TRANSESOPHAGEAL ECHOCARDIOGRAM (TEE);  Surgeon: Jeffery Blanks, MD;  Location: [facility];  Service: Open  Heart Surgery;  Laterality: N/A;  . TONSILLECTOMY  1930s  . TRANSCATHETER AORTIC VALVE REPLACEMENT, TRANSFEMORAL N/A 05/30/2018   Procedure: TRANSCATHETER AORTIC VALVE REPLACEMENT, TRANSFEMORAL using a  73mm Edwards Sapien 3 Aortic Valve;  Surgeon: Jeffery Blanks, MD;  Location: [facility];  Service: Open Heart Surgery;  Laterality: N/A;  . WISDOM TOOTH EXTRACTION  1990s    No Known Allergies  Allergies as of 01/14/2021   No Known Allergies     Medication List       Accurate as of January 14, 2021 11:59 PM. If you have any questions, ask your nurse or doctor.        STOP taking these medications   mirtazapine 15 MG tablet Commonly known as: REMERON   polyvinyl alcohol 1.4 % ophthalmic solution Commonly known as: LIQUIFILM TEARS   saccharomyces boulardii 250 MG capsule Commonly known as: FLORASTOR   tamsulosin 0.4 MG Caps capsule Commonly known as: FLOMAX     TAKE these medications   acetaminophen 500 MG tablet Commonly known as: TYLENOL Take 1,000 mg by mouth daily.   ALPRAZolam 0.25 MG tablet Commonly known as: XANAX Take 0.25 mg by mouth 3 (three) times daily as needed for anxiety.   clonazePAM 0.5 MG tablet Commonly known as: KLONOPIN Take 1 tablet (0.5 mg total) by mouth 2 (two) times daily.   ipratropium 0.03 % nasal spray Commonly known as: ATROVENT Place 2 sprays into both nostrils 4 (four) times daily as needed for rhinitis.   morphine 20 MG/ML concentrated solution Commonly known as: ROXANOL Take 0.25 mLs (5 mg total) by mouth every 2 (two) hours as needed for severe pain.   QUEtiapine 100 MG tablet Commonly known as: SEROQUEL Take 1 tablet (100 mg total) by mouth 2 (two) times daily.      ROS was provided with assistance of staff.  Review of Systems  Constitutional: Positive for fatigue. Negative for fever.  HENT: Positive for hearing loss and trouble swallowing. Negative for congestion and voice change.        Nectar thick liquids.   Respiratory: Negative for cough and shortness of breath.   Cardiovascular: Negative for leg swelling.  Gastrointestinal: Negative for abdominal pain and constipation.  Genitourinary: Positive for difficulty  urinating. Negative for hematuria.       Foley.   Musculoskeletal: Positive for arthralgias, back pain and gait problem.  Skin: Positive for wound. Negative for color change.  Neurological: Positive for tremors and speech difficulty. Negative for weakness and headaches.       Dementia, expressive aphasia.   Psychiatric/Behavioral: Positive for behavioral problems, confusion and sleep disturbance. Negative for hallucinations. The patient is nervous/anxious.        Restlessness    Immunization History  Administered Date(s) Administered  . Influenza, High Dose Seasonal PF 08/01/2014, 06/16/2015, 06/17/2016  . Influenza, Quadrivalent, Recombinant, Inj, Pf 07/26/2017, 07/21/2018, 06/08/2019, 07/02/2020  . Influenza-Unspecified 07/20/2013  . Pneumococcal Conjugate-13 01/16/2013  . Pneumococcal Polysaccharide-23 01/07/1999  . Td 11/13/2018  . Tdap 01/11/2012  . Zoster 09/07/2006  . Zoster Recombinat (Shingrix) 02/27/2019, 02/27/2019   Pertinent  Health Maintenance Due  Topic Date Due  . INFLUENZA VACCINE  05/04/2021  . PNA vac Low Risk Adult  Completed   Fall Risk  03/06/2019  Falls in the past year? 1  Number falls in past yr: 1  Injury with Fall? 0   Functional Status Survey:    Vitals:   01/15/21 0859  BP: 120/60  Pulse: 60  Resp:  20  Temp: 98.3 F (36.8 C)  SpO2: 91%  Weight: 153 lb 1.6 oz (69.4 kg)  Height: 6\' 1"  (1.854 m)   Body mass index is 20.2 kg/m. Physical Exam Vitals and nursing note reviewed.  HENT:     Head: Normocephalic and atraumatic.     Mouth/Throat:     Mouth: Mucous membranes are moist.  Eyes:     Extraocular Movements: Extraocular movements intact.     Conjunctiva/sclera: Conjunctivae normal.     Pupils: Pupils are equal, round, and reactive to light.  Cardiovascular:     Rate and Rhythm: Normal rate. Rhythm irregular.     Heart sounds: Murmur heard.    Pulmonary:     Effort: Pulmonary effort is normal.     Breath sounds: No rales.   Abdominal:     General: Bowel sounds are normal.     Palpations: Abdomen is soft.     Tenderness: There is no abdominal tenderness.  Genitourinary:    Comments: Foley. Penile redness, swelling, blood blots seen around catheter, blood in urine in Foley drainage bag.   Musculoskeletal:     Cervical back: Normal range of motion and neck supple.     Right lower leg: No edema.     Left lower leg: No edema.  Skin:    General: Skin is warm and dry.     Comments: Pressure ulcer sacral region, unstageable, no active bleeding or s/s of infection.   Neurological:     General: No focal deficit present.     Mental Status: He is alert. Mental status is at baseline.     Motor: No weakness.     Coordination: Coordination abnormal.     Gait: Gait abnormal.     Comments: Oriented to self. Restlessness in bed, mumbles.   Psychiatric:     Comments: Mumbles, restlessness in bed, confusion.      Labs reviewed: Recent Labs    01/07/21 0700 01/08/21 0414 01/10/21 0355  NA 141 141 144  K 4.4 3.9 3.5  CL 107 110 112*  CO2 25 25 24   GLUCOSE 119* 105* 107*  BUN 24* 31* 30*  CREATININE 0.95 0.87 0.77  CALCIUM 8.8* 8.8* 8.9  MG 2.1  --   --   PHOS 3.5  --   --    Recent Labs    11/22/20 0514 12/23/20 0000 01/06/21 1624 01/07/21 0700  AST 55* 72* 41 43*  ALT 40 39 35 35  ALKPHOS 83 101 100 90  BILITOT 0.9  --  0.7 0.7  PROT 6.2*  --  6.8 6.6  ALBUMIN 3.4* 3.5 2.9* 2.6*   Recent Labs    12/23/20 0000 01/06/21 1624 01/07/21 0700 01/08/21 0414 01/10/21 0355  WBC 16.3 13.5* 10.2 10.9* 9.7  NEUTROABS 14,116.00 11.7* 8.4*  --   --   HGB 12.7* 10.7* 11.2* 11.2* 12.8*  HCT 39* 33.5* 35.2* 35.1* 39.9  MCV  --  96.0 96.4 94.6 93.4  PLT 188 271 233 189 240   Lab Results  Component Value Date   TSH 0.713 01/07/2021   Lab Results  Component Value Date   HGBA1C 5.8 (H) 11/22/2020   Lab Results  Component Value Date   CHOL 127 11/22/2020   HDL 52 11/22/2020   LDLCALC 65  11/22/2020   TRIG 50 11/22/2020   CHOLHDL 2.4 11/22/2020    Significant Diagnostic Results in last 30 days:  ECHOCARDIOGRAM LIMITED  Result Date: 01/09/2021    ECHOCARDIOGRAM  LIMITED REPORT   Patient Name:   Jeffery Rogers Date of Exam: 01/09/2021 Medical Rec #:  [id number]       Height:       73.0 in Accession #:    [id number]      Weight:       160.0 lb Date of Birth:  January 31, 1932        BSA:          1.957 m Patient Age:    59 years        BP:           100/78 mmHg Patient Gender: M               HR:           61 bpm. Exam Location:  Inpatient Procedure: Limited Echo Indications:    Bacteremia R78.81  History:        Patient has prior history of Echocardiogram examinations, most                 recent 11/07/2020. Pacemaker, Arrythmias:Atrial Fibrillation; Risk                 Factors:Hypertension, Former Smoker and Dyslipidemia.                 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  Sonographer:    Leavy Cella Referring Phys: [id number]  1. Left ventricular ejection fraction, by estimation, is 60 to 65%. The left ventricle has normal function. The left ventricle has no regional wall motion abnormalities. Left ventricular diastolic parameters are indeterminate.  2. The mitral valve is degenerative. Mild to moderate mitral valve regurgitation. Severe mitral annular calcification.  3. The tricuspid valve is abnormal. Tricuspid valve regurgitation is moderate to severe.  4. There is a 23 mm Edwards Sapien prosthetic, stented (TAVR) valve present in the aortic position. Aortic valve regurgitation is mild to moderate. Aortic valve mean gradient measures 18.0 mmHg. FINDINGS  Left Ventricle: Left ventricular ejection fraction, by estimation, is 60 to 65%. The left ventricle has normal function. The left ventricle has no regional wall motion  abnormalities. Left ventricular diastolic parameters are indeterminate. Pericardium: There is no evidence of pericardial effusion. Mitral Valve: The mitral valve is degenerative in appearance. Severe mitral annular calcification. Mild to moderate mitral valve regurgitation. Tricuspid Valve: The tricuspid valve is abnormal. Tricuspid valve regurgitation is moderate to severe. Aortic Valve: The aortic valve has been repaired/replaced. Aortic valve regurgitation is mild to moderate. Aortic valve mean gradient measures 18.0 mmHg. There is a 23 mm Edwards Sapien prosthetic, stented (TAVR) valve present in the aortic position. AORTIC VALVE AV Mean Grad: 18.0 mmHg MITRAL VALVE                TRICUSPID VALVE MV Area (PHT): 1.87 cm     TR Peak grad:   42.8 mmHg MV Decel Time: 405 msec     TR Vmax:        327.00 cm/s MV E velocity: 172.00 cm/s MV A velocity: 63.50 cm/s MV E/A ratio:  2.71 Oswaldo Milian MD Electronically signed by Oswaldo Milian MD Signature Date/Time: 01/09/2021/3:18:27 PM    Final  Assessment/Plan: Pressure injury of skin Sacral region,  Unstageable, assist the patient with frequent repositioning, Hydrocolloid dressing  Sepsis due to methicillin resistant Staphylococcus aureus (MRSA) ([facility]) MRSA bacteremia/UTI due to urinary indwelling Foley Cath, ID consulted, stopped ABT, desires Hospice service region.   Adult failure to thrive Adult failure to thrive, comfort measures, Morphine, Lorazepam, Clonazepam, Alprazolam available to him.     Chronic indwelling Foley catheter Foley chronic, failed voiding trials, dc Flomax-simplifying meds/comfort care  HTN (hypertension) Blood pressure is controlled, off Amlodipine. Bun/creat 30/0.77 01/10/21   Persistent atrial fibrillation ([facility]) Afib, heart rate is in control, no anticoagulation. EKG 01/07/21 junctional rhythm.   Severe aortic stenosis Heart black/severer aortic stenosis with TAVR and PPM. 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  Advanced dementia   Dysphagia nectar thick, risk for aspiration    Family/ staff Communication: plan of care reviewed with the patient and charge nurse.   Labs/tests ordered:  None  Time spend 35 minutes.

## 2021-01-15 ENCOUNTER — Encounter: Payer: Self-pay | Admitting: Nurse Practitioner

## 2021-01-15 DIAGNOSIS — I700 Atherosclerosis of aorta: Secondary | ICD-10-CM | POA: Diagnosis not present

## 2021-01-15 DIAGNOSIS — R269 Unspecified abnormalities of gait and mobility: Secondary | ICD-10-CM | POA: Diagnosis not present

## 2021-01-15 DIAGNOSIS — E785 Hyperlipidemia, unspecified: Secondary | ICD-10-CM | POA: Diagnosis not present

## 2021-01-15 DIAGNOSIS — G250 Essential tremor: Secondary | ICD-10-CM | POA: Diagnosis not present

## 2021-01-15 DIAGNOSIS — I10 Essential (primary) hypertension: Secondary | ICD-10-CM | POA: Diagnosis not present

## 2021-01-15 DIAGNOSIS — F0151 Vascular dementia with behavioral disturbance: Secondary | ICD-10-CM | POA: Diagnosis not present

## 2021-01-15 DIAGNOSIS — I4819 Other persistent atrial fibrillation: Secondary | ICD-10-CM | POA: Diagnosis not present

## 2021-01-15 DIAGNOSIS — G301 Alzheimer's disease with late onset: Secondary | ICD-10-CM | POA: Diagnosis not present

## 2021-01-15 DIAGNOSIS — A4102 Sepsis due to Methicillin resistant Staphylococcus aureus: Secondary | ICD-10-CM | POA: Diagnosis not present

## 2021-01-15 DIAGNOSIS — F0281 Dementia in other diseases classified elsewhere with behavioral disturbance: Secondary | ICD-10-CM | POA: Diagnosis not present

## 2021-01-15 DIAGNOSIS — J300 Vasomotor rhinitis: Secondary | ICD-10-CM | POA: Diagnosis not present

## 2021-01-15 DIAGNOSIS — Z954 Presence of other heart-valve replacement: Secondary | ICD-10-CM | POA: Diagnosis not present

## 2021-01-15 DIAGNOSIS — R1310 Dysphagia, unspecified: Secondary | ICD-10-CM | POA: Diagnosis not present

## 2021-01-15 DIAGNOSIS — M48061 Spinal stenosis, lumbar region without neurogenic claudication: Secondary | ICD-10-CM | POA: Diagnosis not present

## 2021-01-15 DIAGNOSIS — Z6821 Body mass index (BMI) 21.0-21.9, adult: Secondary | ICD-10-CM | POA: Diagnosis not present

## 2021-01-15 DIAGNOSIS — R7303 Prediabetes: Secondary | ICD-10-CM | POA: Diagnosis not present

## 2021-01-15 DIAGNOSIS — E43 Unspecified severe protein-calorie malnutrition: Secondary | ICD-10-CM | POA: Diagnosis not present

## 2021-01-15 DIAGNOSIS — R627 Adult failure to thrive: Secondary | ICD-10-CM | POA: Diagnosis not present

## 2021-01-15 DIAGNOSIS — N390 Urinary tract infection, site not specified: Secondary | ICD-10-CM | POA: Diagnosis not present

## 2021-01-15 DIAGNOSIS — Z87891 Personal history of nicotine dependence: Secondary | ICD-10-CM | POA: Diagnosis not present

## 2021-01-15 DIAGNOSIS — H04123 Dry eye syndrome of bilateral lacrimal glands: Secondary | ICD-10-CM | POA: Diagnosis not present

## 2021-01-15 DIAGNOSIS — Z960 Presence of urogenital implants: Secondary | ICD-10-CM | POA: Diagnosis not present

## 2021-01-15 DIAGNOSIS — M1990 Unspecified osteoarthritis, unspecified site: Secondary | ICD-10-CM | POA: Diagnosis not present

## 2021-01-15 DIAGNOSIS — Z950 Presence of cardiac pacemaker: Secondary | ICD-10-CM | POA: Diagnosis not present

## 2021-01-15 DIAGNOSIS — E049 Nontoxic goiter, unspecified: Secondary | ICD-10-CM | POA: Diagnosis not present

## 2021-01-15 NOTE — Assessment & Plan Note (Addendum)
Foley chronic, failed voiding trials, dc Flomax-simplifying meds/comfort care

## 2021-01-15 NOTE — Assessment & Plan Note (Signed)
Adult failure to thrive, comfort measures, Morphine, Lorazepam, Clonazepam, Alprazolam available to him.

## 2021-01-15 NOTE — Assessment & Plan Note (Signed)
Afib, heart rate is in control, no anticoagulation. EKG 01/07/21 junctional rhythm.

## 2021-01-15 NOTE — Assessment & Plan Note (Addendum)
Depression/anxiety, restless, takes Seroquel,  Clonazepam, dc Xanax due to bitter taste, adding Lorazepam q2h prn for comfort measures.  Advanced dementia

## 2021-01-15 NOTE — Assessment & Plan Note (Signed)
nectar thick, risk for aspiration

## 2021-01-15 NOTE — Assessment & Plan Note (Signed)
MRSA bacteremia/UTI due to urinary indwelling Foley Cath, ID consulted, stopped ABT, desires Hospice service region.

## 2021-01-15 NOTE — Assessment & Plan Note (Signed)
Blood pressure is controlled, off Amlodipine. Bun/creat 30/0.77 01/10/21

## 2021-01-15 NOTE — Assessment & Plan Note (Signed)
Heart black/severer aortic stenosis with TAVR and PPM. 01/09/21 Echocardiogram EF 60-65%

## 2021-01-15 NOTE — Assessment & Plan Note (Signed)
Sacral region,  Unstageable, assist the patient with frequent repositioning, Hydrocolloid dressing

## 2021-01-16 DIAGNOSIS — F0281 Dementia in other diseases classified elsewhere with behavioral disturbance: Secondary | ICD-10-CM | POA: Diagnosis not present

## 2021-01-16 DIAGNOSIS — G301 Alzheimer's disease with late onset: Secondary | ICD-10-CM | POA: Diagnosis not present

## 2021-01-16 DIAGNOSIS — F0151 Vascular dementia with behavioral disturbance: Secondary | ICD-10-CM | POA: Diagnosis not present

## 2021-01-16 DIAGNOSIS — A4102 Sepsis due to Methicillin resistant Staphylococcus aureus: Secondary | ICD-10-CM | POA: Diagnosis not present

## 2021-01-16 DIAGNOSIS — Z960 Presence of urogenital implants: Secondary | ICD-10-CM | POA: Diagnosis not present

## 2021-01-16 DIAGNOSIS — N390 Urinary tract infection, site not specified: Secondary | ICD-10-CM | POA: Diagnosis not present

## 2021-01-18 DIAGNOSIS — Z960 Presence of urogenital implants: Secondary | ICD-10-CM | POA: Diagnosis not present

## 2021-01-18 DIAGNOSIS — G301 Alzheimer's disease with late onset: Secondary | ICD-10-CM | POA: Diagnosis not present

## 2021-01-18 DIAGNOSIS — F0281 Dementia in other diseases classified elsewhere with behavioral disturbance: Secondary | ICD-10-CM | POA: Diagnosis not present

## 2021-01-18 DIAGNOSIS — A4102 Sepsis due to Methicillin resistant Staphylococcus aureus: Secondary | ICD-10-CM | POA: Diagnosis not present

## 2021-01-18 DIAGNOSIS — N390 Urinary tract infection, site not specified: Secondary | ICD-10-CM | POA: Diagnosis not present

## 2021-01-18 DIAGNOSIS — F0151 Vascular dementia with behavioral disturbance: Secondary | ICD-10-CM | POA: Diagnosis not present

## 2021-01-19 ENCOUNTER — Encounter: Payer: Self-pay | Admitting: Nurse Practitioner

## 2021-01-19 DIAGNOSIS — G301 Alzheimer's disease with late onset: Secondary | ICD-10-CM | POA: Diagnosis not present

## 2021-01-19 DIAGNOSIS — F0281 Dementia in other diseases classified elsewhere with behavioral disturbance: Secondary | ICD-10-CM | POA: Diagnosis not present

## 2021-01-19 DIAGNOSIS — F0151 Vascular dementia with behavioral disturbance: Secondary | ICD-10-CM | POA: Diagnosis not present

## 2021-01-19 DIAGNOSIS — N390 Urinary tract infection, site not specified: Secondary | ICD-10-CM | POA: Diagnosis not present

## 2021-01-19 DIAGNOSIS — Z960 Presence of urogenital implants: Secondary | ICD-10-CM | POA: Diagnosis not present

## 2021-01-19 DIAGNOSIS — A4102 Sepsis due to Methicillin resistant Staphylococcus aureus: Secondary | ICD-10-CM | POA: Diagnosis not present

## 2021-01-20 DIAGNOSIS — Z960 Presence of urogenital implants: Secondary | ICD-10-CM | POA: Diagnosis not present

## 2021-01-20 DIAGNOSIS — A4102 Sepsis due to Methicillin resistant Staphylococcus aureus: Secondary | ICD-10-CM | POA: Diagnosis not present

## 2021-01-20 DIAGNOSIS — G301 Alzheimer's disease with late onset: Secondary | ICD-10-CM | POA: Diagnosis not present

## 2021-01-20 DIAGNOSIS — F0281 Dementia in other diseases classified elsewhere with behavioral disturbance: Secondary | ICD-10-CM | POA: Diagnosis not present

## 2021-01-20 DIAGNOSIS — F0151 Vascular dementia with behavioral disturbance: Secondary | ICD-10-CM | POA: Diagnosis not present

## 2021-01-20 DIAGNOSIS — N390 Urinary tract infection, site not specified: Secondary | ICD-10-CM | POA: Diagnosis not present

## 2021-01-21 DIAGNOSIS — G301 Alzheimer's disease with late onset: Secondary | ICD-10-CM | POA: Diagnosis not present

## 2021-01-21 DIAGNOSIS — N390 Urinary tract infection, site not specified: Secondary | ICD-10-CM | POA: Diagnosis not present

## 2021-01-21 DIAGNOSIS — A4102 Sepsis due to Methicillin resistant Staphylococcus aureus: Secondary | ICD-10-CM | POA: Diagnosis not present

## 2021-01-21 DIAGNOSIS — F0281 Dementia in other diseases classified elsewhere with behavioral disturbance: Secondary | ICD-10-CM | POA: Diagnosis not present

## 2021-01-21 DIAGNOSIS — Z960 Presence of urogenital implants: Secondary | ICD-10-CM | POA: Diagnosis not present

## 2021-01-21 DIAGNOSIS — F0151 Vascular dementia with behavioral disturbance: Secondary | ICD-10-CM | POA: Diagnosis not present

## 2021-02-01 DEATH — deceased

## 2021-02-04 ENCOUNTER — Inpatient Hospital Stay: Payer: Medicare Other | Admitting: Infectious Diseases

## 2022-09-07 IMAGING — CT CT ABD-PELV W/ CM
2 of 5 series · 14 of 46 positions shown, 16 images · IV contrast (omnipaque)
Comparison: CT a chest, abdomen, and pelvis dated November 13, 2020.

CLINICAL DATA: Chest and left flank pain. Hypoxia and altered
mental status.

EXAM:
CT ANGIOGRAPHY CHEST
CT ABDOMEN AND PELVIS WITH CONTRAST
TECHNIQUE: Multidetector CT imaging of the chest was performed using the
standard protocol during bolus administration of intravenous
contrast. Multiplanar CT image reconstructions and MIPs were
obtained to evaluate the vascular anatomy. Multidetector CT imaging
of the abdomen and pelvis was performed using the standard protocol
during bolus administration of intravenous contrast.
CONTRAST:  100mL OMNIPAQUE IOHEXOL 350 MG/ML SOLN

[Series 2: axial st · axial · 0.74mm/px · z∈[+1073,+1508]mm · 11 of 101 slices shown, 13 images]
[im 7/101  soft-tissue]
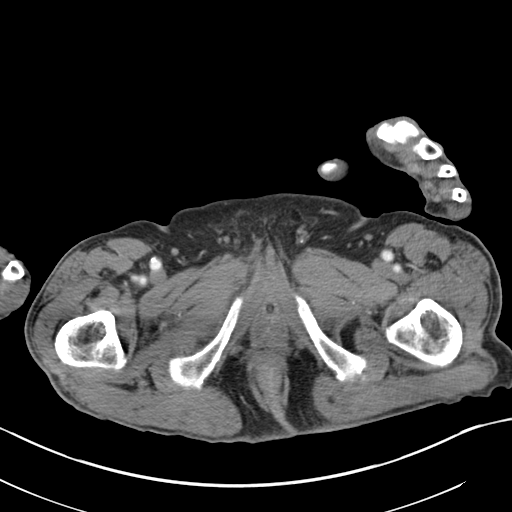
[im 7/101  bone]
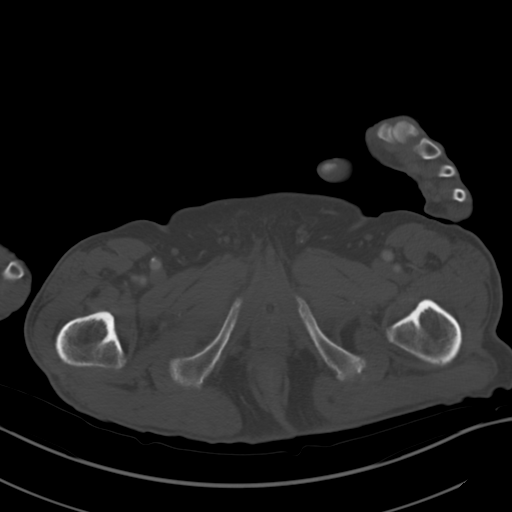
[im 14/101  soft-tissue]
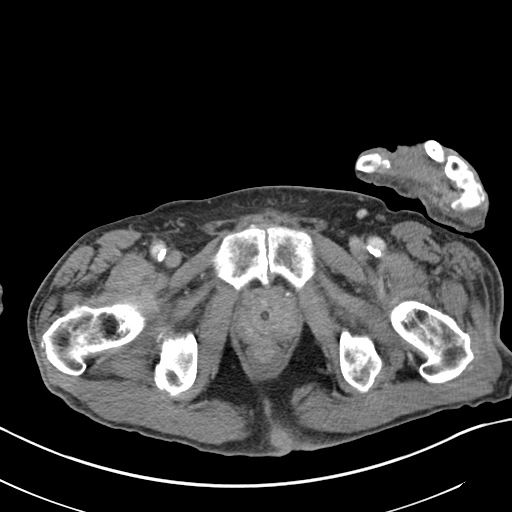
[im 27/101  soft-tissue]
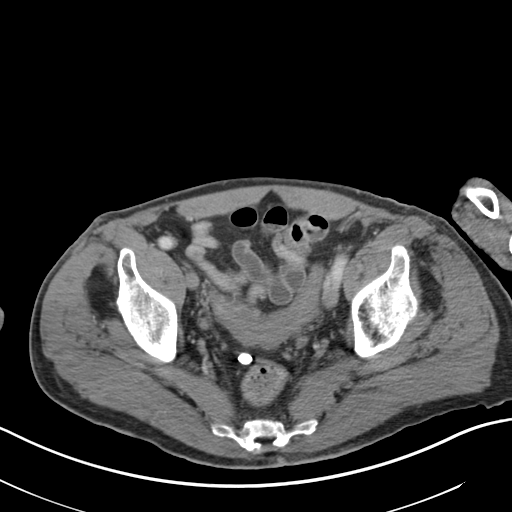
[im 34/101  soft-tissue]
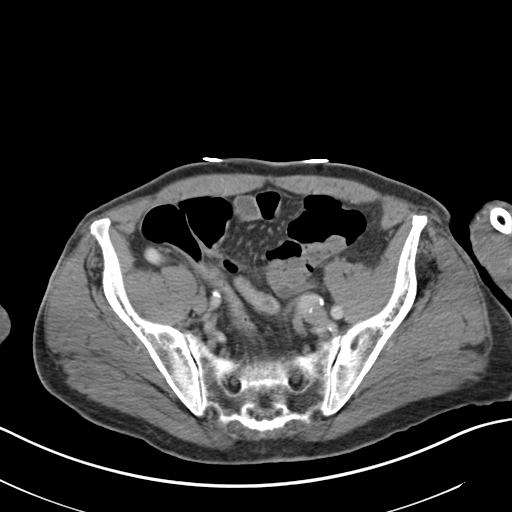
[im 41/101  soft-tissue]
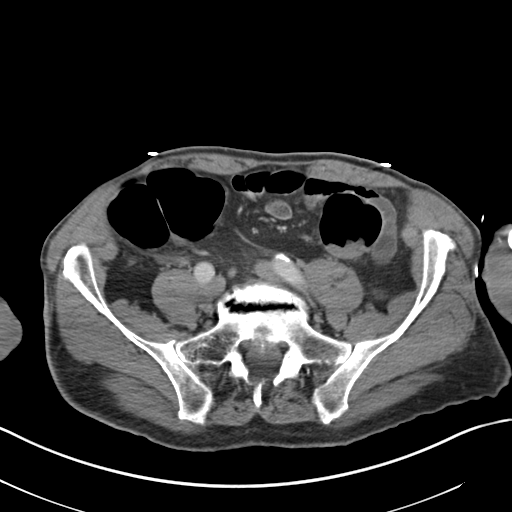
[im 54/101  soft-tissue]
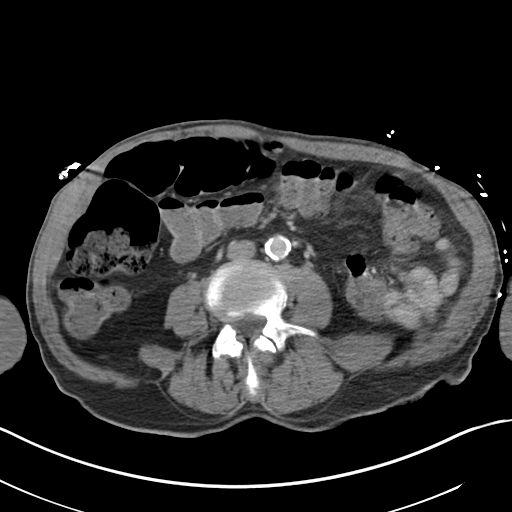
[im 61/101  soft-tissue]
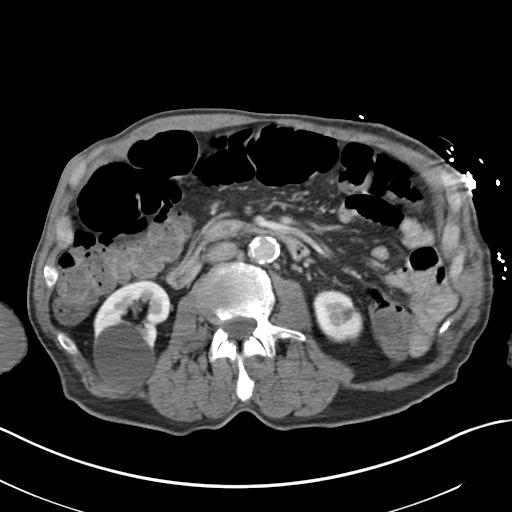
[im 67/101  soft-tissue]
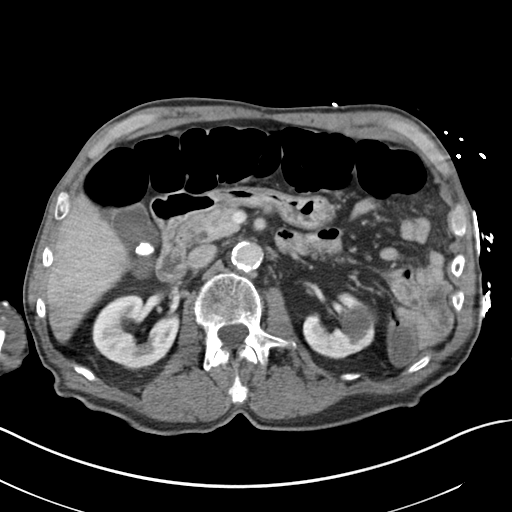
[im 74/101  soft-tissue]
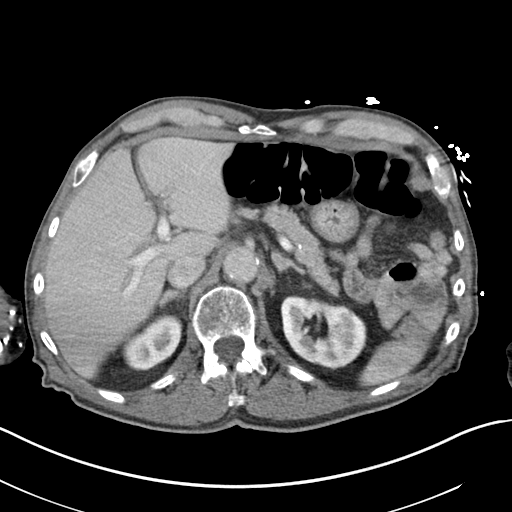
[im 74/101  bone]
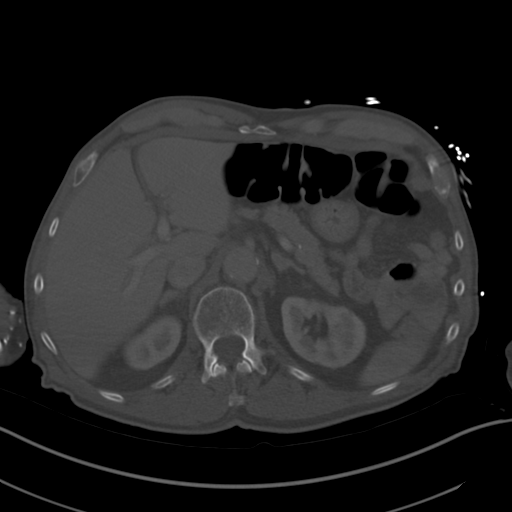
[im 87/101  soft-tissue]
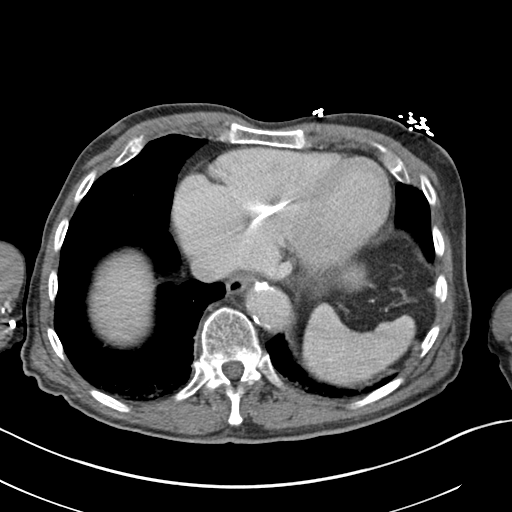
[im 94/101  soft-tissue]
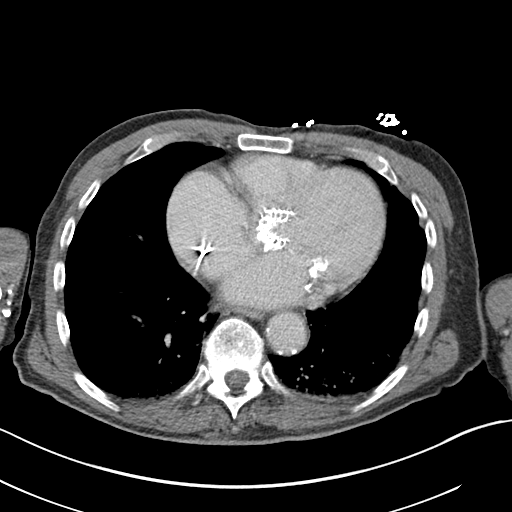

[Series 5: coronal st · coronal · 0.75mm/px · 3 of 95 slices shown]
[im 32/95  soft-tissue]
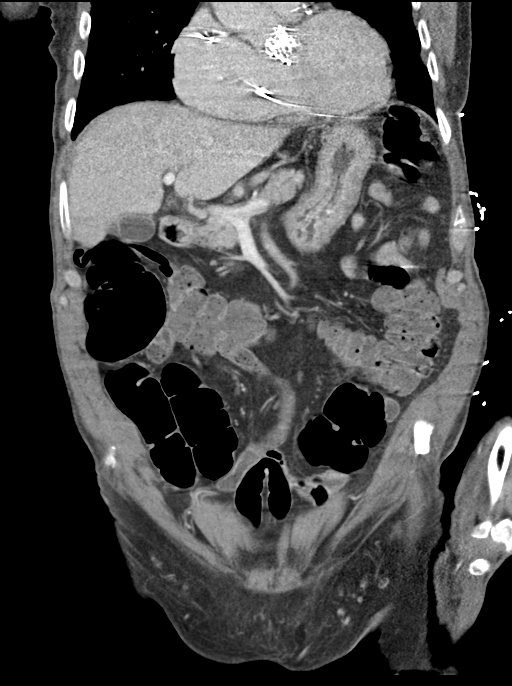
[im 42/95  soft-tissue]
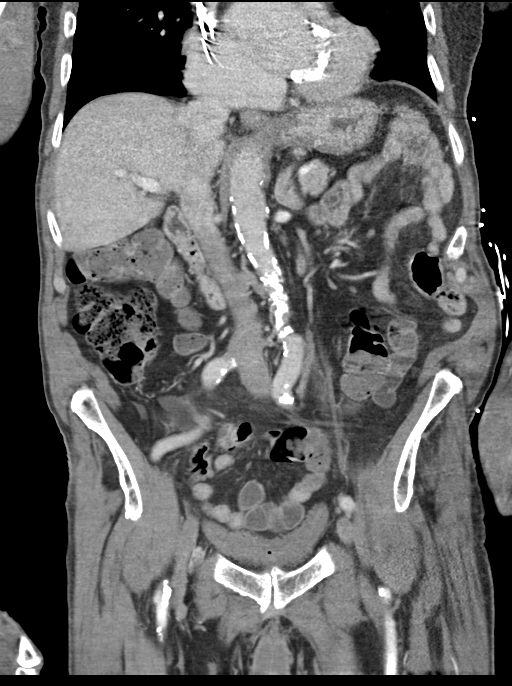
[im 53/95  soft-tissue]
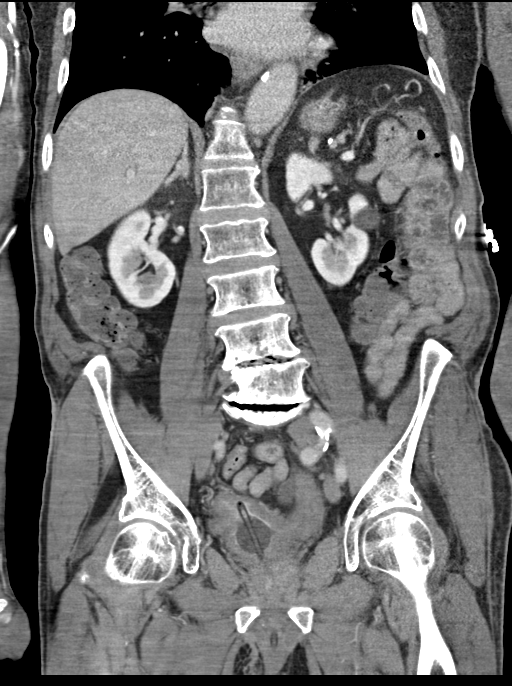

[14 of 46 positions shown; findings below may reference images not displayed]

FINDINGS: CTA CHEST FINDINGS

Cardiovascular: Satisfactory opacification of the pulmonary arteries
to the segmental level. No evidence of pulmonary embolism. Stable
cardiomegaly status post TAVR. No pericardial effusion. Unchanged
mild aneurysmal dilatation of the mid ascending thoracic aorta
measuring up to 4.0 cm. Coronary, aortic arch, and branch vessel
atherosclerotic vascular disease. Unchanged left chest wall
pacemaker.

Mediastinum/Nodes: No enlarged mediastinal, hilar, or axillary lymph
nodes. Unchanged 2.0 cm partially calcified hypodense nodule in the
right thyroid lobe This has been evaluated on previous imaging. The
trachea and esophagus demonstrate no significant findings.

Lungs/Pleura: Minimal dependent subsegmental atelectasis in both
lower lobes. No focal consolidation, pleural effusion, or
pneumothorax. No suspicious pulmonary nodule. Biapical
pleuroparenchymal scarring, greater on the right.

Musculoskeletal: No chest wall abnormality. No acute or significant
osseous findings.

Review of the MIP images confirms the above findings.

CT ABDOMEN AND PELVIS FINDINGS

Hepatobiliary: No focal liver abnormality. Unchanged gallstone. No
gallbladder wall thickening or biliary dilatation. Probable
adenomyomatosis at the gallbladder fundus.

Pancreas: Unremarkable. No pancreatic ductal dilatation or
surrounding inflammatory changes.

Spleen: Normal in size without focal abnormality.

Adrenals/Urinary Tract: Adrenal glands are unremarkable. Unchanged
bilateral renal cysts. No renal calculi or hydronephrosis. The
bladder is decompressed by Foley catheter.

Stomach/Bowel: Stomach is within normal limits. Appendix appears
normal. No evidence of bowel wall thickening, distention, or
inflammatory changes. Redundant sigmoid colon.

Vascular/Lymphatic: Aortic atherosclerosis. No enlarged abdominal or
pelvic lymph nodes.

Reproductive: Unchanged mild prostatomegaly.

Other: No abdominal wall hernia or abnormality. No abdominopelvic
ascites. No pneumoperitoneum.

Musculoskeletal: No acute or significant osseous findings.

Review of the MIP images confirms the above findings.
IMPRESSION: Chest:

1. No evidence of pulmonary embolism. No acute intrathoracic
process.
2. Unchanged mild aneurysmal dilatation of the mid ascending
thoracic aorta measuring up to 4.0 cm. Recommend annual imaging
followup by CTA or MRA. This recommendation follows 9919
ACCF/AHA/AATS/ACR/ASA/SCA/HOELSCHER/SOFIE/YAKIRA/PAULUS N Guidelines for the
Diagnosis and Management of Patients with Thoracic Aortic Disease.
Circulation. 9919; 121: E266-e369. Aortic aneurysm NOS (086KA-VIR.W)
3.  Aortic atherosclerosis (086KA-5S4.4).

Abdomen and pelvis:

1. No acute intra-abdominal process.
2. Unchanged cholelithiasis.
